# Patient Record
Sex: Female | Born: 1970
Health system: Southern US, Community
[De-identification: ages and names within clinical notes are randomized; demographics above are authoritative.]

## PROBLEM LIST (undated history)

## (undated) DIAGNOSIS — M1990 Unspecified osteoarthritis, unspecified site: Secondary | ICD-10-CM

## (undated) DIAGNOSIS — R519 Headache, unspecified: Secondary | ICD-10-CM

## (undated) DIAGNOSIS — K5792 Diverticulitis of intestine, part unspecified, without perforation or abscess without bleeding: Secondary | ICD-10-CM

## (undated) DIAGNOSIS — N920 Excessive and frequent menstruation with regular cycle: Secondary | ICD-10-CM

## (undated) DIAGNOSIS — D649 Anemia, unspecified: Secondary | ICD-10-CM

## (undated) DIAGNOSIS — M797 Fibromyalgia: Secondary | ICD-10-CM

## (undated) DIAGNOSIS — F17200 Nicotine dependence, unspecified, uncomplicated: Secondary | ICD-10-CM

## (undated) DIAGNOSIS — M940 Chondrocostal junction syndrome [Tietze]: Secondary | ICD-10-CM

## (undated) DIAGNOSIS — I10 Essential (primary) hypertension: Secondary | ICD-10-CM

## (undated) DIAGNOSIS — K219 Gastro-esophageal reflux disease without esophagitis: Secondary | ICD-10-CM

## (undated) DIAGNOSIS — Z8719 Personal history of other diseases of the digestive system: Secondary | ICD-10-CM

## (undated) DIAGNOSIS — L4050 Arthropathic psoriasis, unspecified: Secondary | ICD-10-CM

## (undated) DIAGNOSIS — N946 Dysmenorrhea, unspecified: Secondary | ICD-10-CM

## (undated) DIAGNOSIS — R51 Headache: Secondary | ICD-10-CM

## (undated) HISTORY — DX: Excessive and frequent menstruation with regular cycle: N92.0

## (undated) HISTORY — DX: Diverticulitis of intestine, part unspecified, without perforation or abscess without bleeding: K57.92

## (undated) HISTORY — DX: Unspecified osteoarthritis, unspecified site: M19.90

## (undated) HISTORY — PX: ABDOMINAL HYSTERECTOMY: SHX81

## (undated) HISTORY — DX: Nicotine dependence, unspecified, uncomplicated: F17.200

## (undated) HISTORY — DX: Dysmenorrhea, unspecified: N94.6

## (undated) HISTORY — DX: Chondrocostal junction syndrome (tietze): M94.0

## (undated) HISTORY — DX: Arthropathic psoriasis, unspecified: L40.50

## (undated) HISTORY — DX: Gastro-esophageal reflux disease without esophagitis: K21.9

## (undated) HISTORY — PX: CHOLECYSTECTOMY: SHX55

---

## 1996-05-12 HISTORY — PX: COLONOSCOPY: SHX174

## 1999-09-11 HISTORY — PX: ESOPHAGOGASTRODUODENOSCOPY: SHX1529

## 2001-01-13 ENCOUNTER — Other Ambulatory Visit: Admission: RE | Admit: 2001-01-13 | Discharge: 2001-01-13 | Payer: Self-pay | Admitting: *Deleted

## 2002-05-30 ENCOUNTER — Encounter (INDEPENDENT_AMBULATORY_CARE_PROVIDER_SITE_OTHER): Payer: Self-pay | Admitting: Specialist

## 2002-05-30 ENCOUNTER — Ambulatory Visit (HOSPITAL_BASED_OUTPATIENT_CLINIC_OR_DEPARTMENT_OTHER): Admission: RE | Admit: 2002-05-30 | Discharge: 2002-05-30 | Payer: Self-pay | Admitting: General Surgery

## 2003-04-13 ENCOUNTER — Other Ambulatory Visit: Admission: RE | Admit: 2003-04-13 | Discharge: 2003-04-13 | Payer: Self-pay | Admitting: Obstetrics and Gynecology

## 2003-11-26 ENCOUNTER — Emergency Department (HOSPITAL_COMMUNITY): Admission: EM | Admit: 2003-11-26 | Discharge: 2003-11-26 | Payer: Self-pay | Admitting: Emergency Medicine

## 2004-05-12 HISTORY — PX: CHOLECYSTECTOMY, LAPAROSCOPIC: SHX56

## 2004-08-29 ENCOUNTER — Emergency Department (HOSPITAL_COMMUNITY): Admission: EM | Admit: 2004-08-29 | Discharge: 2004-08-30 | Payer: Self-pay | Admitting: Emergency Medicine

## 2004-09-10 ENCOUNTER — Ambulatory Visit (HOSPITAL_COMMUNITY): Admission: RE | Admit: 2004-09-10 | Discharge: 2004-09-10 | Payer: Self-pay | Admitting: General Surgery

## 2004-09-10 ENCOUNTER — Encounter (INDEPENDENT_AMBULATORY_CARE_PROVIDER_SITE_OTHER): Payer: Self-pay | Admitting: *Deleted

## 2004-11-13 ENCOUNTER — Ambulatory Visit (HOSPITAL_COMMUNITY): Admission: RE | Admit: 2004-11-13 | Discharge: 2004-11-13 | Payer: Self-pay | Admitting: Family Medicine

## 2005-05-13 ENCOUNTER — Ambulatory Visit: Payer: Self-pay | Admitting: Internal Medicine

## 2005-05-20 ENCOUNTER — Ambulatory Visit (HOSPITAL_COMMUNITY): Admission: RE | Admit: 2005-05-20 | Discharge: 2005-05-20 | Payer: Self-pay | Admitting: Internal Medicine

## 2005-10-27 ENCOUNTER — Other Ambulatory Visit: Admission: RE | Admit: 2005-10-27 | Discharge: 2005-10-27 | Payer: Self-pay | Admitting: Obstetrics and Gynecology

## 2007-02-15 ENCOUNTER — Other Ambulatory Visit: Admission: RE | Admit: 2007-02-15 | Discharge: 2007-02-15 | Payer: Self-pay | Admitting: Obstetrics and Gynecology

## 2007-10-26 ENCOUNTER — Ambulatory Visit: Payer: Self-pay | Admitting: Internal Medicine

## 2008-10-03 ENCOUNTER — Encounter: Payer: Self-pay | Admitting: Gastroenterology

## 2009-03-27 LAB — HM PAP SMEAR: HM Pap smear: NEGATIVE

## 2009-08-30 ENCOUNTER — Encounter: Payer: Self-pay | Admitting: Urgent Care

## 2009-12-11 ENCOUNTER — Encounter: Payer: Self-pay | Admitting: Gastroenterology

## 2009-12-11 ENCOUNTER — Ambulatory Visit: Payer: Self-pay | Admitting: Internal Medicine

## 2009-12-11 DIAGNOSIS — K219 Gastro-esophageal reflux disease without esophagitis: Secondary | ICD-10-CM | POA: Insufficient documentation

## 2009-12-11 DIAGNOSIS — R197 Diarrhea, unspecified: Secondary | ICD-10-CM | POA: Insufficient documentation

## 2010-02-10 ENCOUNTER — Emergency Department (HOSPITAL_COMMUNITY): Admission: EM | Admit: 2010-02-10 | Discharge: 2010-02-10 | Payer: Self-pay | Admitting: Emergency Medicine

## 2010-02-12 ENCOUNTER — Ambulatory Visit (HOSPITAL_COMMUNITY): Admission: RE | Admit: 2010-02-12 | Discharge: 2010-02-12 | Payer: Self-pay | Admitting: Orthopaedic Surgery

## 2010-06-11 NOTE — Assessment & Plan Note (Signed)
Summary: fu med refill/jbb   Visit Type:  Follow-up Visit Primary Care Farhad Burleson:   [facility]  Chief Complaint:  Refill on meds.  History of Present Illness: Dawn Hayes is a pleasant 40 y/o WF with h/o chronic GERD who presents for f/u. Last seen in 2009. She is doing well on Nexium. Takes one two times a day most days. If she misses second dose, she has breakthrough symptoms in early-mid am. Denies n/v, abd pain, dysphagia. She c/o intermittent loose stools. Occuring fairly frequently. No constipation. No melena, brbpr. H/O vit D deficiency.     Current Medications (verified): 1)  Nexium 40 Mg Cpdr (Esomeprazole Magnesium) .... One By Mouth 1 To 2 Times Daily (30 Mins Before A Meal) 2)  Clobex 0.05 % Lotn (Clobetasol Propionate) .... As Needed 3)  Ibuprofen 200 Mg Tabs (Ibuprofen) .... As Needed  Allergies: 1)  ! Aspirin  Past History:  Past Medical History: Diverticulitis GERD H/O vit D def EGD 5/01-->mild erosive reflux esophagitis TCS remote Psoriatitic arthritis  Past Surgical History: Cholecystectomy, 06   Family History: Maternal GM, colon cancer, over 19 Father, deceased, brain cancer, prostate ca, lung ca Mother, healthy  Social History: Married. No children. 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 One glass wine or mixed drinks some evenings.  Review of Systems      See HPI  Vital Signs:  Patient profile:   40 year old female Height:      62 inches Weight:      225 pounds BMI:     41.30 Temp:     98.2 degrees F oral Pulse rate:   80 / minute BP sitting:   120 / 88  (left arm) Cuff size:   large  Vitals Entered By: Cloria Spring LPN (December 11, 2009 9:11 AM)  Physical Exam  General:  Well developed, well nourished, no acute distress. Head:  Normocephalic and atraumatic. Eyes:  sclera nonicteric Mouth:  op moist Lungs:  Clear throughout to auscultation. Heart:  Regular rate and rhythm; no murmurs, rubs,  or bruits. Abdomen:  Bowel  sounds normal.  Abdomen is soft, nontender, nondistended.  No rebound or guarding.  No hepatosplenomegaly, masses or hernias.  No abdominal bruits. obese.   Extremities:  No clubbing, cyanosis, edema or deformities noted. Neurologic:  Alert and  oriented x4;  grossly normal neurologically. Skin:  Intact without significant lesions or rashes. Psych:  Alert and cooperative. Normal mood and affect.  Impression & Recommendations:  Problem # 1:  GERD (ICD-530.81) Doing well. Encourage her to try and lose 20 pounds. May be able to get back to once daily dosing if she does. Smoking cessation will help as well. New RX provided. OV in 2 years or sooner if needed. Orders: Est. Patient Level II ([address])  Problem # 2:  DIARRHEA, CHRONIC (ICD-787.91) Likely secondary to IBS vs s/p cholecystectomy. Pt interested in being screened for celiac. Orders: T-igA (60454) T-Tissue Transglutamase Ab IgA (09811-91478) Est. Patient Level II ([address]) Prescriptions: NEXIUM 40 MG CPDR (ESOMEPRAZOLE MAGNESIUM) one by mouth 1 to 2 times daily (30 mins before a meal)  #60 x 11   Entered and Authorized by:   Leanna Battles. Dixon Boos   Signed by:   Leanna Battles Lewis PA-C on 12/11/2009   Method used:   Electronically to        [facility] [address] [phone]* (retail)       [address] [place], [place]  [address]       Ph: [phone]       Fax: [phone]   RxID:   [id number]   Appended Document: fu med refill/jbb REMINDER APPOINTMENT FOR 42YR F/U IN THE COMPUTER

## 2010-06-11 NOTE — Medication Information (Signed)
Summary: PA form nexium  PA form nexium   Imported By: Hendricks Limes LPN 13/12/6576 46:96:29  _____________________________________________________________________  External Attachment:    Type:   Image     Comment:   External Document

## 2010-09-24 NOTE — Assessment & Plan Note (Signed)
NAMEMarland Kitchen  Dawn, Hayes               CHART#:  [id number]   DATE:  10/26/2007                       DOB:  October 16, 1970   CHIEF COMPLAINT:  Followup of acid reflux.   SUBJECTIVE:  The patient is here for followup visit.  She is out of  Nexium and is requesting refill.  She was last seen in January 2007.  Therefore, she was asked to come in for evaluation.  She has a history  of chronic GERD.  She had been doing very well on Nexium 40 mg daily,  except for would have to take an additional dose about 2 times weekly.  Since she has been on her Nexium, she has had a flare-up of her reflux.  She tried Tagamet without result for about 2 weeks.  She has been taking  Pepcid 3 times a day.  She denies any dysphagia, odynophagia, nausea,  vomiting, early satiety, abdominal pain, constipation, diarrhea, melena,  or rectal bleeding.  She denies any caffeine use.  She continues to  smoke half pack of cigarettes daily.  She is exercising, trying to lose  weight, but has not been successful.  She has been working out for about  2 months.  She is trying to follow antireflux measures as discussed with  her today.   The patient inquires about need for colorectal cancer screening.  She  states her maternal grandmother died in her 21s with colon cancer.  She  is not sure if her mother has had any polyps in the past, but has had  colonoscopy.  No first-degree relatives with colonoscopies.  She had no  first-degree relatives who have had colon cancer, however.   CURRENT MEDICATIONS:  1. Nexium 40 mg daily.  2. Clobex lotion daily.  3. Ibuprofen p.r.n., but does not take frequently.   ALLERGIES:  Aspirin.   PHYSICAL EXAMINATION:  VITAL SIGNS:  Weight 217 pounds, height 5 feet 1-  1/2 inches, temp 97.7, blood pressure 104/70, and pulse 76.  GENERAL:  Pleasant, obese Caucasian female in no acute distress.  SKIN:  Warm and dry.  No jaundice.  HEENT:  Sclerae nonicteric.  Oropharyngeal mucosa moist and  pink.  CARDIAC:  Regular rate and rhythm.  ABDOMEN:  Obese, positive bowel sounds, soft, nontender, and  nondistended.  No organomegaly or masses.  No rebound or guarding.   IMPRESSION:  Gastroesophageal reflux disease.  For the most part, well  controlled on Nexium.  She has to take an occasional additional dose  about once to twice a week.  Family history of colorectal cancer in a  second-degree relative.  She is not aware whether or not her mother has  had colon polyps, specifically adenomas.  She will try to get that  information and then we can discuss when she will need to have her  colonoscopy.  She states she has had a prior colonoscopy more than 5  years ago.   PLAN:  1. Nexium 40 mg daily, #30, samples and prescription for 31 with 1      year's refills.  2. The patient requested doing Hemoccult.  3. Stool Hemoccult is provided.  4. She will call when she has more information regarding her family      history of colon polyps or cancer.       Tana Coast, P.A.  Electronically Signed     R. Roetta Sessions, M.D.  Electronically Signed    LL/MEDQ  D:  10/26/2007  T:  10/27/2007  Job:  [id number]

## 2010-09-27 NOTE — Op Note (Signed)
   Dawn Hayes, Dawn Hayes                          ACCOUNT NO.:  [id number]   MEDICAL RECORD NO.:  [id number]                   PATIENT TYPE:  AMB   LOCATION:  DSC                                  FACILITY:  [facility]   PHYSICIAN:  Ollen Gross. Vernell Morgans, M.D.              DATE OF BIRTH:  19-Jul-1970   DATE OF PROCEDURE:  05/30/2002  DATE OF DISCHARGE:                                 OPERATIVE REPORT   PREOPERATIVE DIAGNOSES:  A 1 cm mass on the left lower leg, lateral aspect.   POSTOPERATIVE DIAGNOSES:  A 1 cm mass on the left lower leg, lateral aspect.   OPERATION PERFORMED:  Excision of 1 cm mass from the left leg.   SURGEON:  Ollen Gross. Carolynne Edouard, M.D.   ANESTHESIA:  Local.   DESCRIPTION OF PROCEDURE:  After informed consent was obtained, the patient  was brought to the operating room and placed in supine position on the  operating table.  The area in question was prepped with Betadine and draped  in the usual sterile manner.  The area was then infiltrated with 1%  lidocaine with epinephrine until an adequate field block had been obtained.  A small longitudinal incision was made overlying the mass with a 15 blade  knife.  This incision was carried down into the subcutaneous tissue with  blunt dissection with a hemostat and Metzenbaum scissors until the mass was  identified and completely separated circumferentially from the rest of the  subcutaneous fat.  The mass had an appearance of some fat.  The mass was  removed and sent to pathology for further evaluation.  The area was examined  and found to be hemostatic.  The incision was closed with interrupted 4-0  Monocryl subcuticular stitches.  Benzoin, Steri-Strips and sterile dressings  were applied.  The patient tolerated the procedure well.  At the end of the  case all sponge, needle and instrument counts were correct.  The patient was  awakened and taken to the recovery room in stable condition.             Ollen Gross. Vernell Morgans, M.D.    PST/MEDQ  D:  05/30/2002  T:  05/30/2002  Job:  [id number]

## 2010-09-27 NOTE — Op Note (Signed)
Dawn Hayes              ACCOUNT NO.:  [id number]   MEDICAL RECORD NO.:  [id number]          PATIENT TYPE:  AMB   LOCATION:  DAY                          FACILITY:  [facility]   PHYSICIAN:  Anselm Pancoast. Weatherly, M.D.DATE OF BIRTH:  1971/02/28   DATE OF PROCEDURE:  09/10/2004  DATE OF DISCHARGE:                                 OPERATIVE REPORT   PREOPERATIVE DIAGNOSIS:  Chronic cholecystitis.   POSTOPERATIVE DIAGNOSIS:  Chronic cholecystitis.   OPERATIONS:  Laparoscopic cholecystectomy with cholangiogram.   ANESTHESIA:  General.   SURGEON:  Anselm Pancoast. Zachery Dakins, M.D.   ASSISTANT:  Nurse.   HISTORY:  Dawn Hayes is a 40 year old female who was referred to me  courtesy of Dr. __________ in the emergency room where she presented with  epigastric pain.  She has had a past history of GERD and he obtained an  ultrasound of the gallbladder that did show stones.  She had no previous  evidence of definite acute cholecystitis and on examination her gallbladder  was not edematous.  Her liver tests were slightly elevated, SGOT and SGPT,  but it was thought possibly related to a fatty liver and her common bile  duct measured only about 2.9 mm on exam.  I recommended that we proceed on  with a laparoscopic cholecystectomy and cholangiogram and she was in  agreement.  Her family history is not extremely positive for gallstones.  She did have a grandmother who had gallstones at a late age.   DESCRIPTION OF PROCEDURE:  Preoperatively, the patient was given 3 g of  Unasyn and PAS stockings.  She was positioned on the OR table.  Induction of  general anesthesia with endotracheal tube and oral tube into the stomach.  Then the abdomen was prepped with Betadine solution and draped in a sterile  manner.  A small incision was made below the umbilicus.  The fascia was  identified (she is a little chubby) and picked up between [employer].  A small  opening was carefully made, the underlying  peritoneum identified and went  through carefully with a Tresa Endo.  Then a pursestring suture of 0 Vicryl was  placed.  The Hasson cannula was introduced.  The pursestring sutures were in  good position.  The peritoneal cavity was filled with carbon dioxide.  The  gallbladder was not acutely inflamed, kind of a long, skinny gallbladder,  but a little bit tense.  The upper 10 mm trocar was placed in the subxiphoid  area under direct vision __________ fascia.  Two lateral 5 mm trocars were  placed and then the gallbladder was grasped with a million dollar grasper.  Then the proximal portion of the gallbladder was grasped.  Then the  peritoneum was kind of carefully dissected down off of the gallbladder  proximal portion.  The cystic duct and cystic artery were both identified.  The clip had been placed flush with the gallbladder on the cystic duct and a  small opening was made.  The [facility] catheter was introduced and held in place  with a clip.  The x-ray was obtained, which showed good prompt  filling of  the extrahepatic biliary system with good flow into the duodenum and the  catheter was removed.  The cystic duct proximal was triply clipped and  divided.  The cystic artery was doubly clipped proximally, singly distal and  divided.  Then the gallbladder was freed from its bed with a hook  electrocautery.  There was just a little bit of bile spillage from where we  had grasped the proximal portion of the gallbladder and we placed the  gallbladder in an EndoCatch bag at the completion of surgery.  Irrigating  fluid was used in the gallbladder bed.  Good hemostasis.  Then the camera  was switched to the upper 10 mm port and the bag containing the gallbladder  brought through the umbilical fascia.  I placed an additional figure-of-  eight suture of 0 Vicryl in the fascia of the umbilicus and anesthetized the  fascia in the umbilicus.  The subcutaneous tissue was closed with 4-0  Vicryl.  We had  removed the irrigating fluid and Steri-Strips and benzoin on  the skin.  There was a little redness at the Bovie site when they pulled the  pad off and that will be checked in the recovery room.  The patient was sent  to the recovery room in stable postoperative condition and she may be  released this afternoon if she desires.      WJW/MEDQ  D:  09/10/2004  T:  09/10/2004  Job:  [id number]

## 2010-12-16 ENCOUNTER — Other Ambulatory Visit: Payer: Self-pay | Admitting: Gastroenterology

## 2011-01-08 ENCOUNTER — Other Ambulatory Visit: Payer: Self-pay | Admitting: Obstetrics and Gynecology

## 2011-01-08 DIAGNOSIS — Z1231 Encounter for screening mammogram for malignant neoplasm of breast: Secondary | ICD-10-CM

## 2011-03-19 ENCOUNTER — Ambulatory Visit: Payer: Self-pay

## 2011-04-07 ENCOUNTER — Ambulatory Visit: Payer: Self-pay

## 2011-04-24 ENCOUNTER — Ambulatory Visit
Admission: RE | Admit: 2011-04-24 | Discharge: 2011-04-24 | Disposition: A | Discharge status: Home / Self Care | Payer: 59 | Source: Ambulatory Visit | Attending: Obstetrics and Gynecology | Admitting: Obstetrics and Gynecology

## 2011-04-24 DIAGNOSIS — Z1231 Encounter for screening mammogram for malignant neoplasm of breast: Secondary | ICD-10-CM

## 2011-11-25 ENCOUNTER — Encounter: Payer: Self-pay | Admitting: Gastroenterology

## 2011-12-23 ENCOUNTER — Other Ambulatory Visit: Payer: Self-pay

## 2011-12-24 MED ORDER — ESOMEPRAZOLE MAGNESIUM 40 MG PO CPDR: 40.0000 mg | DELAYED_RELEASE_CAPSULE | Freq: Every day | ORAL | Status: DC

## 2012-01-21 ENCOUNTER — Telehealth: Payer: Self-pay | Admitting: *Deleted

## 2012-01-21 NOTE — Telephone Encounter (Signed)
Dawn Hayes just sent 5 refills on Nexium to [facility] in August. Pt is due for 2 year follow up. Please schedule.

## 2012-01-21 NOTE — Telephone Encounter (Signed)
Ms Dawn Hayes called today. She needs refills on her nexium, however it has been a long time since her last O/V. Please follow up. Thanks.

## 2012-01-21 NOTE — Telephone Encounter (Signed)
appt made for next monday

## 2012-01-23 ENCOUNTER — Encounter: Payer: Self-pay | Admitting: Internal Medicine

## 2012-01-26 ENCOUNTER — Encounter: Payer: Self-pay | Admitting: Urgent Care

## 2012-01-26 ENCOUNTER — Ambulatory Visit (INDEPENDENT_AMBULATORY_CARE_PROVIDER_SITE_OTHER): Payer: 59 | Admitting: Urgent Care

## 2012-01-26 VITALS — BP 117/77 | HR 91 | Temp 97.4°F | Ht 62.0 in | Wt 247.2 lb

## 2012-01-26 DIAGNOSIS — E669 Obesity, unspecified: Secondary | ICD-10-CM

## 2012-01-26 DIAGNOSIS — K219 Gastro-esophageal reflux disease without esophagitis: Secondary | ICD-10-CM

## 2012-01-26 NOTE — Assessment & Plan Note (Addendum)
Well-controlled on nexium 40mg  daily Urged weight loss

## 2012-01-26 NOTE — Patient Instructions (Addendum)
Nexium 40mg  daily for acid reflux 1-800-QUIT-NOW for help quitting smoki Be sure to get routine annual checkups with your doctor Recommend 1-2# weight loss per week until ideal body weight through exercise & diet. Low fat/cholesterol diet. Gradually increase exercise from 15 min daily up to 1 hr per day 5 days/week. Limit alcohol use.

## 2012-01-26 NOTE — Progress Notes (Signed)
Faxed to PCP

## 2012-01-26 NOTE — Assessment & Plan Note (Signed)
1-800-QUIT-NOW for help quitting smoking Be sure to get routine annual checkups with your doctor Recommend 1-2# weight loss per week until ideal body weight through exercise & diet. Low fat/cholesterol diet. Gradually increase exercise from 15 min daily up to 1 hr per day 5 days/week. Limit alcohol use.

## 2012-01-26 NOTE — Progress Notes (Signed)
Primary Care Physician:  Mady Gemma, [place] Primary Gastroenterologist:  Dr. Jena Gauss    Chief Complaint  Patient presents with  . Follow-up  . Medication Refill    HPI:  Dawn Hayes is a 41 y.o. female here for follow up for GERD.  She has not been seen in 2 years.  Doing very well on nexium 40mg  daily.   She has tried to stop nexium, but after 1-2 days she has symptoms even if she only drinks water.  Denies any upper GI symptoms including heartburn, indigestion, nausea, vomiting, dysphagia, odynophagia or anorexia.   Denies any lower GI symptoms including constipation, diarrhea, rectal bleeding, melena or weight loss.  Weight up 30# in past 4 years.  Past Medical History  Diagnosis Date  . Diverticulitis   . GERD (gastroesophageal reflux disease)   . Psoriatic arthritis     Past Surgical History  Procedure Date  . Cholecystectomy 2006  . Esophagogastroduodenoscopy 09/11/99    tiny esophageal erosions with mild erosive reflux/no barrett's/normal stomach  . Colonoscopy 1998    normal, [place]    Current Outpatient Prescriptions  Medication Sig Dispense Refill  . esomeprazole (NEXIUM) 40 MG capsule Take 1 capsule (40 mg total) by mouth daily before breakfast.  30 capsule  5    Allergies as of 01/26/2012 - reviewed 12/11/2009  Allergen Reaction Noted  . Aspirin Nausea Only 12/06/2009    Review of Systems: Gen: Denies any fever, chills, sweats, anorexia, fatigue, weakness, malaise, weight loss, and sleep disorder CV: Denies chest pain, angina, palpitations, syncope, orthopnea, PND, peripheral edema, and claudication. Resp: Denies dyspnea at rest, dyspnea with exercise, cough, sputum, wheezing, coughing up blood, and pleurisy. GI: Denies vomiting blood, jaundice, and fecal incontinence.   Denies dysphagia or odynophagia. Derm: Denies rash, itching, dry skin, hives, moles, warts, or unhealing ulcers.  Psych: Denies depression, anxiety, memory loss, suicidal ideation,  hallucinations, paranoia, and confusion. Heme: Denies bruising, bleeding, and enlarged lymph nodes.  Physical Exam: BP 117/77  Pulse 91  Temp 97.4 F (36.3 C) (Temporal)  Ht 5\' 2"  (1.575 m)  Wt 247 lb 3.2 oz (112.129 kg)  BMI 45.21 kg/m2  LMP 12/31/2011 General:   Alert,  Well-developed, obese, pleasant and cooperative in NAD Eyes:  Sclera clear, no icterus.   Conjunctiva pink. Mouth:  No deformity or lesions, oropharynx pink and moist. Neck:  Supple; no masses or thyromegaly. Heart:  Regular rate and rhythm; no murmurs, clicks, rubs,  or gallops. Abdomen:  Normal bowel sounds.  No bruits.  Soft, non-tender and non-distended without masses, hepatosplenomegaly or hernias noted.  No guarding or rebound tenderness.  Exam limited given body habitus. Rectal:  Deferred.  Msk:  Symmetrical without gross deformities.  Pulses:  Normal pulses noted. Extremities:  No clubbing or edema. Neurologic:  Alert and oriented x4;  grossly normal neurologically. Skin:  Intact without significant lesions or rashes.

## 2012-03-15 ENCOUNTER — Telehealth: Payer: Self-pay | Admitting: *Deleted

## 2012-03-15 MED ORDER — ESOMEPRAZOLE MAGNESIUM 40 MG PO CPDR: 40.0000 mg | DELAYED_RELEASE_CAPSULE | Freq: Two times a day (BID) | ORAL | Status: DC

## 2012-03-15 NOTE — Telephone Encounter (Signed)
Dawn Hayes called today. She believes there was an error on her refill for her nexiums and needs another order sent to her pharmacy. She should have a prescription that reads 40mg  bid, and it currently reads 40 mg qd. Thanks.

## 2012-03-15 NOTE — Telephone Encounter (Signed)
Done

## 2012-03-15 NOTE — Addendum Note (Signed)
Addended by: Nira Retort on: 03/15/2012 03:27 PM   Modules accepted: Orders

## 2012-03-15 NOTE — Telephone Encounter (Signed)
Routed to refill box 

## 2012-07-21 ENCOUNTER — Ambulatory Visit: Payer: 59 | Admitting: Family Medicine

## 2012-11-19 ENCOUNTER — Encounter: Payer: Self-pay | Admitting: *Deleted

## 2012-11-24 ENCOUNTER — Ambulatory Visit: Payer: Self-pay | Admitting: Nurse Practitioner

## 2012-12-13 ENCOUNTER — Other Ambulatory Visit: Payer: Self-pay | Admitting: Gastroenterology

## 2012-12-14 ENCOUNTER — Ambulatory Visit: Payer: Self-pay | Admitting: Nurse Practitioner

## 2012-12-27 ENCOUNTER — Encounter: Payer: Self-pay | Admitting: Nurse Practitioner

## 2012-12-27 ENCOUNTER — Ambulatory Visit (INDEPENDENT_AMBULATORY_CARE_PROVIDER_SITE_OTHER): Payer: 59 | Admitting: Nurse Practitioner

## 2012-12-27 VITALS — BP 128/74 | HR 68 | Resp 14 | Ht 62.5 in | Wt 258.8 lb

## 2012-12-27 DIAGNOSIS — Z0000 Encounter for general adult medical examination without abnormal findings: Secondary | ICD-10-CM

## 2012-12-27 DIAGNOSIS — Z01419 Encounter for gynecological examination (general) (routine) without abnormal findings: Secondary | ICD-10-CM

## 2012-12-27 LAB — POCT URINALYSIS DIPSTICK
Leukocytes, UA: NEGATIVE
Urobilinogen, UA: NEGATIVE
pH, UA: 5.5

## 2012-12-27 NOTE — Patient Instructions (Signed)

## 2012-12-27 NOTE — Progress Notes (Signed)
42 y.o. G0P0 Married Caucasian Fe here for annual exam.  No new health issues Psoriasis flare currently - followed by dermatologist.  Patient's last menstrual period was 12/23/2012.          Sexually active: yes  The current method of family planning is none. Possible infertility, no BC in 12 years.   Exercising: no  The patient does not participate in regular exercise at present. Smoker:  no  Health Maintenance: Pap:  02/24/2009  negative MMG:  04/24/2011 Colonoscopy:  1998 normal BMD; 2008 normal TDaP:  2013  Labs: Hgb- 12.6 PCP is managing labs.    reports that she has quit smoking. Her smoking use included Cigarettes. She has a 10 pack-year smoking history. She has never used smokeless tobacco. She reports that she drinks about 5.0 ounces of alcohol per week. She reports that she does not use illicit drugs.  Past Medical History  Diagnosis Date  . Diverticulitis   . GERD (gastroesophageal reflux disease)   . Psoriatic arthritis   . Dysmenorrhea   . Menorrhagia   . Smoker     Past Surgical History  Procedure Laterality Date  . Cholecystectomy  2006  . Esophagogastroduodenoscopy  09/11/99    tiny esophageal erosions with mild erosive reflux/no barrett's/normal stomach  . Colonoscopy  1998    normal, [place]    Current Outpatient Prescriptions  Medication Sig Dispense Refill  . cholecalciferol (VITAMIN D) 1000 UNITS tablet Take 1,000 Units by mouth 2 (two) times daily.      . hydrochlorothiazide (HYDRODIURIL) 25 MG tablet Take 25 tablets by mouth.      Marland Kitchen lisinopril-hydrochlorothiazide (PRINZIDE,ZESTORETIC) 10-12.5 MG per tablet Take 12.5 tablets by mouth.      Marland Kitchen NEXIUM 40 MG capsule TAKE ONE CAPSULE BY MOUTH TWICE DAILY  60 capsule  3  . thiamine (VITAMIN B-1) 100 MG tablet Take 100 mg by mouth daily.       No current facility-administered medications for this visit.    Family History  Problem Relation Age of Onset  . Colon cancer Maternal Grandmother 51  . Brain  cancer Father   . Prostate cancer Father     ROS:  Pertinent items are noted in HPI.  Otherwise, a comprehensive ROS was negative.  Exam:   BP 128/74  Pulse 68  Resp 14  Ht 5' 2.5" (1.588 m)  Wt 258 lb 12.8 oz (117.391 kg)  BMI 46.55 kg/m2  LMP 12/23/2012 Height: 5' 2.5" (158.8 cm)  Ht Readings from Last 3 Encounters:  12/27/12 5' 2.5" (1.588 m)  01/26/12 5\' 2"  (1.575 m)  12/11/09 5\' 2"  (1.575 m)    General appearance: alert, cooperative and appears stated age Head: Normocephalic, without obvious abnormality, atraumatic Neck: no adenopathy, supple, symmetrical, trachea midline and thyroid normal to inspection and palpation Lungs: clear to auscultation bilaterally Breasts: normal appearance, no masses or tenderness Heart: regular rate and rhythm Abdomen: soft, non-tender; no masses,  no organomegaly Extremities: extremities normal, atraumatic, no cyanosis or edema Skin: Skin color, texture, turgor normal. No rashes or lesions Lymph nodes: Cervical, supraclavicular, and axillary nodes normal. No abnormal inguinal nodes palpated Neurologic: Grossly normal   Pelvic: External genitalia:  no lesions              Urethra:  normal appearing urethra with no masses, tenderness or lesions              Bartholin's and Skene's: normal  Vagina: normal appearing vagina with normal color and discharge, no lesions              Cervix: cervical motion tenderness              Pap taken: yes Bimanual Exam:  Uterus:  normal size, contour, position, consistency, mobility, non-tender              Adnexa: no mass, fullness, tenderness               Rectovaginal: Confirms               Anus:  normal sphincter tone, no lesions  A:  Well Woman with normal exam  P:   Pap smear as per guidelines  Schedule Mammogram   Counseled on breast self exam, adequate intake of calcium and vitamin D, diet and exercise return annually or prn  An After Visit Summary was printed and given to  the patient.

## 2012-12-28 NOTE — Progress Notes (Signed)
Encounter reviewed by Dr. Brook Silva.  

## 2013-03-03 ENCOUNTER — Telehealth: Payer: Self-pay | Admitting: *Deleted

## 2013-03-03 ENCOUNTER — Encounter: Payer: Self-pay | Admitting: *Deleted

## 2013-03-03 MED ORDER — TERBINAFINE HCL 250 MG PO TABS: 250.0000 mg | ORAL_TABLET | Freq: Every day | ORAL | Status: DC

## 2013-03-03 NOTE — Telephone Encounter (Signed)
Pt states lost Lamisil for 3 month worth.   Returned call informed pt I would send rx to [facility] in [place].

## 2013-03-17 ENCOUNTER — Other Ambulatory Visit: Payer: Self-pay

## 2013-05-25 ENCOUNTER — Other Ambulatory Visit: Payer: Self-pay | Admitting: Family Medicine

## 2013-05-25 ENCOUNTER — Ambulatory Visit
Admission: RE | Admit: 2013-05-25 | Discharge: 2013-05-25 | Disposition: A | Discharge status: Home / Self Care | Payer: 59 | Source: Ambulatory Visit | Attending: Family Medicine | Admitting: Family Medicine

## 2013-05-25 DIAGNOSIS — R197 Diarrhea, unspecified: Secondary | ICD-10-CM

## 2013-05-25 DIAGNOSIS — R112 Nausea with vomiting, unspecified: Secondary | ICD-10-CM

## 2013-06-14 ENCOUNTER — Other Ambulatory Visit: Payer: Self-pay | Admitting: Gastroenterology

## 2013-08-19 ENCOUNTER — Ambulatory Visit: Payer: 59 | Admitting: Internal Medicine

## 2013-09-16 ENCOUNTER — Encounter: Payer: Self-pay | Admitting: Internal Medicine

## 2013-09-16 ENCOUNTER — Ambulatory Visit (INDEPENDENT_AMBULATORY_CARE_PROVIDER_SITE_OTHER): Payer: 59 | Admitting: Internal Medicine

## 2013-09-16 VITALS — BP 130/72 | HR 98 | Ht 62.0 in | Wt 256.9 lb

## 2013-09-16 DIAGNOSIS — L4050 Arthropathic psoriasis, unspecified: Secondary | ICD-10-CM | POA: Insufficient documentation

## 2013-09-16 DIAGNOSIS — Z87891 Personal history of nicotine dependence: Secondary | ICD-10-CM

## 2013-09-16 DIAGNOSIS — L409 Psoriasis, unspecified: Secondary | ICD-10-CM

## 2013-09-16 DIAGNOSIS — I10 Essential (primary) hypertension: Secondary | ICD-10-CM

## 2013-09-16 DIAGNOSIS — R9431 Abnormal electrocardiogram [ECG] [EKG]: Secondary | ICD-10-CM

## 2013-09-16 DIAGNOSIS — R079 Chest pain, unspecified: Secondary | ICD-10-CM

## 2013-09-16 DIAGNOSIS — L408 Other psoriasis: Secondary | ICD-10-CM

## 2013-09-16 NOTE — Progress Notes (Signed)
OFFICE NOTE  Chief Complaint:  Chest pain  Primary Care Physician: Tula Nakayama  HPI:  Dawn Hayes is a pleasant 43 year old female who is coming referred to me for evaluation of chest pain. Dawn Hayes described the onset of acute chest pain which awoke her from sleep in March of this year. She reported worsening pain with inspiration as well as lying flat and work hard her to sit up in a chair for 5 days until her symptoms improved. She felt some heaviness in her chest as well as pain between her shoulder blades in the back of her neck. The symptoms were unlike her reflux symptoms. She does have a history of arthritis which is psoriatic as well as recent psoriasis flares. She reports she was previously on methotrexate but no longer takes that. She is thinking about seeing a rheumatologist to get on Enbrel. She does has a history of hypertension as well as she was a former smoker with a 10 pack year smoking history but quit recently. This led to weight gain and some worsening fatigue. There is no significant family history of coronary disease. An EKG performed in the office today shows poor anterior R-wave regression which is concerning for possible remote anterolateral infarct.  PMHx:  Past Medical History  Diagnosis Date  . Diverticulitis   . GERD (gastroesophageal reflux disease)   . Psoriatic arthritis   . Dysmenorrhea   . Menorrhagia   . Smoker     quit 111/2013 after 20 years    Past Surgical History  Procedure Laterality Date  . Esophagogastroduodenoscopy  09/11/99    tiny esophageal erosions with mild erosive reflux/no barrett's/normal stomach  . Colonoscopy  1998    normal, [place]  . Cholecystectomy, laparoscopic  2006    FAMHx:  Family History  Problem Relation Age of Onset  . Colon cancer Maternal Grandmother 51  . Brain cancer Father   . Prostate cancer Father   . Hyperlipidemia Mother   . Stroke Paternal Grandfather     COD    SOCHx:   reports that she quit smoking about 18 months ago. Her smoking use included Cigarettes. She has a 10 pack-year smoking history. She has never used smokeless tobacco. She reports that she drinks about 5 ounces of alcohol per week. She reports that she does not use illicit drugs.  ALLERGIES:  Allergies  Allergen Reactions  . Aspirin Nausea Only    ROS: A comprehensive review of systems was negative except for: Respiratory: positive for dyspnea on exertion Cardiovascular: positive for chest pain Integument/breast: positive for rash Musculoskeletal: positive for arthralgias  HOME MEDS: Current Outpatient Prescriptions  Medication Sig Dispense Refill  . cholecalciferol (VITAMIN D) 1000 UNITS tablet Take 5,000 Units by mouth daily.       Marland Kitchen esomeprazole (NEXIUM) 40 MG capsule TAKE ONE CAPSULE BY MOUTH ONCE DAILY      . hydrochlorothiazide (HYDRODIURIL) 25 MG tablet Take 25 tablets by mouth.      Marland Kitchen lisinopril (PRINIVIL,ZESTRIL) 10 MG tablet Take 10 mg by mouth daily.      Marland Kitchen MAGNESIUM PO Take by mouth. Occasionally      . TACLONEX external suspension as needed.      . terbinafine (LAMISIL) 250 MG tablet Take 1 tablet (250 mg total) by mouth daily.  90 tablet  0   No current facility-administered medications for this visit.    LABS/IMAGING: No results found for this or any previous visit (from the past 48 hour(s)).  No results found.  VITALS: BP 130/72  Pulse 98  Ht 5\' 2"  (1.575 m)  Wt 256 lb 14.4 oz (116.529 kg)  BMI 46.98 kg/m2  EXAM: General appearance: alert and no distress Neck: no carotid bruit and no JVD Lungs: clear to auscultation bilaterally Heart: regular rate and rhythm, S1, S2 normal, no murmur, click, rub or gallop Abdomen: soft, non-tender; bowel sounds normal; no masses,  no organomegaly and obese Extremities: extremities normal, atraumatic, no cyanosis or edema Pulses: 2+ and symmetric Skin: Skin color, texture, turgor normal. No rashes or lesions Neurologic:  Grossly normal Psych: Mood, affect normal  EKG: Normal sinus rhythm at 99, poor R wave progression anteriorly, anterior MI cannot be ruled out  ASSESSMENT: 1. Chest pain 2. Abnormal EKG concerning for possible prior infarct 3. Psoriatic arthritis/psoriasis 4. Hypertension 5. Morbid obesity 6. Former smoker  PLAN: 1.   Dawn Hayes has multiple cardiac risk factors, the biggest of which is psoriasis/psoriatic arthritis, which carries a relative risk of coronary disease that is 7 times the normal population. Her episode of chest pain awoke her at night and cause her discomfort when lying down for several days. I'm concerned this could have represented an MI. I recommended LexiScan nuclear stress test to further evaluate for coronary ischemia given her numerous risk factors and shortness of breath with minimal exercise. Plan to see her back to discuss results in a few weeks. In the meantime if she has further chest pain I instructed her to proceed to emergency room.   Thank you again for the kind referral.  Pixie Casino, MD, [employer] Attending Cardiologist [place] 09/16/2013, 4:15 PM

## 2013-09-16 NOTE — Patient Instructions (Signed)
Your physician has requested that you have a lexiscan myoview. For further information please visit [email or web address]. Please follow instruction sheet, as given.  Your physician recommends that you schedule a follow-up appointment in: 1 month - after your stress test.

## 2013-09-19 ENCOUNTER — Telehealth: Payer: Self-pay | Admitting: Internal Medicine

## 2013-09-19 NOTE — Telephone Encounter (Signed)
Forwarded to Dr. Hilty for review 

## 2013-09-19 NOTE — Telephone Encounter (Signed)
I removed that from her visit diagnoses. It was an error. Please let her know.  Dr. Lemmie Evens

## 2013-09-19 NOTE — Telephone Encounter (Signed)
Patient states that she was looking at her AVS and it states that she has diabetes.   Shesays she does not.  Can we adjust her papwrwork?

## 2013-09-19 NOTE — Telephone Encounter (Signed)
Informed patient per Dr. Debara Pickett the diagnosis was in error and he has already removed it.

## 2013-09-23 ENCOUNTER — Telehealth (HOSPITAL_COMMUNITY): Payer: Self-pay

## 2013-09-26 ENCOUNTER — Emergency Department (HOSPITAL_COMMUNITY): Payer: 59

## 2013-09-26 ENCOUNTER — Telehealth: Payer: Self-pay | Admitting: Internal Medicine

## 2013-09-26 ENCOUNTER — Encounter (HOSPITAL_COMMUNITY): Payer: Self-pay | Admitting: Emergency Medicine

## 2013-09-26 ENCOUNTER — Inpatient Hospital Stay (HOSPITAL_COMMUNITY)
Admission: EM | Admit: 2013-09-26 | Discharge: 2013-09-27 | DRG: 313 | Disposition: A | Discharge status: Home / Self Care | Payer: 59 | Attending: Cardiology | Admitting: Cardiology

## 2013-09-26 DIAGNOSIS — Z808 Family history of malignant neoplasm of other organs or systems: Secondary | ICD-10-CM

## 2013-09-26 DIAGNOSIS — Z823 Family history of stroke: Secondary | ICD-10-CM

## 2013-09-26 DIAGNOSIS — L4050 Arthropathic psoriasis, unspecified: Secondary | ICD-10-CM | POA: Diagnosis present

## 2013-09-26 DIAGNOSIS — R0609 Other forms of dyspnea: Secondary | ICD-10-CM

## 2013-09-26 DIAGNOSIS — Z87891 Personal history of nicotine dependence: Secondary | ICD-10-CM

## 2013-09-26 DIAGNOSIS — Z6841 Body Mass Index (BMI) 40.0 and over, adult: Secondary | ICD-10-CM

## 2013-09-26 DIAGNOSIS — R079 Chest pain, unspecified: Principal | ICD-10-CM | POA: Diagnosis present

## 2013-09-26 DIAGNOSIS — L409 Psoriasis, unspecified: Secondary | ICD-10-CM | POA: Diagnosis present

## 2013-09-26 DIAGNOSIS — I10 Essential (primary) hypertension: Secondary | ICD-10-CM | POA: Diagnosis present

## 2013-09-26 DIAGNOSIS — Z800 Family history of malignant neoplasm of digestive organs: Secondary | ICD-10-CM

## 2013-09-26 DIAGNOSIS — R0989 Other specified symptoms and signs involving the circulatory and respiratory systems: Secondary | ICD-10-CM

## 2013-09-26 DIAGNOSIS — R9431 Abnormal electrocardiogram [ECG] [EKG]: Secondary | ICD-10-CM | POA: Diagnosis present

## 2013-09-26 HISTORY — DX: Essential (primary) hypertension: I10

## 2013-09-26 LAB — COMPREHENSIVE METABOLIC PANEL
ALBUMIN: 3.6 g/dL (ref 3.5–5.2)
ALK PHOS: 101 U/L (ref 39–117)
ALT: 26 U/L (ref 0–35)
AST: 21 U/L (ref 0–37)
BUN: 11 mg/dL (ref 6–23)
CO2: 25 mEq/L (ref 19–32)
Calcium: 9.6 mg/dL (ref 8.4–10.5)
Chloride: 96 mEq/L (ref 96–112)
Creatinine, Ser: 0.59 mg/dL (ref 0.50–1.10)
GFR calc Af Amer: >90 mL/min (ref >90)
GFR calc non Af Amer: >90 mL/min (ref >90)
Glucose, Bld: 99 mg/dL (ref 70–99)
POTASSIUM: 3.6 meq/L — AB (ref 3.7–5.3)
SODIUM: 136 meq/L — AB (ref 137–147)
Total Bilirubin: 0.4 mg/dL (ref 0.3–1.2)
Total Protein: 7.8 g/dL (ref 6.0–8.3)

## 2013-09-26 LAB — URINALYSIS, ROUTINE W REFLEX MICROSCOPIC
Bilirubin Urine: NEGATIVE
Glucose, UA: NEGATIVE mg/dL
HGB URINE DIPSTICK: NEGATIVE
Ketones, ur: NEGATIVE mg/dL
LEUKOCYTES UA: NEGATIVE
Nitrite: NEGATIVE
Protein, ur: NEGATIVE mg/dL
Specific Gravity, Urine: 1.009 (ref 1.005–1.030)
Urobilinogen, UA: 0.2 mg/dL (ref 0.0–1.0)
pH: 6 (ref 5.0–8.0)

## 2013-09-26 LAB — CBC
HCT: 35.1 % — ABNORMAL LOW (ref 36.0–46.0)
Hemoglobin: 11.4 g/dL — ABNORMAL LOW (ref 12.0–15.0)
MCH: 28.9 pg (ref 26.0–34.0)
MCHC: 32.5 g/dL (ref 30.0–36.0)
MCV: 89.1 fL (ref 78.0–100.0)
Platelets: 286 10*3/uL (ref 150–400)
RBC: 3.94 MIL/uL (ref 3.87–5.11)
RDW: 14.8 % (ref 11.5–15.5)
WBC: 9 10*3/uL (ref 4.0–10.5)

## 2013-09-26 LAB — I-STAT TROPONIN, ED
TROPONIN I, POC: 0 ng/mL (ref 0.00–0.08)
TROPONIN I, POC: 0 ng/mL (ref 0.00–0.08)

## 2013-09-26 LAB — MRSA PCR SCREENING: MRSA BY PCR: NEGATIVE

## 2013-09-26 LAB — D-DIMER, QUANTITATIVE: D-Dimer, Quant: 0.62 ug/mL-FEU — ABNORMAL HIGH (ref 0.00–0.48)

## 2013-09-26 MED ORDER — LISINOPRIL 10 MG PO TABS
10.0000 mg | ORAL_TABLET | Freq: Every day | ORAL | Status: DC
  Administered 2013-09-26 – 2013-09-27 (×2): 10 mg via ORAL
  Filled 2013-09-26 (×2): qty 1

## 2013-09-26 MED ORDER — ASPIRIN EC 81 MG PO TBEC
81.0000 mg | DELAYED_RELEASE_TABLET | Freq: Every day | ORAL | Status: DC
Start: 2013-09-27 — End: 2013-09-27
  Administered 2013-09-27: 81 mg via ORAL
  Filled 2013-09-26: qty 1

## 2013-09-26 MED ORDER — HEPARIN BOLUS VIA INFUSION
4000.0000 [IU] | Freq: Once | INTRAVENOUS | Status: AC
  Administered 2013-09-26: 4000 [IU] via INTRAVENOUS
  Filled 2013-09-26: qty 4000

## 2013-09-26 MED ORDER — HEPARIN (PORCINE) IN NACL 100-0.45 UNIT/ML-% IJ SOLN
1500.0000 [IU]/h | INTRAMUSCULAR | Status: DC
  Administered 2013-09-26: 1200 [IU]/h via INTRAVENOUS
  Administered 2013-09-27 (×2): 1500 [IU]/h via INTRAVENOUS
  Filled 2013-09-26 (×3): qty 250

## 2013-09-26 MED ORDER — PANTOPRAZOLE SODIUM 40 MG PO TBEC
40.0000 mg | DELAYED_RELEASE_TABLET | Freq: Every day | ORAL | Status: DC
  Administered 2013-09-26 – 2013-09-27 (×2): 40 mg via ORAL
  Filled 2013-09-26 (×2): qty 1

## 2013-09-26 MED ORDER — SODIUM CHLORIDE 0.9 % IJ SOLN
3.0000 mL | Freq: Two times a day (BID) | INTRAMUSCULAR | Status: DC
  Administered 2013-09-26 – 2013-09-27 (×2): 3 mL via INTRAVENOUS

## 2013-09-26 MED ORDER — NITROGLYCERIN 0.4 MG SL SUBL
0.4000 mg | SUBLINGUAL_TABLET | SUBLINGUAL | Status: DC | PRN
  Administered 2013-09-26 (×2): 0.4 mg via SUBLINGUAL
  Filled 2013-09-26: qty 1

## 2013-09-26 MED ORDER — SODIUM CHLORIDE 0.9 % IV SOLN: 250.0000 mL | INTRAVENOUS | Status: DC | PRN

## 2013-09-26 MED ORDER — IOHEXOL 350 MG/ML SOLN
100.0000 mL | Freq: Once | INTRAVENOUS | Status: AC | PRN
  Administered 2013-09-26: 100 mL via INTRAVENOUS

## 2013-09-26 MED ORDER — CALCIPOTRIENE-BETAMETH DIPROP 0.005-0.064 % EX SUSP: 1.0000 "application " | CUTANEOUS | Status: DC | PRN

## 2013-09-26 MED ORDER — SODIUM CHLORIDE 0.9 % IJ SOLN: 3.0000 mL | INTRAMUSCULAR | Status: DC | PRN

## 2013-09-26 MED ORDER — NITROGLYCERIN IN D5W 200-5 MCG/ML-% IV SOLN
3.0000 ug/min | INTRAVENOUS | Status: DC
  Administered 2013-09-26: 10 ug/min via INTRAVENOUS
  Filled 2013-09-26: qty 250

## 2013-09-26 MED ORDER — ASPIRIN 81 MG PO CHEW: 324.0000 mg | CHEWABLE_TABLET | ORAL | Status: DC

## 2013-09-26 MED ORDER — HYDROCHLOROTHIAZIDE 25 MG PO TABS
25.0000 mg | ORAL_TABLET | Freq: Every day | ORAL | Status: DC
  Administered 2013-09-26 – 2013-09-27 (×2): 25 mg via ORAL
  Filled 2013-09-26 (×2): qty 1

## 2013-09-26 MED ORDER — ASPIRIN 81 MG PO CHEW
324.0000 mg | CHEWABLE_TABLET | Freq: Once | ORAL | Status: AC
  Administered 2013-09-26: 324 mg via ORAL
  Filled 2013-09-26: qty 4

## 2013-09-26 MED ORDER — ASPIRIN 300 MG RE SUPP: 300.0000 mg | RECTAL | Status: DC

## 2013-09-26 NOTE — Telephone Encounter (Signed)
Starting having a on Saturday starting with chest pains and shoulder pain on right side .... and when  Laying down flat cannot breathe.. Please call    Thanks

## 2013-09-26 NOTE — Telephone Encounter (Signed)
Patient went to [facility] ER.

## 2013-09-26 NOTE — ED Provider Notes (Signed)
CSN: [id number]     Arrival date & time 09/26/13  1200 History   First MD Initiated Contact with Patient 09/26/13 1619     Chief Complaint  Patient presents with  . Chest Pain  . Shortness of Breath     (Consider location/radiation/quality/duration/timing/severity/associated sxs/prior Treatment) HPI Comments: Patient with a history of HTN and Psoriatic Arthritis presents with a chief complaint of chest pain and SOB.  She reports that the pain has been a constant substernal pressure over the past 2 days.  Pain gradually worsening.  Pain radiates to her neck and right shoulder.  Pain associated with minimal SOB.  She reports that the pain is worse when she is lying down flat.  Nothing makes the pain better.  She denies nausea, vomiting, numbness, tingling, abdominal pain, or diaphoresis.  Patient was evaluated by Dr. Debara Pickett with Cardiology on 09-16-13.  He scheduled her for a nuclear stress test on 09-29-13.  She denies any known history of CAD.  She is a former smoker, but does not currently smoke.  She denies history of DM or Hyperlipidemia.  She denies history of PE or DVT.  No prolonged travel or surgeries in the past 4 weeks.  No unilateral lower extremity edema.  She is currently not on any estrogen containing medications.  Patient is a 43 y.o. female presenting with chest pain and shortness of breath. The history is provided by the patient.  Chest Pain Associated symptoms: shortness of breath   Shortness of Breath Associated symptoms: chest pain     Past Medical History  Diagnosis Date  . Diverticulitis   . GERD (gastroesophageal reflux disease)   . Psoriatic arthritis   . Dysmenorrhea   . Menorrhagia   . Smoker     quit 111/2013 after 20 years  . Hypertension    Past Surgical History  Procedure Laterality Date  . Esophagogastroduodenoscopy  09/11/99    tiny esophageal erosions with mild erosive reflux/no barrett's/normal stomach  . Colonoscopy  1998    normal, [place]  .  Cholecystectomy, laparoscopic  2006   Family History  Problem Relation Age of Onset  . Colon cancer Maternal Grandmother 22  . Brain cancer Father   . Prostate cancer Father   . Hyperlipidemia Mother   . Stroke Paternal Grandfather     COD   History  Substance Use Topics  . Smoking status: Former Smoker -- 0.50 packs/day for 20 years    Types: Cigarettes    Quit date: 03/12/2012  . Smokeless tobacco: Never Used  . Alcohol Use: 5.0 oz/week    10 drink(s) per week     Comment: socially, weekends 4-5   OB History   Grav Para Term Preterm Abortions TAB SAB Ect Mult Living   0              Review of Systems  Respiratory: Positive for shortness of breath.   Cardiovascular: Positive for chest pain.  All other systems reviewed and are negative.     Allergies  Aspirin  Home Medications   Prior to Admission medications   Medication Sig Start Date End Date Taking? Authorizing Provider  cholecalciferol (VITAMIN D) 1000 UNITS tablet Take 5,000 Units by mouth daily.     Historical Provider, MD  esomeprazole (NEXIUM) 40 MG capsule TAKE ONE CAPSULE BY MOUTH ONCE DAILY 06/14/13   Orvil Feil, NP  hydrochlorothiazide (HYDRODIURIL) 25 MG tablet Take 25 tablets by mouth. 12/14/12   Historical Provider, MD  lisinopril (PRINIVIL,ZESTRIL)  10 MG tablet Take 10 mg by mouth daily.    Historical Provider, MD  MAGNESIUM PO Take by mouth. Occasionally    Historical Provider, MD  TACLONEX external suspension as needed. 09/07/13   Historical Provider, MD  terbinafine (LAMISIL) 250 MG tablet Take 1 tablet (250 mg total) by mouth daily. 03/03/13   Max T Hyatt, DPM   BP 131/79  Pulse 101  Temp(Src) 97.7 F (36.5 C) (Oral)  Resp 18  SpO2 98% Physical Exam  Nursing note and vitals reviewed. Constitutional: She appears well-developed and well-nourished.  HENT:  Head: Normocephalic and atraumatic.  Mouth/Throat: Oropharynx is clear and moist.  Neck: Normal range of motion. Neck supple.   Cardiovascular: Normal rate, regular rhythm and normal heart sounds.   Pulses:      Dorsalis pedis pulses are 2+ on the right side, and 2+ on the left side.  Pulmonary/Chest: Effort normal and breath sounds normal. No respiratory distress. She has no wheezes. She has no rales. She exhibits no tenderness.  Abdominal: Soft. Bowel sounds are normal. There is no tenderness.  Musculoskeletal: Normal range of motion.  No LE edema bilaterally  Neurological: She is alert.  Skin: Skin is warm and dry.  Psychiatric: She has a normal mood and affect.    ED Course  Procedures (including critical care time) Labs Review Labs Reviewed  CBC - Abnormal; Notable for the following:    Hemoglobin 11.4 (*)    HCT 35.1 (*)    All other components within normal limits  COMPREHENSIVE METABOLIC PANEL - Abnormal; Notable for the following:    Sodium 136 (*)    Potassium 3.6 (*)    All other components within normal limits  URINALYSIS, ROUTINE W REFLEX MICROSCOPIC  I-STAT TROPOININ, ED  Randolm Idol, ED    Imaging Review Dg Chest 2 View  09/26/2013   CLINICAL DATA:  Chest pain with shortness breath.  EXAM: CHEST  2 VIEW  COMPARISON:  08/09/2013.  FINDINGS: Suboptimal inspiration is again noted. The heart size and mediastinal contours are normal. The lungs are clear. There is no pleural effusion or pneumothorax. No acute osseous findings are identified. There are stable degenerative changes in the spine.  IMPRESSION: Stable chest.  No acute cardiopulmonary process.   Electronically Signed   By: Camie Patience M.D.   On: 09/26/2013 12:58     EKG Interpretation None      4:57 PM Discussed with Trish with Cardiology.  She reports that Cardiology will come evaluate the patient in the ED. MDM   Final diagnoses:  None  Patient with a history of HTN and Psoriatic Arthritis presenting with chest pain and SOB.  She has recently been evaluated for CP by Dr. Debara Pickett with Cardiology in the office.  No  ischemic changes on EKG.  Initial troponin negative.  CXR negative.  Cardiology consulted and agreed to admit the patient.    Hyman Bible, PA-C 09/26/13 2158

## 2013-09-26 NOTE — H&P (Signed)
ADMISSION HISTORY & PHYSICAL   Chief Complaint:  Chest pain  Cardiologist: Jayvian Escoe  Primary Care Physician: Tula Nakayama  HPI:  Dawn Hayes is a pleasant 43 year old female who I recently saw in the office as a new patient for chest pain. Dawn Hayes described the onset of acute chest pain which awoke her from sleep in March of this year. She reported worsening pain with inspiration as well as lying flat and work hard her to sit up in a chair for 5 days until her symptoms improved. She felt some heaviness in her chest as well as pain between her shoulder blades in the back of her neck. The symptoms were unlike her reflux symptoms. She does have a history of arthritis which is psoriatic as well as recent psoriasis flares. She reports she was previously on methotrexate but no longer takes that. She is thinking about seeing a rheumatologist to get on Enbrel. She does has a history of hypertension as well as she was a former smoker with a 10 pack year smoking history but quit recently. This led to weight gain and some worsening fatigue. There is no significant family history of coronary disease. An EKG performed in the office today shows poor anterior R-wave regression which is concerning for possible remote anterolateral infarct. Based on her EKG abnormalities and chest pain as well as increased risk for coronary disease, I recommended a lexiscan nuclear stress test.   She has not had that study, but presents today to the ER with chest pain that has been gradually worsening over the past 2 days. This is also associated with dyspnea. Her symptoms are worse lying down and better sitting up. A d-dimer was ordered in the ER which was positive at 0.62.  2 troponins were negative.  PMHx:  Past Medical History  Diagnosis Date  . Diverticulitis   . GERD (gastroesophageal reflux disease)   . Psoriatic arthritis   . Dysmenorrhea   . Menorrhagia   . Smoker     quit 111/2013 after 20 years    . Hypertension     Past Surgical History  Procedure Laterality Date  . Esophagogastroduodenoscopy  09/11/99    tiny esophageal erosions with mild erosive reflux/no barrett's/normal stomach  . Colonoscopy  1998    normal, [place]  . Cholecystectomy, laparoscopic  2006    FAMHx:  Family History  Problem Relation Age of Onset  . Colon cancer Maternal Grandmother 72  . Brain cancer Father   . Prostate cancer Father   . Hyperlipidemia Mother   . Stroke Paternal Grandfather     COD    SOCHx:   reports that she quit smoking about 18 months ago. Her smoking use included Cigarettes. She has a 10 pack-year smoking history. She has never used smokeless tobacco. She reports that she drinks about 5 ounces of alcohol per week. She reports that she does not use illicit drugs.  ALLERGIES:  Allergies  Allergen Reactions  . Adhesive [Tape] Other (See Comments)    Hard to remove, due to psoriatic arthritis  . Aspirin Nausea Only    ROS: A comprehensive review of systems was negative except for: Respiratory: positive for dyspnea on exertion Cardiovascular: positive for chest pain  HOME MEDS:  (Not in a hospital admission)  LABS/IMAGING: Results for orders placed during the hospital encounter of 09/26/13 (from the past 48 hour(s))  CBC     Status: Abnormal   Collection Time    09/26/13 12:10 PM  Result Value Ref Range   WBC 9.0  4.0 - 10.5 K/uL   RBC 3.94  3.87 - 5.11 MIL/uL   Hemoglobin 11.4 (*) 12.0 - 15.0 g/dL   HCT 35.1 (*) 36.0 - 46.0 %   MCV 89.1  78.0 - 100.0 fL   MCH 28.9  26.0 - 34.0 pg   MCHC 32.5  30.0 - 36.0 g/dL   RDW 14.8  11.5 - 15.5 %   Platelets 286  150 - 400 K/uL  COMPREHENSIVE METABOLIC PANEL     Status: Abnormal   Collection Time    09/26/13 12:10 PM      Result Value Ref Range   Sodium 136 (*) 137 - 147 mEq/L   Potassium 3.6 (*) 3.7 - 5.3 mEq/L   Chloride 96  96 - 112 mEq/L   CO2 25  19 - 32 mEq/L   Glucose, Bld 99  70 - 99 mg/dL   BUN 11  6 -  23 mg/dL   Creatinine, Ser 0.59  0.50 - 1.10 mg/dL   Calcium 9.6  8.4 - 10.5 mg/dL   Total Protein 7.8  6.0 - 8.3 g/dL   Albumin 3.6  3.5 - 5.2 g/dL   AST 21  0 - 37 U/L   ALT 26  0 - 35 U/L   Alkaline Phosphatase 101  39 - 117 U/L   Total Bilirubin 0.4  0.3 - 1.2 mg/dL   GFR calc non Af Amer >90  >90 mL/min   GFR calc Af Amer >90  >90 mL/min   Comment: (NOTE)     The eGFR has been calculated using the CKD EPI equation.     This calculation has not been validated in all clinical situations.     eGFR's persistently <90 mL/min signify possible Chronic Kidney     Disease.  URINALYSIS, ROUTINE W REFLEX MICROSCOPIC     Status: None   Collection Time    09/26/13 12:13 PM      Result Value Ref Range   Color, Urine YELLOW  YELLOW   APPearance CLEAR  CLEAR   Specific Gravity, Urine 1.009  1.005 - 1.030   pH 6.0  5.0 - 8.0   Glucose, UA NEGATIVE  NEGATIVE mg/dL   Hgb urine dipstick NEGATIVE  NEGATIVE   Bilirubin Urine NEGATIVE  NEGATIVE   Ketones, ur NEGATIVE  NEGATIVE mg/dL   Protein, ur NEGATIVE  NEGATIVE mg/dL   Urobilinogen, UA 0.2  0.0 - 1.0 mg/dL   Nitrite NEGATIVE  NEGATIVE   Leukocytes, UA NEGATIVE  NEGATIVE   Comment: MICROSCOPIC NOT DONE ON URINES WITH NEGATIVE PROTEIN, BLOOD, LEUKOCYTES, NITRITE, OR GLUCOSE <1000 mg/dL.  Randolm Idol, ED     Status: None   Collection Time    09/26/13 12:26 PM      Result Value Ref Range   Troponin i, poc 0.00  0.00 - 0.08 ng/mL   Comment 3            Comment: Due to the release kinetics of cTnI,     a negative result within the first hours     of the onset of symptoms does not rule out     myocardial infarction with certainty.     If myocardial infarction is still suspected,     repeat the test at appropriate intervals.  Randolm Idol, ED     Status: None   Collection Time    09/26/13  5:20 PM      Result  Value Ref Range   Troponin i, poc 0.00  0.00 - 0.08 ng/mL   Comment 3            Comment: Due to the release kinetics of  cTnI,     a negative result within the first hours     of the onset of symptoms does not rule out     myocardial infarction with certainty.     If myocardial infarction is still suspected,     repeat the test at appropriate intervals.  D-DIMER, QUANTITATIVE     Status: Abnormal   Collection Time    09/26/13  6:17 PM      Result Value Ref Range   D-Dimer, Quant 0.62 (*) 0.00 - 0.48 ug/mL-FEU   Comment:            AT THE INHOUSE ESTABLISHED CUTOFF     VALUE OF 0.48 ug/mL FEU,     THIS ASSAY HAS BEEN DOCUMENTED     IN THE LITERATURE TO HAVE     A SENSITIVITY AND NEGATIVE     PREDICTIVE VALUE OF AT LEAST     98 TO 99%.  THE TEST RESULT     SHOULD BE CORRELATED WITH     AN ASSESSMENT OF THE CLINICAL     PROBABILITY OF DVT / VTE.   Dg Chest 2 View  09/26/2013   CLINICAL DATA:  Chest pain with shortness breath.  EXAM: CHEST  2 VIEW  COMPARISON:  08/09/2013.  FINDINGS: Suboptimal inspiration is again noted. The heart size and mediastinal contours are normal. The lungs are clear. There is no pleural effusion or pneumothorax. No acute osseous findings are identified. There are stable degenerative changes in the spine.  IMPRESSION: Stable chest.  No acute cardiopulmonary process.   Electronically Signed   By: Camie Patience M.D.   On: 09/26/2013 12:58    VITALS: Filed Vitals:   09/26/13 1853  BP: 112/75  Pulse: 88  Temp:   Resp: 16    EXAM: General appearance: alert and no distress Neck: no carotid bruit and no JVD Lungs: clear to auscultation bilaterally Heart: regular rate and rhythm, S1, S2 normal, no murmur, click, rub or gallop and heart sounds are muffled Abdomen: soft, non-tender; bowel sounds normal; no masses,  no organomegaly Extremities: extremities normal, atraumatic, no cyanosis or edema Pulses: 2+ and symmetric Skin: Skin color, texture, turgor normal. No rashes or lesions Neurologic: Grossly normal Psych: Mildly anxious  IMPRESSION: Principal Problem:   Chest  pain Active Problems:   Abnormal finding on EKG   Psoriasis   Psoriatic arthritis   HTN (hypertension)   Morbid obesity   PLAN: 1. She has ruled-out for ACS, however, obstructive CAD cannot be excluded. The positional nature of her symptoms are concerning for pericarditis/pericardial effusion - her heart size is normal on CXR, but this does not exclude it. In addition, d-dimer is positive, however, this could be expected in psoriatic arthritis. I would recommend a CT pulmonary angiogram to rule-out PE and evaluate for pericarditis/pericardial effusion as well. Would heparinize and start IV nitroglycerin. If cardiac enzymes are negative, will plan nuclear stress test in the am tomorrow.  She is agreeable to this plan.  Pixie Casino, MD, [facility] Attending Cardiologist [place] 09/26/2013, 7:24 PM

## 2013-09-26 NOTE — Consult Note (Signed)
  ADMISSION HISTORY & PHYSICAL   Chief Complaint:  Chest pain  Cardiologist: Savier Trickett  Primary Care Physician: KAPLAN,KRISTEN, PA-C  HPI:  Dawn Hayes is a pleasant 43-year-old female who I recently saw in the office as a new patient for chest pain. Dawn Hayes described the onset of acute chest pain which awoke her from sleep in March of this year. She reported worsening pain with inspiration as well as lying flat and work hard her to sit up in a chair for 5 days until her symptoms improved. She felt some heaviness in her chest as well as pain between her shoulder blades in the back of her neck. The symptoms were unlike her reflux symptoms. She does have a history of arthritis which is psoriatic as well as recent psoriasis flares. She reports she was previously on methotrexate but no longer takes that. She is thinking about seeing a rheumatologist to get on Enbrel. She does has a history of hypertension as well as she was a former smoker with a 10 pack year smoking history but quit recently. This led to weight gain and some worsening fatigue. There is no significant family history of coronary disease. An EKG performed in the office today shows poor anterior R-wave regression which is concerning for possible remote anterolateral infarct. Based on her EKG abnormalities and chest pain as well as increased risk for coronary disease, I recommended a lexiscan nuclear stress test.   She has not had that study, but presents today to the ER with chest pain that has been gradually worsening over the past 2 days. This is also associated with dyspnea. Her symptoms are worse lying down and better sitting up. A d-dimer was ordered in the ER which was positive at 0.62.  2 troponins were negative.  PMHx:  Past Medical History  Diagnosis Date  . Diverticulitis   . GERD (gastroesophageal reflux disease)   . Psoriatic arthritis   . Dysmenorrhea   . Menorrhagia   . Smoker     quit 111/2013 after 20 years    . Hypertension     Past Surgical History  Procedure Laterality Date  . Esophagogastroduodenoscopy  09/11/99    tiny esophageal erosions with mild erosive reflux/no barrett's/normal stomach  . Colonoscopy  1998    normal, [place]  . Cholecystectomy, laparoscopic  2006    FAMHx:  Family History  Problem Relation Age of Onset  . Colon cancer Maternal Grandmother 71  . Brain cancer Father   . Prostate cancer Father   . Hyperlipidemia Mother   . Stroke Paternal Grandfather     COD    SOCHx:   reports that she quit smoking about 18 months ago. Her smoking use included Cigarettes. She has a 10 pack-year smoking history. She has never used smokeless tobacco. She reports that she drinks about 5 ounces of alcohol per week. She reports that she does not use illicit drugs.  ALLERGIES:  Allergies  Allergen Reactions  . Adhesive [Tape] Other (See Comments)    Hard to remove, due to psoriatic arthritis  . Aspirin Nausea Only    ROS: A comprehensive review of systems was negative except for: Respiratory: positive for dyspnea on exertion Cardiovascular: positive for chest pain  HOME MEDS:  (Not in a hospital admission)  LABS/IMAGING: Results for orders placed during the hospital encounter of 09/26/13 (from the past 48 hour(s))  CBC     Status: Abnormal   Collection Time    09/26/13 12:10 PM        Result Value Ref Range   WBC 9.0  4.0 - 10.5 K/uL   RBC 3.94  3.87 - 5.11 MIL/uL   Hemoglobin 11.4 (*) 12.0 - 15.0 g/dL   HCT 35.1 (*) 36.0 - 46.0 %   MCV 89.1  78.0 - 100.0 fL   MCH 28.9  26.0 - 34.0 pg   MCHC 32.5  30.0 - 36.0 g/dL   RDW 14.8  11.5 - 15.5 %   Platelets 286  150 - 400 K/uL  COMPREHENSIVE METABOLIC PANEL     Status: Abnormal   Collection Time    09/26/13 12:10 PM      Result Value Ref Range   Sodium 136 (*) 137 - 147 mEq/L   Potassium 3.6 (*) 3.7 - 5.3 mEq/L   Chloride 96  96 - 112 mEq/L   CO2 25  19 - 32 mEq/L   Glucose, Bld 99  70 - 99 mg/dL   BUN 11  6 -  23 mg/dL   Creatinine, Ser 0.59  0.50 - 1.10 mg/dL   Calcium 9.6  8.4 - 10.5 mg/dL   Total Protein 7.8  6.0 - 8.3 g/dL   Albumin 3.6  3.5 - 5.2 g/dL   AST 21  0 - 37 U/L   ALT 26  0 - 35 U/L   Alkaline Phosphatase 101  39 - 117 U/L   Total Bilirubin 0.4  0.3 - 1.2 mg/dL   GFR calc non Af Amer >90  >90 mL/min   GFR calc Af Amer >90  >90 mL/min   Comment: (NOTE)     The eGFR has been calculated using the CKD EPI equation.     This calculation has not been validated in all clinical situations.     eGFR's persistently <90 mL/min signify possible Chronic Kidney     Disease.  URINALYSIS, ROUTINE W REFLEX MICROSCOPIC     Status: None   Collection Time    09/26/13 12:13 PM      Result Value Ref Range   Color, Urine YELLOW  YELLOW   APPearance CLEAR  CLEAR   Specific Gravity, Urine 1.009  1.005 - 1.030   pH 6.0  5.0 - 8.0   Glucose, UA NEGATIVE  NEGATIVE mg/dL   Hgb urine dipstick NEGATIVE  NEGATIVE   Bilirubin Urine NEGATIVE  NEGATIVE   Ketones, ur NEGATIVE  NEGATIVE mg/dL   Protein, ur NEGATIVE  NEGATIVE mg/dL   Urobilinogen, UA 0.2  0.0 - 1.0 mg/dL   Nitrite NEGATIVE  NEGATIVE   Leukocytes, UA NEGATIVE  NEGATIVE   Comment: MICROSCOPIC NOT DONE ON URINES WITH NEGATIVE PROTEIN, BLOOD, LEUKOCYTES, NITRITE, OR GLUCOSE <1000 mg/dL.  I-STAT TROPOININ, ED     Status: None   Collection Time    09/26/13 12:26 PM      Result Value Ref Range   Troponin i, poc 0.00  0.00 - 0.08 ng/mL   Comment 3            Comment: Due to the release kinetics of cTnI,     a negative result within the first hours     of the onset of symptoms does not rule out     myocardial infarction with certainty.     If myocardial infarction is still suspected,     repeat the test at appropriate intervals.  I-STAT TROPOININ, ED     Status: None   Collection Time    09/26/13  5:20 PM      Result   Value Ref Range   Troponin i, poc 0.00  0.00 - 0.08 ng/mL   Comment 3            Comment: Due to the release kinetics of  cTnI,     a negative result within the first hours     of the onset of symptoms does not rule out     myocardial infarction with certainty.     If myocardial infarction is still suspected,     repeat the test at appropriate intervals.  D-DIMER, QUANTITATIVE     Status: Abnormal   Collection Time    09/26/13  6:17 PM      Result Value Ref Range   D-Dimer, Quant 0.62 (*) 0.00 - 0.48 ug/mL-FEU   Comment:            AT THE INHOUSE ESTABLISHED CUTOFF     VALUE OF 0.48 ug/mL FEU,     THIS ASSAY HAS BEEN DOCUMENTED     IN THE LITERATURE TO HAVE     A SENSITIVITY AND NEGATIVE     PREDICTIVE VALUE OF AT LEAST     98 TO 99%.  THE TEST RESULT     SHOULD BE CORRELATED WITH     AN ASSESSMENT OF THE CLINICAL     PROBABILITY OF DVT / VTE.   Dg Chest 2 View  09/26/2013   CLINICAL DATA:  Chest pain with shortness breath.  EXAM: CHEST  2 VIEW  COMPARISON:  08/09/2013.  FINDINGS: Suboptimal inspiration is again noted. The heart size and mediastinal contours are normal. The lungs are clear. There is no pleural effusion or pneumothorax. No acute osseous findings are identified. There are stable degenerative changes in the spine.  IMPRESSION: Stable chest.  No acute cardiopulmonary process.   Electronically Signed   By: Bill  Veazey M.D.   On: 09/26/2013 12:58    VITALS: Filed Vitals:   09/26/13 1853  BP: 112/75  Pulse: 88  Temp:   Resp: 16    EXAM: General appearance: alert and no distress Neck: no carotid bruit and no JVD Lungs: clear to auscultation bilaterally Heart: regular rate and rhythm, S1, S2 normal, no murmur, click, rub or gallop and heart sounds are muffled Abdomen: soft, non-tender; bowel sounds normal; no masses,  no organomegaly Extremities: extremities normal, atraumatic, no cyanosis or edema Pulses: 2+ and symmetric Skin: Skin color, texture, turgor normal. No rashes or lesions Neurologic: Grossly normal Psych: Mildly anxious  IMPRESSION: Principal Problem:   Chest  pain Active Problems:   Abnormal finding on EKG   Psoriasis   Psoriatic arthritis   HTN (hypertension)   Morbid obesity   PLAN: 1. She has ruled-out for ACS, however, obstructive CAD cannot be excluded. The positional nature of her symptoms are concerning for pericarditis/pericardial effusion - her heart size is normal on CXR, but this does not exclude it. In addition, d-dimer is positive, however, this could be expected in psoriatic arthritis. I would recommend a CT pulmonary angiogram to rule-out PE and evaluate for pericarditis/pericardial effusion as well. Would heparinize and start IV nitroglycerin. If cardiac enzymes are negative, will plan nuclear stress test in the am tomorrow.  She is agreeable to this plan.  Tanis Hensarling C. Jenah Vanasten, MD, FACC Attending Cardiologist [facility]  Keyon Winnick C. Iyana Topor 09/26/2013, 7:24 PM  

## 2013-09-26 NOTE — ED Notes (Addendum)
Cp and sob since sat is to have stress test t Thursday worse when she lays down states had to sleep sitting up in chair no n/vd center of chest pain sore to touch had this ame pain 2 months ago which is why she was referred to cards dr

## 2013-09-26 NOTE — ED Provider Notes (Signed)
Medical screening examination/treatment/procedure(s) were conducted as a shared visit with non-physician practitioner(s) and myself.  I personally evaluated the patient during the encounter.   EKG Interpretation None      Patient here with CP. Hx of psoriatic arthritis, which is a risk factor for CAD. Patient admitted to [facility].  Osvaldo Shipper, MD 09/26/13 [phone]

## 2013-09-26 NOTE — Progress Notes (Signed)
ANTICOAGULATION CONSULT NOTE - Initial Consult  Pharmacy Consult for heparin Indication: ACS/STEMI  Allergies  Allergen Reactions  . Adhesive [Tape] Other (See Comments)    Hard to remove, due to psoriatic arthritis  . Aspirin Nausea Only    Patient Measurements: Height: 5\' 1"  (154.9 cm) Weight: 248 lb 14.4 oz (112.9 kg) IBW/kg (Calculated) : 47.8 Heparin Dosing Weight: 74 kg  Vital Signs: Temp: 99.1 F (37.3 C) (05/18 2112) Temp src: Oral (05/18 2112) BP: 140/71 mmHg (05/18 2112) Pulse Rate: 89 (05/18 2112)  Labs:  Recent Labs  09/26/13 1210  HGB 11.4*  HCT 35.1*  PLT 286  CREATININE 0.59    Estimated Creatinine Clearance: 106.7 ml/min (by C-G formula based on Cr of 0.59).   Medical History: Past Medical History  Diagnosis Date  . Diverticulitis   . GERD (gastroesophageal reflux disease)   . Psoriatic arthritis   . Dysmenorrhea   . Menorrhagia   . Smoker     quit 111/2013 after 20 years  . Hypertension     Medications:  Prescriptions prior to admission  Medication Sig Dispense Refill  . Cholecalciferol (VITAMIN D3) 5000 UNITS TABS Take 5,000 Units by mouth daily.      Marland Kitchen esomeprazole (NEXIUM) 40 MG capsule Take 40 mg by mouth daily at 12 noon.      . hydrochlorothiazide (HYDRODIURIL) 25 MG tablet Take 25 tablets by mouth daily.       Marland Kitchen ibuprofen (ADVIL,MOTRIN) 200 MG tablet Take 600 mg by mouth every 6 (six) hours as needed for moderate pain.      Marland Kitchen lisinopril (PRINIVIL,ZESTRIL) 10 MG tablet Take 10 mg by mouth daily.      Marland Kitchen MAGNESIUM PO Take 1 tablet by mouth daily as needed (for cramping). Occasionally      . TACLONEX external suspension Apply 1 application topically as needed (for psoriasis).         Assessment: 43 yo F admitted with chest pain. Pharmacy consulted to dose heparin for ACS/STEMI.   She has ruled out for ACS, but obstructive CAD cannot be excluded.  For nuclear stress test in am. Wt 113 kg. H/H 11.4/35.1 pltc 285, creat 0.59. D dimer  0.62 5/18 CT angio negative for PE; troponin neg x 2.   Goal of Therapy:  Heparin level 0.3-0.7 units/ml Monitor platelets by anticoagulation protocol: Yes   Plan:  Give 4000 units bolus x 1 Start heparin infusion at 1200 units/hr Check anti-Xa level in 6 hours and daily while on heparin Continue to monitor H&H and platelets Eudelia Bunch, Pharm.D. [phone] 09/26/2013 10:03 PM

## 2013-09-27 ENCOUNTER — Encounter (HOSPITAL_COMMUNITY): Payer: 59

## 2013-09-27 ENCOUNTER — Inpatient Hospital Stay (HOSPITAL_COMMUNITY): Payer: 59

## 2013-09-27 DIAGNOSIS — R079 Chest pain, unspecified: Secondary | ICD-10-CM | POA: Diagnosis present

## 2013-09-27 LAB — BASIC METABOLIC PANEL
BUN: 11 mg/dL (ref 6–23)
CALCIUM: 9.3 mg/dL (ref 8.4–10.5)
CO2: 24 mEq/L (ref 19–32)
Chloride: 98 mEq/L (ref 96–112)
Creatinine, Ser: 0.64 mg/dL (ref 0.50–1.10)
GFR calc Af Amer: >90 mL/min (ref >90)
Glucose, Bld: 99 mg/dL (ref 70–99)
Potassium: 3.8 mEq/L (ref 3.7–5.3)
SODIUM: 139 meq/L (ref 137–147)

## 2013-09-27 LAB — LIPID PANEL
Cholesterol: 224 mg/dL — ABNORMAL HIGH (ref 0–200)
HDL: 58 mg/dL (ref >39)
LDL CALC: 109 mg/dL — AB (ref 0–99)
Total CHOL/HDL Ratio: 3.9 RATIO
Triglycerides: 287 mg/dL — ABNORMAL HIGH (ref <150)
VLDL: 57 mg/dL — ABNORMAL HIGH (ref 0–40)

## 2013-09-27 LAB — CBC
HCT: 32.6 % — ABNORMAL LOW (ref 36.0–46.0)
Hemoglobin: 10.4 g/dL — ABNORMAL LOW (ref 12.0–15.0)
MCH: 28.6 pg (ref 26.0–34.0)
MCHC: 31.9 g/dL (ref 30.0–36.0)
MCV: 89.6 fL (ref 78.0–100.0)
PLATELETS: 288 10*3/uL (ref 150–400)
RBC: 3.64 MIL/uL — AB (ref 3.87–5.11)
RDW: 14.9 % (ref 11.5–15.5)
WBC: 6.7 10*3/uL (ref 4.0–10.5)

## 2013-09-27 LAB — TSH: TSH: 3.1 u[IU]/mL (ref 0.350–4.500)

## 2013-09-27 LAB — TROPONIN I: Troponin I: <0.3 ng/mL (ref <0.30)

## 2013-09-27 LAB — PRO B NATRIURETIC PEPTIDE: PRO B NATRI PEPTIDE: 41.5 pg/mL (ref 0–125)

## 2013-09-27 LAB — HEPARIN LEVEL (UNFRACTIONATED): Heparin Unfractionated: <0.1 IU/mL — ABNORMAL LOW (ref 0.30–0.70)

## 2013-09-27 MED ORDER — DOCUSATE SODIUM 100 MG PO CAPS
100.0000 mg | ORAL_CAPSULE | Freq: Two times a day (BID) | ORAL | Status: DC
  Filled 2013-09-27 (×2): qty 1

## 2013-09-27 MED ORDER — REGADENOSON 0.4 MG/5ML IV SOLN
INTRAVENOUS | Status: AC
  Administered 2013-09-27: 0.4 mg via INTRAVENOUS
  Filled 2013-09-27: qty 5

## 2013-09-27 MED ORDER — ACETAMINOPHEN 325 MG PO TABS
650.0000 mg | ORAL_TABLET | Freq: Four times a day (QID) | ORAL | Status: DC | PRN
  Administered 2013-09-27: 650 mg via ORAL
  Filled 2013-09-27: qty 2

## 2013-09-27 MED ORDER — REGADENOSON 0.4 MG/5ML IV SOLN
0.4000 mg | Freq: Once | INTRAVENOUS | Status: AC
  Administered 2013-09-27: 0.4 mg via INTRAVENOUS

## 2013-09-27 MED ORDER — TECHNETIUM TC 99M SESTAMIBI GENERIC - CARDIOLITE
30.0000 | Freq: Once | INTRAVENOUS | Status: AC | PRN
  Administered 2013-09-27: 30 via INTRAVENOUS

## 2013-09-27 MED ORDER — ATORVASTATIN CALCIUM 20 MG PO TABS
20.0000 mg | ORAL_TABLET | Freq: Every day | ORAL | Status: DC
  Filled 2013-09-27: qty 1

## 2013-09-27 MED ORDER — HEPARIN BOLUS VIA INFUSION
2000.0000 [IU] | Freq: Once | INTRAVENOUS | Status: AC
  Administered 2013-09-27: 2000 [IU] via INTRAVENOUS
  Filled 2013-09-27: qty 2000

## 2013-09-27 MED ORDER — ACETAMINOPHEN 325 MG PO TABS: 650.0000 mg | ORAL_TABLET | Freq: Four times a day (QID) | ORAL | Status: DC | PRN

## 2013-09-27 MED ORDER — TECHNETIUM TC 99M SESTAMIBI GENERIC - CARDIOLITE
10.0000 | Freq: Once | INTRAVENOUS | Status: AC | PRN
  Administered 2013-09-27: 10 via INTRAVENOUS

## 2013-09-27 NOTE — Discharge Instructions (Signed)
Chest Pain (Nonspecific) °It is often hard to give a specific diagnosis for the cause of chest pain. There is always a chance that your pain could be related to something serious, such as a heart attack or a blood clot in the lungs. You need to follow up with your caregiver for further evaluation. °CAUSES  °· Heartburn. °· Pneumonia or bronchitis. °· Anxiety or stress. °· Inflammation around your heart (pericarditis) or lung (pleuritis or pleurisy). °· A blood clot in the lung. °· A collapsed lung (pneumothorax). It can develop suddenly on its own (spontaneous pneumothorax) or from injury (trauma) to the chest. °· Shingles infection (herpes zoster virus). °The chest wall is composed of bones, muscles, and cartilage. Any of these can be the source of the pain. °· The bones can be bruised by injury. °· The muscles or cartilage can be strained by coughing or overwork. °· The cartilage can be affected by inflammation and become sore (costochondritis). °DIAGNOSIS  °Lab tests or other studies, such as X-rays, electrocardiography, stress testing, or cardiac imaging, may be needed to find the cause of your pain.  °TREATMENT  °· Treatment depends on what may be causing your chest pain. Treatment may include: °· Acid blockers for heartburn. °· Anti-inflammatory medicine. °· Pain medicine for inflammatory conditions. °· Antibiotics if an infection is present. °· You may be advised to change lifestyle habits. This includes stopping smoking and avoiding alcohol, caffeine, and chocolate. °· You may be advised to keep your head raised (elevated) when sleeping. This reduces the chance of acid going backward from your stomach into your esophagus. °· Most of the time, nonspecific chest pain will improve within 2 to 3 days with rest and mild pain medicine. °HOME CARE INSTRUCTIONS  °· If antibiotics were prescribed, take your antibiotics as directed. Finish them even if you start to feel better. °· For the next few days, avoid physical  activities that bring on chest pain. Continue physical activities as directed. °· Do not smoke. °· Avoid drinking alcohol. °· Only take over-the-counter or prescription medicine for pain, discomfort, or fever as directed by your caregiver. °· Follow your caregiver's suggestions for further testing if your chest pain does not go away. °· Keep any follow-up appointments you made. If you do not go to an appointment, you could develop lasting (chronic) problems with pain. If there is any problem keeping an appointment, you must call to reschedule. °SEEK MEDICAL CARE IF:  °· You think you are having problems from the medicine you are taking. Read your medicine instructions carefully. °· Your chest pain does not go away, even after treatment. °· You develop a rash with blisters on your chest. °SEEK IMMEDIATE MEDICAL CARE IF:  °· You have increased chest pain or pain that spreads to your arm, neck, jaw, back, or abdomen. °· You develop shortness of breath, an increasing cough, or you are coughing up blood. °· You have severe back or abdominal pain, feel nauseous, or vomit. °· You develop severe weakness, fainting, or chills. °· You have a fever. °THIS IS AN EMERGENCY. Do not wait to see if the pain will go away. Get medical help at once. Call your local emergency services (911 in [place] Do not drive yourself to the hospital. °MAKE SURE YOU:  °· Understand these instructions. °· Will watch your condition. °· Will get help right away if you are not doing well or get worse. °Document Released: 02/05/2005 Document Revised: 07/21/2011 Document Reviewed: 12/02/2007 °[employer]® Patient Information ©2014 [employer]   [employer]. ° °

## 2013-09-27 NOTE — Discharge Summary (Signed)
Dawn Hayes 09/27/2013

## 2013-09-27 NOTE — Progress Notes (Signed)
UR completed 

## 2013-09-27 NOTE — Progress Notes (Signed)
Pt updated that she will be transported via wheelchair to [facility]uc [facility] for stress test - pt verbalizes understanding and has no questions or concerns at this time.  Confirmed with charge RN that pt does not require RN transport to [facility] for ordered stress test - ticket to ride printed and hall pass completed.  No acute issues prior to transport.

## 2013-09-27 NOTE — Progress Notes (Signed)
Patient Name: Dawn Hayes Date of Encounter: 09/27/2013  Principal Problem:   Chest pain Active Problems:   Abnormal finding on EKG   Psoriasis   Psoriatic arthritis   HTN (hypertension)   Morbid obesity   Length of Stay: 1  SUBJECTIVE  Chest pain free this am.  CURRENT MEDS . aspirin  324 mg Oral NOW   Or  . aspirin  300 mg Rectal NOW  . aspirin EC  81 mg Oral Daily  . hydrochlorothiazide  25 mg Oral Daily  . lisinopril  10 mg Oral Daily  . pantoprazole  40 mg Oral Daily  . sodium chloride  3 mL Intravenous Q12H   . heparin 1,500 Units/hr (09/27/13 5366)  . nitroGLYCERIN 10 mcg/min (09/27/13 0900)    OBJECTIVE  Filed Vitals:   09/27/13 0400 09/27/13 0721 09/27/13 0827 09/27/13 0951  BP: 120/56 116/74 119/70 122/78  Pulse:  83    Temp:  98 F (36.7 C)    TempSrc:  Oral    Resp: 18     Height:      Weight:      SpO2: 98% 95%  96%    Intake/Output Summary (Last 24 hours) at 09/27/13 1059 Last data filed at 09/27/13 0900  Gross per 24 hour  Intake  167.6 ml  Output      0 ml  Net  167.6 ml   Filed Weights   09/26/13 2112 09/27/13 0352  Weight: 248 lb 14.4 oz (112.9 kg) 248 lb 14.4 oz (112.9 kg)    PHYSICAL EXAM  General: Pleasant, NAD. Neuro: Alert and oriented X 3. Moves all extremities spontaneously. Psych: Normal affect. HEENT:  Normal  Neck: Supple without bruits or JVD. Lungs:  Resp regular and unlabored, CTA. Heart: RRR no s3, s4, or murmurs. Abdomen: Soft, non-tender, non-distended, BS + x 4.  Extremities: No clubbing, cyanosis or edema. DP/PT/Radials 2+ and equal bilaterally.  Accessory Clinical Findings  CBC  Recent Labs  09/26/13 1210 09/27/13 0400  WBC 9.0 6.7  HGB 11.4* 10.4*  HCT 35.1* 32.6*  MCV 89.1 89.6  PLT 286 440   Basic Metabolic Panel  Recent Labs  09/26/13 1210 09/27/13 0400  NA 136* 139  K 3.6* 3.8  CL 96 98  CO2 25 24  GLUCOSE 99 99  BUN 11 11  CREATININE 0.59 0.64  CALCIUM 9.6 9.3    Liver Function Tests  Recent Labs  09/26/13 1210  AST 21  ALT 26  ALKPHOS 101  BILITOT 0.4  PROT 7.8  ALBUMIN 3.6   Cardiac Enzymes  Recent Labs  09/26/13 2320 09/27/13 0400  TROPONINI <0.30 <0.30    Recent Labs  09/26/13 1817  DDIMER 0.62*   Recent Labs  09/27/13 0400  CHOL 224*  HDL 58  LDLCALC 109*  TRIG 287*  CHOLHDL 3.9   Thyroid Function Tests  Recent Labs  09/27/13 0400  TSH 3.100   Radiology/Studies  Dg Chest 2 View  09/26/2013   CLINICAL DATA:  Chest pain with shortness breath.   IMPRESSION: Stable chest.  No acute cardiopulmonary process.      Ct Angio Chest Pe W/cm &/or Wo Cm  09/26/2013   CLINICAL DATA:  Pericardial effusion. Evaluate for pulmonary embolism.    IMPRESSION: 1. Despite the mild limitations of today's examination there is no evidence to suggest clinically relevant central, lobar or segmental sized pulmonary embolism. 2. No pericardial effusion. 3. No acute findings.     TELE:  SR    ASSESSMENT AND PLAN  Principal Problem:  Chest pain  Active Problems:  Abnormal finding on EKG  Psoriasis  Psoriatic arthritis  HTN (hypertension)  Morbid obesity  43 year old female with h/o HTN, former smoker, psoriatic arthritis, recently seen in the office for episodes of acute chest pain worsening with inspiration as well as lying flat, as well as pain between her shoulder blades in the back of her neck. The symptoms were unlike her reflux symptoms. There is no significant family history of coronary disease. An EKG performed in the office today shows poor anterior R-wave regression which is concerning for possible remote anterolateral infarct. She was scheduled for a lexiscan nuclear stress test.  She has not had that study, but presents today to the ER with chest pain that has been gradually worsening over the past 2 days. This is also associated with dyspnea. Her symptoms are worse lying down and better sitting up. A d-dimer was ordered  in the ER which was positive at 0.62. 2 troponins were negative. Negative chest CT for pulmonary embolism.  She has ruled-out for ACS, chest CT showed no pulmonary embolism and no pericardial effusion. Nuclear stress test is pending. I would stop heparin.  If stress test negative, we will discharge home with an outpatient follow up. I would continue aspirin 81 mg po daily and start atorvastatin 20 mg po daily.  Signed, Dorothy Spark MD, [facility] 09/27/2013

## 2013-09-27 NOTE — Progress Notes (Signed)
[facility] for heparin Indication: ACS/STEMI  Allergies  Allergen Reactions  . Adhesive [Tape] Other (See Comments)    Hard to remove, due to psoriatic arthritis  . Aspirin Nausea Only    Patient Measurements: Height: 5\' 1"  (154.9 cm) Weight: 248 lb 14.4 oz (112.9 kg) IBW/kg (Calculated) : 47.8 Heparin Dosing Weight: 74 kg  Vital Signs: Temp: 99.1 F (37.3 C) (05/19 0352) Temp src: Oral (05/19 0352) BP: 120/56 mmHg (05/19 0400) Pulse Rate: 89 (05/18 2112)  Labs:  Recent Labs  09/26/13 1210 09/26/13 2320 09/27/13 0400  HGB 11.4*  --  10.4*  HCT 35.1*  --  32.6*  PLT 286  --  288  HEPARINUNFRC  --   --  <0.10*  CREATININE 0.59  --   --   TROPONINI  --  <0.30  --     Estimated Creatinine Clearance: 106.7 ml/min (by C-G formula based on Cr of 0.59).  Assessment: 43 y.o. female with chest pain for heparin  Goal of Therapy:  Heparin level 0.3-0.7 units/ml Monitor platelets by anticoagulation protocol: Yes   Plan:  Heparin 2000 units IV bolus, then increase heparin 1500 units/hr Check heparin level in 6 hours.  Phillis Knack, PharmD, BCPS  09/27/2013 5:14 AM

## 2013-09-27 NOTE — Progress Notes (Signed)
Reviewed pt's discharge information with pt - provided education on chest pain and the importance of attending follow up appointments - pt verbalizes understanding and has no questions or concerns at this time.  No c/o pain or discomfort.  Pt escorted via wheelchair to the hospital entrance - no acute issues upon discharge.

## 2013-09-27 NOTE — Discharge Summary (Signed)
Patient ID: Dawn Hayes,  MRN: [id number], DOB/AGE: November 05, 1970 43 y.o.  Admit date: 09/26/2013 Discharge date: 09/27/2013  Primary Care Provider: Tula Nakayama Primary Cardiologist: Dr Debara Pickett  Discharge Diagnoses Principal Problem:   Chest pain, rule out acute myocardial infarction Active Problems:   Abnormal finding on EKG   HTN (hypertension)   Psoriasis   Psoriatic arthritis   Morbid obesity- BMI 47    Procedures: Lexiscan Myoview 09/27/13   Hospital Course: 43 y/o morbidly obese female with a history of HTN and psoriatic arthritis and psoriasis who saw Dr Debara Pickett in the office 09/16/13 for chest pain. She had an abnormal EKG with poor anterior RW progression concerning for possible remote AL MI. He had planned an OP Lexiscan but the pt came to the ER with chest pain 09/26/13 and was admitted. She had an elevated D-dimer- CTA was negative for pulmonary embolism or pericardial effusion. She ruled out for an MI by Troponin. A Lexiscan Myoview was done 09/27/13 and was negative for ischemia. Her EF was 74%. It was felt she could be discharge later on the 19th.   Discharge Vitals:  Blood pressure 89/71, pulse 75, temperature 97 F (36.1 C), temperature source Oral, resp. rate 75, height _0  (1.549 m), weight 248 lb 14.4 oz (112.9 kg), last menstrual period 09/03/2013, SpO2 98.00%.    Labs: Results for orders placed during the hospital encounter of 09/26/13 (from the past 48 hour(s))  CBC     Status: Abnormal   Collection Time    09/26/13 12:10 PM      Result Value Ref Range   WBC 9.0  4.0 - 10.5 K/uL   RBC 3.94  3.87 - 5.11 MIL/uL   Hemoglobin 11.4 (*) 12.0 - 15.0 g/dL   HCT 35.1 (*) 36.0 - 46.0 %   MCV 89.1  78.0 - 100.0 fL   MCH 28.9  26.0 - 34.0 pg   MCHC 32.5  30.0 - 36.0 g/dL   RDW 14.8  11.5 - 15.5 %   Platelets 286  150 - 400 K/uL  COMPREHENSIVE METABOLIC PANEL     Status: Abnormal   Collection Time    09/26/13 12:10 PM      Result Value Ref Range   Sodium 136 (*) 137 - 147 mEq/L   Potassium 3.6 (*) 3.7 - 5.3 mEq/L   Chloride 96  96 - 112 mEq/L   CO2 25  19 - 32 mEq/L   Glucose, Bld 99  70 - 99 mg/dL   BUN 11  6 - 23 mg/dL   Creatinine, Ser 0.59  0.50 - 1.10 mg/dL   Calcium 9.6  8.4 - 10.5 mg/dL   Total Protein 7.8  6.0 - 8.3 g/dL   Albumin 3.6  3.5 - 5.2 g/dL   AST 21  0 - 37 U/L   ALT 26  0 - 35 U/L   Alkaline Phosphatase 101  39 - 117 U/L   Total Bilirubin 0.4  0.3 - 1.2 mg/dL   GFR calc non Af Amer >90  >90 mL/min   GFR calc Af Amer >90  >90 mL/min   Comment: (NOTE)     The eGFR has been calculated using the CKD EPI equation.     This calculation has not been validated in all clinical situations.     eGFR's persistently <90 mL/min signify possible Chronic Kidney     Disease.  URINALYSIS, ROUTINE W REFLEX MICROSCOPIC     Status: None  Collection Time    09/26/13 12:13 PM      Result Value Ref Range   Color, Urine YELLOW  YELLOW   APPearance CLEAR  CLEAR   Specific Gravity, Urine 1.009  1.005 - 1.030   pH 6.0  5.0 - 8.0   Glucose, UA NEGATIVE  NEGATIVE mg/dL   Hgb urine dipstick NEGATIVE  NEGATIVE   Bilirubin Urine NEGATIVE  NEGATIVE   Ketones, ur NEGATIVE  NEGATIVE mg/dL   Protein, ur NEGATIVE  NEGATIVE mg/dL   Urobilinogen, UA 0.2  0.0 - 1.0 mg/dL   Nitrite NEGATIVE  NEGATIVE   Leukocytes, UA NEGATIVE  NEGATIVE   Comment: MICROSCOPIC NOT DONE ON URINES WITH NEGATIVE PROTEIN, BLOOD, LEUKOCYTES, NITRITE, OR GLUCOSE <1000 mg/dL.  Randolm Idol, ED     Status: None   Collection Time    09/26/13 12:26 PM      Result Value Ref Range   Troponin i, poc 0.00  0.00 - 0.08 ng/mL   Comment 3            Comment: Due to the release kinetics of cTnI,     a negative result within the first hours     of the onset of symptoms does not rule out     myocardial infarction with certainty.     If myocardial infarction is still suspected,     repeat the test at appropriate intervals.  Randolm Idol, ED     Status: None    Collection Time    09/26/13  5:20 PM      Result Value Ref Range   Troponin i, poc 0.00  0.00 - 0.08 ng/mL   Comment 3            Comment: Due to the release kinetics of cTnI,     a negative result within the first hours     of the onset of symptoms does not rule out     myocardial infarction with certainty.     If myocardial infarction is still suspected,     repeat the test at appropriate intervals.  D-DIMER, QUANTITATIVE     Status: Abnormal   Collection Time    09/26/13  6:17 PM      Result Value Ref Range   D-Dimer, Quant 0.62 (*) 0.00 - 0.48 ug/mL-FEU   Comment:            AT THE INHOUSE ESTABLISHED CUTOFF     VALUE OF 0.48 ug/mL FEU,     THIS ASSAY HAS BEEN DOCUMENTED     IN THE LITERATURE TO HAVE     A SENSITIVITY AND NEGATIVE     PREDICTIVE VALUE OF AT LEAST     98 TO 99%.  THE TEST RESULT     SHOULD BE CORRELATED WITH     AN ASSESSMENT OF THE CLINICAL     PROBABILITY OF DVT / VTE.  MRSA PCR SCREENING     Status: None   Collection Time    09/26/13  9:16 PM      Result Value Ref Range   MRSA by PCR NEGATIVE  NEGATIVE   Comment:            The GeneXpert MRSA Assay (FDA     approved for NASAL specimens     only), is one component of a     comprehensive MRSA colonization     surveillance program. It is not     intended to diagnose MRSA  infection nor to guide or     monitor treatment for     MRSA infections.  TROPONIN I     Status: None   Collection Time    09/26/13 11:20 PM      Result Value Ref Range   Troponin I <0.30  <0.30 ng/mL   Comment:            Due to the release kinetics of cTnI,     a negative result within the first hours     of the onset of symptoms does not rule out     myocardial infarction with certainty.     If myocardial infarction is still suspected,     repeat the test at appropriate intervals.  TROPONIN I     Status: None   Collection Time    09/27/13  4:00 AM      Result Value Ref Range   Troponin I <0.30  <0.30 ng/mL    Comment:            Due to the release kinetics of cTnI,     a negative result within the first hours     of the onset of symptoms does not rule out     myocardial infarction with certainty.     If myocardial infarction is still suspected,     repeat the test at appropriate intervals.  CBC     Status: Abnormal   Collection Time    09/27/13  4:00 AM      Result Value Ref Range   WBC 6.7  4.0 - 10.5 K/uL   RBC 3.64 (*) 3.87 - 5.11 MIL/uL   Hemoglobin 10.4 (*) 12.0 - 15.0 g/dL   HCT 32.6 (*) 36.0 - 46.0 %   MCV 89.6  78.0 - 100.0 fL   MCH 28.6  26.0 - 34.0 pg   MCHC 31.9  30.0 - 36.0 g/dL   RDW 14.9  11.5 - 15.5 %   Platelets 288  150 - 400 K/uL  BASIC METABOLIC PANEL     Status: None   Collection Time    09/27/13  4:00 AM      Result Value Ref Range   Sodium 139  137 - 147 mEq/L   Potassium 3.8  3.7 - 5.3 mEq/L   Comment: HEMOLYSIS AT THIS LEVEL MAY AFFECT RESULT   Chloride 98  96 - 112 mEq/L   CO2 24  19 - 32 mEq/L   Glucose, Bld 99  70 - 99 mg/dL   BUN 11  6 - 23 mg/dL   Creatinine, Ser 0.64  0.50 - 1.10 mg/dL   Calcium 9.3  8.4 - 10.5 mg/dL   GFR calc non Af Amer >90  >90 mL/min   GFR calc Af Amer >90  >90 mL/min   Comment: (NOTE)     The eGFR has been calculated using the CKD EPI equation.     This calculation has not been validated in all clinical situations.     eGFR's persistently <90 mL/min signify possible Chronic Kidney     Disease.  TSH     Status: None   Collection Time    09/27/13  4:00 AM      Result Value Ref Range   TSH 3.100  0.350 - 4.500 uIU/mL   Comment: Please note change in reference range.  PRO B NATRIURETIC PEPTIDE     Status: None   Collection Time    09/27/13  4:00 AM  Result Value Ref Range   Pro B Natriuretic peptide (BNP) 41.5  0 - 125 pg/mL  HEPARIN LEVEL (UNFRACTIONATED)     Status: Abnormal   Collection Time    09/27/13  4:00 AM      Result Value Ref Range   Heparin Unfractionated <0.10 (*) 0.30 - 0.70 IU/mL   Comment:              IF HEPARIN RESULTS ARE BELOW     EXPECTED VALUES, AND PATIENT     DOSAGE HAS BEEN CONFIRMED,     SUGGEST FOLLOW UP TESTING     OF ANTITHROMBIN III LEVELS.  LIPID PANEL     Status: Abnormal   Collection Time    09/27/13  4:00 AM      Result Value Ref Range   Cholesterol 224 (*) 0 - 200 mg/dL   Triglycerides 287 (*) <150 mg/dL   HDL 58  >39 mg/dL   Total CHOL/HDL Ratio 3.9     VLDL 57 (*) 0 - 40 mg/dL   LDL Cholesterol 109 (*) 0 - 99 mg/dL   Comment:            Total Cholesterol/HDL:CHD Risk     Coronary Heart Disease Risk Table                         Men   Women      1/2 Average Risk   3.4   3.3      Average Risk       5.0   4.4      2 X Average Risk   9.6   7.1      3 X Average Risk  23.4   11.0                Use the calculated Patient Ratio     above and the CHD Risk Table     to determine the patient's CHD Risk.                ATP III CLASSIFICATION (LDL):      <100     mg/dL   Optimal      100-129  mg/dL   Near or Above                        Optimal      130-159  mg/dL   Borderline      160-189  mg/dL   High      >190     mg/dL   Very High    Disposition:      Follow-up Information   Follow up with Pixie Casino, MD. (As needed)    Specialty:  Cardiology   Contact information:   [place] [address] [phone]       Discharge Medications:    Medication List         acetaminophen 325 MG tablet  Commonly known as:  TYLENOL  Take 2 tablets (650 mg total) by mouth every 6 (six) hours as needed for mild pain or headache.     esomeprazole 40 MG capsule  Commonly known as:  NEXIUM  Take 40 mg by mouth daily at 12 noon.     hydrochlorothiazide 25 MG tablet  Commonly known as:  HYDRODIURIL  Take 25 tablets by mouth daily.     ibuprofen 200 MG tablet  Commonly known as:  ADVIL,MOTRIN  Take 600 mg by mouth every 6 (six) hours as needed for moderate pain.     lisinopril 10 MG tablet  Commonly known as:   PRINIVIL,ZESTRIL  Take 10 mg by mouth daily.     MAGNESIUM PO  Take 1 tablet by mouth daily as needed (for cramping). Occasionally     TACLONEX external suspension  Generic drug:  calcipotriene-betamethasone  Apply 1 application topically as needed (for psoriasis).     Vitamin D3 5000 UNITS Tabs  Take 5,000 Units by mouth daily.         Duration of Discharge Encounter: Greater than 30 minutes including physician time.  Angelena Form PA-C 09/27/2013 3:17 PM

## 2013-09-28 ENCOUNTER — Telehealth: Payer: Self-pay | Admitting: Internal Medicine

## 2013-09-28 NOTE — Telephone Encounter (Signed)
Left message for patient. Informed that RN did not see where a f/up time frame was mentioned. Will have schedulers contact patient to set up post-hospital visit if patient sees necessary. (discharge summary stated as needed.)

## 2013-09-28 NOTE — Telephone Encounter (Signed)
Pt out of hospital - when does she need to come back in?

## 2013-09-29 ENCOUNTER — Encounter (HOSPITAL_COMMUNITY): Payer: 59

## 2013-10-13 ENCOUNTER — Telehealth: Payer: Self-pay | Admitting: Internal Medicine

## 2013-10-13 NOTE — Telephone Encounter (Signed)
Closed encounter °

## 2013-11-10 NOTE — Telephone Encounter (Signed)
Encounter complete. 

## 2014-01-02 ENCOUNTER — Other Ambulatory Visit: Payer: Self-pay | Admitting: Gastroenterology

## 2014-01-05 ENCOUNTER — Encounter: Payer: Self-pay | Admitting: Internal Medicine

## 2014-02-07 ENCOUNTER — Ambulatory Visit: Payer: 59 | Admitting: Internal Medicine

## 2014-02-07 ENCOUNTER — Encounter: Payer: Self-pay | Admitting: Internal Medicine

## 2014-03-07 ENCOUNTER — Ambulatory Visit: Payer: 59 | Admitting: Nurse Practitioner

## 2014-03-07 ENCOUNTER — Telehealth: Payer: Self-pay | Admitting: Nurse Practitioner

## 2014-03-07 NOTE — Telephone Encounter (Signed)
Pt canceled appointment for today she is sick and has a very bad cold.  Ok to waive [facility] fee ?

## 2014-03-07 NOTE — Telephone Encounter (Signed)
Ok to wave the fee - we surly don't want her here if sick

## 2014-03-14 ENCOUNTER — Encounter: Payer: Self-pay | Admitting: Internal Medicine

## 2014-03-14 ENCOUNTER — Ambulatory Visit (INDEPENDENT_AMBULATORY_CARE_PROVIDER_SITE_OTHER): Payer: 59 | Admitting: Internal Medicine

## 2014-03-14 VITALS — BP 123/86 | HR 77 | Temp 97.5°F | Ht 61.5 in | Wt 259.0 lb

## 2014-03-14 DIAGNOSIS — K219 Gastro-esophageal reflux disease without esophagitis: Secondary | ICD-10-CM

## 2014-03-14 MED ORDER — ESOMEPRAZOLE MAGNESIUM 40 MG PO CPDR: 40.0000 mg | DELAYED_RELEASE_CAPSULE | Freq: Every day | ORAL | Status: DC

## 2014-03-14 NOTE — Patient Instructions (Signed)
Continue Nexium 40 mg daily  GERD information provided  Office in 1 year

## 2014-03-14 NOTE — Progress Notes (Signed)
Primary Care Physician:  Tula Nakayama Primary Gastroenterologist:  Dr. Gala Romney  Pre-Procedure History & Physical: HPI:  Dawn Hayes is a 43 y.o. female here for followup of GERD. Last seen here about 2 years ago; has been doing very well taking Nexium 40 mg once daily although it was prescribed twice daily. She remains significantly obese and in fact has gained about 12 pounds since her last visit. No dysphagia.  Hasn't had any abdominal pain or change in bowel habits. No blood in the stool. Underwent colonoscopy in 1998 in [place] for reasons which she cannot recall. No family history of colon cancer in any  first relatives.  Past Medical History  Diagnosis Date  . Diverticulitis   . GERD (gastroesophageal reflux disease)   . Psoriatic arthritis   . Dysmenorrhea   . Menorrhagia   . Smoker     quit 111/2013 after 20 years  . Hypertension     Past Surgical History  Procedure Laterality Date  . Esophagogastroduodenoscopy  09/11/99    tiny esophageal erosions with mild erosive reflux/no barrett's/normal stomach  . Colonoscopy  1998    normal, [place]  . Cholecystectomy, laparoscopic  2006    Prior to Admission medications   Medication Sig Start Date End Date Taking? Authorizing Provider  acetaminophen (TYLENOL) 325 MG tablet Take 2 tablets (650 mg total) by mouth every 6 (six) hours as needed for mild pain or headache. 09/27/13  Yes Erlene Quan, PA-C  Cholecalciferol (VITAMIN D3) 5000 UNITS TABS Take 5,000 Units by mouth daily.   Yes Historical Provider, MD  esomeprazole (NEXIUM) 40 MG capsule Take 40 mg by mouth daily at 12 noon.   Yes Historical Provider, MD  hydrochlorothiazide (HYDRODIURIL) 25 MG tablet Take 25 tablets by mouth daily.  12/14/12  Yes Historical Provider, MD  ibuprofen (ADVIL,MOTRIN) 200 MG tablet Take 600 mg by mouth every 6 (six) hours as needed for moderate pain.   Yes Historical Provider, MD  lisinopril (PRINIVIL,ZESTRIL) 10 MG tablet Take 10  mg by mouth daily.   Yes Historical Provider, MD  TACLONEX external suspension Apply 1 application topically as needed (for psoriasis).  09/07/13  Yes Historical Provider, MD  MAGNESIUM PO Take 1 tablet by mouth daily as needed (for cramping). Occasionally    Historical Provider, MD  NEXIUM 40 MG capsule TAKE ONE CAPSULE BY MOUTH TWICE DAILY 01/02/14   Orvil Feil, NP    Allergies as of 03/14/2014 - Review Complete 03/14/2014  Allergen Reaction Noted  . Adhesive [tape] Other (See Comments) 09/26/2013  . Aspirin Nausea Only 12/06/2009    Family History  Problem Relation Age of Onset  . Colon cancer Maternal Grandmother 73  . Brain cancer Father   . Prostate cancer Father   . Hyperlipidemia Mother   . Stroke Paternal Grandfather     COD    History   Social History  . Marital Status: Married    Spouse Name: N/A    Number of Children: 0  . Years of Education: N/A   Occupational History  . [employer]    Social History Main Topics  . Smoking status: Former Smoker -- 0.50 packs/day for 20 years    Types: Cigarettes    Quit date: 03/12/2012  . Smokeless tobacco: Never Used  . Alcohol Use: 5.0 oz/week    10 drink(s) per week     Comment: socially, weekends 4-5  . Drug Use: No  . Sexual Activity:  Partners: Male    Museum/gallery curator: None   Other Topics Concern  . Not on file   Social History Narrative    Review of Systems: See HPI, otherwise negative ROS  Physical Exam: BP 123/86 mmHg  Pulse 77  Temp(Src) 97.5 F (36.4 C) (Oral)  Ht 5' 1.5" (1.562 m)  Wt 259 lb (117.482 kg)  BMI 48.15 kg/m2 General:   Alert,  Well-developed, well-nourished, pleasant and cooperative in NAD Skin:  Intact without significant lesions or rashes. Eyes:  Sclera clear, no icterus.   Conjunctiva pink. Ears:  Normal auditory acuity. Nose:  No deformity, discharge,  or lesions. Mouth:  No deformity or lesions. Neck:  Supple; no masses or thyromegaly. No  significant cervical adenopathy. Lungs:  Clear throughout to auscultation.   No wheezes, crackles, or rhonchi. No acute distress. Heart:  Regular rate and rhythm; no murmurs, clicks, rubs,  or gallops. Abdomen: obese. normal bowel sounds.  Soft and nontender without appreciable mass or hepatosplenomegaly.  Pulses:  Normal pulses noted. Extremities:  Without clubbing or edema.  Impression:  43 year old obese lady with GERD symptoms well controlled on Nexium 40 mg daily. Discussed the multi-pronged approach to reflux including weight loss.  Recommendations:  Continue Nexium 40 mg daily  GERD information provided  Office in 1 year         Notice: This dictation was prepared with Dragon dictation along with smaller phrase technology. Any transcriptional errors that result from this process are unintentional and may not be corrected upon review.

## 2014-04-18 ENCOUNTER — Ambulatory Visit (INDEPENDENT_AMBULATORY_CARE_PROVIDER_SITE_OTHER): Payer: 59 | Admitting: Nurse Practitioner

## 2014-04-18 ENCOUNTER — Encounter: Payer: Self-pay | Admitting: Nurse Practitioner

## 2014-04-18 VITALS — BP 132/88 | HR 92 | Resp 16 | Ht 62.0 in | Wt 258.0 lb

## 2014-04-18 DIAGNOSIS — Z0000 Encounter for general adult medical examination without abnormal findings: Secondary | ICD-10-CM

## 2014-04-18 DIAGNOSIS — Z01419 Encounter for gynecological examination (general) (routine) without abnormal findings: Secondary | ICD-10-CM

## 2014-04-18 LAB — POCT URINALYSIS DIPSTICK
Bilirubin, UA: NEGATIVE
Blood, UA: NEGATIVE
GLUCOSE UA: NEGATIVE
Ketones, UA: NEGATIVE
Nitrite, UA: NEGATIVE
PH UA: 7
Protein, UA: NEGATIVE
UROBILINOGEN UA: NEGATIVE

## 2014-04-18 NOTE — Patient Instructions (Signed)

## 2014-04-18 NOTE — Progress Notes (Signed)
43 y.o. G0P0 Married Caucasian Fe here for annual exam.  Menses is reguar and last 3-4 days.  1 day heavier changing super tampon every 1-2 hours.  Some cramps at every other month.   admission recently for chest pain and pressure that was found to be related to costalchodriasis. No method of birth control secondary to possible infertility issues  Patient's last menstrual period was 04/03/2014 (approximate).          Sexually active: Yes.    The current method of family planning is none.    Exercising: Yes.    Walking and water aerobics 3 x weekly Smoker:  no  Health Maintenance: Pap:  12/27/12 normal with negative HR HPV - no history of abnormal pap MMG: 04/2011 BIRADS1:Neg Colonoscopy:  2002 Normal BMD:   2000 Diverticulitis and GERD TDaP:  2013 Labs: 09/27/13 @Hospital    reports that she quit smoking about 2 years ago. Her smoking use included Cigarettes. She has a 10 pack-year smoking history. She has never used smokeless tobacco. She reports that she drinks about 9.6 oz of alcohol per week. She reports that she does not use illicit drugs.  Past Medical History  Diagnosis Date  . Diverticulitis   . GERD (gastroesophageal reflux disease)   . Psoriatic arthritis   . Dysmenorrhea   . Menorrhagia   . Smoker     quit 111/2013 after 20 years  . Hypertension     Past Surgical History  Procedure Laterality Date  . Esophagogastroduodenoscopy  09/11/99    tiny esophageal erosions with mild erosive reflux/no barrett's/normal stomach  . Colonoscopy  1998    normal, [place]  . Cholecystectomy, laparoscopic  2006    Current Outpatient Prescriptions  Medication Sig Dispense Refill  . acetaminophen (TYLENOL) 325 MG tablet Take 2 tablets (650 mg total) by mouth every 6 (six) hours as needed for mild pain or headache.    . Cholecalciferol (VITAMIN D3) 5000 UNITS TABS Take 5,000 Units by mouth daily.    . hydrochlorothiazide (HYDRODIURIL) 25 MG tablet Take 25 tablets by mouth  daily.     Marland Kitchen ibuprofen (ADVIL,MOTRIN) 200 MG tablet Take 600 mg by mouth every 6 (six) hours as needed for moderate pain.    Marland Kitchen lisinopril (PRINIVIL,ZESTRIL) 10 MG tablet Take 10 mg by mouth daily.    Marland Kitchen NEXIUM 40 MG capsule TAKE ONE CAPSULE BY MOUTH TWICE DAILY 60 capsule 3  . TACLONEX external suspension Apply 1 application topically as needed (for psoriasis).      No current facility-administered medications for this visit.    Family History  Problem Relation Age of Onset  . Colon cancer Maternal Grandmother 65  . Brain cancer Father   . Prostate cancer Father   . Hyperlipidemia Mother   . Stroke Paternal Grandfather     COD    ROS:  Pertinent items are noted in HPI.  Otherwise, a comprehensive ROS was negative.  Exam:   BP 132/88 mmHg  Pulse 92  Resp 16  Ht 5\' 2"  (1.575 m)  Wt 258 lb (117.028 kg)  BMI 47.18 kg/m2  LMP 04/03/2014 (Approximate) Height: 5\' 2"  (157.5 cm)  Ht Readings from Last 3 Encounters:  04/18/14 5\' 2"  (1.575 m)  03/14/14 5' 1.5" (1.562 m)  09/26/13 5\' 1"  (1.549 m)    General appearance: alert, cooperative and appears stated age Head: Normocephalic, without obvious abnormality, atraumatic Neck: no adenopathy, supple, symmetrical, trachea midline and thyroid normal to inspection and palpation Lungs: clear to  auscultation bilaterally Breasts: normal appearance, no masses or tenderness Heart: regular rate and rhythm Abdomen: soft, non-tender; no masses,  no organomegaly Extremities: extremities normal, atraumatic, no cyanosis or edema Skin: Skin color, texture, turgor normal. No rashes or lesions Lymph nodes: Cervical, supraclavicular, and axillary nodes normal. No abnormal inguinal nodes palpated Neurologic: Grossly normal   Pelvic: External genitalia:  no lesions              Urethra:  normal appearing urethra with no masses, tenderness or lesions              Bartholin's and Skene's: normal                 Vagina: normal appearing vagina with  normal color and discharge, no lesions              Cervix: anteverted              Pap taken: No. Bimanual Exam:  Uterus:  normal size, contour, position, consistency, mobility, non-tender              Adnexa: no mass, fullness, tenderness               Rectovaginal: Confirms               Anus:  normal sphincter tone, no lesions  A:  Well Woman with normal exam  Possible history of infertility - no method of birth control for years  Psoriasis  HTN    P:   Reviewed health and wellness pertinent to exam  Pap smear not taken today  Mammogram is due and will schedule  Counseled on breast self exam, mammography screening, adequate intake of calcium and vitamin D, diet and exercise, Kegel's exercises return annually or prn  An After Visit Summary was printed and given to the patient.

## 2014-04-23 NOTE — Progress Notes (Signed)
Encounter reviewed by Dr. Brook Silva.  

## 2014-06-12 ENCOUNTER — Other Ambulatory Visit: Payer: Self-pay

## 2014-06-12 DIAGNOSIS — Z1231 Encounter for screening mammogram for malignant neoplasm of breast: Secondary | ICD-10-CM

## 2014-06-13 LAB — BASIC METABOLIC PANEL
BUN: 16 mg/dL (ref 4–21)
CREATININE: 0.6 mg/dL (ref <=1.1)
GLUCOSE: 90 mg/dL
Potassium: 4.4 mmol/L (ref 3.4–5.3)
Sodium: 137 mmol/L (ref 137–147)

## 2014-06-13 LAB — CBC AND DIFFERENTIAL
HCT: 35 % — AB (ref 36–46)
Hemoglobin: 11.1 g/dL — AB (ref 12.0–16.0)
PLATELETS: 373 10*3/uL (ref 150–399)
WBC: 8.9 10^3/mL

## 2014-06-13 LAB — HEPATIC FUNCTION PANEL
ALK PHOS: 84 U/L (ref 25–125)
ALT: 42 U/L — AB (ref 7–35)
AST: 33 U/L (ref 13–35)
Bilirubin, Total: 0.2 mg/dL

## 2014-06-13 LAB — TSH: TSH: 1.73 u[IU]/mL (ref <=5.90)

## 2014-06-19 ENCOUNTER — Ambulatory Visit
Admission: RE | Admit: 2014-06-19 | Discharge: 2014-06-19 | Disposition: A | Discharge status: Home / Self Care | Payer: 59 | Source: Ambulatory Visit

## 2014-06-19 DIAGNOSIS — Z1231 Encounter for screening mammogram for malignant neoplasm of breast: Secondary | ICD-10-CM

## 2014-08-27 ENCOUNTER — Encounter (HOSPITAL_COMMUNITY): Payer: Self-pay | Admitting: Emergency Medicine

## 2014-08-27 ENCOUNTER — Emergency Department (HOSPITAL_COMMUNITY): Payer: 59

## 2014-08-27 ENCOUNTER — Emergency Department (HOSPITAL_COMMUNITY)
Admission: EM | Admit: 2014-08-27 | Discharge: 2014-08-27 | Disposition: A | Discharge status: Home / Self Care | Payer: 59 | Attending: Emergency Medicine | Admitting: Emergency Medicine

## 2014-08-27 DIAGNOSIS — Z79899 Other long term (current) drug therapy: Secondary | ICD-10-CM | POA: Insufficient documentation

## 2014-08-27 DIAGNOSIS — R0602 Shortness of breath: Secondary | ICD-10-CM

## 2014-08-27 DIAGNOSIS — R079 Chest pain, unspecified: Secondary | ICD-10-CM | POA: Diagnosis present

## 2014-08-27 DIAGNOSIS — I10 Essential (primary) hypertension: Secondary | ICD-10-CM | POA: Insufficient documentation

## 2014-08-27 DIAGNOSIS — Z8742 Personal history of other diseases of the female genital tract: Secondary | ICD-10-CM | POA: Diagnosis not present

## 2014-08-27 DIAGNOSIS — M1990 Unspecified osteoarthritis, unspecified site: Secondary | ICD-10-CM | POA: Diagnosis not present

## 2014-08-27 DIAGNOSIS — K219 Gastro-esophageal reflux disease without esophagitis: Secondary | ICD-10-CM | POA: Diagnosis not present

## 2014-08-27 DIAGNOSIS — Z720 Tobacco use: Secondary | ICD-10-CM | POA: Insufficient documentation

## 2014-08-27 LAB — BASIC METABOLIC PANEL
Anion gap: 14 (ref 5–15)
BUN: 13 mg/dL (ref 6–23)
CALCIUM: 9.1 mg/dL (ref 8.4–10.5)
CO2: 23 mmol/L (ref 19–32)
CREATININE: 0.71 mg/dL (ref 0.50–1.10)
Chloride: 99 mmol/L (ref 96–112)
GFR calc non Af Amer: >90 mL/min (ref >90)
Glucose, Bld: 116 mg/dL — ABNORMAL HIGH (ref 70–99)
Potassium: 4 mmol/L (ref 3.5–5.1)
Sodium: 136 mmol/L (ref 135–145)

## 2014-08-27 LAB — I-STAT TROPONIN, ED: Troponin i, poc: 0 ng/mL (ref 0.00–0.08)

## 2014-08-27 LAB — CBC
HEMATOCRIT: 33.4 % — AB (ref 36.0–46.0)
Hemoglobin: 10.6 g/dL — ABNORMAL LOW (ref 12.0–15.0)
MCH: 26.7 pg (ref 26.0–34.0)
MCHC: 31.7 g/dL (ref 30.0–36.0)
MCV: 84.1 fL (ref 78.0–100.0)
PLATELETS: 304 10*3/uL (ref 150–400)
RBC: 3.97 MIL/uL (ref 3.87–5.11)
RDW: 15.9 % — AB (ref 11.5–15.5)
WBC: 13.7 10*3/uL — AB (ref 4.0–10.5)

## 2014-08-27 LAB — BRAIN NATRIURETIC PEPTIDE: B Natriuretic Peptide: 55.6 pg/mL (ref 0.0–100.0)

## 2014-08-27 MED ORDER — OXYCODONE-ACETAMINOPHEN 5-325 MG PO TABS: 1.0000 | ORAL_TABLET | ORAL | Status: DC | PRN

## 2014-08-27 MED ORDER — MORPHINE SULFATE 4 MG/ML IJ SOLN
4.0000 mg | Freq: Once | INTRAMUSCULAR | Status: AC
Start: 2014-08-27 — End: 2014-08-27
  Administered 2014-08-27: 4 mg via INTRAVENOUS
  Filled 2014-08-27: qty 1

## 2014-08-27 MED ORDER — ONDANSETRON HCL 4 MG/2ML IJ SOLN
4.0000 mg | Freq: Once | INTRAMUSCULAR | Status: AC
  Administered 2014-08-27: 4 mg via INTRAVENOUS
  Filled 2014-08-27: qty 2

## 2014-08-27 MED ORDER — HYDROMORPHONE HCL 1 MG/ML IJ SOLN
1.0000 mg | Freq: Once | INTRAMUSCULAR | Status: AC
  Administered 2014-08-27: 1 mg via INTRAVENOUS
  Filled 2014-08-27: qty 1

## 2014-08-27 MED ORDER — NAPROXEN 500 MG PO TABS: 500.0000 mg | ORAL_TABLET | Freq: Two times a day (BID) | ORAL | Status: DC

## 2014-08-27 MED ORDER — METHYLPREDNISOLONE SODIUM SUCC 125 MG IJ SOLR
125.0000 mg | Freq: Once | INTRAMUSCULAR | Status: AC
  Administered 2014-08-27: 125 mg via INTRAVENOUS
  Filled 2014-08-27: qty 2

## 2014-08-27 MED ORDER — GI COCKTAIL ~~LOC~~
30.0000 mL | Freq: Once | ORAL | Status: AC
  Administered 2014-08-27: 30 mL via ORAL
  Filled 2014-08-27: qty 30

## 2014-08-27 MED ORDER — KETOROLAC TROMETHAMINE 30 MG/ML IJ SOLN
30.0000 mg | Freq: Once | INTRAMUSCULAR | Status: AC
  Administered 2014-08-27: 30 mg via INTRAVENOUS
  Filled 2014-08-27: qty 1

## 2014-08-27 NOTE — ED Notes (Signed)
Pt c/o chest pain with shortness of breath onset Thursday. Pt reports unable to lay down. Symptoms became worse last night. Pt seen here in past with same symptoms and was told she had costochondritis.

## 2014-08-27 NOTE — ED Provider Notes (Signed)
CSN: [id number]     Arrival date & time 08/27/14  0827 History   First MD Initiated Contact with Patient 08/27/14 [id number]     Chief Complaint  Patient presents with  . Shortness of Breath  . Chest Pain     (Consider location/radiation/quality/duration/timing/severity/associated sxs/prior Treatment) The history is provided by the patient and medical records.    This is a 44 year old female with past medical history significant for GERD, hypertension, dysmenorrhea, presenting to the ED for chest pain and shortness of breath that began 4 days ago.  Patient states pain is described as constant and sharp, localized to her mid-sternal region. Patient states she is having a hard time getting comfortable due to pain.  Pain also makes her feel SOB.  Pain worse when taking a deep breath, laying flat, or when pressure applied to chest wall. Patient has no known cardiac history. She was admitted in May 2015 for similar complaints. She was ruled out for PE and pericardial effusion, had a negative echo and nuclear stress test and was cleared from cardiology service with FU as needed.  States she was told she likely had costochondritis.  States her symptoms now feel similar.  Patient is a daily smoker.  No recent cough, congestion, fever, chills, sweats.  No recent travel, LE edema, calf pain, or exogenous estrogens.  No hx of DVT or PE.    Past Medical History  Diagnosis Date  . Diverticulitis   . GERD (gastroesophageal reflux disease)   . Psoriatic arthritis   . Dysmenorrhea   . Menorrhagia   . Smoker     quit 111/2013 after 20 years  . Hypertension    Past Surgical History  Procedure Laterality Date  . Esophagogastroduodenoscopy  09/11/99    tiny esophageal erosions with mild erosive reflux/no barrett's/normal stomach  . Colonoscopy  1998    normal, [place]  . Cholecystectomy, laparoscopic  2006   Family History  Problem Relation Age of Onset  . Colon cancer Maternal Grandmother 41  . Brain  cancer Father   . Prostate cancer Father   . Hyperlipidemia Mother   . Stroke Paternal Grandfather     COD   History  Substance Use Topics  . Smoking status: Current Every Day Smoker -- 0.50 packs/day for 20 years    Types: Cigarettes    Last Attempt to Quit: 03/12/2012  . Smokeless tobacco: Never Used  . Alcohol Use: 9.6 oz/week    6 Glasses of wine, 10 Standard drinks or equivalent per week     Comment: socially, weekends 4-5   OB History    Gravida Para Term Preterm AB TAB SAB Ectopic Multiple Living   0              Review of Systems  Respiratory: Positive for shortness of breath.   Cardiovascular: Positive for chest pain.  All other systems reviewed and are negative.     Allergies  Adhesive and Aspirin  Home Medications   Prior to Admission medications   Medication Sig Start Date End Date Taking? Authorizing Provider  acetaminophen (TYLENOL) 325 MG tablet Take 2 tablets (650 mg total) by mouth every 6 (six) hours as needed for mild pain or headache. 09/27/13   Erlene Quan, PA-C  Cholecalciferol (VITAMIN D3) 5000 UNITS TABS Take 5,000 Units by mouth daily.    Historical Provider, MD  hydrochlorothiazide (HYDRODIURIL) 25 MG tablet Take 25 tablets by mouth daily.  12/14/12   Historical Provider, MD  ibuprofen (ADVIL,MOTRIN)  200 MG tablet Take 600 mg by mouth every 6 (six) hours as needed for moderate pain.    Historical Provider, MD  lisinopril (PRINIVIL,ZESTRIL) 10 MG tablet Take 10 mg by mouth daily.    Historical Provider, MD  NEXIUM 40 MG capsule TAKE ONE CAPSULE BY MOUTH TWICE DAILY 01/02/14   Orvil Feil, NP  [facility] external suspension Apply 1 application topically as needed (for psoriasis).  09/07/13   Historical Provider, MD   BP 108/83 mmHg  Pulse 102  Temp(Src) 97.9 F (36.6 C) (Oral)  Resp 24  Ht 5' 1.5" (1.562 m)  SpO2 98%  LMP 08/24/2014 (Approximate)   Physical Exam  Constitutional: She is oriented to person, place, and time. She appears  well-developed and well-nourished. No distress.  HENT:  Head: Normocephalic and atraumatic.  Mouth/Throat: Oropharynx is clear and moist.  Eyes: Conjunctivae and EOM are normal. Pupils are equal, round, and reactive to light.  Neck: Normal range of motion. Neck supple.  Cardiovascular: Normal rate, regular rhythm and normal heart sounds.   Pulmonary/Chest: Effort normal and breath sounds normal. No accessory muscle usage. No tachypnea. No respiratory distress. She has no wheezes. She has no rhonchi. She has no rales.  Shallow breathing without distress; no audible wheezes, rhonchi, or rales; O2 sats 97% on RA  Abdominal: Soft. Bowel sounds are normal. There is no tenderness. There is no guarding.  Musculoskeletal: Normal range of motion. She exhibits no edema.  No calf asymmetry, tenderness, or palpable cords; no overlying skin changes or warmth to touch  Neurological: She is alert and oriented to person, place, and time.  Skin: Skin is warm and dry. She is not diaphoretic.  Psychiatric: She has a normal mood and affect.  Nursing note and vitals reviewed.   ED Course  Procedures (including critical care time) Labs Review Labs Reviewed  CBC - Abnormal; Notable for the following:    WBC 13.7 (*)    Hemoglobin 10.6 (*)    HCT 33.4 (*)    RDW 15.9 (*)    All other components within normal limits  BASIC METABOLIC PANEL - Abnormal; Notable for the following:    Glucose, Bld 116 (*)    All other components within normal limits  BRAIN NATRIURETIC PEPTIDE  I-STAT TROPOININ, ED    Imaging Review Dg Chest 2 View  08/27/2014   CLINICAL DATA:  44 year old female with a history of chest pain and shortness of breath.  EXAM: CHEST - 2 VIEW  COMPARISON:  Chest x-ray 09/26/2013, CT 09/26/2013  FINDINGS: Cardiomediastinal silhouette projects within normal limits in size and contour. No confluent airspace disease, pneumothorax, or pleural effusion.  No displaced fracture.  Unremarkable appearance of  the upper abdomen.  IMPRESSION: No radiographic evidence of acute cardiopulmonary disease.  Signed,  Dulcy Fanny. Earleen Newport, DO  [facility]  [facility]   Electronically Signed   By: Corrie Mckusick D.O.   On: 08/27/2014 09:20     EKG Interpretation   Date/Time:  Sunday August 27 2014 08:32:27 EDT Ventricular Rate:  104 PR Interval:  140 QRS Duration: 82 QT Interval:  358 QTC Calculation: 470 R Axis:   2 Text Interpretation:  Sinus tachycardia Low voltage QRS Abnormal ECG SINCE  LAST TRACING HEART RATE HAS INCREASED Confirmed by RAY MD, Andee Poles  ([id number]) on 08/27/2014 11:55:15 AM      MDM   Final diagnoses:  Chest pain  Shortness of breath   44 y.o. F with 4  days of midsternal chest pain and shortness of breath. Symptoms worsened with deep breathing, lying flat, and when pressure applied to sternum.  Similar symptoms last May when she was told she likely had costochondritis. She was admitted to the hospital at that time, ruled out for PE and pericardial effusion.  She had a negative echo and nuclear stress test. She was cleared from cardiology standpoint. On exam, patient breathing shallow but she is not tachypneic or hypoxic.  She has reproducible pain of her sternum without bony deformity.  EKG with sinus tachycardia, no acute ischemic changes. Lab work is reassuring, troponin negative. Chest x-ray is clear.  Patient was given multiple rounds of pain medication with significant improvement of her symptoms. She states she is feeling better and is ready to go home. Low-grade tachycardia has resolved, heart rate now well within normal range. Patient has no noted risk factors or hx of PE, low probability prior Wells criteria.  Given reproducible nature of patient's pain and negative work-up here, lower suspicion for ACS, PE, dissection, or other acute cardiac event. Patient will be discharged home with anti-inflammatories and pain medication. She is  instructed to follow-up with her cardiologist and/or PCP.  Discussed plan with patient, he/she acknowledged understanding and agreed with plan of care.  Return precautions given for new or worsening symptoms.  Larene Pickett, PA-C 08/27/14 1425  Pattricia Boss, MD 08/27/14 [phone]

## 2014-08-27 NOTE — ED Notes (Signed)
Pt addressed, in gown, on monitor, continuous pulse oximetry and blood pressure cuff; visitor at bedside

## 2014-08-27 NOTE — ED Notes (Signed)
Pt on cell phone texting, visitor at bedside; pt stated she is ready to go home; will let [place], [place] aware

## 2014-08-27 NOTE — Discharge Instructions (Signed)
Take the prescribed medication as directed. Follow-up with Dr. Debara Pickett-- call to schedule appt. Return to the ED for new or worsening symptoms.

## 2014-08-27 NOTE — ED Notes (Signed)
Pt has returned from being out of the department; IV team at bedside

## 2014-12-04 LAB — HM PAP SMEAR

## 2014-12-29 ENCOUNTER — Other Ambulatory Visit: Payer: Self-pay | Admitting: Orthopedic Surgery

## 2015-01-10 ENCOUNTER — Encounter (HOSPITAL_BASED_OUTPATIENT_CLINIC_OR_DEPARTMENT_OTHER): Payer: Self-pay | Admitting: *Deleted

## 2015-01-12 ENCOUNTER — Encounter (HOSPITAL_BASED_OUTPATIENT_CLINIC_OR_DEPARTMENT_OTHER)
Admission: RE | Admit: 2015-01-12 | Discharge: 2015-01-12 | Disposition: A | Discharge status: Home / Self Care | Payer: 59 | Source: Ambulatory Visit | Attending: Orthopedic Surgery | Admitting: Orthopedic Surgery

## 2015-01-12 DIAGNOSIS — F17210 Nicotine dependence, cigarettes, uncomplicated: Secondary | ICD-10-CM | POA: Diagnosis not present

## 2015-01-12 DIAGNOSIS — I10 Essential (primary) hypertension: Secondary | ICD-10-CM | POA: Diagnosis not present

## 2015-01-12 DIAGNOSIS — G5602 Carpal tunnel syndrome, left upper limb: Secondary | ICD-10-CM | POA: Diagnosis not present

## 2015-01-12 LAB — BASIC METABOLIC PANEL
ANION GAP: 9 (ref 5–15)
BUN: 11 mg/dL (ref 6–20)
CO2: 26 mmol/L (ref 22–32)
Calcium: 9.4 mg/dL (ref 8.9–10.3)
Chloride: 100 mmol/L — ABNORMAL LOW (ref 101–111)
Creatinine, Ser: 0.66 mg/dL (ref 0.44–1.00)
Glucose, Bld: 95 mg/dL (ref 65–99)
Potassium: 4.2 mmol/L (ref 3.5–5.1)
SODIUM: 135 mmol/L (ref 135–145)

## 2015-01-12 NOTE — Progress Notes (Signed)
Anesthesia consult by Rodman Comp, ok for surgery at surgery center

## 2015-01-19 ENCOUNTER — Ambulatory Visit (HOSPITAL_BASED_OUTPATIENT_CLINIC_OR_DEPARTMENT_OTHER): Payer: 59 | Admitting: Anesthesiology

## 2015-01-19 ENCOUNTER — Ambulatory Visit (HOSPITAL_BASED_OUTPATIENT_CLINIC_OR_DEPARTMENT_OTHER)
Admission: RE | Admit: 2015-01-19 | Discharge: 2015-01-19 | Disposition: A | Discharge status: Home / Self Care | Payer: 59 | Source: Ambulatory Visit | Attending: Orthopedic Surgery | Admitting: Orthopedic Surgery

## 2015-01-19 ENCOUNTER — Encounter (HOSPITAL_BASED_OUTPATIENT_CLINIC_OR_DEPARTMENT_OTHER): Payer: Self-pay | Admitting: *Deleted

## 2015-01-19 ENCOUNTER — Encounter (HOSPITAL_BASED_OUTPATIENT_CLINIC_OR_DEPARTMENT_OTHER)
Admission: RE | Disposition: A | Discharge status: Home / Self Care | Payer: Self-pay | Source: Ambulatory Visit | Attending: Orthopedic Surgery

## 2015-01-19 DIAGNOSIS — F17210 Nicotine dependence, cigarettes, uncomplicated: Secondary | ICD-10-CM | POA: Insufficient documentation

## 2015-01-19 DIAGNOSIS — I10 Essential (primary) hypertension: Secondary | ICD-10-CM | POA: Insufficient documentation

## 2015-01-19 DIAGNOSIS — G5602 Carpal tunnel syndrome, left upper limb: Secondary | ICD-10-CM | POA: Diagnosis not present

## 2015-01-19 HISTORY — PX: CARPAL TUNNEL RELEASE: SHX101

## 2015-01-19 SURGERY — CARPAL TUNNEL RELEASE
Anesthesia: Monitor Anesthesia Care | Site: Hand | Laterality: Left

## 2015-01-19 MED ORDER — MIDAZOLAM HCL 5 MG/5ML IJ SOLN
INTRAMUSCULAR | Status: DC | PRN
  Administered 2015-01-19: 1 mg via INTRAVENOUS

## 2015-01-19 MED ORDER — MIDAZOLAM HCL 2 MG/2ML IJ SOLN
INTRAMUSCULAR | Status: AC
  Filled 2015-01-19: qty 4

## 2015-01-19 MED ORDER — LACTATED RINGERS IV SOLN
INTRAVENOUS | Status: DC
  Administered 2015-01-19: 07:00:00 via INTRAVENOUS

## 2015-01-19 MED ORDER — SCOPOLAMINE 1 MG/3DAYS TD PT72: 1.0000 | MEDICATED_PATCH | Freq: Once | TRANSDERMAL | Status: DC | PRN

## 2015-01-19 MED ORDER — FENTANYL CITRATE (PF) 100 MCG/2ML IJ SOLN
INTRAMUSCULAR | Status: DC | PRN
  Administered 2015-01-19: 50 ug via INTRAVENOUS

## 2015-01-19 MED ORDER — SODIUM CHLORIDE 0.45 % IV SOLN: INTRAVENOUS | Status: DC

## 2015-01-19 MED ORDER — CEFAZOLIN SODIUM-DEXTROSE 2-3 GM-% IV SOLR
INTRAVENOUS | Status: AC
  Filled 2015-01-19: qty 50

## 2015-01-19 MED ORDER — PROMETHAZINE HCL 25 MG/ML IJ SOLN: 6.2500 mg | INTRAMUSCULAR | Status: DC | PRN

## 2015-01-19 MED ORDER — SODIUM BICARBONATE 4 % IV SOLN
INTRAVENOUS | Status: DC | PRN
  Administered 2015-01-19: 22 mL via INTRAMUSCULAR

## 2015-01-19 MED ORDER — GLYCOPYRROLATE 0.2 MG/ML IJ SOLN: 0.2000 mg | Freq: Once | INTRAMUSCULAR | Status: DC | PRN

## 2015-01-19 MED ORDER — BUPIVACAINE HCL (PF) 0.25 % IJ SOLN
INTRAMUSCULAR | Status: AC
  Filled 2015-01-19: qty 150

## 2015-01-19 MED ORDER — ONDANSETRON HCL 4 MG/2ML IJ SOLN
INTRAMUSCULAR | Status: DC | PRN
  Administered 2015-01-19: 4 mg via INTRAVENOUS

## 2015-01-19 MED ORDER — SODIUM BICARBONATE 4 % IV SOLN
INTRAVENOUS | Status: AC
  Filled 2015-01-19: qty 25

## 2015-01-19 MED ORDER — FENTANYL CITRATE (PF) 100 MCG/2ML IJ SOLN: 25.0000 ug | INTRAMUSCULAR | Status: DC | PRN

## 2015-01-19 MED ORDER — CHLORHEXIDINE GLUCONATE 4 % EX LIQD: 60.0000 mL | Freq: Once | CUTANEOUS | Status: DC

## 2015-01-19 MED ORDER — LIDOCAINE HCL (PF) 1 % IJ SOLN
INTRAMUSCULAR | Status: AC
  Filled 2015-01-19: qty 10

## 2015-01-19 MED ORDER — HYDROCODONE-ACETAMINOPHEN 5-325 MG PO TABS: 2.0000 | ORAL_TABLET | ORAL | Status: DC | PRN

## 2015-01-19 MED ORDER — CEFAZOLIN SODIUM-DEXTROSE 2-3 GM-% IV SOLR
2.0000 g | INTRAVENOUS | Status: AC
  Administered 2015-01-19: 2 g via INTRAVENOUS

## 2015-01-19 MED ORDER — FENTANYL CITRATE (PF) 100 MCG/2ML IJ SOLN
INTRAMUSCULAR | Status: AC
  Filled 2015-01-19: qty 4

## 2015-01-19 MED ORDER — LIDOCAINE HCL (PF) 1 % IJ SOLN
INTRAMUSCULAR | Status: AC
  Filled 2015-01-19: qty 25

## 2015-01-19 SURGICAL SUPPLY — 44 items
BANDAGE ELASTIC 3 VELCRO ST LF (GAUZE/BANDAGES/DRESSINGS) ×3 IMPLANT
BLADE CARPAL TUNNEL SNGL USE (BLADE) ×3 IMPLANT
BLADE SURG 15 STRL LF DISP TIS (BLADE) ×2 IMPLANT
BLADE SURG 15 STRL SS (BLADE) ×6
BNDG CONFORM 3 STRL LF (GAUZE/BANDAGES/DRESSINGS) ×3 IMPLANT
BRUSH SCRUB EZ PLAIN DRY (MISCELLANEOUS) ×3 IMPLANT
CORDS BIPOLAR (ELECTRODE) ×3 IMPLANT
COVER BACK TABLE 60X90IN (DRAPES) ×3 IMPLANT
CUFF TOURNIQUET SINGLE 18IN (TOURNIQUET CUFF) IMPLANT
DRAIN PENROSE 1/4X12 LTX STRL (WOUND CARE) IMPLANT
DRAPE EXTREMITY T 121X128X90 (DRAPE) ×3 IMPLANT
DRAPE SURG 17X23 STRL (DRAPES) ×3 IMPLANT
DRSG EMULSION OIL 3X3 NADH (GAUZE/BANDAGES/DRESSINGS) ×3 IMPLANT
GAUZE SPONGE 4X4 12PLY STRL (GAUZE/BANDAGES/DRESSINGS) IMPLANT
GAUZE SPONGE 4X4 16PLY XRAY LF (GAUZE/BANDAGES/DRESSINGS) IMPLANT
GAUZE XEROFORM 1X8 LF (GAUZE/BANDAGES/DRESSINGS) ×3 IMPLANT
GLOVE BIOGEL M STRL SZ7.5 (GLOVE) ×3 IMPLANT
GLOVE SS BIOGEL STRL SZ 8 (GLOVE) ×1 IMPLANT
GLOVE SUPERSENSE BIOGEL SZ 8 (GLOVE) ×2
GOWN STRL REUS W/ TWL LRG LVL3 (GOWN DISPOSABLE) ×1 IMPLANT
GOWN STRL REUS W/ TWL XL LVL3 (GOWN DISPOSABLE) ×1 IMPLANT
GOWN STRL REUS W/TWL LRG LVL3 (GOWN DISPOSABLE) ×2
GOWN STRL REUS W/TWL XL LVL3 (GOWN DISPOSABLE) ×2
LOOP VESSEL MAXI BLUE (MISCELLANEOUS) IMPLANT
NDL SAFETY ECLIPSE 18X1.5 (NEEDLE) ×2 IMPLANT
NEEDLE HYPO 18GX1.5 SHARP (NEEDLE) ×6
NEEDLE HYPO 22GX1.5 SAFETY (NEEDLE) IMPLANT
NEEDLE HYPO 25X1 1.5 SAFETY (NEEDLE) ×6 IMPLANT
NS IRRIG 1000ML POUR BTL (IV SOLUTION) ×3 IMPLANT
PACK BASIN DAY SURGERY FS (CUSTOM PROCEDURE TRAY) ×3 IMPLANT
PAD ALCOHOL SWAB (MISCELLANEOUS) ×24 IMPLANT
PAD CAST 3X4 CTTN HI CHSV (CAST SUPPLIES) ×2 IMPLANT
PADDING CAST ABS 4INX4YD NS (CAST SUPPLIES) ×2
PADDING CAST ABS COTTON 4X4 ST (CAST SUPPLIES) ×1 IMPLANT
PADDING CAST COTTON 3X4 STRL (CAST SUPPLIES) ×4
SHEET MEDIUM DRAPE 40X70 STRL (DRAPES) ×3 IMPLANT
STOCKINETTE 4X48 STRL (DRAPES) ×3 IMPLANT
SUT PROLENE 4 0 PS 2 18 (SUTURE) ×3 IMPLANT
SYR BULB 3OZ (MISCELLANEOUS) ×3 IMPLANT
SYR CONTROL 10ML LL (SYRINGE) ×6 IMPLANT
TOWEL OR 17X24 6PK STRL BLUE (TOWEL DISPOSABLE) ×3 IMPLANT
TOWEL OR NON WOVEN STRL DISP B (DISPOSABLE) ×3 IMPLANT
TRAY DSU PREP LF (CUSTOM PROCEDURE TRAY) ×3 IMPLANT
UNDERPAD 30X30 (UNDERPADS AND DIAPERS) ×3 IMPLANT

## 2015-01-19 NOTE — Op Note (Signed)
See dictation [phone]  Patient underwent left carpal tunnel release  Nicoletta Hush M.D.

## 2015-01-19 NOTE — Discharge Instructions (Signed)
We encourage you to take vitamin B 6 200 mg a day or nerve healing purposes. We encourage you to take vitamin C 1000mg  a day for wound healing purposes  Keep bandage clean and dry.  Call for any problems.  No smoking.  Criteria for driving a car: you should be off your pain medicine for 7-8 hours, able to drive one handed(confident), thinking clearly and feeling able in your judgement to drive. Continue elevation as it will decrease swelling.  If instructed by MD move your fingers within the confines of the bandage/splint.  Use ice if instructed by your MD. Call immediately for any sudden loss of feeling in your hand/arm or change in functional abilities of the extremity.We recommend that you to take vitamin C 1000 mg a day to promote healing. We also recommend that if you require  pain medicine that you take a stool softener to prevent constipation as most pain medicines will have constipation side effects. We recommend either Peri-Colace or Senokot and recommend that you also consider adding MiraLAX to prevent the constipation affects from pain medicine if you are required to use them. These medicines are over the counter and maybe purchased at a local pharmacy. A cup of yogurt and a probiotic can also be helpful during the recovery process as the medicines can disrupt your intestinal environment.  Call your surgeon if you experience:   1.  Fever over 101.0. 2.  Inability to urinate. 3.  Nausea and/or vomiting. 4.  Extreme swelling or bruising at the surgical site. 5.  Continued bleeding from the incision. 6.  Increased pain, redness or drainage from the incision. 7.  Problems related to your pain medication. 8. Any change in color, movement and/or sensation 9. Any problems and/or concerns  Post Anesthesia Home Care Instructions  Activity: Get plenty of rest for the remainder of the day. A responsible adult should stay with you for 24 hours following the procedure.  For the next 24 hours, DO  NOT: -Drive a car -Paediatric nurse -Drink alcoholic beverages -Take any medication unless instructed by your physician -Make any legal decisions or sign important papers.  Meals: Start with liquid foods such as gelatin or soup. Progress to regular foods as tolerated. Avoid greasy, spicy, heavy foods. If nausea and/or vomiting occur, drink only clear liquids until the nausea and/or vomiting subsides. Call your physician if vomiting continues.  Special Instructions/Symptoms: Your throat may feel dry or sore from the anesthesia or the breathing tube placed in your throat during surgery. If this causes discomfort, gargle with warm salt water. The discomfort should disappear within 24 hours.   If you had a scopolamine patch placed behind your ear for the management of post- operative nausea and/or vomiting:  1. The medication in the patch is effective for 72 hours, after which it should be removed.  Wrap patch in a tissue and discard in the trash. Wash hands thoroughly with soap and water. 2. You may remove the patch earlier than 72 hours if you experience unpleasant side effects which may include dry mouth, dizziness or visual disturbances. 3. Avoid touching the patch. Wash your hands with soap and water after contact with the patch.

## 2015-01-19 NOTE — Anesthesia Postprocedure Evaluation (Signed)
  Anesthesia Post-op Note  Patient: Dawn Hayes  Procedure(s) Performed: Procedure(s) (LRB): LEFT CARPAL TUNNEL RELEASE (Left)  Patient Location: PACU  Anesthesia Type: MAC  Level of Consciousness: awake and alert   Airway and Oxygen Therapy: Patient Spontanous Breathing  Post-op Pain: mild  Post-op Assessment: Post-op Vital signs reviewed, Patient's Cardiovascular Status Stable, Respiratory Function Stable, Patent Airway and No signs of Nausea or vomiting  Last Vitals:  Filed Vitals:   01/19/15 0908  BP: 116/68  Pulse: 80  Temp: 36.6 C  Resp: 18    Post-op Vital Signs: stable   Complications: No apparent anesthesia complications

## 2015-01-19 NOTE — Anesthesia Procedure Notes (Signed)
Procedure Name: MAC Date/Time: 01/19/2015 7:35 AM Performed by: Munira Polson D Pre-anesthesia Checklist: Patient identified, Emergency Drugs available, Suction available, Patient being monitored and Timeout performed Patient Re-evaluated:Patient Re-evaluated prior to inductionOxygen Delivery Method: Simple face mask

## 2015-01-19 NOTE — Transfer of Care (Signed)
Immediate Anesthesia Transfer of Care Note  Patient: Dawn Hayes  Procedure(s) Performed: Procedure(s): LEFT CARPAL TUNNEL RELEASE (Left)  Patient Location: PACU  Anesthesia Type:MAC  Level of Consciousness: awake, alert , oriented and patient cooperative  Airway & Oxygen Therapy: Patient Spontanous Breathing and Patient connected to face mask oxygen  Post-op Assessment: Report given to RN and Post -op Vital signs reviewed and stable  Post vital signs: Reviewed and stable  Last Vitals:  Filed Vitals:   01/19/15 0640  BP: 123/79  Pulse: 95  Temp: 36.6 C  Resp: 20    Complications: No apparent anesthesia complications

## 2015-01-19 NOTE — Anesthesia Preprocedure Evaluation (Addendum)
Anesthesia Evaluation  Patient identified by MRN, date of birth, ID band Patient awake    Reviewed: Allergy & Precautions, NPO status , Patient's Chart, lab work & pertinent test results  History of Anesthesia Complications Negative for: history of anesthetic complications  Airway Mallampati: III  TM Distance: >3 FB Neck ROM: Full    Dental  (+) Teeth Intact, Dental Advisory Given   Pulmonary Current Smoker,    Pulmonary exam normal breath sounds clear to auscultation       Cardiovascular hypertension, Pt. on medications (-) angina(-) Past MI Normal cardiovascular exam Rhythm:Regular Rate:Normal     Neuro/Psych negative neurological ROS     GI/Hepatic Neg liver ROS, GERD  Medicated,  Endo/Other  Morbid obesity  Renal/GU negative Renal ROS     Musculoskeletal Carpal tunnel syndrome bilateral   Abdominal   Peds  Hematology negative hematology ROS (+) Blood dyscrasia, anemia ,   Anesthesia Other Findings Day of surgery medications reviewed with the patient.  Reproductive/Obstetrics                           Anesthesia Physical Anesthesia Plan  ASA: III  Anesthesia Plan: MAC   Post-op Pain Management:    Induction: Intravenous  Airway Management Planned: Nasal Cannula  Additional Equipment:   Intra-op Plan:   Post-operative Plan:   Informed Consent: I have reviewed the patients History and Physical, chart, labs and discussed the procedure including the risks, benefits and alternatives for the proposed anesthesia with the patient or authorized representative who has indicated his/her understanding and acceptance.   Dental advisory given  Plan Discussed with: CRNA and Anesthesiologist  Anesthesia Plan Comments: (Discussed risks/benefits/alternatives to MAC sedation including need for ventilatory support, hypotension, need for conversion to general anesthesia.  All patient  questions answered.  Patient wished to proceed.)        Anesthesia Quick Evaluation

## 2015-01-19 NOTE — H&P (Signed)
Dawn Hayes is an 44 y.o. female.   Chief Complaint: bilateral CTS HPI: Patient presents for evaluation and treatment of the of their upper extremity predicament. The patient denies neck, back, chest or  abdominal pain. The patient notes that they have no lower extremity problems. The patients primary complaint is noted. We are planning surgical care pathway for the upper extremity.  Plan L CTR  Past Medical History  Diagnosis Date  . Diverticulitis   . GERD (gastroesophageal reflux disease)   . Psoriatic arthritis   . Dysmenorrhea   . Menorrhagia   . Smoker     quit 111/2013 after 20 years  . Hypertension     Past Surgical History  Procedure Laterality Date  . Esophagogastroduodenoscopy  09/11/99    tiny esophageal erosions with mild erosive reflux/no barrett's/normal stomach  . Colonoscopy  1998    normal, [place]  . Cholecystectomy, laparoscopic  2006    Family History  Problem Relation Age of Onset  . Colon cancer Maternal Grandmother 39  . Brain cancer Father   . Prostate cancer Father   . Hyperlipidemia Mother   . Stroke Paternal Grandfather     COD   Social History:  reports that she has been smoking Cigarettes.  She has a 10 pack-year smoking history. She has never used smokeless tobacco. She reports that she drinks about 9.6 oz of alcohol per week. She reports that she does not use illicit drugs.  Allergies:  Allergies  Allergen Reactions  . Adhesive [Tape] Other (See Comments)    Hard to remove, due to psoriatic arthritis  . Aspirin Nausea Only    Medications Prior to Admission  Medication Sig Dispense Refill  . acetaminophen (TYLENOL) 325 MG tablet Take 2 tablets (650 mg total) by mouth every 6 (six) hours as needed for mild pain or headache.    . hydrochlorothiazide (HYDRODIURIL) 25 MG tablet Take 25 tablets by mouth daily.     Marland Kitchen ibuprofen (ADVIL,MOTRIN) 200 MG tablet Take 600 mg by mouth every 6 (six) hours as needed for moderate pain.    Marland Kitchen  lisinopril (PRINIVIL,ZESTRIL) 10 MG tablet Take 10 mg by mouth daily.    . naproxen (NAPROSYN) 500 MG tablet Take 1 tablet (500 mg total) by mouth 2 (two) times daily with a meal. 30 tablet 0  . NEXIUM 40 MG capsule TAKE ONE CAPSULE BY MOUTH TWICE DAILY 60 capsule 3  . oxyCODONE-acetaminophen (PERCOCET/ROXICET) 5-325 MG per tablet Take 1 tablet by mouth every 4 (four) hours as needed. 15 tablet 0    No results found for this or any previous visit (from the past 48 hour(s)). No results found.  Review of Systems  Eyes: Negative.   Respiratory: Negative.   Gastrointestinal: Negative.   Genitourinary: Negative.   Neurological: Negative.   Endo/Heme/Allergies: Negative.     Blood pressure 123/79, pulse 95, temperature 97.9 F (36.6 C), temperature source Oral, resp. rate 20, height 5' 1.5" (1.562 m), weight 118.842 kg (262 lb), last menstrual period 01/03/2015, SpO2 97 %. Physical Exam  Bilateral CTS positive tinels and phalens test BUE about the median nerve The patient is alert and oriented in no acute distress. The patient complains of pain in the affected upper extremity.  The patient is noted to have a normal HEENT exam. Lung fields show equal chest expansion and no shortness of breath. Abdomen exam is nontender without distention. Lower extremity examination does not show any fracture dislocation or blood clot symptoms. Pelvis is stable  and the neck and back are stable and nontender.  Assessment/Plan  plan L CTR We are planning surgery for your upper extremity. The risk and benefits of surgery to include risk of bleeding, infection, anesthesia,  damage to normal structures and failure of the surgery to accomplish its intended goals of relieving symptoms and restoring function have been discussed in detail. With this in mind we plan to proceed. I have specifically discussed with the patient the pre-and postoperative regime and the dos and don'ts and risk and benefits in great detail.  Risk and benefits of surgery also include risk of dystrophy(CRPS), chronic nerve pain, failure of the healing process to go onto completion and other inherent risks of surgery The relavent the pathophysiology of the disease/injury process, as well as the alternatives for treatment and postoperative course of action has been discussed in great detail with the patient who desires to proceed.  We will do everything in our power to help you (the patient) restore function to the upper extremity. It is a pleasure to see this patient today.   Paulene Floor 01/19/2015, 7:32 AM

## 2015-01-22 ENCOUNTER — Encounter (HOSPITAL_BASED_OUTPATIENT_CLINIC_OR_DEPARTMENT_OTHER): Payer: Self-pay | Admitting: Orthopedic Surgery

## 2015-01-22 NOTE — Op Note (Signed)
NAMECATALEA, LABRECQUE NO.:  [id number]  MEDICAL RECORD NO.:  [id number]  LOCATION:                               FACILITY:  [facility]  PHYSICIAN:  Satira Anis. Shiara Mcgough, M.D.DATE OF BIRTH:  07/17/1970  DATE OF PROCEDURE:  01/19/2015 DATE OF DISCHARGE:  01/19/2015                              OPERATIVE REPORT   PREOPERATIVE DIAGNOSIS:  Left carpal tunnel syndrome.  POSTOPERATIVE DIAGNOSIS:  Left carpal tunnel syndrome.  PROCEDURES: 1. Left median nerve/peripheral nerve block wrist from level for     anesthetic purposes for carpal tunnel release. 2. Left limited open carpal tunnel release.  SURGEON:  Satira Anis. Amedeo Plenty, M.D.  ASSISTANT:  None.  COMPLICATIONS:  None.  ANESTHESIA:  Peripheral nerve block with IV sedation keeping the patient awake, alert and oriented the entire case.  TOURNIQUET TIME:  Less than 10 minutes.  INDICATIONS:  Pleasant female, presents to the above-mentioned diagnosis.  I have counseled her in regard to risks and benefits of surgery including risk of infection, bleeding, anesthesia, damage to normal structures and failure of surgery to accomplish its intended goals of relieving symptoms and restoring function.  With this in mind, she desires to proceed.  All questions have been encouraged and answered preoperatively.  OPERATIVE PROCEDURE:  The patient was seen by myself and Anesthesia, underwent smooth induction of peripheral nerve/median nerve block, was prepped and draped in usual sterile fashion.  Then, underwent a very careful and cautious approach to the extremity with final time-out being called and the operation then commenced with tourniquet insufflation. Following this, the left upper extremity underwent a small 1 cm incision, dissection was carried down.  Palmar fascia released.  Distal edge of the transverse carpal ligament was identified and released under 4.0 loupe magnification.  The patient tolerated this well.  Once  the distal release was confirmed, we then dissected in a distal-to-proximal dissection fashion until adequate room was available for canal, prepared toward device 1, 2, and 3, which were placed just under the proximal leading leaflet of transverse carpal ligament.  The patient tolerated this well.  There were no complicating features.  Following this, the security clip was placed.  Obturator disengaged and security knife was placed and security clip effectively releasing the proximal leaflet of transverse carpal ligament.  I inspected the canal.  She was nicely released, looked excellent.  There were no complicating features.  She was awake, alert and oriented during the entire procedure and did beautifully.  Following irrigation with secured hemostasis, closed the wound with Prolene and placed a sterile bandage.  She will be discharged home on Norco p.r.n. pain, elevate, move, massage fingers.  Notify me should problems occur.  I am going to look forward to see her back in the office in 7 days for wound check.     Satira Anis. Amedeo Plenty, M.D.     [facility]  D:  01/19/2015  T:  01/20/2015  Job:  [id number]

## 2015-02-22 ENCOUNTER — Encounter: Payer: Self-pay | Admitting: Internal Medicine

## 2015-02-26 ENCOUNTER — Other Ambulatory Visit: Payer: Self-pay | Admitting: Internal Medicine

## 2015-04-03 ENCOUNTER — Ambulatory Visit: Payer: 59 | Admitting: Gastroenterology

## 2015-04-18 ENCOUNTER — Ambulatory Visit: Payer: 59 | Admitting: Gastroenterology

## 2015-04-18 ENCOUNTER — Encounter: Payer: Self-pay | Admitting: Gastroenterology

## 2015-04-18 ENCOUNTER — Telehealth: Payer: Self-pay | Admitting: Gastroenterology

## 2015-04-18 NOTE — Telephone Encounter (Signed)
PATIENT WAS A NO SHOW AND LETTER SENT  °

## 2015-05-08 ENCOUNTER — Ambulatory Visit (INDEPENDENT_AMBULATORY_CARE_PROVIDER_SITE_OTHER): Payer: Commercial Managed Care - HMO | Admitting: Gastroenterology

## 2015-05-08 ENCOUNTER — Encounter: Payer: Self-pay | Admitting: Gastroenterology

## 2015-05-08 ENCOUNTER — Other Ambulatory Visit: Payer: Self-pay

## 2015-05-08 VITALS — BP 127/75 | HR 99 | Temp 98.3°F | Ht 62.0 in | Wt 265.8 lb

## 2015-05-08 DIAGNOSIS — D649 Anemia, unspecified: Secondary | ICD-10-CM | POA: Insufficient documentation

## 2015-05-08 DIAGNOSIS — K625 Hemorrhage of anus and rectum: Secondary | ICD-10-CM

## 2015-05-08 MED ORDER — PEG-KCL-NACL-NASULF-NA ASC-C 100 G PO SOLR: 1.0000 | Freq: Once | ORAL | Status: AC

## 2015-05-08 MED ORDER — DICYCLOMINE HCL 10 MG PO CAPS: 10.0000 mg | ORAL_CAPSULE | Freq: Three times a day (TID) | ORAL | Status: DC

## 2015-05-08 NOTE — Patient Instructions (Signed)
Please have blood work done today. Complete the stool studies when you are able. You can start now taking Bentyl 1 capsule before meals and at bedtime for loose stool and cramping. Monitor for dry mouth, dizziness.   We have scheduled you for a colonoscopy. An endoscopy will be done IF there is evidence of iron deficiency on your labs.   Further recommendations to follow!  Happy New Year!

## 2015-05-08 NOTE — Progress Notes (Signed)
Referring Provider: Aletha Halim., PA-C Primary Care Physician:  Tula Nakayama  Primary GI: Dr. Gala Romney   Chief Complaint  Patient presents with  . Follow-up    HPI:   Dawn Hayes is a 44 y.o. female presenting today with a history of GERD. Here for 1 year follow-up. Has had some intermittent abdominal discomfort, gas, diarrhea. Denies any constipation. Has started Humira for psoriatic arthritis. Having a BM a few times a day. Baseline used to be once a day, not even every day. Sometimes loose, sometimes soft. Has had a large amount of rectal bleeding and blood clots 2 weeks ago. Saw PCP and told it was hemorrhoids. No rectal discomfort/itching. Pain not improved after bowel movement. Lower abdominal discomfort. Has heavy menses the first 2 days. Takes a probiotic daily. High fat food goes right through her. Change in bowel habits about a month ago. No recent antibiotics.   Past Medical History  Diagnosis Date  . Diverticulitis   . GERD (gastroesophageal reflux disease)   . Psoriatic arthritis ([place])   . Dysmenorrhea   . Menorrhagia   . Smoker   . Hypertension   . Costochondritis     Past Surgical History  Procedure Laterality Date  . Esophagogastroduodenoscopy  09/11/99    tiny esophageal erosions with mild erosive reflux/no barrett's/normal stomach  . Colonoscopy  1998    normal, [place]  . Cholecystectomy, laparoscopic  2006  . Carpal tunnel release Left 01/19/2015    Procedure: LEFT CARPAL TUNNEL RELEASE;  Surgeon: Roseanne Kaufman, MD;  Location: [facility];  Service: Orthopedics;  Laterality: Left;    Current Outpatient Prescriptions  Medication Sig Dispense Refill  . acetaminophen (TYLENOL) 325 MG tablet Take 2 tablets (650 mg total) by mouth every 6 (six) hours as needed for mild pain or headache.    . celecoxib (CELEBREX) 50 MG capsule Take 50 mg by mouth daily.    Marland Kitchen esomeprazole (NEXIUM) 40 MG capsule Take 1 capsule (40 mg total) by  mouth daily before breakfast. 30 capsule 11  . HUMIRA PEN 40 MG/0.8ML PNKT     . hydrochlorothiazide (HYDRODIURIL) 25 MG tablet Take 25 tablets by mouth daily.     Marland Kitchen ibuprofen (ADVIL,MOTRIN) 200 MG tablet Take 600 mg by mouth every 6 (six) hours as needed for moderate pain.    Marland Kitchen lisinopril (PRINIVIL,ZESTRIL) 10 MG tablet Take 10 mg by mouth daily.    . naproxen (NAPROSYN) 500 MG tablet Take 1 tablet (500 mg total) by mouth 2 (two) times daily with a meal. 30 tablet 0   No current facility-administered medications for this visit.    Allergies as of 05/08/2015 - Review Complete 05/08/2015  Allergen Reaction Noted  . Adhesive [tape] Other (See Comments) 09/26/2013  . Aspirin Nausea Only 12/06/2009    Family History  Problem Relation Age of Onset  . Colon cancer Maternal Grandmother 64  . Brain cancer Father   . Prostate cancer Father   . Hyperlipidemia Mother   . Stroke Paternal Grandfather     COD    Social History   Social History  . Marital Status: Married    Spouse Name: N/A  . Number of Children: 0  . Years of Education: N/A   Occupational History  . [employer]    Social History Main Topics  . Smoking status: Current Every Day Smoker -- 0.50 packs/day for 20 years    Types: Cigarettes  . Smokeless tobacco: Never Used  Comment: smoking 3 cigarettes a day, hasn't quit completely   . Alcohol Use: 9.6 oz/week    6 Glasses of wine, 10 Standard drinks or equivalent per week     Comment: socially, weekends 4-5  . Drug Use: No  . Sexual Activity:    Partners: Male    Birth Control/ Protection: None   Other Topics Concern  . None   Social History Narrative    Review of Systems: Gen: feels fatigued   CV: occasional palpitations, feels like heart is beating out of chest occasionally  Resp: +DOE  GI: see HPI  Derm: Denies rash, itching, dry skin Psych: Denies depression, anxiety, memory loss, confusion. No homicidal or suicidal ideation.  Heme:  see HPI.  Physical Exam: BP 127/75 mmHg  Pulse 99  Temp(Src) 98.3 F (36.8 C) (Oral)  Ht 5\' 2"  (1.575 m)  Wt 265 lb 12.8 oz (120.566 kg)  BMI 48.60 kg/m2 General:   Alert and oriented. No distress noted. Pleasant and cooperative.  Head:  Normocephalic and atraumatic. Eyes:  Conjuctiva clear without scleral icterus. Mouth:  Oral mucosa pink and moist. Good dentition. No lesions. Heart:  S1, S2 present without murmurs, rubs, or gallops. Regular rate and rhythm. Abdomen:  +BS, soft, non-tender and non-distended. No rebound or guarding. No HSM or masses noted. Msk:  Symmetrical without gross deformities. Normal posture. Extremities:  Without edema. Neurologic:  Alert and  oriented x4;  grossly normal neurologically. Skin:  Intact without significant lesions or rashes. Psych:  Alert and cooperative. Normal mood and affect.  Lab Results  Component Value Date   WBC 13.7* 08/27/2014   HGB 10.6* 08/27/2014   HCT 33.4* 08/27/2014   MCV 84.1 08/27/2014   PLT 304 08/27/2014

## 2015-05-09 LAB — FERRITIN: Ferritin: 7 ng/mL — ABNORMAL LOW (ref 10–291)

## 2015-05-09 LAB — CBC
HEMATOCRIT: 30.8 % — AB (ref 36.0–46.0)
Hemoglobin: 9.6 g/dL — ABNORMAL LOW (ref 12.0–15.0)
MCH: 23.8 pg — ABNORMAL LOW (ref 26.0–34.0)
MCHC: 31.2 g/dL (ref 30.0–36.0)
MCV: 76.4 fL — AB (ref 78.0–100.0)
MPV: 9.1 fL (ref 8.6–12.4)
Platelets: 384 10*3/uL (ref 150–400)
RBC: 4.03 MIL/uL (ref 3.87–5.11)
RDW: 18 % — AB (ref 11.5–15.5)
WBC: 7.7 10*3/uL (ref 4.0–10.5)

## 2015-05-09 LAB — IRON AND TIBC
%SAT: 4 % — ABNORMAL LOW (ref 11–50)
IRON: 20 ug/dL — AB (ref 40–190)
TIBC: 454 ug/dL — ABNORMAL HIGH (ref 250–450)
UIBC: 434 ug/dL — ABNORMAL HIGH (ref 125–400)

## 2015-05-11 ENCOUNTER — Telehealth: Payer: Self-pay

## 2015-05-11 NOTE — Telephone Encounter (Signed)
Pt is calling for blood work results

## 2015-05-11 NOTE — Assessment & Plan Note (Addendum)
Incidence of large volume hematochezia recently, with intermittent chronic loose stools. More diarrhea than normal noted. No recent antibiotics. WIll order stool studies and also arrange for colonoscopy due to rectal bleeding. Start Bentyl 10 mg before meals and at bedtime. Could  potentially consider trial of Viberzi. However, she does drink alcohol several times during the week, and more than 3 alcoholic drinks/day is not recommended with Viberzi due to the risk of pancreatitis. Therefore, I would rather avoid this agent unless she only sparingly had alcohol.

## 2015-05-11 NOTE — Telephone Encounter (Signed)
She has iron deficiency. Needs to start taking ferous sulfate 325 mg oral BID. May cause constipation, dark stools. She will definitely need the EGD at time of colonoscopy, which is already tentatively scheduled. Hold iron for 7 days prior to procedure.

## 2015-05-11 NOTE — Assessment & Plan Note (Addendum)
44 year old female with evidence of anemia, incidentally noted as I reviewed historical records from outside our practice. Likely a multifactorial etiology in setting of chronic disease and possible IDA component with menstrual cycle. However, recent onset of rectal bleeding warrants colonoscopy with possible EGD if evidence of IDA. Discussed at length with patient. Will order CBC, iron, ferritin, TIBC now. As of note, last colonoscopy in 1998 normal.   Proceed with TCS +/- EGD with Dr. Gala Romney in near future: the risks, benefits, and alternatives have been discussed with the patient in detail. The patient states understanding and desires to proceed. Phenergan 25 mg IV on call due to moderate alcohol use

## 2015-05-11 NOTE — Telephone Encounter (Signed)
Pt is aware of results. 

## 2015-05-12 NOTE — Progress Notes (Signed)
Evidence of IDA on labs. Patient to start iron BID. Hold 7 days prior to procedures. Colonoscopy as planned with EGD.

## 2015-05-15 ENCOUNTER — Telehealth: Payer: Self-pay | Admitting: Internal Medicine

## 2015-05-15 NOTE — Telephone Encounter (Signed)
[phone] PATIENT CALLED INQUIRING ABOUT IRON PILLS.  WAS TOLD LAST TIME SHE WAS HERE TO TAKE THEM AND SHE DID NOT KNOW IF IT WAS PRESCRIBED OR OTC. ALSO NEEDS TO KNOW HOW [facility]

## 2015-05-15 NOTE — Telephone Encounter (Signed)
Spoke with the pt, went over 05/11/15 phone note with her. Advised her to get otc and take 325mg  bid and to hold before procedure.

## 2015-05-16 NOTE — Progress Notes (Signed)
CC'ED TO PCP 

## 2015-05-30 ENCOUNTER — Encounter (HOSPITAL_COMMUNITY): Payer: Self-pay | Admitting: *Deleted

## 2015-05-30 ENCOUNTER — Encounter (HOSPITAL_COMMUNITY)
Admission: RE | Disposition: A | Discharge status: Home Health | Payer: Self-pay | Source: Ambulatory Visit | Attending: Internal Medicine

## 2015-05-30 ENCOUNTER — Ambulatory Visit (HOSPITAL_COMMUNITY)
Admission: RE | Admit: 2015-05-30 | Discharge: 2015-05-30 | Disposition: A | Discharge status: Home Health | Payer: Commercial Managed Care - HMO | Source: Ambulatory Visit | Attending: Internal Medicine | Admitting: Internal Medicine

## 2015-05-30 DIAGNOSIS — K649 Unspecified hemorrhoids: Secondary | ICD-10-CM | POA: Diagnosis not present

## 2015-05-30 DIAGNOSIS — K921 Melena: Secondary | ICD-10-CM | POA: Insufficient documentation

## 2015-05-30 DIAGNOSIS — K449 Diaphragmatic hernia without obstruction or gangrene: Secondary | ICD-10-CM | POA: Diagnosis not present

## 2015-05-30 DIAGNOSIS — I10 Essential (primary) hypertension: Secondary | ICD-10-CM | POA: Diagnosis not present

## 2015-05-30 DIAGNOSIS — K5730 Diverticulosis of large intestine without perforation or abscess without bleeding: Secondary | ICD-10-CM | POA: Diagnosis not present

## 2015-05-30 DIAGNOSIS — Z9049 Acquired absence of other specified parts of digestive tract: Secondary | ICD-10-CM | POA: Insufficient documentation

## 2015-05-30 DIAGNOSIS — Z886 Allergy status to analgesic agent status: Secondary | ICD-10-CM | POA: Diagnosis not present

## 2015-05-30 DIAGNOSIS — K219 Gastro-esophageal reflux disease without esophagitis: Secondary | ICD-10-CM | POA: Diagnosis not present

## 2015-05-30 DIAGNOSIS — F17210 Nicotine dependence, cigarettes, uncomplicated: Secondary | ICD-10-CM | POA: Insufficient documentation

## 2015-05-30 DIAGNOSIS — Z79899 Other long term (current) drug therapy: Secondary | ICD-10-CM | POA: Insufficient documentation

## 2015-05-30 DIAGNOSIS — K625 Hemorrhage of anus and rectum: Secondary | ICD-10-CM

## 2015-05-30 DIAGNOSIS — D509 Iron deficiency anemia, unspecified: Secondary | ICD-10-CM | POA: Insufficient documentation

## 2015-05-30 DIAGNOSIS — Z91048 Other nonmedicinal substance allergy status: Secondary | ICD-10-CM | POA: Insufficient documentation

## 2015-05-30 HISTORY — PX: COLONOSCOPY: SHX5424

## 2015-05-30 HISTORY — PX: ESOPHAGOGASTRODUODENOSCOPY: SHX5428

## 2015-05-30 SURGERY — COLONOSCOPY
Anesthesia: Moderate Sedation

## 2015-05-30 MED ORDER — PROMETHAZINE HCL 25 MG/ML IJ SOLN
INTRAMUSCULAR | Status: AC
  Filled 2015-05-30: qty 1

## 2015-05-30 MED ORDER — PROMETHAZINE HCL 25 MG/ML IJ SOLN
25.0000 mg | Freq: Once | INTRAMUSCULAR | Status: AC
  Administered 2015-05-30: 25 mg via INTRAVENOUS

## 2015-05-30 MED ORDER — MIDAZOLAM HCL 5 MG/5ML IJ SOLN
INTRAMUSCULAR | Status: DC | PRN
  Administered 2015-05-30: 2 mg via INTRAVENOUS
  Administered 2015-05-30: 1 mg via INTRAVENOUS
  Administered 2015-05-30: 2 mg via INTRAVENOUS
  Administered 2015-05-30: 1 mg via INTRAVENOUS

## 2015-05-30 MED ORDER — MIDAZOLAM HCL 5 MG/5ML IJ SOLN
INTRAMUSCULAR | Status: AC
  Filled 2015-05-30: qty 10

## 2015-05-30 MED ORDER — SODIUM CHLORIDE 0.9 % IJ SOLN
INTRAMUSCULAR | Status: AC
  Filled 2015-05-30: qty 3

## 2015-05-30 MED ORDER — MEPERIDINE HCL 100 MG/ML IJ SOLN
INTRAMUSCULAR | Status: DC | PRN
  Administered 2015-05-30: 25 mg via INTRAVENOUS
  Administered 2015-05-30: 50 mg via INTRAVENOUS
  Administered 2015-05-30: 25 mg via INTRAVENOUS

## 2015-05-30 MED ORDER — STERILE WATER FOR IRRIGATION IR SOLN
Status: DC | PRN
  Administered 2015-05-30: 08:00:00

## 2015-05-30 MED ORDER — LIDOCAINE VISCOUS 2 % MT SOLN
OROMUCOSAL | Status: DC | PRN
  Administered 2015-05-30: 1 via OROMUCOSAL

## 2015-05-30 MED ORDER — SODIUM CHLORIDE 0.9 % IV SOLN
INTRAVENOUS | Status: DC
  Administered 2015-05-30: 1000 mL via INTRAVENOUS

## 2015-05-30 MED ORDER — MEPERIDINE HCL 100 MG/ML IJ SOLN
INTRAMUSCULAR | Status: AC
  Filled 2015-05-30: qty 2

## 2015-05-30 MED ORDER — LIDOCAINE VISCOUS 2 % MT SOLN
OROMUCOSAL | Status: AC
  Filled 2015-05-30: qty 15

## 2015-05-30 MED ORDER — ONDANSETRON HCL 4 MG/2ML IJ SOLN
INTRAMUSCULAR | Status: AC
  Filled 2015-05-30: qty 2

## 2015-05-30 MED ORDER — ONDANSETRON HCL 4 MG/2ML IJ SOLN
INTRAMUSCULAR | Status: DC | PRN
  Administered 2015-05-30: 4 mg via INTRAVENOUS

## 2015-05-30 NOTE — H&P (View-Only) (Signed)
Referring Provider: Aletha Halim., PA-C Primary Care Physician:  Tula Nakayama  Primary GI: Dr. Gala Romney   Chief Complaint  Patient presents with  . Follow-up    HPI:   Dawn Hayes is a 45 y.o. female presenting today with a history of GERD. Here for 1 year follow-up. Has had some intermittent abdominal discomfort, gas, diarrhea. Denies any constipation. Has started Humira for psoriatic arthritis. Having a BM a few times a day. Baseline used to be once a day, not even every day. Sometimes loose, sometimes soft. Has had a large amount of rectal bleeding and blood clots 2 weeks ago. Saw PCP and told it was hemorrhoids. No rectal discomfort/itching. Pain not improved after bowel movement. Lower abdominal discomfort. Has heavy menses the first 2 days. Takes a probiotic daily. High fat food goes right through her. Change in bowel habits about a month ago. No recent antibiotics.   Past Medical History  Diagnosis Date  . Diverticulitis   . GERD (gastroesophageal reflux disease)   . Psoriatic arthritis ([place])   . Dysmenorrhea   . Menorrhagia   . Smoker   . Hypertension   . Costochondritis     Past Surgical History  Procedure Laterality Date  . Esophagogastroduodenoscopy  09/11/99    tiny esophageal erosions with mild erosive reflux/no barrett's/normal stomach  . Colonoscopy  1998    normal, [place]  . Cholecystectomy, laparoscopic  2006  . Carpal tunnel release Left 01/19/2015    Procedure: LEFT CARPAL TUNNEL RELEASE;  Surgeon: Roseanne Kaufman, MD;  Location: [facility];  Service: Orthopedics;  Laterality: Left;    Current Outpatient Prescriptions  Medication Sig Dispense Refill  . acetaminophen (TYLENOL) 325 MG tablet Take 2 tablets (650 mg total) by mouth every 6 (six) hours as needed for mild pain or headache.    . celecoxib (CELEBREX) 50 MG capsule Take 50 mg by mouth daily.    Marland Kitchen esomeprazole (NEXIUM) 40 MG capsule Take 1 capsule (40 mg total) by  mouth daily before breakfast. 30 capsule 11  . HUMIRA PEN 40 MG/0.8ML PNKT     . hydrochlorothiazide (HYDRODIURIL) 25 MG tablet Take 25 tablets by mouth daily.     Marland Kitchen ibuprofen (ADVIL,MOTRIN) 200 MG tablet Take 600 mg by mouth every 6 (six) hours as needed for moderate pain.    Marland Kitchen lisinopril (PRINIVIL,ZESTRIL) 10 MG tablet Take 10 mg by mouth daily.    . naproxen (NAPROSYN) 500 MG tablet Take 1 tablet (500 mg total) by mouth 2 (two) times daily with a meal. 30 tablet 0   No current facility-administered medications for this visit.    Allergies as of 05/08/2015 - Review Complete 05/08/2015  Allergen Reaction Noted  . Adhesive [tape] Other (See Comments) 09/26/2013  . Aspirin Nausea Only 12/06/2009    Family History  Problem Relation Age of Onset  . Colon cancer Maternal Grandmother 53  . Brain cancer Father   . Prostate cancer Father   . Hyperlipidemia Mother   . Stroke Paternal Grandfather     COD    Social History   Social History  . Marital Status: Married    Spouse Name: N/A  . Number of Children: 0  . Years of Education: N/A   Occupational History  . [employer]    Social History Main Topics  . Smoking status: Current Every Day Smoker -- 0.50 packs/day for 20 years    Types: Cigarettes  . Smokeless tobacco: Never Used  Comment: smoking 3 cigarettes a day, hasn't quit completely   . Alcohol Use: 9.6 oz/week    6 Glasses of wine, 10 Standard drinks or equivalent per week     Comment: socially, weekends 4-5  . Drug Use: No  . Sexual Activity:    Partners: Male    Birth Control/ Protection: None   Other Topics Concern  . None   Social History Narrative    Review of Systems: Gen: feels fatigued   CV: occasional palpitations, feels like heart is beating out of chest occasionally  Resp: +DOE  GI: see HPI  Derm: Denies rash, itching, dry skin Psych: Denies depression, anxiety, memory loss, confusion. No homicidal or suicidal ideation.  Heme:  see HPI.  Physical Exam: BP 127/75 mmHg  Pulse 99  Temp(Src) 98.3 F (36.8 C) (Oral)  Ht 5\' 2"  (1.575 m)  Wt 265 lb 12.8 oz (120.566 kg)  BMI 48.60 kg/m2 General:   Alert and oriented. No distress noted. Pleasant and cooperative.  Head:  Normocephalic and atraumatic. Eyes:  Conjuctiva clear without scleral icterus. Mouth:  Oral mucosa pink and moist. Good dentition. No lesions. Heart:  S1, S2 present without murmurs, rubs, or gallops. Regular rate and rhythm. Abdomen:  +BS, soft, non-tender and non-distended. No rebound or guarding. No HSM or masses noted. Msk:  Symmetrical without gross deformities. Normal posture. Extremities:  Without edema. Neurologic:  Alert and  oriented x4;  grossly normal neurologically. Skin:  Intact without significant lesions or rashes. Psych:  Alert and cooperative. Normal mood and affect.  Lab Results  Component Value Date   WBC 13.7* 08/27/2014   HGB 10.6* 08/27/2014   HCT 33.4* 08/27/2014   MCV 84.1 08/27/2014   PLT 304 08/27/2014

## 2015-05-30 NOTE — Op Note (Signed)
[facility] [address] [place], [address]   ENDOSCOPY PROCEDURE REPORT  PATIENT: Dawn, Hayes  MR#: [id number] BIRTHDATE: 03-10-1971 , 44  yrs. old GENDER: female ENDOSCOPIST: R.  Garfield Cornea, MD FACP [facility] REFERRED BY:  Emmie Niemann, PA PROCEDURE DATE:  06/21/15 PROCEDURE:  EGD, diagnostic INDICATIONS:  GERD; iron deficiency anemia; negative colonoscopy. MEDICATIONS: Versed 6 mg IV and Demerol 100 mg IV in divided doses. Zofran 4 mg IV.  Xylocaine gel orally. ASA CLASS:      Class II  CONSENT: The risks, benefits, limitations, alternatives and imponderables have been discussed.  The potential for biopsy, esophogeal dilation, etc. have also been reviewed.  Questions have been answered.  All parties agreeable.  Please see the history and physical in the medical record for more information.  DESCRIPTION OF PROCEDURE: After the risks benefits and alternatives of the procedure were thoroughly explained, informed consent was obtained.  The [id number] endoscope was introduced through the mouth and advanced to the second portion of the duodenum , limited by Without limitations. The instrument was slowly withdrawn as the mucosa was fully examined. Estimated blood loss is zero unless otherwise noted in this procedure report.    Normal-appearing tubular esophagus.  Stomach empty.  2 cm hiatal hernia.  Normal appearing gastric mucosa.  Patent pylorus. Normal-appearing first, second and third portion of the duodenum. Retroflexed views revealed as previously described.     The scope was then withdrawn from the patient and the procedure completed.  COMPLICATIONS: There were no immediate complications.  ENDOSCOPIC IMPRESSION: Hiatal hernia otherwise negative EGD  RECOMMENDATIONS: Continue omeprazole daily. See colonoscopy report. Would consider other non-GI causes of  iron deficiency anemia.  Office visit with [place] in 6 weeks  REPEAT  EXAM:  eSigned:  R. Garfield Cornea, MD Rosalita Chessman [facility] Jun 21, 2015 8:50 AM    CC:  CPT CODES: ICD CODES:  The ICD and CPT codes recommended by this software are interpretations from the data that the clinical staff has captured with the software.  The verification of the translation of this report to the ICD and CPT codes and modifiers is the sole responsibility of the health care institution and practicing physician where this report was generated.  [place]. will not be held responsible for the validity of the ICD and CPT codes included on this report.  AMA assumes no liability for data contained or not contained herein. CPT is a Designer, television/film set of the [employer].  PATIENT NAME:  Dawn, Hayes MR#: [id number]

## 2015-05-30 NOTE — Interval H&P Note (Signed)
History and Physical Interval Note:  05/30/2015 8:05 AM  Dawn Hayes  has presented today for surgery, with the diagnosis of rectal bleeding  The various methods of treatment have been discussed with the patient and family. After consideration of risks, benefits and other options for treatment, the patient has consented to  Procedure(s) with comments: COLONOSCOPY (N/A) - 0815 ESOPHAGOGASTRODUODENOSCOPY (EGD) (N/A) as a surgical intervention .  The patient's history has been reviewed, patient examined, no change in status, stable for surgery.  I have reviewed the patient's chart and labs.  Questions were answered to the patient's satisfaction.     Jaime Dome  No change. Colonoscopy and EGD per plan.  The risks, benefits, limitations, imponderables and alternatives regarding both EGD and colonoscopy have been reviewed with the patient. Questions have been answered. All parties agreeable.

## 2015-05-30 NOTE — Discharge Instructions (Addendum)
EGD Discharge instructions Please read the instructions outlined below and refer to this sheet in the next few weeks. These discharge instructions provide you with general information on caring for yourself after you leave the hospital. Your doctor may also give you specific instructions. While your treatment has been planned according to the most current medical practices available, unavoidable complications occasionally occur. If you have any problems or questions after discharge, please call your doctor. ACTIVITY  You may resume your regular activity but move at a slower pace for the next 24 hours.   Take frequent rest periods for the next 24 hours.   Walking will help expel (get rid of) the air and reduce the bloated feeling in your abdomen.   No driving for 24 hours (because of the anesthesia (medicine) used during the test).   You may shower.   Do not sign any important legal documents or operate any machinery for 24 hours (because of the anesthesia used during the test).  NUTRITION  Drink plenty of fluids.   You may resume your normal diet.   Begin with a light meal and progress to your normal diet.   Avoid alcoholic beverages for 24 hours or as instructed by your caregiver.  MEDICATIONS  You may resume your normal medications unless your caregiver tells you otherwise.  WHAT YOU CAN EXPECT TODAY  You may experience abdominal discomfort such as a feeling of fullness or gas pains.  FOLLOW-UP  Your doctor will discuss the results of your test with you.  SEEK IMMEDIATE MEDICAL ATTENTION IF ANY OF THE FOLLOWING OCCUR:  Excessive nausea (feeling sick to your stomach) and/or vomiting.   Severe abdominal pain and distention (swelling).   Trouble swallowing.   Temperature over 101 F (37.8 C).   Rectal bleeding or vomiting of blood.     Colonoscopy Discharge Instructions  Read the instructions outlined below and refer to this sheet in the next few weeks. These  discharge instructions provide you with general information on caring for yourself after you leave the hospital. Your doctor may also give you specific instructions. While your treatment has been planned according to the most current medical practices available, unavoidable complications occasionally occur. If you have any problems or questions after discharge, call Dr. Gala Romney at [phone]. ACTIVITY  You may resume your regular activity, but move at a slower pace for the next 24 hours.   Take frequent rest periods for the next 24 hours.   Walking will help get rid of the air and reduce the bloated feeling in your belly (abdomen).   No driving for 24 hours (because of the medicine (anesthesia) used during the test).    Do not sign any important legal documents or operate any machinery for 24 hours (because of the anesthesia used during the test).  NUTRITION  Drink plenty of fluids.   You may resume your normal diet as instructed by your doctor.   Begin with a light meal and progress to your normal diet. Heavy or fried foods are harder to digest and may make you feel sick to your stomach (nauseated).   Avoid alcoholic beverages for 24 hours or as instructed.  MEDICATIONS  You may resume your normal medications unless your doctor tells you otherwise.  WHAT YOU CAN EXPECT TODAY  Some feelings of bloating in the abdomen.   Passage of more gas than usual.   Spotting of blood in your stool or on the toilet paper.  IF YOU HAD POLYPS REMOVED DURING  THE COLONOSCOPY:  No aspirin products for 7 days or as instructed.   No alcohol for 7 days or as instructed.   Eat a soft diet for the next 24 hours.  FINDING OUT THE RESULTS OF YOUR TEST Not all test results are available during your visit. If your test results are not back during the visit, make an appointment with your caregiver to find out the results. Do not assume everything is normal if you have not heard from your caregiver or the  medical facility. It is important for you to follow up on all of your test results.  SEEK IMMEDIATE MEDICAL ATTENTION IF:  You have more than a spotting of blood in your stool.   Your belly is swollen (abdominal distention).   You are nauseated or vomiting.   You have a temperature over 101.   You have abdominal pain or discomfort that is severe or gets worse throughout the day.    GERD and hiatal hernia information provided  Hemorrhoid and diverticulosis information provided  Continue Nexium 40 mg daily  Begin Benefiber 1 tablespoon twice daily  Office visit with [place] in 6 weeks  Wednesday, March 1 @1 ;30pm  Gastroesophageal Reflux Disease, Adult Normally, food travels down the esophagus and stays in the stomach to be digested. However, when a person has gastroesophageal reflux disease (GERD), food and stomach acid move back up into the esophagus. When this happens, the esophagus becomes sore and inflamed. Over time, GERD can create small holes (ulcers) in the lining of the esophagus.  CAUSES This condition is caused by a problem with the muscle between the esophagus and the stomach (lower esophageal sphincter, or LES). Normally, the LES muscle closes after food passes through the esophagus to the stomach. When the LES is weakened or abnormal, it does not close properly, and that allows food and stomach acid to go back up into the esophagus. The LES can be weakened by certain dietary substances, medicines, and medical conditions, including:  Tobacco use.  Pregnancy.  Having a hiatal hernia.  Heavy alcohol use.  Certain foods and beverages, such as coffee, chocolate, onions, and peppermint. RISK FACTORS This condition is more likely to develop in:  People who have an increased body weight.  People who have connective tissue disorders.  People who use NSAID medicines. SYMPTOMS Symptoms of this condition include:  Heartburn.  Difficult or painful swallowing.  The  feeling of having a lump in the throat.  Abitter taste in the mouth.  Bad breath.  Having a large amount of saliva.  Having an upset or bloated stomach.  Belching.  Chest pain.  Shortness of breath or wheezing.  Ongoing (chronic) cough or a night-time cough.  Wearing away of tooth enamel.  Weight loss. Different conditions can cause chest pain. Make sure to see your health care provider if you experience chest pain. DIAGNOSIS Your health care provider will take a medical history and perform a physical exam. To determine if you have mild or severe GERD, your health care provider may also monitor how you respond to treatment. You may also have other tests, including:  An endoscopy toexamine your stomach and esophagus with a small camera.  A test thatmeasures the acidity level in your esophagus.  A test thatmeasures how much pressure is on your esophagus.  A barium swallow or modified barium swallow to show the shape, size, and functioning of your esophagus. TREATMENT The goal of treatment is to help relieve your symptoms and to prevent  complications. Treatment for this condition may vary depending on how severe your symptoms are. Your health care provider may recommend:  Changes to your diet.  Medicine.  Surgery. HOME CARE INSTRUCTIONS Diet  Follow a diet as recommended by your health care provider. This may involve avoiding foods and drinks such as:  Coffee and tea (with or without caffeine).  Drinks that containalcohol.  Energy drinks and sports drinks.  Carbonated drinks or sodas.  Chocolate and cocoa.  Peppermint and mint flavorings.  Garlic and onions.  Horseradish.  Spicy and acidic foods, including peppers, chili powder, curry powder, vinegar, hot sauces, and barbecue sauce.  Citrus fruit juices and citrus fruits, such as oranges, lemons, and limes.  Tomato-based foods, such as red sauce, chili, salsa, and pizza with red sauce.  Fried and  fatty foods, such as donuts, french fries, potato chips, and high-fat dressings.  High-fat meats, such as hot dogs and fatty cuts of red and white meats, such as rib eye steak, sausage, ham, and bacon.  High-fat dairy items, such as whole milk, butter, and cream cheese.  Eat small, frequent meals instead of large meals.  Avoid drinking large amounts of liquid with your meals.  Avoid eating meals during the 2-3 hours before bedtime.  Avoid lying down right after you eat.  Do not exercise right after you eat. General Instructions  Pay attention to any changes in your symptoms.  Take over-the-counter and prescription medicines only as told by your health care provider. Do not take aspirin, ibuprofen, or other NSAIDs unless your health care provider told you to do so.  Do not use any tobacco products, including cigarettes, chewing tobacco, and e-cigarettes. If you need help quitting, ask your health care provider.  Wear loose-fitting clothing. Do not wear anything tight around your waist that causes pressure on your abdomen.  Raise (elevate) the head of your bed 6 inches (15cm).  Try to reduce your stress, such as with yoga or meditation. If you need help reducing stress, ask your health care provider.  If you are overweight, reduce your weight to an amount that is healthy for you. Ask your health care provider for guidance about a safe weight loss goal.  Keep all follow-up visits as told by your health care provider. This is important. SEEK MEDICAL CARE IF:  You have new symptoms.  You have unexplained weight loss.  You have difficulty swallowing, or it hurts to swallow.  You have wheezing or a persistent cough.  Your symptoms do not improve with treatment.  You have a hoarse voice. SEEK IMMEDIATE MEDICAL CARE IF:  You have pain in your arms, neck, jaw, teeth, or back.  You feel sweaty, dizzy, or light-headed.  You have chest pain or shortness of breath.  You vomit  and your vomit looks like blood or coffee grounds.  You faint.  Your stool is bloody or black.  You cannot swallow, drink, or eat.   This information is not intended to replace advice given to you by your health care provider. Make sure you discuss any questions you have with your health care provider.   Hiatal Hernia A hiatal hernia occurs when part of your stomach slides above the muscle that separates your abdomen from your chest (diaphragm). You can be born with a hiatal hernia (congenital), or it may develop over time. In almost all cases of hiatal hernia, only the top part of the stomach pushes through.  Many people have a hiatal hernia with no  symptoms. The larger the hernia, the more likely that you will have symptoms. In some cases, a hiatal hernia allows stomach acid to flow back into the tube that carries food from your mouth to your stomach (esophagus). This may cause heartburn symptoms. Severe heartburn symptoms may mean you have developed a condition called gastroesophageal reflux disease (GERD).  CAUSES  Hiatal hernias are caused by a weakness in the opening (hiatus) where your esophagus passes through your diaphragm to attach to the upper part of your stomach. You may be born with a weakness in your hiatus, or a weakness can develop. RISK FACTORS Older age is a major risk factor for a hiatal hernia. Anything that increases pressure on your diaphragm can also increase your risk of a hiatal hernia. This includes:  Pregnancy.  Excess weight.  Frequent constipation. SIGNS AND SYMPTOMS  People with a hiatal hernia often have no symptoms. If symptoms develop, they are almost always caused by GERD. They may include:  Heartburn.  Belching.  Indigestion.  Trouble swallowing.  Coughing or wheezing.  Sore throat.  Hoarseness.  Chest pain. DIAGNOSIS  A hiatal hernia is sometimes found during an exam for another problem. Your health care provider may suspect a hiatal  hernia if you have symptoms of GERD. Tests may be done to diagnose GERD. These may include:  X-rays of your stomach or chest.  An upper gastrointestinal (GI) series. This is an X-ray exam of your GI tract involving the use of a chalky liquid that you swallow. The liquid shows up clearly on the X-ray.  Endoscopy. This is a procedure to look into your stomach using a thin, flexible tube that has a tiny camera and light on the end of it. TREATMENT  If you have no symptoms, you may not need treatment. If you have symptoms, treatment may include:  Dietary and lifestyle changes to help reduce GERD symptoms.  Medicines. These may include:  Over-the-counter antacids.  Medicines that make your stomach empty more quickly.  Medicines that block the production of stomach acid (H2 blockers).  Stronger medicines to reduce stomach acid (proton pump inhibitors).  You may need surgery to repair the hernia if other treatments are not helping. HOME CARE INSTRUCTIONS   Take all medicines as directed by your health care provider.  Quit smoking, if you smoke.  Try to achieve and maintain a healthy body weight.  Eat frequent small meals instead of three large meals a day. This keeps your stomach from getting too full.  Eat slowly.  Do not lie down right after eating.  Do noteat 1-2 hours before bed.   Do not drink beverages with caffeine. These include cola, coffee, cocoa, and tea.  Do not drink alcohol.  Avoid foods that can make symptoms of GERD worse. These may include:  Fatty foods.  Citrus fruits.  Other foods and drinks that contain acid.  Avoid putting pressure on your belly. Anything that puts pressure on your belly increases the amount of acid that may be pushed up into your esophagus.   Avoid bending over, especially after eating.  Raise the head of your bed by putting blocks under the legs. This keeps your head and esophagus higher than your stomach.  Do not wear  tight clothing around your chest or stomach.  Try not to strain when having a bowel movement, when urinating, or when lifting heavy objects. SEEK MEDICAL CARE IF:  Your symptoms are not controlled with medicines or lifestyle changes.  You are  having trouble swallowing.  You have coughing or wheezing that will not go away. SEEK IMMEDIATE MEDICAL CARE IF:  Your pain is getting worse.  Your pain spreads to your arms, neck, jaw, teeth, or back.  You have shortness of breath.  You sweat for no reason.  You feel sick to your stomach (nauseous) or vomit.  You vomit blood.  You have bright red blood in your stools.  You have black, tarry stools.    This information is not intended to replace advice given to you by your health care provider. Make sure you discuss any questions you have with your health care provider.   Diverticulosis Diverticulosis is the condition that develops when small pouches (diverticula) form in the wall of your colon. Your colon, or large intestine, is where water is absorbed and stool is formed. The pouches form when the inside layer of your colon pushes through weak spots in the outer layers of your colon. CAUSES  No one knows exactly what causes diverticulosis. RISK FACTORS  Being older than 47. Your risk for this condition increases with age. Diverticulosis is rare in people younger than 40 years. By age 59, almost everyone has it.  Eating a low-fiber diet.  Being frequently constipated.  Being overweight.  Not getting enough exercise.  Smoking.  Taking over-the-counter pain medicines, like aspirin and ibuprofen. SYMPTOMS  Most people with diverticulosis do not have symptoms. DIAGNOSIS  Because diverticulosis often has no symptoms, health care providers often discover the condition during an exam for other colon problems. In many cases, a health care provider will diagnose diverticulosis while using a flexible scope to examine the colon  (colonoscopy). TREATMENT  If you have never developed an infection related to diverticulosis, you may not need treatment. If you have had an infection before, treatment may include:  Eating more fruits, vegetables, and grains.  Taking a fiber supplement.  Taking a live bacteria supplement (probiotic).  Taking medicine to relax your colon. HOME CARE INSTRUCTIONS   Drink at least 6-8 glasses of water each day to prevent constipation.  Try not to strain when you have a bowel movement.  Keep all follow-up appointments. If you have had an infection before:  Increase the fiber in your diet as directed by your health care provider or dietitian.  Take a dietary fiber supplement if your health care provider approves.  Only take medicines as directed by your health care provider. SEEK MEDICAL CARE IF:   You have abdominal pain.  You have bloating.  You have cramps.  You have not gone to the bathroom in 3 days. SEEK IMMEDIATE MEDICAL CARE IF:   Your pain gets worse.  Yourbloating becomes very bad.  You have a fever or chills, and your symptoms suddenly get worse.  You begin vomiting.  You have bowel movements that are bloody or black. MAKE SURE YOU:  Understand these instructions.  Will watch your condition.  Will get help right away if you are not doing well or get worse.   This information is not intended to replace advice given to you by your health care provider. Make sure you discuss any questions you have with your health care provider.   Hemorrhoids Hemorrhoids are swollen veins around the rectum or anus. There are two types of hemorrhoids:   Internal hemorrhoids. These occur in the veins just inside the rectum. They may poke through to the outside and become irritated and painful.  External hemorrhoids. These occur in the veins outside  the anus and can be felt as a painful swelling or hard lump near the anus. CAUSES  Pregnancy.   Obesity.    Constipation or diarrhea.   Straining to have a bowel movement.   Sitting for long periods on the toilet.  Heavy lifting or other activity that caused you to strain.  Anal intercourse. SYMPTOMS   Pain.   Anal itching or irritation.   Rectal bleeding.   Fecal leakage.   Anal swelling.   One or more lumps around the anus.  DIAGNOSIS  Your caregiver may be able to diagnose hemorrhoids by visual examination. Other examinations or tests that may be performed include:   Examination of the rectal area with a gloved hand (digital rectal exam).   Examination of anal canal using a small tube (scope).   A blood test if you have lost a significant amount of blood.  A test to look inside the colon (sigmoidoscopy or colonoscopy). TREATMENT Most hemorrhoids can be treated at home. However, if symptoms do not seem to be getting better or if you have a lot of rectal bleeding, your caregiver may perform a procedure to help make the hemorrhoids get smaller or remove them completely. Possible treatments include:   Placing a rubber band at the base of the hemorrhoid to cut off the circulation (rubber band ligation).   Injecting a chemical to shrink the hemorrhoid (sclerotherapy).   Using a tool to burn the hemorrhoid (infrared light therapy).   Surgically removing the hemorrhoid (hemorrhoidectomy).   Stapling the hemorrhoid to block blood flow to the tissue (hemorrhoid stapling).  HOME CARE INSTRUCTIONS   Eat foods with fiber, such as whole grains, beans, nuts, fruits, and vegetables. Ask your doctor about taking products with added fiber in them (fibersupplements).  Increase fluid intake. Drink enough water and fluids to keep your urine clear or pale yellow.   Exercise regularly.   Go to the bathroom when you have the urge to have a bowel movement. Do not wait.   Avoid straining to have bowel movements.   Keep the anal area dry and clean. Use wet toilet  paper or moist towelettes after a bowel movement.   Medicated creams and suppositories may be used or applied as directed.   Only take over-the-counter or prescription medicines as directed by your caregiver.   Take warm sitz baths for 15-20 minutes, 3-4 times a day to ease pain and discomfort.   Place ice packs on the hemorrhoids if they are tender and swollen. Using ice packs between sitz baths may be helpful.   Put ice in a plastic bag.   Place a towel between your skin and the bag.   Leave the ice on for 15-20 minutes, 3-4 times a day.   Do not use a donut-shaped pillow or sit on the toilet for long periods. This increases blood pooling and pain.  SEEK MEDICAL CARE IF:  You have increasing pain and swelling that is not controlled by treatment or medicine.  You have uncontrolled bleeding.  You have difficulty or you are unable to have a bowel movement.  You have pain or inflammation outside the area of the hemorrhoids. MAKE SURE YOU:  Understand these instructions.  Will watch your condition.  Will get help right away if you are not doing well or get worse.   This information is not intended to replace advice given to you by your health care provider. Make sure you discuss any questions you have with your health  care provider.

## 2015-05-30 NOTE — Op Note (Signed)
[facility] [address] [place], [address]   COLONOSCOPY PROCEDURE REPORT  PATIENT: Dawn Hayes, Dawn Hayes  MR#: [id number] BIRTHDATE: 1971-03-09 , 52  yrs. old GENDER: female ENDOSCOPIST: R.  Garfield Cornea, MD [place] [id number] [place], [place] PROCEDURE DATE:  2015/06/20 PROCEDURE:   Colonoscopy, diagnostic INDICATIONS:hematochezia; iron deficiency anemia. MEDICATIONS: Versed 5 mg IV and Demerol 100 mg IV in divided doses. Zofran 4 mg IV. ASA CLASS:       Class II  CONSENT: The risks, benefits, alternatives and imponderables including but not limited to bleeding, perforation as well as the possibility of a missed lesion have been reviewed.  The potential for biopsy, lesion removal, etc. have also been discussed. Questions have been answered.  All parties agreeable.  Please see the history and physical in the medical record for more information.  DESCRIPTION OF PROCEDURE:   After the risks benefits and alternatives of the procedure were thoroughly explained, informed consent was obtained.  The digital rectal exam revealed no abnormalities of the rectum.   The [id number]  endoscope was introduced through the anus and advanced to the cecum, which was identified by both the appendix and ileocecal valve. No adverse events experienced.   The quality of the prep was adequate  The instrument was then slowly withdrawn as the colon was fully examined. Estimated blood loss is zero unless otherwise noted in this procedure report.      COLON FINDINGS: Minimal anal canal hemorrhoids; otherwise, normal-appearing rectal mucosa.  Scattered left-sided diverticula; the remainder of the colonic mucosa appeared normal.  Retroflexion was performed. .  Withdrawal time=6 minutes 0 seconds.  The scope was withdrawn and the procedure completed. COMPLICATIONS: There were no immediate complications.  ENDOSCOPIC IMPRESSION: Minimal anal canal hemorrhoids. Colonic  diverticulosis. I suspect relatively trivial recent GI bleed?"likely related to hemorrhoids. This finding alone would not likely adequately explain her degree of anemia. She likely has a significant component from menstrual losses, etc.  RECOMMENDATIONS: See EGD report.  eSigned:  R. Garfield Cornea, MD Rosalita Chessman [facility] June 20, 2015 8:35 AM   cc:  CPT CODES: ICD CODES:  The ICD and CPT codes recommended by this software are interpretations from the data that the clinical staff has captured with the software.  The verification of the translation of this report to the ICD and CPT codes and modifiers is the sole responsibility of the health care institution and practicing physician where this report was generated.  [employer]. will not be held responsible for the validity of the ICD and CPT codes included on this report.  AMA assumes no liability for data contained or not contained herein. CPT is a Designer, television/film set of the [employer].

## 2015-06-04 ENCOUNTER — Encounter (HOSPITAL_COMMUNITY): Payer: Self-pay | Admitting: Internal Medicine

## 2015-07-11 ENCOUNTER — Ambulatory Visit (INDEPENDENT_AMBULATORY_CARE_PROVIDER_SITE_OTHER): Payer: Commercial Managed Care - HMO | Admitting: Gastroenterology

## 2015-07-11 ENCOUNTER — Encounter: Payer: Self-pay | Admitting: Gastroenterology

## 2015-07-11 ENCOUNTER — Ambulatory Visit: Payer: Commercial Managed Care - HMO | Admitting: Gastroenterology

## 2015-07-11 VITALS — BP 126/86 | HR 95 | Temp 98.6°F | Ht 61.0 in | Wt 265.0 lb

## 2015-07-11 DIAGNOSIS — D500 Iron deficiency anemia secondary to blood loss (chronic): Secondary | ICD-10-CM | POA: Diagnosis not present

## 2015-07-11 NOTE — Patient Instructions (Signed)
Please have blood work done. We will likely set you up for an IV iron infusion.  Further recommendations after labs are reviewed!

## 2015-07-12 LAB — IRON AND TIBC
%SAT: 9 % — ABNORMAL LOW (ref 11–50)
IRON: 39 ug/dL — AB (ref 40–190)
TIBC: 451 ug/dL — ABNORMAL HIGH (ref 250–450)
UIBC: 412 ug/dL — ABNORMAL HIGH (ref 125–400)

## 2015-07-12 LAB — CBC
HCT: 35.4 % — ABNORMAL LOW (ref 36.0–46.0)
HEMOGLOBIN: 11 g/dL — AB (ref 12.0–15.0)
MCH: 25.1 pg — ABNORMAL LOW (ref 26.0–34.0)
MCHC: 31.1 g/dL (ref 30.0–36.0)
MCV: 80.6 fL (ref 78.0–100.0)
MPV: 9.7 fL (ref 8.6–12.4)
Platelets: 356 10*3/uL (ref 150–400)
RBC: 4.39 MIL/uL (ref 3.87–5.11)
RDW: 21.1 % — AB (ref 11.5–15.5)
WBC: 10.1 10*3/uL (ref 4.0–10.5)

## 2015-07-12 LAB — FERRITIN: FERRITIN: 13 ng/mL (ref 10–232)

## 2015-07-16 ENCOUNTER — Telehealth: Payer: Self-pay | Admitting: Internal Medicine

## 2015-07-16 NOTE — Progress Notes (Signed)
Quick Note:  Hgb, iron, and ferritin are all better! Still with IDA but much improved. Take iron BID for now, hold off on iron infusion. Recheck in 3 months (CBC, iron, ferritin)> ______

## 2015-07-16 NOTE — Telephone Encounter (Signed)
Routing to AS 

## 2015-07-16 NOTE — Telephone Encounter (Signed)
See lab results.  

## 2015-07-16 NOTE — Telephone Encounter (Signed)
Pt called to see if her lab results were available. Please call [phone]

## 2015-07-17 ENCOUNTER — Other Ambulatory Visit: Payer: Self-pay | Admitting: Gastroenterology

## 2015-07-17 DIAGNOSIS — D509 Iron deficiency anemia, unspecified: Secondary | ICD-10-CM

## 2015-07-18 ENCOUNTER — Encounter: Payer: Self-pay | Admitting: Gastroenterology

## 2015-07-18 NOTE — Assessment & Plan Note (Signed)
IDA likely secondary to menstrual losses. No capsule indicated currently unless worsening of IDA despite supplemental iron. Will recheck labs today. TCS/EGD unrevealing. As of note, history of likely IBS-D but declining any agents currently. Further recommendations after blood work completed.

## 2015-07-18 NOTE — Progress Notes (Signed)
Referring Provider: Aletha Halim., PA-C Primary Care Physician:  Tula Nakayama  Primary GI: Dr. Gala Romney  Chief Complaint  Patient presents with  . Follow-up    HPI:   Dawn Hayes is a 45 y.o. female presenting today with a history of IDA, returning in follow-up after EGD/TCS. These were essentially unrevealing. IDA likely secondary to non-GI origin. Did not take Bentyl. Has chronic abdominal pain and diarrhea most consistent with IBS. States her periods are not as heavy as before, and are heavy "only on the first day". She is declining any agent such as Viberzi to trial for IBS.   Past Medical History  Diagnosis Date  . Diverticulitis   . GERD (gastroesophageal reflux disease)   . Psoriatic arthritis ([place])   . Dysmenorrhea   . Menorrhagia   . Smoker   . Hypertension   . Costochondritis     Past Surgical History  Procedure Laterality Date  . Esophagogastroduodenoscopy  09/11/99    tiny esophageal erosions with mild erosive reflux/no barrett's/normal stomach  . Colonoscopy  1998    normal, [place]  . Cholecystectomy, laparoscopic  2006  . Carpal tunnel release Left 01/19/2015    Procedure: LEFT CARPAL TUNNEL RELEASE;  Surgeon: Roseanne Kaufman, MD;  Location: [facility];  Service: Orthopedics;  Laterality: Left;  . Cholecystectomy    . Colonoscopy N/A 05/30/2015    RMR: Minimal anal canal hemorrhoids. colonic diverticulosis. I suspect relatively trivial recent gI Bleed likely related to hemorrhoids. This finding alone would not likely adequatly explain her degree of anemia. she likely has a significent component from menstrual losses. , etc.   . Esophagogastroduodenoscopy N/A 05/30/2015    RMR: Hiatal hernia otherwise negative EGD    Current Outpatient Prescriptions  Medication Sig Dispense Refill  . acetaminophen (TYLENOL) 325 MG tablet Take 2 tablets (650 mg total) by mouth every 6 (six) hours as needed for mild pain or headache.    .  celecoxib (CELEBREX) 50 MG capsule Take 50 mg by mouth daily.    Marland Kitchen esomeprazole (NEXIUM) 40 MG capsule Take 1 capsule (40 mg total) by mouth daily before breakfast. 30 capsule 11  . HUMIRA PEN 40 MG/0.8ML PNKT     . hydrochlorothiazide (HYDRODIURIL) 25 MG tablet Take 25 tablets by mouth daily.     Marland Kitchen ibuprofen (ADVIL,MOTRIN) 200 MG tablet Take 600 mg by mouth every 6 (six) hours as needed for moderate pain.    Marland Kitchen lisinopril (PRINIVIL,ZESTRIL) 10 MG tablet Take 10 mg by mouth daily.    . naproxen (NAPROSYN) 500 MG tablet Take 1 tablet (500 mg total) by mouth 2 (two) times daily with a meal. (Patient taking differently: Take 500 mg by mouth daily. ) 30 tablet 0  . ondansetron (ZOFRAN-ODT) 4 MG disintegrating tablet Take 4 mg by mouth 3 (three) times daily as needed. Reported on 07/11/2015  0   No current facility-administered medications for this visit.    Allergies as of 07/11/2015 - Review Complete 07/11/2015  Allergen Reaction Noted  . Adhesive [tape] Other (See Comments) 09/26/2013  . Aspirin Nausea Only 12/06/2009    Family History  Problem Relation Age of Onset  . Colon cancer Maternal Grandmother 32  . Brain cancer Father   . Prostate cancer Father   . Hyperlipidemia Mother   . Stroke Paternal Grandfather     COD    Social History   Social History  . Marital Status: Married    Spouse Name: N/A  .  Number of Children: 0  . Years of Education: N/A   Occupational History  . [employer]    Social History Main Topics  . Smoking status: Current Every Day Smoker -- 0.50 packs/day for 20 years    Types: Cigarettes  . Smokeless tobacco: Never Used     Comment: smoking 3 cigarettes a day, hasn't quit completely   . Alcohol Use: 9.6 oz/week    6 Glasses of wine, 10 Standard drinks or equivalent per week     Comment: socially, weekends 4-5  . Drug Use: No  . Sexual Activity:    Partners: Male    Birth Control/ Protection: None   Other Topics Concern  . None    Social History Narrative    Review of Systems: Negative unless mentioned in HPI   Physical Exam: BP 126/86 mmHg  Pulse 95  Temp(Src) 98.6 F (37 C)  Ht 5\' 1"  (1.549 m)  Wt 265 lb (120.203 kg)  BMI 50.10 kg/m2  LMP 06/29/2015 (Approximate) General:   Alert and oriented. No distress noted. Pleasant and cooperative.  Head:  Normocephalic and atraumatic. Eyes:  Conjuctiva clear without scleral icterus. Mouth:  Oral mucosa pink and moist. Good dentition. No lesions. Abdomen:  +BS, soft, non-tender and non-distended. No rebound or guarding. No HSM or masses noted. Msk:  Symmetrical without gross deformities. Normal posture. Extremities:  Without edema. Neurologic:  Alert and  oriented x4;  grossly normal neurologically. Psych:  Alert and cooperative. Normal mood and affect.

## 2015-07-23 NOTE — Progress Notes (Signed)
cc'ed to pcp °

## 2015-09-07 ENCOUNTER — Encounter: Payer: Self-pay | Admitting: Family Medicine

## 2015-09-07 ENCOUNTER — Ambulatory Visit (INDEPENDENT_AMBULATORY_CARE_PROVIDER_SITE_OTHER): Payer: Commercial Managed Care - HMO | Admitting: Family Medicine

## 2015-09-07 VITALS — BP 108/78 | HR 82 | Temp 98.0°F | Resp 16 | Ht 61.0 in | Wt 259.1 lb

## 2015-09-07 DIAGNOSIS — J302 Other seasonal allergic rhinitis: Secondary | ICD-10-CM | POA: Diagnosis not present

## 2015-09-07 DIAGNOSIS — J309 Allergic rhinitis, unspecified: Secondary | ICD-10-CM | POA: Insufficient documentation

## 2015-09-07 NOTE — Patient Instructions (Signed)
Follow up as needed Finish the Prednisone as directed Drink plenty of fluids Use the albuterol inhaler as needed Start daily Claritin or Zyrtec- store brand generic is just as effective Call with any questions or concerns Welcome!  We're glad to have you! Hang in there!!

## 2015-09-07 NOTE — Assessment & Plan Note (Signed)
New.  Ongoing for pt.  No evidence of current infxn- no need for additional abxs.  Encouraged her to continue Prednisone as directed.  Add daily allergy medication to decrease PND.  Reviewed supportive care and red flags that should prompt return.  Pt expressed understanding and is in agreement w/ plan.

## 2015-09-07 NOTE — Progress Notes (Signed)
   Subjective:    Patient ID: Dawn Hayes, female    DOB: Sep 09, 1970, 45 y.o.   MRN: [id number]  HPI New to establish.  Previous MD- Bing Matter  URI- pt was seen last month at [facility] w/ 4 weeks of cough that flared her costochondritis.  Rheumatologist at [facility] [place] called in Prednisone on4/25 to improve her sxs.  Current sxs started Sunday w/ sore throat and raspy voice.  Cough 'immediately came back'.  Has responded well to Levaquin in the past due to Psoriatic arthritis.  Last month took Doxy and cough syrup w/ codeine which made her ill.  Has only used albuterol inhaler twice this week.  No fevers.  Cough is intermittently productive.  Denies sinus pain/pressure.  R ear pressure.  + sick contacts.   Review of Systems For ROS see HPI     Objective:   Physical Exam  Constitutional: She is oriented to person, place, and time. She appears well-developed and well-nourished. No distress.  HENT:  Head: Normocephalic and atraumatic.  Right Ear: Tympanic membrane normal.  Left Ear: Tympanic membrane normal.  Nose: Mucosal edema and rhinorrhea present. Right sinus exhibits no maxillary sinus tenderness and no frontal sinus tenderness. Left sinus exhibits no maxillary sinus tenderness and no frontal sinus tenderness.  Mouth/Throat: Mucous membranes are normal. Posterior oropharyngeal erythema (w/ PND) present.  Eyes: Conjunctivae and EOM are normal. Pupils are equal, round, and reactive to light.  Neck: Normal range of motion. Neck supple.  Cardiovascular: Normal rate, regular rhythm and normal heart sounds.   Pulmonary/Chest: Effort normal and breath sounds normal. No respiratory distress. She has no wheezes. She has no rales.  Lymphadenopathy:    She has no cervical adenopathy.  Neurological: She is alert and oriented to person, place, and time.  Skin: Skin is warm and dry.  Psychiatric: She has a normal mood and affect. Her behavior is normal. Thought content normal.    Vitals reviewed.         Assessment & Plan:

## 2015-09-18 ENCOUNTER — Encounter (HOSPITAL_COMMUNITY): Payer: Self-pay

## 2015-09-18 ENCOUNTER — Emergency Department (HOSPITAL_COMMUNITY): Payer: Commercial Managed Care - HMO

## 2015-09-18 DIAGNOSIS — I10 Essential (primary) hypertension: Secondary | ICD-10-CM | POA: Diagnosis not present

## 2015-09-18 DIAGNOSIS — Z79899 Other long term (current) drug therapy: Secondary | ICD-10-CM | POA: Diagnosis not present

## 2015-09-18 DIAGNOSIS — Z8739 Personal history of other diseases of the musculoskeletal system and connective tissue: Secondary | ICD-10-CM | POA: Insufficient documentation

## 2015-09-18 DIAGNOSIS — K219 Gastro-esophageal reflux disease without esophagitis: Secondary | ICD-10-CM | POA: Diagnosis not present

## 2015-09-18 DIAGNOSIS — Z8742 Personal history of other diseases of the female genital tract: Secondary | ICD-10-CM | POA: Insufficient documentation

## 2015-09-18 DIAGNOSIS — R0789 Other chest pain: Secondary | ICD-10-CM | POA: Insufficient documentation

## 2015-09-18 DIAGNOSIS — R0602 Shortness of breath: Secondary | ICD-10-CM | POA: Insufficient documentation

## 2015-09-18 DIAGNOSIS — Z872 Personal history of diseases of the skin and subcutaneous tissue: Secondary | ICD-10-CM | POA: Insufficient documentation

## 2015-09-18 DIAGNOSIS — R000 Tachycardia, unspecified: Secondary | ICD-10-CM | POA: Diagnosis not present

## 2015-09-18 DIAGNOSIS — F17210 Nicotine dependence, cigarettes, uncomplicated: Secondary | ICD-10-CM | POA: Diagnosis not present

## 2015-09-18 DIAGNOSIS — R079 Chest pain, unspecified: Secondary | ICD-10-CM | POA: Diagnosis present

## 2015-09-18 LAB — CBC
HCT: 36.3 % (ref 36.0–46.0)
Hemoglobin: 11.3 g/dL — ABNORMAL LOW (ref 12.0–15.0)
MCH: 26.2 pg (ref 26.0–34.0)
MCHC: 31.1 g/dL (ref 30.0–36.0)
MCV: 84 fL (ref 78.0–100.0)
PLATELETS: 333 10*3/uL (ref 150–400)
RBC: 4.32 MIL/uL (ref 3.87–5.11)
RDW: 18 % — ABNORMAL HIGH (ref 11.5–15.5)
WBC: 9.9 10*3/uL (ref 4.0–10.5)

## 2015-09-18 LAB — I-STAT TROPONIN, ED: TROPONIN I, POC: 0.01 ng/mL (ref 0.00–0.08)

## 2015-09-18 NOTE — ED Notes (Signed)
Pt here with c/o substernal chest pain and shortness of breath that started tonight around 9pm when she was washing dishes. Pt appears very anxious at triage and is tearful. Denies any other symptoms.

## 2015-09-19 ENCOUNTER — Encounter: Payer: Self-pay | Admitting: General Practice

## 2015-09-19 ENCOUNTER — Emergency Department (HOSPITAL_COMMUNITY)
Admission: EM | Admit: 2015-09-19 | Discharge: 2015-09-19 | Disposition: A | Discharge status: Home / Self Care | Payer: Commercial Managed Care - HMO | Attending: Emergency Medicine | Admitting: Emergency Medicine

## 2015-09-19 DIAGNOSIS — R079 Chest pain, unspecified: Secondary | ICD-10-CM

## 2015-09-19 LAB — BASIC METABOLIC PANEL
Anion gap: 11 (ref 5–15)
BUN: 8 mg/dL (ref 6–20)
CHLORIDE: 105 mmol/L (ref 101–111)
CO2: 23 mmol/L (ref 22–32)
CREATININE: 0.56 mg/dL (ref 0.44–1.00)
Calcium: 9.5 mg/dL (ref 8.9–10.3)
GFR calc non Af Amer: >60 mL/min (ref >60)
Glucose, Bld: 105 mg/dL — ABNORMAL HIGH (ref 65–99)
Potassium: 3.7 mmol/L (ref 3.5–5.1)
SODIUM: 139 mmol/L (ref 135–145)

## 2015-09-19 LAB — I-STAT TROPONIN, ED: Troponin i, poc: 0 ng/mL (ref 0.00–0.08)

## 2015-09-19 MED ORDER — KETOROLAC TROMETHAMINE 30 MG/ML IJ SOLN
30.0000 mg | Freq: Once | INTRAMUSCULAR | Status: DC
  Filled 2015-09-19: qty 1

## 2015-09-19 MED ORDER — DEXAMETHASONE SODIUM PHOSPHATE 10 MG/ML IJ SOLN
10.0000 mg | Freq: Once | INTRAMUSCULAR | Status: AC
  Administered 2015-09-19: 10 mg via INTRAVENOUS
  Filled 2015-09-19: qty 1

## 2015-09-19 MED ORDER — MORPHINE SULFATE (PF) 4 MG/ML IV SOLN
4.0000 mg | Freq: Once | INTRAVENOUS | Status: AC
  Administered 2015-09-19: 4 mg via INTRAVENOUS
  Filled 2015-09-19: qty 1

## 2015-09-19 NOTE — ED Provider Notes (Signed)
CSN: [id number]     Arrival date & time 09/18/15  2323 History   First MD Initiated Contact with Patient 09/19/15 0151     Chief Complaint  Patient presents with  . Chest Pain  . Shortness of Breath    HPI    Dawn Hayes is a 45 y.o. female with a PMH of GERD, HTN, tobacco use who presents to the ED with chest pain and shortness of breath. She states her symptoms started today prior to arrival and have been constant since that time. She reports lying flat exacerbates her pain. She has not tried anything for symptom relief. She states she has a history of costochondritis and notes her symptoms feel consistent with this. She denies fever, chills, abdominal pain, nausea, vomiting. She denies recent travel or immobility, recent surgery, history of DVT or PE, history of malignancy. She states she typically has flares of costochondritis monthly and takes enbrel injections for this. She states she missed her injection last week.  Past Medical History  Diagnosis Date  . Diverticulitis   . GERD (gastroesophageal reflux disease)   . Psoriatic arthritis ([place])   . Dysmenorrhea   . Menorrhagia   . Smoker   . Hypertension   . Costochondritis    Past Surgical History  Procedure Laterality Date  . Esophagogastroduodenoscopy  09/11/99    tiny esophageal erosions with mild erosive reflux/no barrett's/normal stomach  . Colonoscopy  1998    normal, [place]  . Cholecystectomy, laparoscopic  2006  . Carpal tunnel release Left 01/19/2015    Procedure: LEFT CARPAL TUNNEL RELEASE;  Surgeon: Roseanne Kaufman, MD;  Location: [facility];  Service: Orthopedics;  Laterality: Left;  . Cholecystectomy    . Colonoscopy N/A 05/30/2015    RMR: Minimal anal canal hemorrhoids. colonic diverticulosis. I suspect relatively trivial recent gI Bleed likely related to hemorrhoids. This finding alone would not likely adequatly explain her degree of anemia. she likely has a significent component from menstrual  losses. , etc.   . Esophagogastroduodenoscopy N/A 05/30/2015    RMR: Hiatal hernia otherwise negative EGD   Family History  Problem Relation Age of Onset  . Colon cancer Maternal Grandmother 63  . Brain cancer Father   . Prostate cancer Father   . Hyperlipidemia Mother   . Stroke Paternal Grandfather     COD   Social History  Substance Use Topics  . Smoking status: Current Every Day Smoker -- 0.50 packs/day for 20 years    Types: Cigarettes  . Smokeless tobacco: Never Used     Comment: smoking 3 cigarettes a day, hasn't quit completely   . Alcohol Use: 9.6 oz/week    6 Glasses of wine, 10 Standard drinks or equivalent per week     Comment: socially, weekends 4-5   OB History    Gravida Para Term Preterm AB TAB SAB Ectopic Multiple Living   0               Review of Systems  Constitutional: Negative for fever and chills.  Respiratory: Positive for shortness of breath.   Cardiovascular: Positive for chest pain.  Gastrointestinal: Negative for nausea, vomiting and abdominal pain.  All other systems reviewed and are negative.     Allergies  Adhesive and Aspirin  Home Medications   Prior to Admission medications   Medication Sig Start Date End Date Taking? Authorizing Provider  ENBREL SURECLICK 50 MG/ML injection Inject 50 mg as directed once a week. Wednesday 08/29/15  Yes Historical Provider, MD  esomeprazole (NEXIUM) 40 MG capsule Take 1 capsule (40 mg total) by mouth daily before breakfast. 02/27/15  Yes Mahala Menghini, PA-C  hydrochlorothiazide (HYDRODIURIL) 25 MG tablet Take 25 tablets by mouth daily.  12/14/12  Yes Historical Provider, MD  lisinopril (PRINIVIL,ZESTRIL) 10 MG tablet Take 10 mg by mouth daily.   Yes Historical Provider, MD  naproxen (NAPROSYN) 500 MG tablet Take 1 tablet (500 mg total) by mouth 2 (two) times daily with a meal. Patient not taking: Reported on 09/19/2015 08/27/14   Larene Pickett, PA-C    BP 135/82 mmHg  Pulse 98  Temp(Src) 98.4 F  (36.9 C) (Oral)  Resp 20  SpO2 99%  LMP 08/28/2015 Physical Exam  Constitutional: She is oriented to person, place, and time. She appears well-developed and well-nourished. No distress.  HENT:  Head: Normocephalic and atraumatic.  Right Ear: External ear normal.  Left Ear: External ear normal.  Nose: Nose normal.  Mouth/Throat: Uvula is midline, oropharynx is clear and moist and mucous membranes are normal.  Eyes: Conjunctivae, EOM and lids are normal. Pupils are equal, round, and reactive to light. Right eye exhibits no discharge. Left eye exhibits no discharge. No scleral icterus.  Neck: Normal range of motion. Neck supple.  Cardiovascular: Normal rate, regular rhythm, normal heart sounds, intact distal pulses and normal pulses.   Pulmonary/Chest: Effort normal and breath sounds normal. No respiratory distress. She has no wheezes. She has no rales. She exhibits tenderness.  Central anterior chest wall over sternum tender to palpation. Patient states this reproduces her pain.  Abdominal: Soft. Normal appearance and bowel sounds are normal. She exhibits no distension and no mass. There is no tenderness. There is no rigidity, no rebound and no guarding.  Musculoskeletal: Normal range of motion. She exhibits no edema or tenderness.  Neurological: She is alert and oriented to person, place, and time. She has normal strength. No cranial nerve deficit or sensory deficit.  Skin: Skin is warm, dry and intact. No rash noted. She is not diaphoretic. No erythema. No pallor.  Psychiatric: She has a normal mood and affect. Her speech is normal and behavior is normal.  Nursing note and vitals reviewed.   ED Course  Procedures (including critical care time)  Labs Review Labs Reviewed  BASIC METABOLIC PANEL - Abnormal; Notable for the following:    Glucose, Bld 105 (*)    All other components within normal limits  CBC - Abnormal; Notable for the following:    Hemoglobin 11.3 (*)    RDW 18.0 (*)     All other components within normal limits  I-STAT TROPOININ, ED  Randolm Idol, ED    Imaging Review Dg Chest 2 View  09/18/2015  CLINICAL DATA:  45 year old female pain with shortness of breath and chest pain EXAM: CHEST  2 VIEW COMPARISON:  Chest radiograph dated 12/14/2014 FINDINGS: The heart size and mediastinal contours are within normal limits. Both lungs are clear. The visualized skeletal structures are unremarkable. IMPRESSION: No active cardiopulmonary disease. Electronically Signed   By: Anner Crete M.D.   On: 09/18/2015 23:40   I have personally reviewed and evaluated these images and lab results as part of my medical decision-making.   EKG Interpretation   Date/Time:  Tuesday Sep 18 2015 23:29:26 EDT Ventricular Rate:  118 PR Interval:  140 QRS Duration: 76 QT Interval:  306 QTC Calculation: 428 R Axis:   54 Text Interpretation:  Sinus tachycardia Low voltage QRS Cannot  rule out  Anterior infarct , age undetermined Abnormal ECG No significant change  since last tracing Confirmed by Glynn Octave [phone]) on 09/19/2015  1:38:28 AM      MDM   Final diagnoses:  Chest pain, unspecified chest pain type    45 year old female presents with chest pain and shortness of breath, which she notes is characteristic of her typical costochondritis flare. Denies fever, chills, abdominal pain, nausea, vomiting, recent travel or immobility, recent surgery, history of DVT or PE, history of malignancy.  Patient is afebrile. Initially, HR 114. Heart regular rhythm. Lungs clear to auscultation bilaterally. Anterior chest wall TTP over sternum, patient states this reproduces her pain. Abdomen soft, non-tender, non-distended. No lower extremity edema.  EKG sinus tachycardia, HR 118. Troponin negative x 2. CBC negative for leukocytosis, hemoglobin 11.3. BMP unremarkable. CXR negative for active cardiopulmonary disease. Patient given steroid and pain medication.  On  reassessment of patient, she reports significant symptom improvement and states she is ready to go home. Patient is non-toxic and well-appearing, feel she is stable for discharge at this time. Symptoms likely consistent with costochondritis. No significant risk factors for PE, tachycardia improved once pain controlled. Low suspicion for ACS given unremarkable work-up in the ED. Patient to follow-up with PCP. Strict return precautions discussed. Patient verbalizes her understanding and is in agreement with plan.  BP 135/82 mmHg  Pulse 98  Temp(Src) 98.4 F (36.9 C) (Oral)  Resp 20  SpO2 99%  LMP 08/28/2015     Marella Chimes, PA-C 09/19/15 [id number]  Everlene Balls, MD 09/19/15 [phone]

## 2015-09-19 NOTE — Discharge Instructions (Signed)
1. Medications: usual home medications 2. Treatment: rest, drink plenty of fluids 3. Follow Up: please followup with your primary doctor for discussion of your diagnoses and further evaluation after today's visit; if you do not have a primary care doctor use the phone number listed in your discharge paperwork to find one; please return to the ER for severe chest pain, increased shortness of breath, new or worsening symptoms   Nonspecific Chest Pain  Chest pain can be caused by many different conditions. There is always a chance that your pain could be related to something serious, such as a heart attack or a blood clot in your lungs. Chest pain can also be caused by conditions that are not life-threatening. If you have chest pain, it is very important to follow up with your health care provider. CAUSES  Chest pain can be caused by:  Heartburn.  Pneumonia or bronchitis.  Anxiety or stress.  Inflammation around your heart (pericarditis) or lung (pleuritis or pleurisy).  A blood clot in your lung.  A collapsed lung (pneumothorax). It can develop suddenly on its own (spontaneous pneumothorax) or from trauma to the chest.  Shingles infection (varicella-zoster virus).  Heart attack.  Damage to the bones, muscles, and cartilage that make up your chest wall. This can include:  Bruised bones due to injury.  Strained muscles or cartilage due to frequent or repeated coughing or overwork.  Fracture to one or more ribs.  Sore cartilage due to inflammation (costochondritis). RISK FACTORS  Risk factors for chest pain may include:  Activities that increase your risk for trauma or injury to your chest.  Respiratory infections or conditions that cause frequent coughing.  Medical conditions or overeating that can cause heartburn.  Heart disease or family history of heart disease.  Conditions or health behaviors that increase your risk of developing a blood clot.  Having had chicken pox  (varicella zoster). SIGNS AND SYMPTOMS Chest pain can feel like:  Burning or tingling on the surface of your chest or deep in your chest.  Crushing, pressure, aching, or squeezing pain.  Dull or sharp pain that is worse when you move, cough, or take a deep breath.  Pain that is also felt in your back, neck, shoulder, or arm, or pain that spreads to any of these areas. Your chest pain may come and go, or it may stay constant. DIAGNOSIS Lab tests or other studies may be needed to find the cause of your pain. Your health care provider may have you take a test called an ambulatory ECG (electrocardiogram). An ECG records your heartbeat patterns at the time the test is performed. You may also have other tests, such as:  Transthoracic echocardiogram (TTE). During echocardiography, sound waves are used to create a picture of all of the heart structures and to look at how blood flows through your heart.  Transesophageal echocardiogram (TEE).This is a more advanced imaging test that obtains images from inside your body. It allows your health care provider to see your heart in finer detail.  Cardiac monitoring. This allows your health care provider to monitor your heart rate and rhythm in real time.  Holter monitor. This is a portable device that records your heartbeat and can help to diagnose abnormal heartbeats. It allows your health care provider to track your heart activity for several days, if needed.  Stress tests. These can be done through exercise or by taking medicine that makes your heart beat more quickly.  Blood tests.  Imaging tests. TREATMENT  Your treatment depends on what is causing your chest pain. Treatment may include:  Medicines. These may include:  Acid blockers for heartburn.  Anti-inflammatory medicine.  Pain medicine for inflammatory conditions.  Antibiotic medicine, if an infection is present.  Medicines to dissolve blood clots.  Medicines to treat coronary  artery disease.  Supportive care for conditions that do not require medicines. This may include:  Resting.  Applying heat or cold packs to injured areas.  Limiting activities until pain decreases. HOME CARE INSTRUCTIONS  If you were prescribed an antibiotic medicine, finish it all even if you start to feel better.  Avoid any activities that bring on chest pain.  Do not use any tobacco products, including cigarettes, chewing tobacco, or electronic cigarettes. If you need help quitting, ask your health care provider.  Do not drink alcohol.  Take medicines only as directed by your health care provider.  Keep all follow-up visits as directed by your health care provider. This is important. This includes any further testing if your chest pain does not go away.  If heartburn is the cause for your chest pain, you may be told to keep your head raised (elevated) while sleeping. This reduces the chance that acid will go from your stomach into your esophagus.  Make lifestyle changes as directed by your health care provider. These may include:  Getting regular exercise. Ask your health care provider to suggest some activities that are safe for you.  Eating a heart-healthy diet. A registered dietitian can help you to learn healthy eating options.  Maintaining a healthy weight.  Managing diabetes, if necessary.  Reducing stress. SEEK MEDICAL CARE IF:  Your chest pain does not go away after treatment.  You have a rash with blisters on your chest.  You have a fever. SEEK IMMEDIATE MEDICAL CARE IF:   Your chest pain is worse.  You have an increasing cough, or you cough up blood.  You have severe abdominal pain.  You have severe weakness.  You faint.  You have chills.  You have sudden, unexplained chest discomfort.  You have sudden, unexplained discomfort in your arms, back, neck, or jaw.  You have shortness of breath at any time.  You suddenly start to sweat, or your  skin gets clammy.  You feel nauseous or you vomit.  You suddenly feel light-headed or dizzy.  Your heart begins to beat quickly, or it feels like it is skipping beats. These symptoms may represent a serious problem that is an emergency. Do not wait to see if the symptoms will go away. Get medical help right away. Call your local emergency services (911 in the [place] Do not drive yourself to the hospital.   This information is not intended to replace advice given to you by your health care provider. Make sure you discuss any questions you have with your health care provider.   Document Released: 02/05/2005 Document Revised: 05/19/2014 Document Reviewed: 12/02/2013 Elsevier Interactive Patient Education [employer].

## 2015-10-01 ENCOUNTER — Other Ambulatory Visit: Payer: Self-pay

## 2015-10-01 DIAGNOSIS — D509 Iron deficiency anemia, unspecified: Secondary | ICD-10-CM

## 2015-10-02 ENCOUNTER — Telehealth: Payer: Self-pay | Admitting: Internal Medicine

## 2015-10-02 NOTE — Telephone Encounter (Signed)
Labs are the monthly lab reminders that Dawn Hayes is doing. She just did the letter today and it will be mailed out.  Pt is not due for labs until June.

## 2015-10-02 NOTE — Telephone Encounter (Signed)
Please mail a copy of lab orders to the patient. She said that her letter said they were enclosed, but they were not in with the letter. I verified her address and told her we would mail her a copy.

## 2015-10-10 ENCOUNTER — Encounter: Payer: Self-pay | Admitting: Family Medicine

## 2015-10-10 ENCOUNTER — Ambulatory Visit (INDEPENDENT_AMBULATORY_CARE_PROVIDER_SITE_OTHER): Payer: Commercial Managed Care - HMO | Admitting: Family Medicine

## 2015-10-10 VITALS — BP 132/85 | HR 88 | Temp 98.6°F | Resp 16 | Ht 61.0 in | Wt 264.0 lb

## 2015-10-10 DIAGNOSIS — D509 Iron deficiency anemia, unspecified: Secondary | ICD-10-CM | POA: Insufficient documentation

## 2015-10-10 DIAGNOSIS — W57XXXA Bitten or stung by nonvenomous insect and other nonvenomous arthropods, initial encounter: Secondary | ICD-10-CM | POA: Diagnosis not present

## 2015-10-10 DIAGNOSIS — R5383 Other fatigue: Secondary | ICD-10-CM

## 2015-10-10 DIAGNOSIS — T148 Other injury of unspecified body region: Secondary | ICD-10-CM

## 2015-10-10 LAB — CBC WITH DIFFERENTIAL/PLATELET
BASOS ABS: 0.1 10*3/uL (ref 0.0–0.1)
Basophils Relative: 0.7 % (ref 0.0–3.0)
Eosinophils Absolute: 0.1 10*3/uL (ref 0.0–0.7)
Eosinophils Relative: 1.1 % (ref 0.0–5.0)
HCT: 34.3 % — ABNORMAL LOW (ref 36.0–46.0)
Hemoglobin: 11 g/dL — ABNORMAL LOW (ref 12.0–15.0)
LYMPHS ABS: 2.4 10*3/uL (ref 0.7–4.0)
LYMPHS PCT: 25.8 % (ref 12.0–46.0)
MCHC: 32.1 g/dL (ref 30.0–36.0)
MCV: 82.3 fl (ref 78.0–100.0)
MONOS PCT: 5.8 % (ref 3.0–12.0)
Monocytes Absolute: 0.5 10*3/uL (ref 0.1–1.0)
NEUTROS PCT: 66.6 % (ref 43.0–77.0)
Neutro Abs: 6.1 10*3/uL (ref 1.4–7.7)
Platelets: 322 10*3/uL (ref 150.0–400.0)
RBC: 4.16 Mil/uL (ref 3.87–5.11)
RDW: 17.9 % — ABNORMAL HIGH (ref 11.5–15.5)
WBC: 9.2 10*3/uL (ref 4.0–10.5)

## 2015-10-10 LAB — TSH: TSH: 1.88 u[IU]/mL (ref 0.35–4.50)

## 2015-10-10 LAB — BASIC METABOLIC PANEL
BUN: 13 mg/dL (ref 6–23)
CHLORIDE: 101 meq/L (ref 96–112)
CO2: 23 meq/L (ref 19–32)
CREATININE: 0.65 mg/dL (ref 0.40–1.20)
Calcium: 9.4 mg/dL (ref 8.4–10.5)
GFR: 104.77 mL/min (ref >60.00)
Glucose, Bld: 89 mg/dL (ref 70–99)
Potassium: 4.1 mEq/L (ref 3.5–5.1)
Sodium: 134 mEq/L — ABNORMAL LOW (ref 135–145)

## 2015-10-10 LAB — HEPATIC FUNCTION PANEL
ALT: 41 U/L — AB (ref 0–35)
AST: 35 U/L (ref 0–37)
Albumin: 4 g/dL (ref 3.5–5.2)
Alkaline Phosphatase: 58 U/L (ref 39–117)
BILIRUBIN DIRECT: 0.1 mg/dL (ref 0.0–0.3)
BILIRUBIN TOTAL: 0.3 mg/dL (ref 0.2–1.2)
Total Protein: 6.5 g/dL (ref 6.0–8.3)

## 2015-10-10 LAB — IRON: IRON: 31 ug/dL — AB (ref 42–145)

## 2015-10-10 LAB — IBC PANEL
IRON: 31 ug/dL — AB (ref 42–145)
Saturation Ratios: 6.2 % — ABNORMAL LOW (ref 20.0–50.0)
Transferrin: 358 mg/dL (ref 212.0–360.0)

## 2015-10-10 LAB — FERRITIN: Ferritin: 7.7 ng/mL — ABNORMAL LOW (ref 10.0–291.0)

## 2015-10-10 NOTE — Assessment & Plan Note (Signed)
New.  Pt's fatigue may be related to recent tick bites, or hx of psoriatic arthritis or other causes.  Check labs.  Tx underlying abnormalities if present.  Reviewed supportive care and red flags that should prompt return.  Pt expressed understanding and is in agreement w/ plan.

## 2015-10-10 NOTE — Progress Notes (Signed)
   Subjective:    Patient ID: Dawn Hayes, female    DOB: Oct 04, 1970, 45 y.o.   MRN: [id number]  HPI Fatigue- pt reports she hasn't been feeling well for 'about a week'.  'extremely tired- like sleeping for 13 hrs a day'.  + body aches- hx of psoriatic arthritis.  No fevers.  Hx of iron deficiency anemia.  Denies sick contacts.  + tick bites- 4 within the last few weeks.  No rashes.  + HA x1 (migraine)  Did Enbrel injxn last night- does this weekly.   Review of Systems For ROS see HPI     Objective:   Physical Exam  Constitutional: She is oriented to person, place, and time. She appears well-developed and well-nourished. No distress.  obese  HENT:  Head: Normocephalic and atraumatic.  Eyes: Conjunctivae and EOM are normal. Pupils are equal, round, and reactive to light.  Neck: Normal range of motion. Neck supple. No thyromegaly present.  Cardiovascular: Normal rate, regular rhythm, normal heart sounds and intact distal pulses.   No murmur heard. Pulmonary/Chest: Effort normal and breath sounds normal. No respiratory distress.  Abdominal: Soft. She exhibits no distension. There is no tenderness.  Musculoskeletal: She exhibits no edema.  Lymphadenopathy:    She has no cervical adenopathy.  Neurological: She is alert and oriented to person, place, and time.  Skin: Skin is warm and dry.  Psychiatric: She has a normal mood and affect. Her behavior is normal.  Vitals reviewed.         Assessment & Plan:

## 2015-10-10 NOTE — Assessment & Plan Note (Signed)
New.  Pt reports 4 bites in recent weeks.  Now w/ excessive fatigue.  Check Lyme and RMSF titers.

## 2015-10-10 NOTE — Assessment & Plan Note (Signed)
New to provider, ongoing for pt.  Check labs.  Replete iron prn.

## 2015-10-10 NOTE — Progress Notes (Signed)
Pre visit review using our clinic review tool, if applicable. No additional management support is needed unless otherwise documented below in the visit note. 

## 2015-10-10 NOTE — Patient Instructions (Signed)
Follow up as needed We'll notify you of your lab results and make any changes if needed Drink plenty of fluids Tylenol or ibuprofen as needed for body aches REST!!! Call with any questions or concerns Hang in there!!!

## 2015-10-11 ENCOUNTER — Other Ambulatory Visit: Payer: Self-pay | Admitting: General Practice

## 2015-10-11 ENCOUNTER — Telehealth: Payer: Self-pay | Admitting: Family Medicine

## 2015-10-11 MED ORDER — FERROUS SULFATE 325 (65 FE) MG PO TABS: 325.0000 mg | ORAL_TABLET | Freq: Every day | ORAL | Status: DC

## 2015-10-11 NOTE — Telephone Encounter (Signed)
Patient notified of PCP recommendations and is agreement and expresses an understanding.  

## 2015-10-11 NOTE — Telephone Encounter (Signed)
Pt states that she had another migraine last night that lasted about 20 mins amd wanted Dr Birdie Riddle to know.

## 2015-10-11 NOTE — Telephone Encounter (Signed)
Noted.  Please have pt continue to monitor symptoms and let me know if anything changes

## 2015-10-15 ENCOUNTER — Telehealth: Payer: Self-pay | Admitting: General Practice

## 2015-10-15 NOTE — Telephone Encounter (Signed)
Called patient and LMOVM to return call.     

## 2015-10-15 NOTE — Telephone Encounter (Signed)
Spoke with [facility] in regards to this lab, they advised that they did not receive a requisition or a sample.

## 2015-10-15 NOTE — Telephone Encounter (Signed)
-----   Message from Midge Minium, MD sent at 10/15/2015  8:00 AM EDT ----- Please check on her Lyme and RMSF titers  Thanks! kt ----- Message -----    From: SYSTEM    Sent: 10/15/2015  12:05 AM      To: Midge Minium, MD

## 2015-10-15 NOTE — Telephone Encounter (Signed)
Please call pt and see how she is feeling.  If still feeling poorly, she will need to return for blood draw.

## 2015-10-17 NOTE — Telephone Encounter (Signed)
Called and LMOVM for pt again today. Closing encounter until pt returns call.

## 2015-10-19 ENCOUNTER — Telehealth: Payer: Self-pay | Admitting: General Practice

## 2015-10-19 DIAGNOSIS — H539 Unspecified visual disturbance: Secondary | ICD-10-CM

## 2015-10-19 DIAGNOSIS — G43009 Migraine without aura, not intractable, without status migrainosus: Secondary | ICD-10-CM

## 2015-10-19 NOTE — Telephone Encounter (Signed)
Pt informed, stated an understanding and referral placed.

## 2015-10-19 NOTE — Telephone Encounter (Signed)
Pt wanted to inform you that she had another optical migraine last night.

## 2015-10-19 NOTE — Telephone Encounter (Signed)
This is the 3rd episode of this.  I want her to see Neuro for a complete assessment of these headaches and visual changes.  Please place referral and let pt know

## 2015-11-22 ENCOUNTER — Telehealth: Payer: Self-pay | Admitting: Family Medicine

## 2015-11-22 MED ORDER — NYSTATIN 100000 UNIT/ML MT SUSP: 5.0000 mL | Freq: Four times a day (QID) | OROMUCOSAL | Status: DC

## 2015-11-22 NOTE — Telephone Encounter (Signed)
Medication filled to pharmacy as requested.  Pt informed and will advise Rhe[facility] as this is the only med that treats her costochondritis.

## 2015-11-22 NOTE — Telephone Encounter (Signed)
Pt states that she has just finished a steroid from her rheumatologist which has gave her thrush and asking for magic mouth wash, [facility] in [place]

## 2015-11-22 NOTE — Telephone Encounter (Signed)
Dawn Hayes is treated w/ Nystatin swish and spit, not Magic Mouthwash.  67ml of 100,000 units/ml 4x day, 434ml But she also needs to inform Rheum about this

## 2015-12-04 ENCOUNTER — Encounter: Payer: Self-pay | Admitting: General Practice

## 2015-12-04 ENCOUNTER — Encounter: Payer: Self-pay | Admitting: Family Medicine

## 2015-12-04 ENCOUNTER — Ambulatory Visit (INDEPENDENT_AMBULATORY_CARE_PROVIDER_SITE_OTHER): Payer: Commercial Managed Care - HMO | Admitting: Family Medicine

## 2015-12-04 VITALS — BP 122/84 | HR 83 | Temp 97.9°F | Resp 16 | Ht 61.0 in | Wt 264.0 lb

## 2015-12-04 DIAGNOSIS — J0101 Acute recurrent maxillary sinusitis: Secondary | ICD-10-CM

## 2015-12-04 DIAGNOSIS — I10 Essential (primary) hypertension: Secondary | ICD-10-CM | POA: Diagnosis not present

## 2015-12-04 DIAGNOSIS — J0100 Acute maxillary sinusitis, unspecified: Secondary | ICD-10-CM | POA: Insufficient documentation

## 2015-12-04 DIAGNOSIS — M940 Chondrocostal junction syndrome [Tietze]: Secondary | ICD-10-CM | POA: Insufficient documentation

## 2015-12-04 MED ORDER — AMOXICILLIN 875 MG PO TABS: 875.0000 mg | ORAL_TABLET | Freq: Two times a day (BID) | ORAL | 0 refills | Status: DC

## 2015-12-04 MED ORDER — OXYCODONE-ACETAMINOPHEN 5-325 MG PO TABS: 1.0000 | ORAL_TABLET | Freq: Three times a day (TID) | ORAL | 0 refills | Status: DC | PRN

## 2015-12-04 NOTE — Progress Notes (Signed)
Pre visit review using our clinic review tool, if applicable. No additional management support is needed unless otherwise documented below in the visit note. 

## 2015-12-04 NOTE — Assessment & Plan Note (Signed)
New to provider, ongoing for pt.  She reports she has not been successful in her previous dieting attempts.  Open to idea of nutrition referral along w/ attending a [place] [facility] seminar.  Discussed need for healthy diet.  Will follow closely.

## 2015-12-04 NOTE — Progress Notes (Unsigned)
Pre visit review using our clinic review tool, if applicable. No additional management support is needed unless otherwise documented below in the visit note. 

## 2015-12-04 NOTE — Assessment & Plan Note (Signed)
New to provider, ongoing for pt.  Currently well controlled on Lisinopril and HCTZ daily.  Reviewed recent labs.  No med changes at this time.

## 2015-12-04 NOTE — Assessment & Plan Note (Signed)
New.  Pt's sxs and PE consistent w/ infxn.  Start abx.  Cough meds prn.  Reviewed supportive care and red flags that should prompt return.  Pt expressed understanding and is in agreement w/ plan.

## 2015-12-04 NOTE — Progress Notes (Signed)
   Subjective:    Patient ID: Dawn Hayes, female    DOB: Apr 11, 1971, 45 y.o.   MRN: [id number]  HPI Health Maintenance- UTD on colonoscopy, due [facility], UTD on pap.  UTD on Tdap.  R ear pain- pt reports sinus pressure, intermittent bloody drainage x1.5 days.  + dry, hacking cough. + nasal congestion.  No fevers.  On Enbrel.  + sick contacts.  No N/V.  Costochondritis- pt has hx of recurrent pain.  She usually requires steroids and has been to ER in the past.  Pt was given Vicodin in the past but this causes nausea/vomiting.  Has script for Oxy to take PRN- script from 2016 and she has 2 pills remaining.  HTN- chronic problem, on Lisinopril and HCTZ daily w/ good control.  No CP, SOB, HAs, visual changes, edema.  Obesity- chronic problem for pt.  She reports she is not able to exercise due to psoriatic arthritis.  She has 'tried every diet out there' w/o success.  Review of Systems For ROS see HPI     Objective:   Physical Exam  Constitutional: She is oriented to person, place, and time. She appears well-developed and well-nourished. No distress.  HENT:  Head: Normocephalic and atraumatic.  Right Ear: Tympanic membrane normal.  Left Ear: Tympanic membrane normal.  Nose: Mucosal edema and rhinorrhea present. Right sinus exhibits maxillary sinus tenderness. Right sinus exhibits no frontal sinus tenderness. Left sinus exhibits maxillary sinus tenderness. Left sinus exhibits no frontal sinus tenderness.  Mouth/Throat: Uvula is midline and mucous membranes are normal. Posterior oropharyngeal erythema present. No oropharyngeal exudate.  Eyes: Conjunctivae and EOM are normal. Pupils are equal, round, and reactive to light.  Neck: Normal range of motion. Neck supple.  Cardiovascular: Normal rate, regular rhythm and normal heart sounds.   Pulmonary/Chest: Effort normal and breath sounds normal. No respiratory distress. She has no wheezes.  Lymphadenopathy:    She has no  cervical adenopathy.  Neurological: She is alert and oriented to person, place, and time.  Skin: Skin is warm and dry.  Psychiatric: She has a normal mood and affect. Her behavior is normal. Thought content normal.          Assessment & Plan:

## 2015-12-04 NOTE — Patient Instructions (Addendum)
Schedule your complete physical in 6 months Start the Amoxicillin twice daily- take w/ food Drink plenty of fluids Add daily Claritin or Zyrtec Mucinex DM for cough/congestion Use the Oxycodone as needed only for severe pain Call and schedule your mammogram at the [facility] We'll call you with your nutrition appt (this will be at the diabetes education center so don't get concerned!) Call [facility] at [phone] or check them out at  [email or web address]  Call with any questions or concerns Enjoy the rest of your summer!!!

## 2015-12-04 NOTE — Assessment & Plan Note (Signed)
New to provider, ongoing for pt.  She gets steroid scripts from Rhe[facility] but her previous PCP gave her a low quantity Oxycodone for severe pain.  Still has 2 pills remaining from 2016 script.  Script provided in good faith- will monitor use.

## 2015-12-06 ENCOUNTER — Ambulatory Visit: Payer: Commercial Managed Care - HMO | Admitting: Family Medicine

## 2015-12-17 NOTE — Progress Notes (Signed)
45 y.o. G0P0 Married Caucasian female here for annual exam.    Menses in mid May.  No menses in June. Menses from 11/28/15 - now.  First time she has had an irregular menses.  Usually has a menses every month.  Usually is heavy with tampon and pad change every 1 - 1.5 hours.  Cramps for first two days.   Hx anemia due to menses.  Has low iron/ferritin, but his is improving.  On supplemental iron.  Smoker.  Knows she needs to quit.   Hx of infertility.   UPT:  Negative  PCP:  [name], MD   Patient's last menstrual period was 11/28/2015.     Period Duration (Days): 4 Period Pattern: Regular (July periord very long) Menstrual Flow: Moderate Menstrual Control: Tampon, Maxi pad Dysmenorrhea: (!) Severe Dysmenorrhea Symptoms: Cramping     Sexually active: Yes.   female The current method of family planning is none.    Exercising: Yes.    Walking and yoga Smoker:  no  Health Maintenance: Pap:  12-27-12 Neg:Neg HR HPV History of abnormal Pap:  no MMG:  06-21-14 Density A/Neg/BiRads1:[facility] Colonoscopy:  05-30-15 diverticulosis with Dr. Leota Jacobsen when next one is due. BMD:   2000  Result  normal TDaP:  2013 Gardasil:   N/A   Screening Labs:  Hb today: PCP, Urine today: moderate RBCs--see LMP   reports that she has been smoking Cigarettes.  She has smoked for the past 20.00 years. She has never used smokeless tobacco. She reports that she drinks about 9.6 oz of alcohol per week . She reports that she does not use drugs.  Past Medical History:  Diagnosis Date  . Costochondritis   . Diverticulitis   . Dysmenorrhea   . GERD (gastroesophageal reflux disease)   . Hypertension   . Menorrhagia   . Psoriatic arthritis ([place])   . Smoker     Past Surgical History:  Procedure Laterality Date  . CARPAL TUNNEL RELEASE Left 01/19/2015   Procedure: LEFT CARPAL TUNNEL RELEASE;  Surgeon: Roseanne Kaufman, MD;  Location: [facility];  Service:  Orthopedics;  Laterality: Left;  . CHOLECYSTECTOMY    . CHOLECYSTECTOMY, LAPAROSCOPIC  2006  . COLONOSCOPY  1998   normal, [place]  . COLONOSCOPY N/A 05/30/2015   RMR: Minimal anal canal hemorrhoids. colonic diverticulosis. I suspect relatively trivial recent gI Bleed likely related to hemorrhoids. This finding alone would not likely adequatly explain her degree of anemia. she likely has a significent component from menstrual losses. , etc.   . ESOPHAGOGASTRODUODENOSCOPY  09/11/99   tiny esophageal erosions with mild erosive reflux/no barrett's/normal stomach  . ESOPHAGOGASTRODUODENOSCOPY N/A 05/30/2015   RMR: Hiatal hernia otherwise negative EGD    Current Outpatient Prescriptions  Medication Sig Dispense Refill  . ENBREL SURECLICK 50 MG/ML injection Inject 50 mg as directed once a week. Wednesday    . esomeprazole (NEXIUM) 40 MG capsule Take 1 capsule (40 mg total) by mouth daily before breakfast. 30 capsule 11  . ferrous sulfate 325 (65 FE) MG tablet Take 1 tablet (325 mg total) by mouth daily with breakfast. 30 tablet 6  . hydrochlorothiazide (HYDRODIURIL) 25 MG tablet Take 25 tablets by mouth daily.     Marland Kitchen lisinopril (PRINIVIL,ZESTRIL) 10 MG tablet Take 10 mg by mouth daily.    Marland Kitchen oxyCODONE-acetaminophen (ROXICET) 5-325 MG tablet Take 1 tablet by mouth every 8 (eight) hours as needed for severe pain. 20 tablet 0   No current facility-administered medications  for this visit.     Family History  Problem Relation Age of Onset  . Brain cancer Father   . Prostate cancer Father   . Hyperlipidemia Mother   . Colon cancer Maternal Grandmother 53  . Stroke Paternal Grandfather     COD  . Aneurysm Maternal Grandfather     ROS:  Pertinent items are noted in HPI.  Otherwise, a comprehensive ROS was negative.  Exam:   BP 122/70 (BP Location: Right Arm, Patient Position: Sitting, Cuff Size: Large)   Pulse 80   Resp 18   Ht 5\' 2"  (1.575 m)   Wt 262 lb 6.4 oz (119 kg)   LMP 11/28/2015  Comment: has continued to bleed since 11-28-15  BMI 47.99 kg/m     General appearance: alert, cooperative and appears stated age Head: Normocephalic, without obvious abnormality, atraumatic Neck: no adenopathy, supple, symmetrical, trachea midline and thyroid normal to inspection and palpation Lungs: clear to auscultation bilaterally Breasts: normal appearance, no masses or tenderness, No nipple retraction or dimpling, No nipple discharge or bleeding, No axillary or supraclavicular adenopathy Heart: regular rate and rhythm Abdomen: obese, soft, non-tender; no masses, no organomegaly Extremities: extremities normal, atraumatic, no cyanosis or edema Skin: Skin color, texture, turgor normal. No rashes or lesions Lymph nodes: Cervical, supraclavicular, and axillary nodes normal. No abnormal inguinal nodes palpated Neurologic: Grossly normal  Pelvic: External genitalia:  no lesions              Urethra:  normal appearing urethra with no masses, tenderness or lesions              Bartholins and Skenes: normal                 Vagina: normal appearing vagina with normal color and discharge, no lesions.  Red vaginal blood.               Cervix: no lesions              Pap taken: No. Bimanual Exam:  Uterus:  normal size, contour, position, consistency, mobility, non-tender              Adnexa: no mass, fullness, tenderness              BM exam limited by body habitus.              Rectal exam: Yes.    Confirms.              Anus:  normal sphincter tone, no lesions  Chaperone was present for exam.  Assessment:   Well woman visit. Infertility.  Menorrhagia with irregular menses.  Hx anemia.  HTN. Smoker.  Plan: Yearly mammogram recommended after age 72.  Recommended self breast exam.  Pap and HR HPV as above. Discussed Calcium, Vitamin D, regular exercise program including cardiovascular and weight bearing exercise. Declines smoking cessation assistance.  States she knows how to quit,  she just needs to do it. Follow up annually and prn.   Additional counseling given.  Yes.  . _15______ minutes face to face time of which over 50% was spent in counseling regarding menorrhagia with irregular menses.  Will treat with Provera 10 mg x 10 days.  Follow up for pelvic ultrasound and potential EMB.  Procedures explained.  Discussed Micronor and progesterone IUDs for control of menorrhagia.    After visit summary provided.

## 2015-12-18 ENCOUNTER — Encounter: Payer: Self-pay | Admitting: Neurology

## 2015-12-18 ENCOUNTER — Ambulatory Visit (INDEPENDENT_AMBULATORY_CARE_PROVIDER_SITE_OTHER): Payer: Commercial Managed Care - HMO | Admitting: Neurology

## 2015-12-18 VITALS — BP 134/82 | HR 104 | Ht 61.5 in | Wt 265.0 lb

## 2015-12-18 DIAGNOSIS — H539 Unspecified visual disturbance: Secondary | ICD-10-CM

## 2015-12-18 DIAGNOSIS — G43809 Other migraine, not intractable, without status migrainosus: Secondary | ICD-10-CM | POA: Diagnosis not present

## 2015-12-18 DIAGNOSIS — F17200 Nicotine dependence, unspecified, uncomplicated: Secondary | ICD-10-CM

## 2015-12-18 DIAGNOSIS — G43109 Migraine with aura, not intractable, without status migrainosus: Secondary | ICD-10-CM

## 2015-12-18 NOTE — Progress Notes (Signed)
NEUROLOGY CONSULTATION NOTE  JULIAETTE AGATE MRN: [id number] DOB: January 15, 1971  Referring provider: Dr. Birdie Riddle Primary care provider: Dr. Birdie Riddle  Reason for consult:  migraine  HISTORY OF PRESENT ILLNESS: Dawn Hayes is a 45 year old right-handed woman with psoriatic arthritis, cigarette smoker, hypertension, anemia, GERD, morbid obesity who presents for migraine.  History obtained by patient and PCP note.  Since May, she has experienced 5 episodes of transient visual disturbance.  She reports seeing flashing bright orbs in her vision, either bilaterally or just the right eye.  It lasts about 10 to 20 minutes and resolves.  There is some mild nausea but no associated headache, dizziness or unilateral weakness or numbness.  She was evaluated by ophthalmology and had a normal exam.  She was diagnosed with ocular migraines.  The only change preceding onset of these episodes was having started Embrel injections in March.  She has no personal history or family history of headaches or migraines.   Labs from May:  CBC with WBC 9.2, HGB 11, HCT 34.3, PLT 322; BMP with Na 134, K 4.1, Cl 101, BUN 13 and Cr 0.65; Hepatic panel with TB 0.3, ALP 58, AST 35 and ALT 41.Marland Kitchen  PAST MEDICAL HISTORY: Past Medical History:  Diagnosis Date  . Costochondritis   . Diverticulitis   . Dysmenorrhea   . GERD (gastroesophageal reflux disease)   . Hypertension   . Menorrhagia   . Psoriatic arthritis ([place])   . Smoker     PAST SURGICAL HISTORY: Past Surgical History:  Procedure Laterality Date  . CARPAL TUNNEL RELEASE Left 01/19/2015   Procedure: LEFT CARPAL TUNNEL RELEASE;  Surgeon: Roseanne Kaufman, MD;  Location: [facility];  Service: Orthopedics;  Laterality: Left;  . CHOLECYSTECTOMY    . CHOLECYSTECTOMY, LAPAROSCOPIC  2006  . COLONOSCOPY  1998   normal,   . COLONOSCOPY N/A 05/30/2015   RMR: Minimal anal canal hemorrhoids. colonic diverticulosis. I suspect relatively trivial  recent gI Bleed likely related to hemorrhoids. This finding alone would not likely adequatly explain her degree of anemia. she likely has a significent component from menstrual losses. , etc.   . ESOPHAGOGASTRODUODENOSCOPY  09/11/99   tiny esophageal erosions with mild erosive reflux/no barrett's/normal stomach  . ESOPHAGOGASTRODUODENOSCOPY N/A 05/30/2015   RMR: Hiatal hernia otherwise negative EGD    MEDICATIONS: Current Outpatient Prescriptions on File Prior to Visit  Medication Sig Dispense Refill  . Apremilast 10 & 20 & 30 MG TBPK Take by mouth.    Scarlette Shorts SURECLICK 50 MG/ML injection Inject 50 mg as directed once a week. Wednesday    . esomeprazole (NEXIUM) 40 MG capsule Take 1 capsule (40 mg total) by mouth daily before breakfast. 30 capsule 11  . ferrous sulfate 325 (65 FE) MG tablet Take 1 tablet (325 mg total) by mouth daily with breakfast. 30 tablet 6  . hydrochlorothiazide (HYDRODIURIL) 25 MG tablet Take 25 tablets by mouth daily.     Marland Kitchen lisinopril (PRINIVIL,ZESTRIL) 10 MG tablet Take 10 mg by mouth daily.    . naproxen (NAPROSYN) 500 MG tablet Take 1 tablet (500 mg total) by mouth 2 (two) times daily with a meal. 30 tablet 0  . oxyCODONE-acetaminophen (ROXICET) 5-325 MG tablet Take 1 tablet by mouth every 8 (eight) hours as needed for severe pain. 20 tablet 0   No current facility-administered medications on file prior to visit.     ALLERGIES: Allergies  Allergen Reactions  . Adhesive [Tape] Other (See Comments)  Hard to remove, due to psoriatic arthritis  . Aspirin Nausea Only    FAMILY HISTORY: Family History  Problem Relation Age of Onset  . Brain cancer Father   . Prostate cancer Father   . Hyperlipidemia Mother   . Colon cancer Maternal Grandmother 55  . Stroke Paternal Grandfather     COD  . Aneurysm Maternal Grandfather     SOCIAL HISTORY: Social History   Social History  . Marital status: Married    Spouse name: N/A  . Number of children: 0  . Years  of education: N/A   Occupational History  . [employer]    Social History Main Topics  . Smoking status: Current Every Day Smoker    Packs/day: 0.50    Years: 20.00    Types: Cigarettes  . Smokeless tobacco: Never Used     Comment: smoking 3 cigarettes a day, hasn't quit completely   . Alcohol use 9.6 oz/week    6 Glasses of wine, 10 Standard drinks or equivalent per week     Comment: socially, weekends 4-5  . Drug use: No  . Sexual activity: Yes    Partners: Male    Birth control/ protection: None   Other Topics Concern  . Not on file   Social History Narrative  . No narrative on file    REVIEW OF SYSTEMS: Constitutional: No fevers, chills, or sweats, no generalized fatigue, change in appetite Eyes: No visual changes, double vision, eye pain Ear, nose and throat: No hearing loss, ear pain, nasal congestion, sore throat Cardiovascular: No chest pain, palpitations Respiratory:  No shortness of breath at rest or with exertion, wheezes GastrointestinaI: No nausea, vomiting, diarrhea, abdominal pain, fecal incontinence Genitourinary:  No dysuria, urinary retention or frequency Musculoskeletal:  No neck pain, back pain Integumentary: No rash, pruritus, skin lesions Neurological: as above Psychiatric: No depression, insomnia, anxiety Endocrine: No palpitations, fatigue, diaphoresis, mood swings, change in appetite, change in weight, increased thirst Hematologic/Lymphatic:  No purpura, petechiae. Allergic/Immunologic: no itchy/runny eyes, nasal congestion, recent allergic reactions, rashes  PHYSICAL EXAM: Vitals:   12/18/15 0741  BP: 134/82  Pulse: (!) 104   General: No acute distress.  Patient appears well-groomed.  Head:  Normocephalic/atraumatic Eyes:  fundi examined but not visualized Neck: supple, no paraspinal tenderness, full range of motion Back: No paraspinal tenderness Heart: regular rate and rhythm Lungs: Clear to auscultation  bilaterally. Vascular: No carotid bruits. Neurological Exam: Mental status: alert and oriented to person, place, and time, recent and remote memory intact, fund of knowledge intact, attention and concentration intact, speech fluent and not dysarthric, language intact. Cranial nerves: CN I: not tested CN II: pupils equal, round and reactive to light, visual fields intact CN III, IV, VI:  full range of motion, no nystagmus, no ptosis CN V: facial sensation intact CN VII: upper and lower face symmetric CN VIII: hearing intact CN IX, X: gag intact, uvula midline CN XI: sternocleidomastoid and trapezius muscles intact CN XII: tongue midline Bulk & Tone: normal, no fasciculations. Motor:  5/5 throughout  Sensation: temperature and vibration sensation intact. Deep Tendon Reflexes:  2+ throughout, toes downgoing.  Finger to nose testing:  Without dysmetria.  Heel to shin:  Without dysmetria.  Gait:  Normal station and stride.  Able to turn and tandem walk. Romberg negative.  IMPRESSION & PLAN: I agree that she has ocular migraines.  However, given that these are new neurologic symptoms with no prior history of migraines, I would  still like to get MRI of brain with and without contrast.  They are fairly infrequent and are not debilitating or disruptive, so we both decided not to start a migraine preventative at this time.  She should also address weight loss and smoking cessation.  She will follow up in 3 months.  Thank you for allowing me to take part in the care of this patient.  Metta Clines, DO  CC:  [name], MD

## 2015-12-18 NOTE — Patient Instructions (Signed)
I do agree that you have ocular migraines.  However, to complete workup, we will get an MRI of brain with and without contrast.  We will contact you with results.  Follow up in 3 months for recheck.

## 2015-12-19 ENCOUNTER — Ambulatory Visit (INDEPENDENT_AMBULATORY_CARE_PROVIDER_SITE_OTHER): Payer: Commercial Managed Care - HMO | Admitting: Obstetrics and Gynecology

## 2015-12-19 ENCOUNTER — Encounter: Payer: Self-pay | Admitting: Obstetrics and Gynecology

## 2015-12-19 VITALS — BP 122/70 | HR 80 | Resp 18 | Ht 62.0 in | Wt 262.4 lb

## 2015-12-19 DIAGNOSIS — Z01419 Encounter for gynecological examination (general) (routine) without abnormal findings: Secondary | ICD-10-CM | POA: Diagnosis not present

## 2015-12-19 DIAGNOSIS — N926 Irregular menstruation, unspecified: Secondary | ICD-10-CM

## 2015-12-19 DIAGNOSIS — N921 Excessive and frequent menstruation with irregular cycle: Secondary | ICD-10-CM | POA: Diagnosis not present

## 2015-12-19 LAB — POCT URINE PREGNANCY: PREG TEST UR: NEGATIVE

## 2015-12-19 MED ORDER — MEDROXYPROGESTERONE ACETATE 10 MG PO TABS: 10.0000 mg | ORAL_TABLET | Freq: Every day | ORAL | 0 refills | Status: DC

## 2015-12-19 NOTE — Patient Instructions (Signed)
Health Maintenance, Female Adopting a healthy lifestyle and getting preventive care can go a long way to promote health and wellness. Talk with your health care provider about what schedule of regular examinations is right for you. This is a good chance for you to check in with your provider about disease prevention and staying healthy. In between checkups, there are plenty of things you can do on your own. Experts have done a lot of research about which lifestyle changes and preventive measures are most likely to keep you healthy. Ask your health care provider for more information. WEIGHT AND DIET  Eat a healthy diet  Be sure to include plenty of vegetables, fruits, low-fat dairy products, and lean protein.  Do not eat a lot of foods high in solid fats, added sugars, or salt.  Get regular exercise. This is one of the most important things you can do for your health.  Most adults should exercise for at least 150 minutes each week. The exercise should increase your heart rate and make you sweat (moderate-intensity exercise).  Most adults should also do strengthening exercises at least twice a week. This is in addition to the moderate-intensity exercise.  Maintain a healthy weight  Body mass index (BMI) is a measurement that can be used to identify possible weight problems. It estimates body fat based on height and weight. Your health care provider can help determine your BMI and help you achieve or maintain a healthy weight.  For females 20 years of age and older:   A BMI below 18.5 is considered underweight.  A BMI of 18.5 to 24.9 is normal.  A BMI of 25 to 29.9 is considered overweight.  A BMI of 30 and above is considered obese.  Watch levels of cholesterol and blood lipids  You should start having your blood tested for lipids and cholesterol at 45 years of age, then have this test every 5 years.  You may need to have your cholesterol levels checked more often if:  Your lipid  or cholesterol levels are high.  You are older than 45 years of age.  You are at high risk for heart disease.  CANCER SCREENING   Lung Cancer  Lung cancer screening is recommended for adults 55-80 years old who are at high risk for lung cancer because of a history of smoking.  A yearly low-dose CT scan of the lungs is recommended for people who:  Currently smoke.  Have quit within the past 15 years.  Have at least a 30-pack-year history of smoking. A pack year is smoking an average of one pack of cigarettes a day for 1 year.  Yearly screening should continue until it has been 15 years since you quit.  Yearly screening should stop if you develop a health problem that would prevent you from having lung cancer treatment.  Breast Cancer  Practice breast self-awareness. This means understanding how your breasts normally appear and feel.  It also means doing regular breast self-exams. Let your health care provider know about any changes, no matter how small.  If you are in your 20s or 30s, you should have a clinical breast exam (CBE) by a health care provider every 1-3 years as part of a regular health exam.  If you are 40 or older, have a CBE every year. Also consider having a breast X-ray (mammogram) every year.  If you have a family history of breast cancer, talk to your health care provider about genetic screening.  If you   are at high risk for breast cancer, talk to your health care provider about having an MRI and a mammogram every year.  Breast cancer gene (BRCA) assessment is recommended for women who have family members with BRCA-related cancers. BRCA-related cancers include:  Breast.  Ovarian.  Tubal.  Peritoneal cancers.  Results of the assessment will determine the need for genetic counseling and BRCA1 and BRCA2 testing. Cervical Cancer Your health care provider may recommend that you be screened regularly for cancer of the pelvic organs (ovaries, uterus, and  vagina). This screening involves a pelvic examination, including checking for microscopic changes to the surface of your cervix (Pap test). You may be encouraged to have this screening done every 3 years, beginning at age 21.  For women ages 30-65, health care providers may recommend pelvic exams and Pap testing every 3 years, or they may recommend the Pap and pelvic exam, combined with testing for human papilloma virus (HPV), every 5 years. Some types of HPV increase your risk of cervical cancer. Testing for HPV may also be done on women of any age with unclear Pap test results.  Other health care providers may not recommend any screening for nonpregnant women who are considered low risk for pelvic cancer and who do not have symptoms. Ask your health care provider if a screening pelvic exam is right for you.  If you have had past treatment for cervical cancer or a condition that could lead to cancer, you need Pap tests and screening for cancer for at least 20 years after your treatment. If Pap tests have been discontinued, your risk factors (such as having a new sexual partner) need to be reassessed to determine if screening should resume. Some women have medical problems that increase the chance of getting cervical cancer. In these cases, your health care provider may recommend more frequent screening and Pap tests. Colorectal Cancer  This type of cancer can be detected and often prevented.  Routine colorectal cancer screening usually begins at 45 years of age and continues through 45 years of age.  Your health care provider may recommend screening at an earlier age if you have risk factors for colon cancer.  Your health care provider may also recommend using home test kits to check for hidden blood in the stool.  A small camera at the end of a tube can be used to examine your colon directly (sigmoidoscopy or colonoscopy). This is done to check for the earliest forms of colorectal  cancer.  Routine screening usually begins at age 50.  Direct examination of the colon should be repeated every 5-10 years through 45 years of age. However, you may need to be screened more often if early forms of precancerous polyps or small growths are found. Skin Cancer  Check your skin from head to toe regularly.  Tell your health care provider about any new moles or changes in moles, especially if there is a change in a mole's shape or color.  Also tell your health care provider if you have a mole that is larger than the size of a pencil eraser.  Always use sunscreen. Apply sunscreen liberally and repeatedly throughout the day.  Protect yourself by wearing long sleeves, pants, a wide-brimmed hat, and sunglasses whenever you are outside. HEART DISEASE, DIABETES, AND HIGH BLOOD PRESSURE   High blood pressure causes heart disease and increases the risk of stroke. High blood pressure is more likely to develop in:  People who have blood pressure in the high end   of the normal range (130-139/85-89 mm Hg).  People who are overweight or obese.  People who are African American.  If you are 38-23 years of age, have your blood pressure checked every 3-5 years. If you are 61 years of age or older, have your blood pressure checked every year. You should have your blood pressure measured twice--once when you are at a hospital or clinic, and once when you are not at a hospital or clinic. Record the average of the two measurements. To check your blood pressure when you are not at a hospital or clinic, you can use:  An automated blood pressure machine at a pharmacy.  A home blood pressure monitor.  If you are between 45 years and 39 years old, ask your health care provider if you should take aspirin to prevent strokes.  Have regular diabetes screenings. This involves taking a blood sample to check your fasting blood sugar level.  If you are at a normal weight and have a low risk for diabetes,  have this test once every three years after 45 years of age.  If you are overweight and have a high risk for diabetes, consider being tested at a younger age or more often. PREVENTING INFECTION  Hepatitis B  If you have a higher risk for hepatitis B, you should be screened for this virus. You are considered at high risk for hepatitis B if:  You were born in a country where hepatitis B is common. Ask your health care provider which countries are considered high risk.  Your parents were born in a high-risk country, and you have not been immunized against hepatitis B (hepatitis B vaccine).  You have HIV or AIDS.  You use needles to inject street drugs.  You live with someone who has hepatitis B.  You have had sex with someone who has hepatitis B.  You get hemodialysis treatment.  You take certain medicines for conditions, including cancer, organ transplantation, and autoimmune conditions. Hepatitis C  Blood testing is recommended for:  Everyone born from [birth year] through 1965.  Anyone with known risk factors for hepatitis C. Sexually transmitted infections (STIs)  You should be screened for sexually transmitted infections (STIs) including gonorrhea and chlamydia if:  You are sexually active and are younger than 45 years of age.  You are older than 45 years of age and your health care provider tells you that you are at risk for this type of infection.  Your sexual activity has changed since you were last screened and you are at an increased risk for chlamydia or gonorrhea. Ask your health care provider if you are at risk.  If you do not have HIV, but are at risk, it may be recommended that you take a prescription medicine daily to prevent HIV infection. This is called pre-exposure prophylaxis (PrEP). You are considered at risk if:  You are sexually active and do not regularly use condoms or know the HIV status of your partner(s).  You take drugs by injection.  You are sexually  active with a partner who has HIV. Talk with your health care provider about whether you are at high risk of being infected with HIV. If you choose to begin PrEP, you should first be tested for HIV. You should then be tested every 3 months for as long as you are taking PrEP.  PREGNANCY   If you are premenopausal and you may become pregnant, ask your health care provider about preconception counseling.  If you may  become pregnant, take 400 to 800 micrograms (mcg) of folic acid every day.  If you want to prevent pregnancy, talk to your health care provider about birth control (contraception). OSTEOPOROSIS AND MENOPAUSE   Osteoporosis is a disease in which the bones lose minerals and strength with aging. This can result in serious bone fractures. Your risk for osteoporosis can be identified using a bone density scan.  If you are 61 years of age or older, or if you are at risk for osteoporosis and fractures, ask your health care provider if you should be screened.  Ask your health care provider whether you should take a calcium or vitamin D supplement to lower your risk for osteoporosis.  Menopause may have certain physical symptoms and risks.  Hormone replacement therapy may reduce some of these symptoms and risks. Talk to your health care provider about whether hormone replacement therapy is right for you.  HOME CARE INSTRUCTIONS   Schedule regular health, dental, and eye exams.  Stay current with your immunizations.   Do not use any tobacco products including cigarettes, chewing tobacco, or electronic cigarettes.  If you are pregnant, do not drink alcohol.  If you are breastfeeding, limit how much and how often you drink alcohol.  Limit alcohol intake to no more than 1 drink per day for nonpregnant women. One drink equals 12 ounces of beer, 5 ounces of wine, or 1 ounces of hard liquor.  Do not use street drugs.  Do not share needles.  Ask your health care provider for help if  you need support or information about quitting drugs.  Tell your health care provider if you often feel depressed.  Tell your health care provider if you have ever been abused or do not feel safe at home.   This information is not intended to replace advice given to you by your health care provider. Make sure you discuss any questions you have with your health care provider.   Document Released: 11/11/2010 Document Revised: 05/19/2014 Document Reviewed: 03/30/2013 Elsevier Interactive Patient Education [employer].

## 2015-12-27 ENCOUNTER — Encounter: Payer: Self-pay | Admitting: Obstetrics and Gynecology

## 2015-12-27 ENCOUNTER — Other Ambulatory Visit: Payer: Self-pay | Admitting: Obstetrics and Gynecology

## 2015-12-27 ENCOUNTER — Ambulatory Visit (INDEPENDENT_AMBULATORY_CARE_PROVIDER_SITE_OTHER): Payer: Commercial Managed Care - HMO

## 2015-12-27 ENCOUNTER — Ambulatory Visit (INDEPENDENT_AMBULATORY_CARE_PROVIDER_SITE_OTHER): Payer: Commercial Managed Care - HMO | Admitting: Obstetrics and Gynecology

## 2015-12-27 ENCOUNTER — Ambulatory Visit
Admission: RE | Admit: 2015-12-27 | Discharge: 2015-12-27 | Disposition: A | Discharge status: Home / Self Care | Payer: Commercial Managed Care - HMO | Source: Ambulatory Visit | Attending: Neurology | Admitting: Neurology

## 2015-12-27 DIAGNOSIS — R9389 Abnormal findings on diagnostic imaging of other specified body structures: Secondary | ICD-10-CM

## 2015-12-27 DIAGNOSIS — R938 Abnormal findings on diagnostic imaging of other specified body structures: Secondary | ICD-10-CM | POA: Diagnosis not present

## 2015-12-27 DIAGNOSIS — N921 Excessive and frequent menstruation with irregular cycle: Secondary | ICD-10-CM

## 2015-12-27 DIAGNOSIS — H539 Unspecified visual disturbance: Secondary | ICD-10-CM

## 2015-12-27 LAB — CBC
HEMATOCRIT: 32.8 % — AB (ref 35.0–45.0)
Hemoglobin: 10.4 g/dL — ABNORMAL LOW (ref 11.7–15.5)
MCH: 27.9 pg (ref 27.0–33.0)
MCHC: 31.7 g/dL — ABNORMAL LOW (ref 32.0–36.0)
MCV: 87.9 fL (ref 80.0–100.0)
MPV: 9.6 fL (ref 7.5–12.5)
PLATELETS: 339 10*3/uL (ref 140–400)
RBC: 3.73 MIL/uL — AB (ref 3.80–5.10)
RDW: 17.3 % — ABNORMAL HIGH (ref 11.0–15.0)
WBC: 8.5 10*3/uL (ref 3.8–10.8)

## 2015-12-27 MED ORDER — GADOBENATE DIMEGLUMINE 529 MG/ML IV SOLN: 20.0000 mL | Freq: Once | INTRAVENOUS | Status: DC | PRN

## 2015-12-27 NOTE — Telephone Encounter (Signed)
Medication refill request: medroxyPROGESTERone 10mg  Last AEX:  12/19/15 BS Next AEX: not scheduled yet; patient has office visit today 12/27/15 for [place] Last MMG (if hormonal medication request): 06/19/14 BIRADS1 negative Refill authorized: 12/19/15 #10 w/0 refills; today refused,

## 2015-12-27 NOTE — Patient Instructions (Signed)

## 2015-12-27 NOTE — Progress Notes (Signed)
Patient ID: Dawn Hayes, female   DOB: [birth year], 45 y.o.   MRN: [id number] GYNECOLOGY  VISIT   HPI: 45 y.o.   Married  Caucasian  female   G0P0 with Patient's last menstrual period was 11/19/2015 (approximate).   here for pelvic ultrasound for menorrhagia with irregular menses.    Seen on 12/19/15 for office visit for annual exam and reported abnormal uterine bleeding. Menses from 11/28/15 to current.  First irregular menses ever. Using tampon and pad with change every 1 - 1.5 hours. Tx with course of Provera 10 mg x 10 days.  Has 2 more days.   UPT negative on 12/19/15.  GYNECOLOGIC HISTORY: Patient's last menstrual period was 11/19/2015 (approximate). Contraception:  none Menopausal hormone therapy:  n/a Last mammogram:  06-21-14 Density A/Neg/BiRads1:[facility]  Last pap smear:     12-27-12 Neg:Neg HR HPV        OB History    Gravida Para Term Preterm AB Living   0             SAB TAB Ectopic Multiple Live Births                     Patient Active Problem List   Diagnosis Date Noted  . Costochondritis 12/04/2015  . Maxillary sinusitis, acute 12/04/2015  . Anemia, iron deficiency 10/10/2015  . Tick bites 10/10/2015  . Fatigue 10/10/2015  . Allergic rhinitis 09/07/2015  . Hemorrhoid   . Diverticulosis of colon without hemorrhage   . Hiatal hernia   . Anemia 05/08/2015  . Rectal bleeding 05/08/2015  . Chest pain, rule out acute myocardial infarction 09/27/2013  . Abnormal finding on EKG 09/16/2013  . Psoriasis 09/16/2013  . Psoriatic arthritis ([place]) 09/16/2013  . HTN (hypertension) 09/16/2013  . Morbid obesity- BMI 47 09/16/2013  . GERD 12/11/2009  . DIARRHEA, CHRONIC 12/11/2009    Past Medical History:  Diagnosis Date  . Costochondritis   . Diverticulitis   . Dysmenorrhea   . GERD (gastroesophageal reflux disease)   . Hypertension   . Menorrhagia   . Psoriatic arthritis ([place])   . Smoker     Past Surgical History:  Procedure Laterality Date  .  CARPAL TUNNEL RELEASE Left 01/19/2015   Procedure: LEFT CARPAL TUNNEL RELEASE;  Surgeon: Roseanne Kaufman, MD;  Location: [facility];  Service: Orthopedics;  Laterality: Left;  . CHOLECYSTECTOMY    . CHOLECYSTECTOMY, LAPAROSCOPIC  2006  . COLONOSCOPY  1998   normal, [place]  . COLONOSCOPY N/A 05/30/2015   RMR: Minimal anal canal hemorrhoids. colonic diverticulosis. I suspect relatively trivial recent gI Bleed likely related to hemorrhoids. This finding alone would not likely adequatly explain her degree of anemia. she likely has a significent component from menstrual losses. , etc.   . ESOPHAGOGASTRODUODENOSCOPY  09/11/99   tiny esophageal erosions with mild erosive reflux/no barrett's/normal stomach  . ESOPHAGOGASTRODUODENOSCOPY N/A 05/30/2015   RMR: Hiatal hernia otherwise negative EGD    Current Outpatient Prescriptions  Medication Sig Dispense Refill  . ENBREL SURECLICK 50 MG/ML injection Inject 50 mg as directed once a week. Wednesday    . esomeprazole (NEXIUM) 40 MG capsule Take 1 capsule (40 mg total) by mouth daily before breakfast. 30 capsule 11  . ferrous sulfate 325 (65 FE) MG tablet Take 1 tablet (325 mg total) by mouth daily with breakfast. 30 tablet 6  . hydrochlorothiazide (HYDRODIURIL) 25 MG tablet Take 25 tablets by mouth daily.     Marland Kitchen lisinopril (  PRINIVIL,ZESTRIL) 10 MG tablet Take 10 mg by mouth daily.    . medroxyPROGESTERone (PROVERA) 10 MG tablet Take 1 tablet (10 mg total) by mouth daily. 10 tablet 0   No current facility-administered medications for this visit.      ALLERGIES: Adhesive [tape] and Aspirin  Family History  Problem Relation Age of Onset  . Brain cancer Father   . Prostate cancer Father   . Hyperlipidemia Mother   . Colon cancer Maternal Grandmother 59  . Stroke Paternal Grandfather     COD  . Aneurysm Maternal Grandfather     Social History   Social History  . Marital status: Married    Spouse name: N/A  . Number of children:  0  . Years of education: N/A   Occupational History  . [employer]    Social History Main Topics  . Smoking status: Current Every Day Smoker    Years: 20.00    Types: Cigarettes  . Smokeless tobacco: Never Used     Comment: smoking 5 cigarettes a day, hasn't quit completely   . Alcohol use 9.6 oz/week    6 Glasses of wine, 10 Standard drinks or equivalent per week     Comment: socially, weekends 4-5  . Drug use: No  . Sexual activity: Yes    Partners: Male    Birth control/ protection: None   Other Topics Concern  . Not on file   Social History Narrative  . No narrative on file    ROS:  Pertinent items are noted in HPI.  PHYSICAL EXAMINATION:    BP 110/74 (BP Location: Right Arm, Patient Position: Sitting, Cuff Size: Large)   Pulse 84   Ht 5\' 2"  (1.575 m)   Wt 260 lb (117.9 kg)   LMP 11/19/2015 (Approximate) Comment: has continued to bleed since 11-28-15  BMI 47.55 kg/m     General appearance: alert, cooperative and appears stated age    Pelvic ultrasound  Uterus no masses.  EMS 17 mm, cannot rule out mass versus other etiology. Right ovary difficult to see.  Left ovary with 18 mm follicle.  No free fluid.   EMB Consent for procedure.  Speculum placed in vagina.  Blood stained vagina.  Sterile prep with Hibiclens.  Pipelle passed to 7 cm once.  Tissue obtained and sent to pathology.  No complications.  Minimal EBL.  Chaperone was present for exam.  ASSESSMENT  Menorrhagia with irregular menses.  HTN.  Smoker.   PLAN  Discussion of menorrhagia with irregular menses and possible etiologies - anovulatory bleeding, polyp, hyperplasia, malignancy.  Follow up EMB.  Instructions and precautions given.  Finish course of Provera.  Consider Micronor versus Mirena.  CBC today. Pap and HR HPV when bleeding ceases.    An After Visit Summary was printed and given to the patient.  _25_____ minutes face to face time of which over 50% was  spent in counseling.

## 2015-12-28 ENCOUNTER — Encounter: Payer: Self-pay | Admitting: Obstetrics and Gynecology

## 2015-12-28 ENCOUNTER — Telehealth: Payer: Self-pay | Admitting: *Deleted

## 2015-12-28 ENCOUNTER — Encounter: Payer: Self-pay | Admitting: *Deleted

## 2015-12-28 ENCOUNTER — Telehealth: Payer: Self-pay

## 2015-12-28 NOTE — Telephone Encounter (Signed)
My Chart message from patient:  Good Morning! Can you email me a note for yesterday's visit ? I would like to turn something in to my employer.    Thank you!  Dawn Hayes   My email [email or web address]  (for the work note).       Thank you!  Dawn Hayes

## 2015-12-28 NOTE — Telephone Encounter (Signed)
See phone encounter.

## 2015-12-28 NOTE — Telephone Encounter (Signed)
Call to patient. Advised we are not able to provide letter thru e-mail since it is not a secure network. Advised she may pick up a letter or we can fax one. Patient request letter stating she was seen in office yesterday be faxed to [phone].

## 2015-12-28 NOTE — Telephone Encounter (Signed)
-----   Message from Pieter Partridge, DO sent at 12/28/2015  9:02 AM EDT ----- MRI reveals nothing concerning

## 2015-12-28 NOTE — Telephone Encounter (Signed)
Letter faxed as requested.  Routing to provider for final review. Patient agreeable to disposition. Will close encounter.

## 2015-12-28 NOTE — Telephone Encounter (Signed)
Sent via mychart

## 2016-01-01 ENCOUNTER — Telehealth: Payer: Self-pay

## 2016-01-01 ENCOUNTER — Encounter: Payer: Self-pay | Admitting: Obstetrics and Gynecology

## 2016-01-01 NOTE — Telephone Encounter (Signed)
Visit Follow-Up Question  Message [id number]  From [place], MD Sent 01/01/2016 7:54 AM  Good Morning! I have been having cramping and abdominal pain and since my procedure last week. It may not be related but I did not have the cramping before. it is just uncomfortable and not constant...but frequent. The bleeding has not stopped either.    Thank you  Dawn Hayes   Responsible Party

## 2016-01-01 NOTE — Telephone Encounter (Addendum)
Call to patient regarding mychart message. Patient was seen in the office on 12/27/2015 for PUS and EMB. Patient states that she is having intermittent abdominal cramping with sharp pain. Reports she feels this in the middle of her abdomen. Patient is having continual bleeding like her menses. Denies any fever, chills. SOB, dizziness, or fatigue. Advised of message and results as seen below from [place]. States that when she had her PUS she was told she was not a good candidate for a Mirena IUD. Patient did complete 10 day course of Provera on 12/28/2015. Reports Provera slowed her bleeding, but did not stop it. Advised patient she will need to be seen in the office for further evaluation. Offered appointment today, but patient declines. Appointment scheduled for tomorrow 01/02/2016 at 3:30 pm with Dr.Silva. She is agreeable to date and time. Aware if her bleeding increases to having to change a pad/tampon every hour due to bleeding through, pain worsens, or she develops new symptoms she will need to be seen in the office earlier for evaluation or in the ER if it is after office hours. She is agreeable and verbalizes understanding.  Notes Recorded by Nunzio Cobbs, MD on 12/28/2015 at 4:07 PM EDT Please report negative endometrial biopsy to patient. Results showed secretory endometrium.  I recommend treating medically.  Surgery is not needed at this time.  She may do observation following the course of Provera or adopt an other option such as progesterone only birth control pills or a Mirena IUD, both of which we discussed previously.  Please let me know how she would like to proceed forward.  She needs to have her pap smear done also.  She has been bleeding too heavily to do the pap smear.  [place] to provider for final review.

## 2016-01-01 NOTE — Telephone Encounter (Signed)
Addendum made to previous note. I apologize. Patient is scheduled for 01/02/2016 at 3:30 pm with Dr.Silva. Patient declines appointment for today.

## 2016-01-01 NOTE — Telephone Encounter (Signed)
Please see telephone encounter dated with today's date. 

## 2016-01-01 NOTE — Telephone Encounter (Signed)
[place] for appointment for tomorrow.  I will discuss potential for hysteroscopy with dilation and curettage with patient on 01/04/16? She had endometrial thickening on the ultrasound and may actually have a polyp.   Cc- Lamont Snowball.

## 2016-01-02 ENCOUNTER — Encounter: Payer: Self-pay | Admitting: Obstetrics and Gynecology

## 2016-01-02 ENCOUNTER — Ambulatory Visit (INDEPENDENT_AMBULATORY_CARE_PROVIDER_SITE_OTHER): Payer: Commercial Managed Care - HMO | Admitting: Obstetrics and Gynecology

## 2016-01-02 VITALS — BP 110/70 | HR 92 | Resp 18 | Ht 61.5 in | Wt 266.0 lb

## 2016-01-02 DIAGNOSIS — N921 Excessive and frequent menstruation with irregular cycle: Secondary | ICD-10-CM | POA: Diagnosis not present

## 2016-01-02 NOTE — Progress Notes (Signed)
GYNECOLOGY  VISIT   HPI: 45 y.o.   Married  Caucasian  female   G0P0 with Patient's last menstrual period was 11/19/2015 (approximate).   here for   Follow-up after EMB   Had pelvic ultrasound and EMB on 12/27/15 for menorrhagia with irregular menses.  Uterus no masses.  EMS 17 mm, cannot rule out mass versus other etiology. Right ovary difficult to see.  Left ovary with 18 mm follicle.  No free fluid.   EMB showed secretory endometrium.   Patient has been treated with course of Provera and bleeding continues with pad change now every 2 hours instead of every 1 - 1.5 hours.  Cramping now as well.   Discussion about Mirena IUD in the past with her primary GYN provider and told Mirena may not fit her uterus.   Last took Enbrel injection yesterday am.   GYNECOLOGIC HISTORY: Patient's last menstrual period was 11/19/2015 (approximate). Contraception:  none Menopausal hormone therapy:  n/a Last mammogram:  06/21/14 Density A/Neg/BiRADS:1 Last pap smear:   12/27/12 Neg:Neg HR HPV        OB History    Gravida Para Term Preterm AB Living   0             SAB TAB Ectopic Multiple Live Births                     Patient Active Problem List   Diagnosis Date Noted  . Costochondritis 12/04/2015  . Maxillary sinusitis, acute 12/04/2015  . Anemia, iron deficiency 10/10/2015  . Tick bites 10/10/2015  . Fatigue 10/10/2015  . Allergic rhinitis 09/07/2015  . Hemorrhoid   . Diverticulosis of colon without hemorrhage   . Hiatal hernia   . Anemia 05/08/2015  . Rectal bleeding 05/08/2015  . Chest pain, rule out acute myocardial infarction 09/27/2013  . Abnormal finding on EKG 09/16/2013  . Psoriasis 09/16/2013  . Psoriatic arthritis ([place]) 09/16/2013  . HTN (hypertension) 09/16/2013  . Morbid obesity- BMI 47 09/16/2013  . GERD 12/11/2009  . DIARRHEA, CHRONIC 12/11/2009    Past Medical History:  Diagnosis Date  . Costochondritis   . Diverticulitis   . Dysmenorrhea   . GERD  (gastroesophageal reflux disease)   . Hypertension   . Menorrhagia   . Psoriatic arthritis ([place])   . Smoker     Past Surgical History:  Procedure Laterality Date  . CARPAL TUNNEL RELEASE Left 01/19/2015   Procedure: LEFT CARPAL TUNNEL RELEASE;  Surgeon: Roseanne Kaufman, MD;  Location: [facility];  Service: Orthopedics;  Laterality: Left;  . CHOLECYSTECTOMY    . CHOLECYSTECTOMY, LAPAROSCOPIC  2006  . COLONOSCOPY  1998   normal, [place]  . COLONOSCOPY N/A 05/30/2015   RMR: Minimal anal canal hemorrhoids. colonic diverticulosis. I suspect relatively trivial recent gI Bleed likely related to hemorrhoids. This finding alone would not likely adequatly explain her degree of anemia. she likely has a significent component from menstrual losses. , etc.   . ESOPHAGOGASTRODUODENOSCOPY  09/11/99   tiny esophageal erosions with mild erosive reflux/no barrett's/normal stomach  . ESOPHAGOGASTRODUODENOSCOPY N/A 05/30/2015   RMR: Hiatal hernia otherwise negative EGD    Current Outpatient Prescriptions  Medication Sig Dispense Refill  . ENBREL SURECLICK 50 MG/ML injection Inject 50 mg as directed once a week. Wednesday    . esomeprazole (NEXIUM) 40 MG capsule Take 1 capsule (40 mg total) by mouth daily before breakfast. 30 capsule 11  . ferrous sulfate 325 (65 FE) MG tablet Take  1 tablet (325 mg total) by mouth daily with breakfast. 30 tablet 6  . hydrochlorothiazide (HYDRODIURIL) 25 MG tablet Take 25 tablets by mouth daily.     Marland Kitchen lisinopril (PRINIVIL,ZESTRIL) 10 MG tablet Take 10 mg by mouth daily.    Marland Kitchen OTEZLA 10 & 20 & 30 MG TBPK      No current facility-administered medications for this visit.      ALLERGIES: Adhesive [tape] and Aspirin  Family History  Problem Relation Age of Onset  . Brain cancer Father   . Prostate cancer Father   . Hyperlipidemia Mother   . Colon cancer Maternal Grandmother 36  . Stroke Paternal Grandfather     COD  . Aneurysm Maternal Grandfather      Social History   Social History  . Marital status: Married    Spouse name: N/A  . Number of children: 0  . Years of education: N/A   Occupational History  . [employer]    Social History Main Topics  . Smoking status: Current Every Day Smoker    Years: 20.00    Types: Cigarettes  . Smokeless tobacco: Never Used     Comment: smoking 5 cigarettes a day, hasn't quit completely   . Alcohol use 9.6 oz/week    6 Glasses of wine, 10 Standard drinks or equivalent per week     Comment: socially, weekends 4-5  . Drug use: No  . Sexual activity: Yes    Partners: Male    Birth control/ protection: None   Other Topics Concern  . Not on file   Social History Narrative  . No narrative on file    ROS:  Pertinent items are noted in HPI.  PHYSICAL EXAMINATION:    BP 110/70 (BP Location: Right Arm, Patient Position: Sitting, Cuff Size: Large)   Pulse 92   Resp 18   Ht 5' 1.5" (1.562 m)   Wt 266 lb (120.7 kg)   LMP 11/19/2015 (Approximate)   BMI 49.45 kg/m     General appearance: alert, cooperative and appears stated age Head: Normocephalic, without obvious abnormality, atraumatic Neck: no adenopathy, supple, symmetrical, trachea midline and thyroid normal to inspection and palpation Lungs: clear to auscultation bilaterally.  Heart: regular rate and rhythm Abdomen: soft, non-tender, no masses,  no organomegaly Extremities: extremities normal, atraumatic, no cyanosis or edema Skin: Skin color, texture, turgor normal. No rashes or lesions Lymph nodes: Cervical, supraclavicular, and axillary nodes normal. No abnormal inguinal nodes palpated Neurologic: Grossly normal  Pelvic: External genitalia:  no lesions              Urethra:  normal appearing urethra with no masses, tenderness or lesions              Bartholins and Skenes: normal                 Vagina: normal appearing vagina with normal color and discharge, no lesions.  Vaginal bleeding noted and small  clots.              Cervix: no lesions                Bimanual Exam:  Uterus:  normal size, contour, position, consistency, mobility, non-tender              Adnexa: no mass, fullness, tenderness            Chaperone was present for exam.  ASSESSMENT  Menometrorrhagia.  Status post course of Provera.  Anemia.  Obesity.  Psoriatic arthritis.  Takes Enbrel weekly.  HTN.  Smoker.  GERD.   PLAN  We discussed potential endometrial mass/polyp and the lack of response to Provera.  Sonohysterogram can be beneficial to confirm an endometrial mass/polyp, but this will not remove the mass.  We will proceed with hysteroscopy with Myosure resection of mass, dilation and curettage on 01/04/16.  Risks, benefits, and alternatives reviewed. Risks include but are not limited to bleeding, infection, damage to surrounding organs including uterine perforation requiring hospitalization and laparoscopy, pulmonary edema, reaction to anesthesia, DVT, PE, death, need for further treatment and surgery including repeat hysteroscopy, hysterectomy or medical therapy.   The possibility of negative findings also reviewed. Will give abx prophylaxis due to her use of Enbrel.  Surgical expectations and recovery discussed.  Patient wishes to proceed. She may be a good candidate for Mirena after surgical pathology returns.  Her uterine size is adequate for this.    An After Visit Summary was printed and given to the patient.  ___25___ minutes face to face time of which over 50% was spent in counseling.

## 2016-01-02 NOTE — H&P (Signed)
Office Visit   01/02/2016 [place], MD  Obstetrics and Gynecology   Menorrhagia with irregular cycle  Dx   Office Visit ; Referred by Midge Minium, MD  Reason for Visit   Additional Documentation   Vitals:   BP 110/70 (BP Location: Right Arm, Patient Position: Sitting, Cuff Size: Large)   Pulse 92   Resp 18   Ht 5' 1.5" (1.562 m)   Wt 266 lb (120.7 kg)   LMP 11/19/2015 (Approximate)   BMI 49.45 kg/m   BSA 2.29 m   Flowsheets:   Infectious Disease Screening,   Custom Formula Data,   MEWS Score,   Anthropometrics     Encounter Info:   Billing Info,   History,   Allergies,   Detailed Report     All Notes   Progress Notes by Nunzio Cobbs, MD at 01/02/2016 3:30 PM   Author: Nunzio Cobbs, MD Author Type: Physician Filed: 01/02/2016 9:06 PM  Note Status: Signed Cosign: Cosign Not Required Encounter Date: 01/02/2016  Editor: Nunzio Cobbs, MD (Physician)  Prior Versions: 1. Remigio Eisenmenger, CMA (Certified Medical Assistant) at 01/02/2016 3:30 PM - Sign at close encounter    GYNECOLOGY  VISIT   HPI: 45 y.o.   Married  Caucasian  female   G0P0 with Patient's last menstrual period was 11/19/2015 (approximate).   here for   Follow-up after EMB   Had pelvic ultrasound and EMB on 12/27/15 for menorrhagia with irregular menses.  Uterus no masses.  EMS 17 mm, cannot rule out mass versus other etiology. Right ovary difficult to see.  Left ovary with 18 mm follicle.  No free fluid.   EMB showed secretory endometrium.   Patient has been treated with course of Provera and bleeding continues with pad change now every 2 hours instead of every 1 - 1.5 hours.  Cramping now as well.   Discussion about Mirena IUD in the past with her primary GYN provider and told Mirena may not fit her uterus.   Last took Enbrel injection yesterday am.   GYNECOLOGIC HISTORY: Patient's last menstrual  period was 11/19/2015 (approximate). Contraception:  none Menopausal hormone therapy:  n/a Last mammogram:  06/21/14 Density A/Neg/BiRADS:1 Last pap smear:   12/27/12 Neg:Neg HR HPV                OB History    Gravida Para Term Preterm AB Living   0             SAB TAB Ectopic Multiple Live Births                         Patient Active Problem List   Diagnosis Date Noted  . Costochondritis 12/04/2015  . Maxillary sinusitis, acute 12/04/2015  . Anemia, iron deficiency 10/10/2015  . Tick bites 10/10/2015  . Fatigue 10/10/2015  . Allergic rhinitis 09/07/2015  . Hemorrhoid   . Diverticulosis of colon without hemorrhage   . Hiatal hernia   . Anemia 05/08/2015  . Rectal bleeding 05/08/2015  . Chest pain, rule out acute myocardial infarction 09/27/2013  . Abnormal finding on EKG 09/16/2013  . Psoriasis 09/16/2013  . Psoriatic arthritis ([place]) 09/16/2013  . HTN (hypertension) 09/16/2013  . Morbid obesity- BMI 47 09/16/2013  . GERD 12/11/2009  . DIARRHEA, CHRONIC 12/11/2009        Past Medical History:  Diagnosis  Date  . Costochondritis   . Diverticulitis   . Dysmenorrhea   . GERD (gastroesophageal reflux disease)   . Hypertension   . Menorrhagia   . Psoriatic arthritis ([place])   . Smoker          Past Surgical History:  Procedure Laterality Date  . CARPAL TUNNEL RELEASE Left 01/19/2015   Procedure: LEFT CARPAL TUNNEL RELEASE;  Surgeon: Roseanne Kaufman, MD;  Location: [facility];  Service: Orthopedics;  Laterality: Left;  . CHOLECYSTECTOMY    . CHOLECYSTECTOMY, LAPAROSCOPIC  2006  . COLONOSCOPY  1998   normal, [place]  . COLONOSCOPY N/A 05/30/2015   RMR: Minimal anal canal hemorrhoids. colonic diverticulosis. I suspect relatively trivial recent gI Bleed likely related to hemorrhoids. This finding alone would not likely adequatly explain her degree of anemia. she likely has a significent component from menstrual losses. , etc.    . ESOPHAGOGASTRODUODENOSCOPY  09/11/99   tiny esophageal erosions with mild erosive reflux/no barrett's/normal stomach  . ESOPHAGOGASTRODUODENOSCOPY N/A 05/30/2015   RMR: Hiatal hernia otherwise negative EGD          Current Outpatient Prescriptions  Medication Sig Dispense Refill  . ENBREL SURECLICK 50 MG/ML injection Inject 50 mg as directed once a week. Wednesday    . esomeprazole (NEXIUM) 40 MG capsule Take 1 capsule (40 mg total) by mouth daily before breakfast. 30 capsule 11  . ferrous sulfate 325 (65 FE) MG tablet Take 1 tablet (325 mg total) by mouth daily with breakfast. 30 tablet 6  . hydrochlorothiazide (HYDRODIURIL) 25 MG tablet Take 25 tablets by mouth daily.     Marland Kitchen lisinopril (PRINIVIL,ZESTRIL) 10 MG tablet Take 10 mg by mouth daily.    Marland Kitchen OTEZLA 10 & 20 & 30 MG TBPK      No current facility-administered medications for this visit.      ALLERGIES: Adhesive [tape] and Aspirin        Family History  Problem Relation Age of Onset  . Brain cancer Father   . Prostate cancer Father   . Hyperlipidemia Mother   . Colon cancer Maternal Grandmother 12  . Stroke Paternal Grandfather     COD  . Aneurysm Maternal Grandfather     Social History        Social History  . Marital status: Married    Spouse name: N/A  . Number of children: 0  . Years of education: N/A       Occupational History  . [employer]          Social History Main Topics  . Smoking status: Current Every Day Smoker    Years: 20.00    Types: Cigarettes  . Smokeless tobacco: Never Used     Comment: smoking 5 cigarettes a day, hasn't quit completely   . Alcohol use 9.6 oz/week    6 Glasses of wine, 10 Standard drinks or equivalent per week     Comment: socially, weekends 4-5  . Drug use: No  . Sexual activity: Yes    Partners: Male    Birth control/ protection: None       Other Topics Concern  . Not on file      Social  History Narrative  . No narrative on file    ROS:  Pertinent items are noted in HPI.  PHYSICAL EXAMINATION:    BP 110/70 (BP Location: Right Arm, Patient Position: Sitting, Cuff Size: Large)   Pulse 92   Resp 18  Ht 5' 1.5" (1.562 m)   Wt 266 lb (120.7 kg)   LMP 11/19/2015 (Approximate)   BMI 49.45 kg/m     General appearance: alert, cooperative and appears stated age Head: Normocephalic, without obvious abnormality, atraumatic Neck: no adenopathy, supple, symmetrical, trachea midline and thyroid normal to inspection and palpation Lungs: clear to auscultation bilaterally.  Heart: regular rate and rhythm Abdomen: soft, non-tender, no masses,  no organomegaly Extremities: extremities normal, atraumatic, no cyanosis or edema Skin: Skin color, texture, turgor normal. No rashes or lesions Lymph nodes: Cervical, supraclavicular, and axillary nodes normal. No abnormal inguinal nodes palpated Neurologic: Grossly normal  Pelvic: External genitalia:  no lesions              Urethra:  normal appearing urethra with no masses, tenderness or lesions              Bartholins and Skenes: normal                 Vagina: normal appearing vagina with normal color and discharge, no lesions.  Vaginal bleeding noted and small clots.              Cervix: no lesions                Bimanual Exam:  Uterus:  normal size, contour, position, consistency, mobility, non-tender              Adnexa: no mass, fullness, tenderness            Chaperone was present for exam.  ASSESSMENT  Menometrorrhagia.  Status post course of Provera.  Anemia.  Obesity.  Psoriatic arthritis.  Takes Enbrel weekly.  HTN.  Smoker.  GERD.   PLAN  We discussed potential endometrial mass/polyp and the lack of response to Provera.  Sonohysterogram can be beneficial to confirm an endometrial mass/polyp, but this will not remove the mass.  We will proceed with hysteroscopy with Myosure resection of mass, dilation  and curettage on 01/04/16.  Risks, benefits, and alternatives reviewed. Risks include but are not limited to bleeding, infection, damage to surrounding organs including uterine perforation requiring hospitalization and laparoscopy, pulmonary edema, reaction to anesthesia, DVT, PE, death, need for further treatment and surgery including repeat hysteroscopy, hysterectomy or medical therapy.   The possibility of negative findings also reviewed. Will give abx prophylaxis due to her use of Enbrel.  Surgical expectations and recovery discussed.  Patient wishes to proceed. She may be a good candidate for Mirena after surgical pathology returns.  Her uterine size is adequate for this.    An After Visit Summary was printed and given to the patient.  ___25___ minutes face to face time of which over 50% was spent in counseling.

## 2016-01-03 ENCOUNTER — Encounter (HOSPITAL_COMMUNITY): Payer: Self-pay

## 2016-01-03 ENCOUNTER — Encounter (HOSPITAL_COMMUNITY)
Admission: RE | Admit: 2016-01-03 | Discharge: 2016-01-03 | Disposition: A | Discharge status: Home / Self Care | Payer: Commercial Managed Care - HMO | Source: Ambulatory Visit | Attending: Obstetrics and Gynecology | Admitting: Obstetrics and Gynecology

## 2016-01-03 DIAGNOSIS — K219 Gastro-esophageal reflux disease without esophagitis: Secondary | ICD-10-CM | POA: Diagnosis not present

## 2016-01-03 DIAGNOSIS — F17210 Nicotine dependence, cigarettes, uncomplicated: Secondary | ICD-10-CM | POA: Diagnosis not present

## 2016-01-03 DIAGNOSIS — I10 Essential (primary) hypertension: Secondary | ICD-10-CM | POA: Diagnosis not present

## 2016-01-03 DIAGNOSIS — D509 Iron deficiency anemia, unspecified: Secondary | ICD-10-CM | POA: Diagnosis not present

## 2016-01-03 DIAGNOSIS — R938 Abnormal findings on diagnostic imaging of other specified body structures: Secondary | ICD-10-CM | POA: Diagnosis not present

## 2016-01-03 DIAGNOSIS — N921 Excessive and frequent menstruation with irregular cycle: Secondary | ICD-10-CM | POA: Diagnosis not present

## 2016-01-03 DIAGNOSIS — L4050 Arthropathic psoriasis, unspecified: Secondary | ICD-10-CM | POA: Diagnosis not present

## 2016-01-03 DIAGNOSIS — Z6841 Body Mass Index (BMI) 40.0 and over, adult: Secondary | ICD-10-CM | POA: Diagnosis not present

## 2016-01-03 LAB — BASIC METABOLIC PANEL
Anion gap: 8 (ref 5–15)
BUN: 10 mg/dL (ref 6–20)
CO2: 25 mmol/L (ref 22–32)
CREATININE: 0.62 mg/dL (ref 0.44–1.00)
Calcium: 9.4 mg/dL (ref 8.9–10.3)
Chloride: 102 mmol/L (ref 101–111)
GFR calc Af Amer: >60 mL/min (ref >60)
GLUCOSE: 115 mg/dL — AB (ref 65–99)
POTASSIUM: 3.8 mmol/L (ref 3.5–5.1)
SODIUM: 135 mmol/L (ref 135–145)

## 2016-01-03 MED ORDER — CEFOTETAN DISODIUM 2 G IJ SOLR
2.0000 g | INTRAMUSCULAR | Status: AC
  Administered 2016-01-04: 2 g via INTRAVENOUS
  Filled 2016-01-03: qty 2

## 2016-01-03 NOTE — Patient Instructions (Signed)
Your procedure is scheduled on:  Tomorrow, January 04, 2016  Enter through the Main Entrance of [facility] at:  6:00 AM  Pick up the phone at the desk and dial [phone].  Call this number if you have problems the morning of surgery: [phone].  Remember: Do NOT eat food or drink after:  Midnight tonight  Take these medicines the morning of surgery with a SIP OF WATER:  Nexium, Hydrochlorothiazide, Lisinopril  Do NOT wear jewelry (body piercing), metal hair clips/bobby pins, make-up, or nail polish. Do NOT wear lotions, powders, or perfumes.  You may wear deodorant. Do NOT shave for 48 hours prior to surgery. Do NOT bring valuables to the hospital. Contacts, dentures, or bridgework may not be worn into surgery.  Have a responsible adult drive you home and stay with you for 24 hours after your procedure

## 2016-01-03 NOTE — Telephone Encounter (Signed)
Patient was seen in office on 01/02/2016 with Dr.Silva. Please see OV note.  Routing to provider for final review. Patient agreeable to disposition. Will close encounter.

## 2016-01-04 ENCOUNTER — Ambulatory Visit (HOSPITAL_COMMUNITY): Payer: Commercial Managed Care - HMO | Admitting: Certified Registered Nurse Anesthetist

## 2016-01-04 ENCOUNTER — Encounter (HOSPITAL_COMMUNITY)
Admission: RE | Disposition: A | Discharge status: Home / Self Care | Payer: Self-pay | Source: Ambulatory Visit | Attending: Obstetrics and Gynecology

## 2016-01-04 ENCOUNTER — Ambulatory Visit (HOSPITAL_COMMUNITY)
Admission: RE | Admit: 2016-01-04 | Discharge: 2016-01-04 | Disposition: A | Discharge status: Home / Self Care | Payer: Commercial Managed Care - HMO | Source: Ambulatory Visit | Attending: Obstetrics and Gynecology | Admitting: Obstetrics and Gynecology

## 2016-01-04 ENCOUNTER — Encounter (HOSPITAL_COMMUNITY): Payer: Self-pay

## 2016-01-04 DIAGNOSIS — I10 Essential (primary) hypertension: Secondary | ICD-10-CM | POA: Insufficient documentation

## 2016-01-04 DIAGNOSIS — D509 Iron deficiency anemia, unspecified: Secondary | ICD-10-CM | POA: Insufficient documentation

## 2016-01-04 DIAGNOSIS — K219 Gastro-esophageal reflux disease without esophagitis: Secondary | ICD-10-CM | POA: Insufficient documentation

## 2016-01-04 DIAGNOSIS — Z6841 Body Mass Index (BMI) 40.0 and over, adult: Secondary | ICD-10-CM | POA: Insufficient documentation

## 2016-01-04 DIAGNOSIS — R938 Abnormal findings on diagnostic imaging of other specified body structures: Secondary | ICD-10-CM | POA: Insufficient documentation

## 2016-01-04 DIAGNOSIS — N921 Excessive and frequent menstruation with irregular cycle: Secondary | ICD-10-CM | POA: Diagnosis not present

## 2016-01-04 DIAGNOSIS — F17210 Nicotine dependence, cigarettes, uncomplicated: Secondary | ICD-10-CM | POA: Insufficient documentation

## 2016-01-04 DIAGNOSIS — L4050 Arthropathic psoriasis, unspecified: Secondary | ICD-10-CM | POA: Insufficient documentation

## 2016-01-04 HISTORY — PX: DILATATION & CURETTAGE/HYSTEROSCOPY WITH MYOSURE: SHX6511

## 2016-01-04 HISTORY — DX: Anemia, unspecified: D64.9

## 2016-01-04 HISTORY — DX: Headache: R51

## 2016-01-04 HISTORY — DX: Headache, unspecified: R51.9

## 2016-01-04 LAB — CBC
HCT: 32.3 % — ABNORMAL LOW (ref 36.0–46.0)
Hemoglobin: 10.5 g/dL — ABNORMAL LOW (ref 12.0–15.0)
MCH: 28.3 pg (ref 26.0–34.0)
MCHC: 32.5 g/dL (ref 30.0–36.0)
MCV: 87.1 fL (ref 78.0–100.0)
PLATELETS: 308 10*3/uL (ref 150–400)
RBC: 3.71 MIL/uL — AB (ref 3.87–5.11)
RDW: 17.3 % — AB (ref 11.5–15.5)
WBC: 6.6 10*3/uL (ref 4.0–10.5)

## 2016-01-04 LAB — ABO/RH: ABO/RH(D): A POS

## 2016-01-04 LAB — TYPE AND SCREEN
ABO/RH(D): A POS
ANTIBODY SCREEN: NEGATIVE

## 2016-01-04 LAB — PREGNANCY, URINE: PREG TEST UR: NEGATIVE

## 2016-01-04 SURGERY — DILATATION & CURETTAGE/HYSTEROSCOPY WITH MYOSURE
Anesthesia: General

## 2016-01-04 MED ORDER — KETOROLAC TROMETHAMINE 30 MG/ML IJ SOLN
INTRAMUSCULAR | Status: AC
  Filled 2016-01-04: qty 1

## 2016-01-04 MED ORDER — LIDOCAINE HCL 1 % IJ SOLN
INTRAMUSCULAR | Status: AC
  Filled 2016-01-04: qty 20

## 2016-01-04 MED ORDER — MIDAZOLAM HCL 2 MG/2ML IJ SOLN
INTRAMUSCULAR | Status: AC
  Filled 2016-01-04: qty 2

## 2016-01-04 MED ORDER — PROPOFOL 10 MG/ML IV BOLUS
INTRAVENOUS | Status: AC
  Filled 2016-01-04: qty 20

## 2016-01-04 MED ORDER — ONDANSETRON HCL 4 MG/2ML IJ SOLN
INTRAMUSCULAR | Status: AC
  Filled 2016-01-04: qty 2

## 2016-01-04 MED ORDER — FENTANYL CITRATE (PF) 100 MCG/2ML IJ SOLN
INTRAMUSCULAR | Status: DC | PRN
  Administered 2016-01-04: 100 ug via INTRAVENOUS

## 2016-01-04 MED ORDER — SUGAMMADEX SODIUM 500 MG/5ML IV SOLN
INTRAVENOUS | Status: AC
  Filled 2016-01-04: qty 5

## 2016-01-04 MED ORDER — SUCCINYLCHOLINE CHLORIDE 200 MG/10ML IV SOSY
PREFILLED_SYRINGE | INTRAVENOUS | Status: AC
Start: 2016-01-04 — End: 2016-01-04
  Filled 2016-01-04: qty 10

## 2016-01-04 MED ORDER — ROCURONIUM 10MG/ML (10ML) SYRINGE FOR MEDFUSION PUMP - OPTIME
INTRAVENOUS | Status: DC | PRN
  Administered 2016-01-04: 5 mg via INTRAVENOUS
  Administered 2016-01-04: 20 mg via INTRAVENOUS

## 2016-01-04 MED ORDER — LACTATED RINGERS IV SOLN
INTRAVENOUS | Status: DC
  Administered 2016-01-04: 125 mL/h via INTRAVENOUS
  Administered 2016-01-04: 09:00:00 via INTRAVENOUS

## 2016-01-04 MED ORDER — SCOPOLAMINE 1 MG/3DAYS TD PT72
1.0000 | MEDICATED_PATCH | Freq: Once | TRANSDERMAL | Status: DC
  Administered 2016-01-04: 1.5 mg via TRANSDERMAL

## 2016-01-04 MED ORDER — FENTANYL CITRATE (PF) 100 MCG/2ML IJ SOLN
25.0000 ug | INTRAMUSCULAR | Status: DC | PRN
  Administered 2016-01-04 (×2): 50 ug via INTRAVENOUS

## 2016-01-04 MED ORDER — ONDANSETRON HCL 4 MG/2ML IJ SOLN
INTRAMUSCULAR | Status: DC | PRN
  Administered 2016-01-04: 4 mg via INTRAVENOUS

## 2016-01-04 MED ORDER — DEXAMETHASONE SODIUM PHOSPHATE 4 MG/ML IJ SOLN
INTRAMUSCULAR | Status: AC
  Filled 2016-01-04: qty 1

## 2016-01-04 MED ORDER — LIDOCAINE HCL (CARDIAC) 20 MG/ML IV SOLN
INTRAVENOUS | Status: AC
  Filled 2016-01-04: qty 5

## 2016-01-04 MED ORDER — PROMETHAZINE HCL 25 MG/ML IJ SOLN
6.2500 mg | INTRAMUSCULAR | Status: DC | PRN
Start: 2016-01-04 — End: 2016-01-04

## 2016-01-04 MED ORDER — SUCCINYLCHOLINE CHLORIDE 20 MG/ML IJ SOLN
INTRAMUSCULAR | Status: DC | PRN
  Administered 2016-01-04: 100 mg via INTRAVENOUS

## 2016-01-04 MED ORDER — LIDOCAINE HCL (CARDIAC) 20 MG/ML IV SOLN
INTRAVENOUS | Status: DC | PRN
  Administered 2016-01-04: 100 mg via INTRAVENOUS

## 2016-01-04 MED ORDER — DEXAMETHASONE SODIUM PHOSPHATE 10 MG/ML IJ SOLN
INTRAMUSCULAR | Status: DC | PRN
  Administered 2016-01-04: 4 mg via INTRAVENOUS

## 2016-01-04 MED ORDER — SCOPOLAMINE 1 MG/3DAYS TD PT72
MEDICATED_PATCH | TRANSDERMAL | Status: AC
  Administered 2016-01-04: 1.5 mg via TRANSDERMAL
  Filled 2016-01-04: qty 1

## 2016-01-04 MED ORDER — FENTANYL CITRATE (PF) 100 MCG/2ML IJ SOLN
INTRAMUSCULAR | Status: AC
  Administered 2016-01-04: 50 ug via INTRAVENOUS
  Filled 2016-01-04: qty 2

## 2016-01-04 MED ORDER — SUGAMMADEX SODIUM 500 MG/5ML IV SOLN
INTRAVENOUS | Status: DC | PRN
  Administered 2016-01-04: 240 mg via INTRAVENOUS

## 2016-01-04 MED ORDER — FENTANYL CITRATE (PF) 100 MCG/2ML IJ SOLN
INTRAMUSCULAR | Status: AC
  Filled 2016-01-04: qty 2

## 2016-01-04 MED ORDER — PROPOFOL 10 MG/ML IV BOLUS
INTRAVENOUS | Status: DC | PRN
  Administered 2016-01-04: 100 mg via INTRAVENOUS
  Administered 2016-01-04: 50 mg via INTRAVENOUS
  Administered 2016-01-04: 200 mg via INTRAVENOUS

## 2016-01-04 MED ORDER — IBUPROFEN 800 MG PO TABS: 800.0000 mg | ORAL_TABLET | Freq: Three times a day (TID) | ORAL | 0 refills | Status: DC | PRN

## 2016-01-04 MED ORDER — MIDAZOLAM HCL 2 MG/2ML IJ SOLN
INTRAMUSCULAR | Status: DC | PRN
  Administered 2016-01-04: 2 mg via INTRAVENOUS

## 2016-01-04 MED ORDER — SODIUM CHLORIDE 0.9 % IR SOLN
Status: DC | PRN
  Administered 2016-01-04: 3000 mL

## 2016-01-04 MED ORDER — ROCURONIUM BROMIDE 100 MG/10ML IV SOLN
INTRAVENOUS | Status: DC | PRN
  Administered 2016-01-04: 5 mg via INTRAVENOUS

## 2016-01-04 MED ORDER — ROCURONIUM BROMIDE 100 MG/10ML IV SOLN
INTRAVENOUS | Status: AC
  Filled 2016-01-04: qty 1

## 2016-01-04 MED ORDER — LIDOCAINE HCL 1 % IJ SOLN
INTRAMUSCULAR | Status: DC | PRN
  Administered 2016-01-04: 10 mL

## 2016-01-04 SURGICAL SUPPLY — 21 items
CANISTER SUCT 3000ML (MISCELLANEOUS) ×3 IMPLANT
CATH ROBINSON RED A/P 16FR (CATHETERS) ×3 IMPLANT
CLOTH BEACON ORANGE TIMEOUT ST (SAFETY) ×3 IMPLANT
CONTAINER PREFILL 10% NBF 60ML (FORM) ×6 IMPLANT
DEVICE MYOSURE LITE (MISCELLANEOUS) IMPLANT
DEVICE MYOSURE REACH (MISCELLANEOUS) IMPLANT
ELECT REM PT RETURN 9FT ADLT (ELECTROSURGICAL)
ELECTRODE REM PT RTRN 9FT ADLT (ELECTROSURGICAL) IMPLANT
FILTER ARTHROSCOPY CONVERTOR (FILTER) ×3 IMPLANT
GLOVE BIO SURGEON STRL SZ 6.5 (GLOVE) ×2 IMPLANT
GLOVE BIO SURGEONS STRL SZ 6.5 (GLOVE) ×1
GLOVE BIOGEL PI IND STRL 7.0 (GLOVE) ×2 IMPLANT
GLOVE BIOGEL PI INDICATOR 7.0 (GLOVE) ×4
GOWN STRL REUS W/TWL LRG LVL3 (GOWN DISPOSABLE) ×6 IMPLANT
PACK VAGINAL MINOR WOMEN LF (CUSTOM PROCEDURE TRAY) ×3 IMPLANT
PAD OB MATERNITY 4.3X12.25 (PERSONAL CARE ITEMS) ×3 IMPLANT
SEAL ROD LENS SCOPE MYOSURE (ABLATOR) ×3 IMPLANT
TOWEL OR 17X24 6PK STRL BLUE (TOWEL DISPOSABLE) ×6 IMPLANT
TUBING AQUILEX INFLOW (TUBING) ×3 IMPLANT
TUBING AQUILEX OUTFLOW (TUBING) ×3 IMPLANT
WATER STERILE IRR 1000ML POUR (IV SOLUTION) ×3 IMPLANT

## 2016-01-04 NOTE — Discharge Instructions (Signed)
Hysteroscopy, Care After Refer to this sheet in the next few weeks. These instructions provide you with information on caring for yourself after your procedure. Your health care provider may also give you more specific instructions. Your treatment has been planned according to current medical practices, but problems sometimes occur. Call your health care provider if you have any problems or questions after your procedure.  WHAT TO EXPECT AFTER THE PROCEDURE After your procedure, it is typical to have the following:  You may have some cramping. This normally lasts for a couple days.  You may have bleeding. This can vary from light spotting for a few days to menstrual-like bleeding for 3-7 days. HOME CARE INSTRUCTIONS  Rest for the first 1-2 days after the procedure.  Only take over-the-counter or prescription medicines as directed by your health care provider. Do not take aspirin. It can increase the chances of bleeding.  Take showers instead of baths for 2 weeks or as directed by your health care provider.  Do not drive for 24 hours or as directed.  Do not drink alcohol while taking pain medicine.     Post Anesthesia Home Care Instructions  Activity: Get plenty of rest for the remainder of the day. A responsible adult should stay with you for 24 hours following the procedure.  For the next 24 hours, DO NOT: -Drive a car -Paediatric nurse -Drink alcoholic beverages -Take any medication unless instructed by your physician -Make any legal decisions or sign important papers.  Meals: Start with liquid foods such as gelatin or soup. Progress to regular foods as tolerated. Avoid greasy, spicy, heavy foods. If nausea and/or vomiting occur, drink only clear liquids until the nausea and/or vomiting subsides. Call your physician if vomiting continues.  Special Instructions/Symptoms: Your throat may feel dry or sore from the anesthesia or the breathing tube placed in your throat during  surgery. If this causes discomfort, gargle with warm salt water. The discomfort should disappear within 24 hours.  If you had a scopolamine patch placed behind your ear for the management of post- operative nausea and/or vomiting:  1. The medication in the patch is effective for 72 hours, after which it should be removed.  Wrap patch in a tissue and discard in the trash. Wash hands thoroughly with soap and water. 2. You may remove the patch earlier than 72 hours if you experience unpleasant side effects which may include dry mouth, dizziness or visual disturbances. 3. Avoid touching the patch. Wash your hands with soap and water after contact with the patch.     Do not use tampons, douche, or have sexual intercourse for 2 weeks or until your health care provider says it is okay.  Take your temperature twice a day for 4-5 days. Write it down each time.  Follow your health care provider's advice about diet, exercise, and lifting.  If you develop constipation, you may:  Take a mild laxative if your health care provider approves.  Add bran foods to your diet.  Drink enough fluids to keep your urine clear or pale yellow.  Try to have someone with you or available to you for the first 24-48 hours, especially if you were given a general anesthetic.  Follow up with your health care provider as directed. SEEK MEDICAL CARE IF:  You feel dizzy or lightheaded.  You feel sick to your stomach (nauseous).  You have abnormal vaginal discharge.  You have a rash.  You have pain that is not controlled with medicine.  SEEK IMMEDIATE MEDICAL CARE IF:  You have bleeding that is heavier than a normal menstrual period.  You have a fever.  You have increasing cramps or pain, not controlled with medicine.  You have new belly (abdominal) pain.  You pass out.  You have pain in the tops of your shoulders (shoulder strap areas).  You have shortness of breath.   This information is not intended  to replace advice given to you by your health care provider. Make sure you discuss any questions you have with your health care provider.   Document Released: 02/16/2013 Document Reviewed: 02/16/2013 Elsevier Interactive Patient Education 2016 Elsevier Inc[employer]  Dilation and Curettage or Vacuum Curettage, Care After Refer to this sheet in the next few weeks. These instructions provide you with information on caring for yourself after your procedure. Your health care provider may also give you more specific instructions. Your treatment has been planned according to current medical practices, but problems sometimes occur. Call your health care provider if you have any problems or questions after your procedure. WHAT TO EXPECT AFTER THE PROCEDURE After your procedure, it is typical to have light cramping and bleeding. This may last for 2 days to 2 weeks after the procedure. HOME CARE INSTRUCTIONS   Do not drive for 24 hours.  Wait 1 week before returning to strenuous activities.  Take your temperature 2 times a day for 4 days and write it down. Provide these temperatures to your health care provider if you develop a fever.  Avoid long periods of standing.  Avoid heavy lifting, pushing, or pulling. Do not lift anything heavier than 10 pounds (4.5 kg).  Limit stair climbing to once or twice a day.  Take rest periods often.  You may resume your usual diet.  Drink enough fluids to keep your urine clear or pale yellow.  Your usual bowel function should return. If you have constipation, you may:  Take a mild laxative with permission from your health care provider.  Add fruit and bran to your diet.  Drink more fluids.  Take showers instead of baths until your health care provider gives you permission to take baths.  Do not go swimming or use a hot tub until your health care provider approves.  Try to have someone with you or available to you the first 24-48 hours, especially if you were  given a general anesthetic.  Do not douche, use tampons, or have sex (intercourse) for 2 weeks after the procedure.  Only take over-the-counter or prescription medicines as directed by your health care provider. Do not take aspirin. It can cause bleeding.  Follow up with your health care provider as directed. SEEK MEDICAL CARE IF:   You have increasing cramps or pain that is not relieved with medicine.  You have abdominal pain that does not seem to be related to the same area of earlier cramping and pain.  You have bad smelling vaginal discharge.  You have a rash.  You are having problems with any medicine. SEEK IMMEDIATE MEDICAL CARE IF:   You have bleeding that is heavier than a normal menstrual period.  You have a fever.  You have chest pain.  You have shortness of breath.  You feel dizzy or feel like fainting.  You pass out.  You have pain in your shoulder strap area.  You have heavy vaginal bleeding with or without blood clots. MAKE SURE YOU:   Understand these instructions.  Will watch your condition.  Will get help  right away if you are not doing well or get worse.   This information is not intended to replace advice given to you by your health care provider. Make sure you discuss any questions you have with your health care provider.   Document Released: 04/25/2000 Document Revised: 05/03/2013 Document Reviewed: 11/25/2012 Elsevier Interactive Patient Education [employer].

## 2016-01-04 NOTE — Anesthesia Preprocedure Evaluation (Addendum)
Anesthesia Evaluation  Patient identified by MRN, date of birth, ID band Patient awake    Reviewed: Allergy & Precautions, NPO status , Patient's Chart, lab work & pertinent test results  Airway Mallampati: III  TM Distance: >3 FB Neck ROM: Full    Dental  (+) Teeth Intact, Dental Advisory Given   Pulmonary Current Smoker,    Pulmonary exam normal breath sounds clear to auscultation       Cardiovascular hypertension, Pt. on medications Normal cardiovascular exam Rhythm:Regular Rate:Normal     Neuro/Psych  Headaches, negative psych ROS   GI/Hepatic Neg liver ROS, hiatal hernia, GERD  Medicated,  Endo/Other  negative endocrine ROS  Renal/GU negative Renal ROS     Musculoskeletal  (+) Arthritis  (psoriatic),   Abdominal   Peds  Hematology  (+) Blood dyscrasia, anemia ,   Anesthesia Other Findings Day of surgery medications reviewed with the patient.  Reproductive/Obstetrics Menorrhagia                             Anesthesia Physical Anesthesia Plan  ASA: III  Anesthesia Plan: General   Post-op Pain Management:    Induction: Intravenous  Airway Management Planned: Oral ETT  Additional Equipment:   Intra-op Plan:   Post-operative Plan: Extubation in OR  Informed Consent: I have reviewed the patients History and Physical, chart, labs and discussed the procedure including the risks, benefits and alternatives for the proposed anesthesia with the patient or authorized representative who has indicated his/her understanding and acceptance.   Dental advisory given  Plan Discussed with: CRNA  Anesthesia Plan Comments: (Risks/benefits of general anesthesia discussed with patient including risk of damage to teeth, lips, gum, and tongue, nausea/vomiting, allergic reactions to medications, and the possibility of heart attack, stroke and death.  All patient questions answered.  Patient wishes  to proceed.)       Anesthesia Quick Evaluation

## 2016-01-04 NOTE — Progress Notes (Signed)
Update to History and Physical  Notes increased bleeding and cramping.   Ok to proceed with surgery.   Patient receiving Cefotetan due to taking Enbrel.

## 2016-01-04 NOTE — Anesthesia Postprocedure Evaluation (Signed)
Anesthesia Post Note  Patient: Dawn Hayes  Procedure(s) Performed: Procedure(s) (LRB): DILATATION & CURETTAGE/HYSTEROSCOPY (N/A)  Patient location during evaluation: PACU Anesthesia Type: General Level of consciousness: awake and alert Pain management: pain level controlled Vital Signs Assessment: post-procedure vital signs reviewed and stable Respiratory status: spontaneous breathing, nonlabored ventilation, respiratory function stable and patient connected to nasal cannula oxygen Cardiovascular status: blood pressure returned to baseline and stable Postop Assessment: no signs of nausea or vomiting Anesthetic complications: no     Last Vitals:  Vitals:   01/04/16 0908 01/04/16 0950  BP:  105/63  Pulse: 84 86  Resp: 20 20  Temp: 36.8 C     Last Pain:  Vitals:   01/04/16 0950  TempSrc:   PainSc: 2    Pain Goal: Patients Stated Pain Goal: 3 (01/04/16 0606)               Catalina Gravel

## 2016-01-04 NOTE — Op Note (Signed)
OPERATIVE REPORT   PREOPERATIVE DIAGNOSES:   Menometrorrhagia, thickened endometrium, possible endometrial mass.  POSTOPERATIVE DIAGNOSES:   Menometrorrhagia.  PROCEDURE:  Hysteroscopy with dilation and curettage.  SURGEON:  Lenard Galloway, MD  ANESTHESIA:  General anesthesia, paracervical block with 10 mL of 1% lidocaine.  IV FLUIDS:   ---  EBL:  10 cc.  URINE OUTPUT: None.  Had incontinence with coughing with anesthesia induction.   NORMAL SALINE DEFICIT:   250 cc.   COMPLICATIONS:  None.  INDICATIONS FOR THE PROCEDURE:     The patient is a 45 year old G60P0 Caucasian female who presents with menorrhagia and prolonged vaginal bleeding. Pelvic ultrasound revealed thickened endometrium measuring 17 mm.  There was a question of a possible endometrial mass.  Endometrial biopsy showed secretory endometrium.  A course of Provera did not significantly help the bleeding, and the patient is having increasing flow and cramping.  Hgb 10.5   A plan is now made to proceed with a hysteroscopy with dilation and curettage and possible Myosure resection of an endometrial mass after risks, benefits and alternatives were reviewed.  FINDINGS:  Exam under anesthesia revealed a small uterus with no adnexal masses. The uterus was sounded to 10 cm.  Hysteroscopy showed thickened irregular endometrium.  No specific polyp was identified.  No intracavitary fibroids were seen.  Both tubal ostial regions were visualized and were normal.  Endometrial currettings were moderate in quantity.   SPECIMENS:  Endometrial curettings were sent to Pathology.  PROCEDURE IN DETAIL:  The patient was reidentified in the preoperative hold area.  She did receive Cefotetan IV for antibiotic prophylaxis and she received TED hose and PAS stockings for DVT prophylaxis.  In the operating room, the patient was placed in the dorsal lithotomy position and then a general anesthetic was introduced.  The  patient's lower abdomen, vagina and perineum were sterilely prepped with Betadine and the  patient's bladder was catheterized of urine.  None was obtained.  She had previously  spontaneously voided with coughing during anesthesia induction. She was sterilly draped  A speculum was placed inside the vagina and a single-tooth tenaculum was placed on the anterior cervical lip.  A paracervical block was performed with a total of 10 mL of 1% lidocaine plain.  The uterus was sounded. The cervix was dilated to a #23 Pratt dilator.  The MyoSure hysteroscope was then inserted inside the uterine cavity under the continuous infusion of normal saline solution.  Findings are as noted above.  The MyoSure hysteroscope was removed.  The cervix was further dilated to a #25 Pratt dilator.  The sharp and then a serrated curette were introduced into the uterine cavity and the endometrium was curetted in all 4 quadrants.  A moderate amount of endometrial curettings was obtained.  This specimen was sent to Pathology.  The single-tooth tenaculum which had been placed on the anterior cervical lip was removed.  Hemostasis was good, and all of the vaginal instruments were removed.  The patient was awakened and escorted to the recovery room in stable condition after she was cleansed with Betadine.  There were no complications to the procedure.  All needle, instrument and sponge counts were correct.  Lenard Galloway, MD

## 2016-01-04 NOTE — Transfer of Care (Signed)
Immediate Anesthesia Transfer of Care Note  Patient: Dawn Hayes  Procedure(s) Performed: Procedure(s): DILATATION & CURETTAGE/HYSTEROSCOPY (N/A)  Patient Location: PACU  Anesthesia Type:General  Level of Consciousness: awake, alert , oriented and patient cooperative  Airway & Oxygen Therapy: Patient Spontanous Breathing and Patient connected to nasal cannula oxygen  Post-op Assessment: Report given to RN and Post -op Vital signs reviewed and stable  Post vital signs: Reviewed and stable  Last Vitals:  Vitals:   01/04/16 0606  BP: 122/81  Resp: 18  Temp: 36.7 C    Last Pain:  Vitals:   01/04/16 0606  TempSrc: Oral      Patients Stated Pain Goal: 3 ([id number])  Complications: No apparent anesthesia complications

## 2016-01-04 NOTE — Brief Op Note (Signed)
01/04/2016  7:58 AM  PATIENT:  Dawn Hayes  45 y.o. female  PRE-OPERATIVE DIAGNOSIS:  menometorrhagia, possible endometrial polyp, thickened endometrial lining   POST-OPERATIVE DIAGNOSIS:  menometorrhagia  PROCEDURE:  Procedure(s): DILATATION & CURETTAGE/HYSTEROSCOPY (N/A)  SURGEON:  Surgeon(s) and Role:    * Kyarah Enamorado E Yisroel Ramming, MD - Primary  PHYSICIAN ASSISTANT: NA  ASSISTANTS: none   ANESTHESIA:   general and paracervical block  EBL:  Total I/O In: -  Out: 10 [Blood:10] UO - none.  Had incontinence with coughing with anesthesia induction. NS deficit -  250 cc. BLOOD ADMINISTERED:none  DRAINS: none   LOCAL MEDICATIONS USED:  LIDOCAINE  and Amount: 10 ml  SPECIMEN:  Source of Specimen:  endometrial curettings  DISPOSITION OF SPECIMEN:  PATHOLOGY  COUNTS:  YES  TOURNIQUET:  * No tourniquets in log *  DICTATION: .Note written in EPIC  PLAN OF CARE: Discharge to home after PACU  PATIENT DISPOSITION:  PACU - hemodynamically stable.   Delay start of Pharmacological VTE agent (>24hrs) due to surgical blood loss or risk of bleeding: not applicable

## 2016-01-04 NOTE — Anesthesia Procedure Notes (Signed)
Procedure Name: Intubation Date/Time: 01/04/2016 7:24 AM Performed by: Raenette Rover Pre-anesthesia Checklist: Patient identified, Emergency Drugs available, Suction available and Patient being monitored Patient Re-evaluated:Patient Re-evaluated prior to inductionOxygen Delivery Method: Circle system utilized Preoxygenation: Pre-oxygenation with 100% oxygen Intubation Type: IV induction Ventilation: Mask ventilation without difficulty Laryngoscope Size: Mac and 3 Grade View: Grade I Tube type: Oral Tube size: 7.0 mm Number of attempts: 1 Airway Equipment and Method: Patient positioned with wedge pillow and Stylet Placement Confirmation: ETT inserted through vocal cords under direct vision,  positive ETCO2,  CO2 detector and breath sounds checked- equal and bilateral Secured at: 22 cm Tube secured with: Tape Dental Injury: Teeth and Oropharynx as per pre-operative assessment

## 2016-01-08 ENCOUNTER — Encounter (HOSPITAL_COMMUNITY): Payer: Self-pay | Admitting: Obstetrics and Gynecology

## 2016-01-08 ENCOUNTER — Encounter: Payer: Self-pay | Admitting: Family Medicine

## 2016-01-08 ENCOUNTER — Telehealth: Payer: Self-pay | Admitting: Obstetrics and Gynecology

## 2016-01-08 ENCOUNTER — Encounter: Payer: Self-pay | Admitting: Obstetrics and Gynecology

## 2016-01-08 NOTE — Telephone Encounter (Signed)
Telephone encounter created to discuss mychart message with patient. 

## 2016-01-08 NOTE — Telephone Encounter (Signed)
Spoke with patient. Advised of message and results as seen below from [place]. She is agreeable and verbalizes understanding. Patient had a D&C on 01/04/2016. Reports she is still having very heavy bleeding. Reports she is changing her overnight pad every 1-2 hours due to filling the pad. Denies any fatigue, SOB, light headedness, fever, or chills. Patient is having mild cramping that is relieved with taking Ibuprofen. "My bleeding is not slowing down." Advised I will speak with covering provider regarding recommendations and return call. Patient is agreeable and verbalizes understanding.  Notes Recorded by Nunzio Cobbs, MD on 01/07/2016 at 7:41 PM EDT Please inform patient of benign surgical pathology report showing secretory endometrium and benign endometrial polyp. I will see her for her post op visit in about 2 weeks. ------  Notes Recorded by Nunzio Cobbs, MD on 01/04/2016 at 3:34 PM EDT Results to patient through My Chart.  Hi Dawn Hayes,   You did great with surgery today!  Your blood type is A positive. I thought you might like this information.   Dawn Half, MD

## 2016-01-08 NOTE — Telephone Encounter (Signed)
Patient calling to get her results from her outpatient surgery.

## 2016-01-08 NOTE — Telephone Encounter (Signed)
Spoke with patient. Advised of message as seen below form Dr.Jertson. Advised patient will need to be seen in the office today for evaluation. Patient declines due to her schedule. Advised patient with such heavy bleeding it is not recommended to wait for appointment. Requests an appointment for tomorrow. Appointment scheduled for tomorrow 01/09/2016 at 9:30 am with Dr.Jertson. She is agreeable to date and time. Advised patient that if her bleeding increases further to having to change her pad every hour due to bleeding through for more than 2 hours, becomes light headed, dizzy, or SOB will need to be seen in the ER over night for evaluation. She is agreeable and verbalizes understanding.  Cc: Dr.Silva  Routing to provider for final review. Patient agreeable to disposition. Will close encounter.

## 2016-01-08 NOTE — Telephone Encounter (Signed)
If she is bleeding that heavily she should come in for evaluation

## 2016-01-09 ENCOUNTER — Encounter: Payer: Self-pay | Admitting: Obstetrics and Gynecology

## 2016-01-09 ENCOUNTER — Ambulatory Visit (INDEPENDENT_AMBULATORY_CARE_PROVIDER_SITE_OTHER): Payer: Commercial Managed Care - HMO | Admitting: Obstetrics and Gynecology

## 2016-01-09 ENCOUNTER — Telehealth: Payer: Self-pay

## 2016-01-09 ENCOUNTER — Ambulatory Visit: Payer: Commercial Managed Care - HMO | Admitting: Dietician

## 2016-01-09 VITALS — BP 122/70 | HR 80 | Resp 14 | Ht 61.5 in | Wt 265.0 lb

## 2016-01-09 DIAGNOSIS — R102 Pelvic and perineal pain: Secondary | ICD-10-CM | POA: Diagnosis not present

## 2016-01-09 DIAGNOSIS — N939 Abnormal uterine and vaginal bleeding, unspecified: Secondary | ICD-10-CM

## 2016-01-09 DIAGNOSIS — Z862 Personal history of diseases of the blood and blood-forming organs and certain disorders involving the immune mechanism: Secondary | ICD-10-CM | POA: Diagnosis not present

## 2016-01-09 DIAGNOSIS — Z9889 Other specified postprocedural states: Secondary | ICD-10-CM | POA: Diagnosis not present

## 2016-01-09 LAB — CBC WITH DIFFERENTIAL/PLATELET
BASOS ABS: 85 {cells}/uL (ref 0–200)
BASOS PCT: 1 %
Eosinophils Absolute: 85 cells/uL (ref 15–500)
Eosinophils Relative: 1 %
HCT: 31.2 % — ABNORMAL LOW (ref 35.0–45.0)
Hemoglobin: 10.1 g/dL — ABNORMAL LOW (ref 11.7–15.5)
LYMPHS ABS: 2210 {cells}/uL (ref 850–3900)
Lymphocytes Relative: 26 %
MCH: 28.1 pg (ref 27.0–33.0)
MCHC: 32.4 g/dL (ref 32.0–36.0)
MCV: 86.7 fL (ref 80.0–100.0)
MONO ABS: 680 {cells}/uL (ref 200–950)
MONOS PCT: 8 %
MPV: 9.6 fL (ref 7.5–12.5)
Neutro Abs: 5440 cells/uL (ref 1500–7800)
Neutrophils Relative %: 64 %
PLATELETS: 396 10*3/uL (ref 140–400)
RBC: 3.6 MIL/uL — ABNORMAL LOW (ref 3.80–5.10)
RDW: 16.7 % — ABNORMAL HIGH (ref 11.0–15.0)
WBC: 8.5 10*3/uL (ref 3.8–10.8)

## 2016-01-09 LAB — POCT URINALYSIS DIPSTICK
BILIRUBIN UA: NEGATIVE
Glucose, UA: NEGATIVE
Ketones, UA: NEGATIVE
NITRITE UA: NEGATIVE
PH UA: 6.5
PROTEIN UA: NEGATIVE
Urobilinogen, UA: NEGATIVE

## 2016-01-09 MED ORDER — MEDROXYPROGESTERONE ACETATE 10 MG PO TABS: 10.0000 mg | ORAL_TABLET | Freq: Every day | ORAL | 0 refills | Status: DC

## 2016-01-09 NOTE — Patient Instructions (Signed)
Verbal instructions given

## 2016-01-09 NOTE — Telephone Encounter (Signed)
Tanika with [facility] lab calling to report she will be faxing over STAT lab results from patient's visit today. I have advised Dr.Jertson lab results are available for review. Routing to [place] for review and advise.

## 2016-01-09 NOTE — Addendum Note (Signed)
Addended by: Susanne Greenhouse E on: 01/09/2016 03:03 PM   Modules accepted: Orders

## 2016-01-09 NOTE — Progress Notes (Signed)
GYNECOLOGY  VISIT   HPI: 45 y.o.   Married  Caucasian  female   G0P0 with Patient's last menstrual period was 11/19/2015.   here for heavy bleeding 5 days status post D&C for abnormal uterine bleeding. Pathology was benign. Using overnight pads and changing q 1-2 hours starting 1 day post op. Patient states bleeding has subsided today.  She has been having cramping since 1 week prior to surgery.  She had normal cycles up until May. She skipped June, then started bleeding July 8th and didn't stop prior to now.   GYNECOLOGIC HISTORY: Patient's last menstrual period was 11/19/2015. Contraception: abstinence, condoms if needed.  Menopausal hormone therapy: none.         OB History    Gravida Para Term Preterm AB Living   0             SAB TAB Ectopic Multiple Live Births                     Patient Active Problem List   Diagnosis Date Noted  . Costochondritis 12/04/2015  . Maxillary sinusitis, acute 12/04/2015  . Anemia, iron deficiency 10/10/2015  . Tick bites 10/10/2015  . Fatigue 10/10/2015  . Allergic rhinitis 09/07/2015  . Hemorrhoid   . Diverticulosis of colon without hemorrhage   . Hiatal hernia   . Anemia 05/08/2015  . Rectal bleeding 05/08/2015  . Chest pain, rule out acute myocardial infarction 09/27/2013  . Abnormal finding on EKG 09/16/2013  . Psoriasis 09/16/2013  . Psoriatic arthritis ([place]) 09/16/2013  . HTN (hypertension) 09/16/2013  . Morbid obesity- BMI 47 09/16/2013  . GERD 12/11/2009  . DIARRHEA, CHRONIC 12/11/2009    Past Medical History:  Diagnosis Date  . Anemia   . Arthritis   . Costochondritis   . Diverticulitis   . Dysmenorrhea   . GERD (gastroesophageal reflux disease)   . Headache    Migraines  . Hypertension   . Menorrhagia   . Psoriatic arthritis ([place])   . Smoker     Past Surgical History:  Procedure Laterality Date  . CARPAL TUNNEL RELEASE Left 01/19/2015   Procedure: LEFT CARPAL TUNNEL RELEASE;  Surgeon: Roseanne Kaufman, MD;   Location: [facility];  Service: Orthopedics;  Laterality: Left;  . CHOLECYSTECTOMY    . CHOLECYSTECTOMY, LAPAROSCOPIC  2006  . COLONOSCOPY  1998   normal, [place]  . COLONOSCOPY N/A 05/30/2015   RMR: Minimal anal canal hemorrhoids. colonic diverticulosis. I suspect relatively trivial recent gI Bleed likely related to hemorrhoids. This finding alone would not likely adequatly explain her degree of anemia. she likely has a significent component from menstrual losses. , etc.   . DILATATION & CURETTAGE/HYSTEROSCOPY WITH MYOSURE N/A 01/04/2016   Procedure: DILATATION & CURETTAGE/HYSTEROSCOPY;  Surgeon: Nunzio Cobbs, MD;  Location: [facility];  Service: Gynecology;  Laterality: N/A;  . ESOPHAGOGASTRODUODENOSCOPY  09/11/99   tiny esophageal erosions with mild erosive reflux/no barrett's/normal stomach  . ESOPHAGOGASTRODUODENOSCOPY N/A 05/30/2015   RMR: Hiatal hernia otherwise negative EGD    Current Outpatient Prescriptions  Medication Sig Dispense Refill  . clobetasol (TEMOVATE) 0.05 % GEL     . ENBREL SURECLICK 50 MG/ML injection Inject 50 mg as directed once a week. Wednesday    . esomeprazole (NEXIUM) 40 MG capsule Take 1 capsule (40 mg total) by mouth daily before breakfast. 30 capsule 11  . ferrous sulfate 325 (65 FE) MG tablet Take 1 tablet (325 mg total) by mouth  daily with breakfast. 30 tablet 6  . hydrochlorothiazide (HYDRODIURIL) 25 MG tablet Take 25 tablets by mouth daily.     Marland Kitchen ibuprofen (ADVIL,MOTRIN) 800 MG tablet Take 1 tablet (800 mg total) by mouth every 8 (eight) hours as needed. 30 tablet 0  . lisinopril (PRINIVIL,ZESTRIL) 10 MG tablet Take 10 mg by mouth daily.    Marland Kitchen OTEZLA 10 & 20 & 30 MG TBPK      No current facility-administered medications for this visit.      ALLERGIES: Adhesive [tape] and Aspirin  Family History  Problem Relation Age of Onset  . Brain cancer Father   . Prostate cancer Father   . Hyperlipidemia Mother   . Colon cancer  Maternal Grandmother 21  . Stroke Paternal Grandfather     COD  . Aneurysm Maternal Grandfather     Social History   Social History  . Marital status: Married    Spouse name: N/A  . Number of children: 0  . Years of education: N/A   Occupational History  . [employer]    Social History Main Topics  . Smoking status: Current Every Day Smoker    Packs/day: 0.10    Years: 20.00    Types: Cigarettes  . Smokeless tobacco: Never Used     Comment: smoking 5 cigarettes a day, hasn't quit completely   . Alcohol use 9.6 oz/week    6 Glasses of wine, 10 Standard drinks or equivalent per week     Comment: socially, weekends 4-5  . Drug use: No  . Sexual activity: Yes    Partners: Male    Birth control/ protection: None   Other Topics Concern  . Not on file   Social History Narrative  . No narrative on file    Review of Systems  Constitutional: Negative.   HENT: Negative.   Eyes: Negative.   Respiratory: Negative.   Cardiovascular: Negative.   Gastrointestinal: Negative.   Genitourinary:       Heavy vaginal bleeding   Musculoskeletal: Negative.   Skin: Negative.   Neurological: Negative.   Endo/Heme/Allergies: Negative.   Psychiatric/Behavioral: Negative.     PHYSICAL EXAMINATION:    BP 122/70 (BP Location: Right Arm, Patient Position: Sitting, Cuff Size: Large)   Pulse 80   Resp 14   Ht 5' 1.5" (1.562 m)   Wt 265 lb (120.2 kg)   LMP 11/19/2015   BMI 49.26 kg/m     General appearance: alert, cooperative and appears stated age Neck: no adenopathy, supple, symmetrical, trachea midline and thyroid normal to inspection and palpation Abdomen: soft, non-tender; bowel sounds normal; no masses,  no organomegaly  Pelvic: External genitalia:  no lesions              Urethra:  normal appearing urethra with no masses, tenderness or lesions              Bartholins and Skenes: normal                 Vagina: normal appearing vagina with normal color and  discharge, no lesions              Cervix: no lesions and no active bleeding, no CMT              Bimanual Exam:  Uterus:  anteverted, tender (patient states no change), not appreciably enlarged.               Adnexa: mild difuse tenderness  Tender with palpation of her bladder (no bladder symptoms)               Chaperone was present for exam.  ASSESSMENT 5 days s/p hysteroscopy, D&C with benign pathology, she was having very heavy bleeding until today, currently light Abdominal/pelvic cramping, tender on exam (she states no more tender than she was prior to surgery), pain is not worsening.  She had a normal WBC the day of surgery and did get antibiotics in the OR Anemia, on iron. I'm concerned it has worsened in the last few days    PLAN CBC with diff, will send it stat, if her WBC is elevated I will start her on antibiotics Provera 10 mg x 10 days F/U with Dr Quincy Simmonds next week Call with fevers, worsening or heavy bleeding Check urine dip, if + will send for ua, c&s   An After Visit Summary was printed and given to the patient.  20 minutes face to face time of which over 50% was spent in counseling.

## 2016-01-11 ENCOUNTER — Other Ambulatory Visit: Payer: Self-pay | Admitting: Obstetrics and Gynecology

## 2016-01-11 ENCOUNTER — Telehealth: Payer: Self-pay

## 2016-01-11 LAB — URINE CULTURE

## 2016-01-11 NOTE — Telephone Encounter (Signed)
Spoke with patient. Advised of message and results as seen below from [place]. Patient denies any urinary symptoms, fever, or chills. Advised patient will need to return for clean catch ua. Patient is agreeable. Reports she was advised by Dr.Jertson that she would need to return to the office next week for follow up with Dr.Silva. She would like to combine these appointments together. Appointment scheduled for 01/18/2016 at 3:30 pm with Dr.Silva. She is agreeable to date and time. Aware she will need to contact the office if she develops any urinary symptoms. She is agreeable and verbalizes understanding.  Routing to provider for final review. Patient agreeable to disposition. Will close encounter.

## 2016-01-11 NOTE — Telephone Encounter (Signed)
-----   Message from Salvadore Dom, MD sent at 01/11/2016 12:30 PM EDT ----- Please check with the patient, I suspect this is a contaminant. She wasn't c/o urinary symptoms, but her bladder was tender. If she is having any symptoms, then treat with keflex 500 mg q 12 hours x 7 days. Otherwise have her come back for another clean catch ua.

## 2016-01-15 ENCOUNTER — Telehealth: Payer: Self-pay | Admitting: Obstetrics and Gynecology

## 2016-01-15 NOTE — Telephone Encounter (Signed)
I have reviewed the patient contact with the office and her visit last week with Dr. Talbert Nan in my absence.  I have closed the encounter.

## 2016-01-15 NOTE — Telephone Encounter (Signed)
Patient called and cancelled her 2 week post op appointment with Dr. Quincy Simmonds on 01/21/16. She said, "I don't need that appointment because I am seeing her on Friday, 01/18/16 instead."  Routing to Dr. Quincy Simmonds for [facility].

## 2016-01-16 ENCOUNTER — Other Ambulatory Visit: Payer: Self-pay | Admitting: Obstetrics and Gynecology

## 2016-01-16 NOTE — Telephone Encounter (Signed)
Medication refill request: Medroxypregesterone Last AEX:  12/19/15 BS Next AEX: 01/18/16 BS Last MMG (if hormonal medication request): BIRADS1, Density A, Neg Refill authorized: 01/09/16. #10 0R. Please advise.

## 2016-01-18 ENCOUNTER — Ambulatory Visit: Payer: Commercial Managed Care - HMO | Admitting: Obstetrics and Gynecology

## 2016-01-18 ENCOUNTER — Telehealth: Payer: Self-pay | Admitting: Obstetrics and Gynecology

## 2016-01-18 NOTE — Telephone Encounter (Signed)
Patient had an appointment today for "1 week recheck, will need clean catch ua per JJ". Patient said she could not wait any longer and would call to reschedule.

## 2016-01-20 NOTE — Telephone Encounter (Signed)
Please call patient to reschedule her post op visit with me preferably this week. She has had bleeding post dilation and curettage.  She may bleed again when she runs out of the Provera.   She also needs a urine evaluation at the time of her office visit.

## 2016-01-21 ENCOUNTER — Ambulatory Visit: Payer: Commercial Managed Care - HMO | Admitting: Obstetrics and Gynecology

## 2016-01-21 NOTE — Telephone Encounter (Signed)
I instructed her to call if she experienced heavy bleeding prior to appointment. Encounter closed.

## 2016-01-21 NOTE — Telephone Encounter (Signed)
[place]oyd for this date for appointment.  She can call if she has recurrent bleeding.

## 2016-01-21 NOTE — Telephone Encounter (Signed)
Call to patient regarding rescheduling post-op. Patient states she simply cannot come back this week.  Advised of Dr Elza Rafter concern for bleeding to restart if runs out of Provera. Patient states she has taken the last of her Provera but she cannot reschedule until next week. Appointment scheduled for 01-28-16 as first date patient is available.   Routing to provider for final review.

## 2016-01-25 ENCOUNTER — Other Ambulatory Visit: Payer: Self-pay | Admitting: Obstetrics and Gynecology

## 2016-01-25 NOTE — Telephone Encounter (Signed)
Medication refill request: Medroxyprogesterone (Provera) Last AEX:  12/19/15 BS Next AEX: 01/28/16 BS Last MMG (if hormonal medication request): 06/19/14 BIRADS1, Density A, [facility] Refill authorized: 01/16/16 #10 0R. Please advise. Thank you.

## 2016-01-28 ENCOUNTER — Ambulatory Visit (INDEPENDENT_AMBULATORY_CARE_PROVIDER_SITE_OTHER): Payer: Commercial Managed Care - HMO | Admitting: Obstetrics and Gynecology

## 2016-01-28 ENCOUNTER — Encounter: Payer: Self-pay | Admitting: Obstetrics and Gynecology

## 2016-01-28 VITALS — BP 112/70 | HR 84 | Ht 62.0 in | Wt 272.6 lb

## 2016-01-28 DIAGNOSIS — N921 Excessive and frequent menstruation with irregular cycle: Secondary | ICD-10-CM | POA: Diagnosis not present

## 2016-01-28 DIAGNOSIS — D509 Iron deficiency anemia, unspecified: Secondary | ICD-10-CM | POA: Diagnosis not present

## 2016-01-28 LAB — CBC
HCT: 31.4 % — ABNORMAL LOW (ref 35.0–45.0)
HEMOGLOBIN: 10 g/dL — AB (ref 11.7–15.5)
MCH: 27.5 pg (ref 27.0–33.0)
MCHC: 31.8 g/dL — ABNORMAL LOW (ref 32.0–36.0)
MCV: 86.3 fL (ref 80.0–100.0)
MPV: 9.7 fL (ref 7.5–12.5)
PLATELETS: 333 10*3/uL (ref 140–400)
RBC: 3.64 MIL/uL — AB (ref 3.80–5.10)
RDW: 15.9 % — ABNORMAL HIGH (ref 11.0–15.0)
WBC: 6.9 10*3/uL (ref 3.8–10.8)

## 2016-01-28 NOTE — Progress Notes (Signed)
GYNECOLOGY  VISIT   HPI: 45 y.o.   Married  Caucasian  female   G0P0 with Patient's last menstrual period was 11/19/2015.   here for follow up   DILATATION & CURETTAGE/HYSTEROSCOPY (N/A ).  Last time she had bleeding was the week after her procedure, 01/09/16.  Was treated with a course of Provera 10 mg x 10 days.  No current bleeding.   Hgb 10.5 on 01/04/16.  Report she has chronic anemia and low iron/ferritin.   States she has always had heavy and painful menses.  PCP - Dr. Birdie Riddle in [place].  GYNECOLOGIC HISTORY: Patient's last menstrual period was 11/19/2015. Contraception:  Abstinence/condoms if needed Menopausal hormone therapy:  none Last mammogram:  06-21-14 Density A/Neg/BiRads1:[facility] Last pap smear:   12-27-12 Neg:Neg HR HPV        OB History    Gravida Para Term Preterm AB Living   0             SAB TAB Ectopic Multiple Live Births                     Patient Active Problem List   Diagnosis Date Noted  . Costochondritis 12/04/2015  . Maxillary sinusitis, acute 12/04/2015  . Anemia, iron deficiency 10/10/2015  . Tick bites 10/10/2015  . Fatigue 10/10/2015  . Allergic rhinitis 09/07/2015  . Hemorrhoid   . Diverticulosis of colon without hemorrhage   . Hiatal hernia   . Anemia 05/08/2015  . Rectal bleeding 05/08/2015  . Chest pain, rule out acute myocardial infarction 09/27/2013  . Abnormal finding on EKG 09/16/2013  . Psoriasis 09/16/2013  . Psoriatic arthritis ([place]) 09/16/2013  . HTN (hypertension) 09/16/2013  . Morbid obesity- BMI 47 09/16/2013  . GERD 12/11/2009  . DIARRHEA, CHRONIC 12/11/2009    Past Medical History:  Diagnosis Date  . Anemia   . Arthritis   . Costochondritis   . Diverticulitis   . Dysmenorrhea   . GERD (gastroesophageal reflux disease)   . Headache    Migraines  . Hypertension   . Menorrhagia   . Psoriatic arthritis ([place])   . Smoker     Past Surgical History:  Procedure Laterality Date  . CARPAL  TUNNEL RELEASE Left 01/19/2015   Procedure: LEFT CARPAL TUNNEL RELEASE;  Surgeon: Roseanne Kaufman, MD;  Location: [facility];  Service: Orthopedics;  Laterality: Left;  . CHOLECYSTECTOMY    . CHOLECYSTECTOMY, LAPAROSCOPIC  2006  . COLONOSCOPY  1998   normal, [place]  . COLONOSCOPY N/A 05/30/2015   RMR: Minimal anal canal hemorrhoids. colonic diverticulosis. I suspect relatively trivial recent gI Bleed likely related to hemorrhoids. This finding alone would not likely adequatly explain her degree of anemia. she likely has a significent component from menstrual losses. , etc.   . DILATATION & CURETTAGE/HYSTEROSCOPY WITH MYOSURE N/A 01/04/2016   Procedure: DILATATION & CURETTAGE/HYSTEROSCOPY;  Surgeon: Nunzio Cobbs, MD;  Location: [facility];  Service: Gynecology;  Laterality: N/A;  . ESOPHAGOGASTRODUODENOSCOPY  09/11/99   tiny esophageal erosions with mild erosive reflux/no barrett's/normal stomach  . ESOPHAGOGASTRODUODENOSCOPY N/A 05/30/2015   RMR: Hiatal hernia otherwise negative EGD    Current Outpatient Prescriptions  Medication Sig Dispense Refill  . clobetasol (TEMOVATE) 0.05 % GEL     . ENBREL SURECLICK 50 MG/ML injection Inject 50 mg as directed once a week. Wednesday    . esomeprazole (NEXIUM) 40 MG capsule Take 1 capsule (40 mg total) by mouth daily before breakfast.  30 capsule 11  . ferrous sulfate 325 (65 FE) MG tablet Take 1 tablet (325 mg total) by mouth daily with breakfast. 30 tablet 6  . hydrochlorothiazide (HYDRODIURIL) 25 MG tablet Take 25 tablets by mouth daily.     Marland Kitchen ibuprofen (ADVIL,MOTRIN) 800 MG tablet Take 1 tablet (800 mg total) by mouth every 8 (eight) hours as needed. 30 tablet 0  . lisinopril (PRINIVIL,ZESTRIL) 10 MG tablet Take 10 mg by mouth daily.    . medroxyPROGESTERone (PROVERA) 10 MG tablet TAKE 1 TABLET(10 MG) BY MOUTH DAILY 10 tablet 0  . OTEZLA 10 & 20 & 30 MG TBPK      No current facility-administered medications for this visit.       ALLERGIES: Adhesive [tape] and Aspirin  Family History  Problem Relation Age of Onset  . Brain cancer Father   . Prostate cancer Father   . Hyperlipidemia Mother   . Colon cancer Maternal Grandmother 31  . Stroke Paternal Grandfather     COD  . Aneurysm Maternal Grandfather     Social History   Social History  . Marital status: Married    Spouse name: N/A  . Number of children: 0  . Years of education: N/A   Occupational History  . [employer]    Social History Main Topics  . Smoking status: Current Every Day Smoker    Packs/day: 0.10    Years: 20.00    Types: Cigarettes  . Smokeless tobacco: Never Used     Comment: smoking 5 cigarettes a day, hasn't quit completely   . Alcohol use 9.6 oz/week    6 Glasses of wine, 10 Standard drinks or equivalent per week     Comment: socially, weekends 4-5  . Drug use: No  . Sexual activity: Yes    Partners: Male    Birth control/ protection: None   Other Topics Concern  . Not on file   Social History Narrative  . No narrative on file    ROS:  Pertinent items are noted in HPI.  PHYSICAL EXAMINATION:    BP 112/70 (BP Location: Right Arm, Patient Position: Sitting, Cuff Size: Large)   Pulse 84   Ht 5\' 2"  (1.575 m)   Wt 272 lb 9.6 oz (123.7 kg)   LMP 11/19/2015   BMI 49.86 kg/m     General appearance: alert, cooperative and appears stated age  Pelvic: External genitalia:  no lesions              Urethra:  normal appearing urethra with no masses, tenderness or lesions              Bartholins and Skenes: normal                 Vagina: normal appearing vagina with normal color and discharge, no lesions              Cervix: no lesions                Bimanual Exam:  Uterus:  normal size, contour, position, consistency, mobility, non-tender              Adnexa: no mass, fullness, tenderness             Chaperone was present for exam.  ASSESSMENT  Menometrorrhagia.  Chronic iron deficiency  anemia.  PLAN  We discussed her surgical findings, procedure, and pathology report. We reviewed options for ongoing tx of heavy and irregular cycles -  Micronor, Nexplanon, Mirena IUD, endometrial ablation, hysterectomy. We did discuss the benefit of tx with progesterone option to prevent hyperplasia/malignancy. Declines tx with "more pills" or IUD. CBC, iron and ferritin today. Keep bleeding calendar.  She will call for bleeding irregularity or heavy cycles.  She will call to get her mammogram done.  Return for a recheck and pap in 3 months.    An After Visit Summary was printed and given to the patient.  __15____ minutes face to face time of which over 50% was spent in counseling.

## 2016-01-29 LAB — FERRITIN: Ferritin: 7 ng/mL — ABNORMAL LOW (ref 10–232)

## 2016-01-29 LAB — IRON: IRON: 17 ug/dL — AB (ref 40–190)

## 2016-01-30 ENCOUNTER — Telehealth: Payer: Self-pay | Admitting: Obstetrics and Gynecology

## 2016-01-30 NOTE — Telephone Encounter (Signed)
Patient calling to schedule an iron infusion.

## 2016-01-30 NOTE — Telephone Encounter (Signed)
Spoke with patient. Patient states she is ready to schedule Iron Infusion. Advised patient office would begin that process for her. Patient states Mondays and Tuesdays after lunch work best for her. Advised patient Infusion done at [facility], will follow-up with appointment information. Patient is agreeable.  CC: Lerry Liner

## 2016-02-01 ENCOUNTER — Telehealth: Payer: Self-pay | Admitting: Oncology

## 2016-02-01 ENCOUNTER — Other Ambulatory Visit: Payer: Self-pay | Admitting: Obstetrics and Gynecology

## 2016-02-01 DIAGNOSIS — D509 Iron deficiency anemia, unspecified: Secondary | ICD-10-CM

## 2016-02-01 NOTE — Telephone Encounter (Signed)
Spoke with patient. Advised of message as seen below from [place]. Patient is agreeable and verbalizes understanding. Advised patient that [facility] will contact her directly to set up her appointment. Patient is agreeable. Advised if she has not heard from their facility by 02/05/2016 to contact our office to follow up. Patient is agreeable and verbalizes understanding.  Cc: Theresia Lo regarding referral  Routing to provider for final review. Patient agreeable to disposition. Will close encounter.

## 2016-02-01 NOTE — Telephone Encounter (Signed)
Pt confirmed appt, wants to contact [facility] to see if she can be seen sooner then 10/10. Pt will call back to confirm appt., completed intake

## 2016-02-01 NOTE — Telephone Encounter (Signed)
This is going to be best accomplished through oncology at [facility].  I will place a referral. Please let the patient know.

## 2016-02-02 ENCOUNTER — Other Ambulatory Visit: Payer: Self-pay | Admitting: Obstetrics and Gynecology

## 2016-02-04 NOTE — Telephone Encounter (Signed)
Medication refill request: Provera  Last AEX:  12-19-15 Next AEX: not scheduled  Last MMG (if hormonal medication request): 06-21-14 WNL  Refill authorized: please advise

## 2016-02-06 ENCOUNTER — Encounter: Payer: Self-pay | Admitting: Skilled Nursing Facility1

## 2016-02-06 ENCOUNTER — Encounter: Payer: Commercial Managed Care - HMO | Attending: Family Medicine | Admitting: Skilled Nursing Facility1

## 2016-02-06 DIAGNOSIS — Z6841 Body Mass Index (BMI) 40.0 and over, adult: Secondary | ICD-10-CM | POA: Insufficient documentation

## 2016-02-06 DIAGNOSIS — E669 Obesity, unspecified: Secondary | ICD-10-CM | POA: Insufficient documentation

## 2016-02-06 DIAGNOSIS — M1990 Unspecified osteoarthritis, unspecified site: Secondary | ICD-10-CM | POA: Insufficient documentation

## 2016-02-06 DIAGNOSIS — Z713 Dietary counseling and surveillance: Secondary | ICD-10-CM | POA: Diagnosis not present

## 2016-02-06 NOTE — Patient Instructions (Addendum)
-  Eat 3 meals a day and snacks in between (if you are hungry for the snacks) -A meal: carbohydrate, protein, vegetable -A snack: A Fruit OR Vegetable AND Protein -Honor your body by listening to your hunger and fullness cues -First thought: Am I hungry? -Second thought: (if the answer is yes I am hungry) Does this meal have vegetables? Protein? Carbohydrate?  -Third thought: Are there more vegetables on my plate compared to Protein and Carbohydrates? -After you have finished your first serving Do Not go back for more until you have waited 20-30 minutes and checked in with your body by asking Am I Still Hungry?   Breakfast idea: greek yogurt, graola,  Fruit OR oatmeal with banana and nuts  Snack: cheese and apple  Lunch: carrots and celery, [place] sandwich  Snack: vegetables and hummus  Dinner: beets, grilled chicken, potatoes

## 2016-02-06 NOTE — Progress Notes (Signed)
  Medical Nutrition Therapy:  Appt start time: 4:27 end time:  5:00   Assessment:  Primary concerns today: referred for obesity. Pt states when she was diagnosis with arthritis she has gained most of her wt.  Pt states she tried to lose wt through several different types of diets with minimal success. Pt states her sleep is okay. Pt states her usual wt as an adult is her current wt.   Preferred Learning Style:   No preference indicated   Learning Readiness:   Contemplating  MEDICATIONS: See List   DIETARY INTAKE:  Usual eating pattern includes 3 meals and 2 snacks per day.  Everyday foods include none stated.  Avoided foods include none stated.    24-hr recall:  B ( AM): fruit or cheese----omelet  Snk ( AM):  chips L ( PM): eating out----sandwiches Snk ( PM): chips D ( PM): ham and cheese rollups---grilled meat and vegetables---pasta Snk ( PM):  Beverages: mixed drink vodka crandberry, water  Usual physical activity: DVDs 2-4 mile walk 2-3 days a week, beginners yoga  Estimated energy needs: 1600 calories 180 g carbohydrates 120 g protein 44 g fat  Progress Towards Goal(s):  In progress.   Nutritional Diagnosis:  [place]-3.3 Overweight/obesity As related to overconsumption .  As evidenced by pt report, 24 hr recall, BMI 49.78.    Intervention:  Nutrition counseling for obesity. Dietitian educated the pt on listening to her hunger and fullness cues, meal balance, and the importance of physical activity. Goals: -Eat 3 meals a day and snacks in between (if you are hungry for the snacks) -A meal: carbohydrate, protein, vegetable -A snack: A Fruit OR Vegetable AND Protein -Honor your body by listening to your hunger and fullness cues -First thought: Am I hungry? -Second thought: (if the answer is yes I am hungry) Does this meal have vegetables? Protein? Carbohydrate?  -Third thought: Are there more vegetables on my plate compared to Protein and Carbohydrates? -After you have  finished your first serving Do Not go back for more until you have waited 20-30 minutes and checked in with your body by asking Am I Still Hungry?   Breakfast idea: greek yogurt, graola,  Fruit OR oatmeal with banana and nuts  Snack: cheese and apple  Lunch: carrots and celery, [place] sandwich  Snack: vegetables and hummus  Dinner: beets, grilled chicken, potatoes   Teaching Method Utilized:  Visual Auditory Hands on  Handouts given during visit include:  myplate  Barriers to learning/adherence to lifestyle change: none stated  Demonstrated degree of understanding via:  Teach Back   Monitoring/Evaluation:  Dietary intake, exercise, and body weight prn.

## 2016-02-08 ENCOUNTER — Other Ambulatory Visit: Payer: Self-pay | Admitting: Gastroenterology

## 2016-02-12 ENCOUNTER — Other Ambulatory Visit: Payer: Self-pay | Admitting: Family Medicine

## 2016-02-19 ENCOUNTER — Ambulatory Visit (HOSPITAL_BASED_OUTPATIENT_CLINIC_OR_DEPARTMENT_OTHER): Payer: Commercial Managed Care - HMO | Admitting: Oncology

## 2016-02-19 ENCOUNTER — Ambulatory Visit (HOSPITAL_BASED_OUTPATIENT_CLINIC_OR_DEPARTMENT_OTHER): Payer: Commercial Managed Care - HMO

## 2016-02-19 ENCOUNTER — Telehealth: Payer: Self-pay | Admitting: *Deleted

## 2016-02-19 ENCOUNTER — Telehealth: Payer: Self-pay | Admitting: Oncology

## 2016-02-19 VITALS — BP 116/81 | HR 99 | Temp 97.9°F | Resp 18 | Ht 61.5 in | Wt 270.2 lb

## 2016-02-19 DIAGNOSIS — R110 Nausea: Secondary | ICD-10-CM | POA: Diagnosis not present

## 2016-02-19 DIAGNOSIS — R109 Unspecified abdominal pain: Secondary | ICD-10-CM | POA: Diagnosis not present

## 2016-02-19 DIAGNOSIS — N946 Dysmenorrhea, unspecified: Secondary | ICD-10-CM

## 2016-02-19 DIAGNOSIS — D509 Iron deficiency anemia, unspecified: Secondary | ICD-10-CM | POA: Diagnosis not present

## 2016-02-19 LAB — CBC WITH DIFFERENTIAL/PLATELET
BASO%: 0.2 % (ref 0.0–2.0)
Basophils Absolute: 0 10*3/uL (ref 0.0–0.1)
EOS%: 1.5 % (ref 0.0–7.0)
Eosinophils Absolute: 0.1 10*3/uL (ref 0.0–0.5)
HCT: 32.2 % — ABNORMAL LOW (ref 34.8–46.6)
HEMOGLOBIN: 10.2 g/dL — AB (ref 11.6–15.9)
LYMPH#: 1.8 10*3/uL (ref 0.9–3.3)
LYMPH%: 20.2 % (ref 14.0–49.7)
MCH: 26.8 pg (ref 25.1–34.0)
MCHC: 31.7 g/dL (ref 31.5–36.0)
MCV: 84.7 fL (ref 79.5–101.0)
MONO#: 0.5 10*3/uL (ref 0.1–0.9)
MONO%: 6 % (ref 0.0–14.0)
NEUT#: 6.3 10*3/uL (ref 1.5–6.5)
NEUT%: 72.1 % (ref 38.4–76.8)
Platelets: 335 10*3/uL (ref 145–400)
RBC: 3.8 10*6/uL (ref 3.70–5.45)
RDW: 15.1 % — AB (ref 11.2–14.5)
WBC: 8.7 10*3/uL (ref 3.9–10.3)

## 2016-02-19 LAB — IRON AND TIBC
%SAT: 6 % — ABNORMAL LOW (ref 21–57)
IRON: 25 ug/dL — AB (ref 41–142)
TIBC: 443 ug/dL (ref 236–444)
UIBC: 418 ug/dL — ABNORMAL HIGH (ref 120–384)

## 2016-02-19 LAB — FERRITIN: FERRITIN: 6 ng/mL — AB (ref 9–269)

## 2016-02-19 NOTE — Telephone Encounter (Signed)
Message sent to infusion scheduler to be added. Lab added on for today. Avs report and appointment schedule given to patient, per 02/19/16 los.

## 2016-02-19 NOTE — Progress Notes (Signed)
Reason for Referral: Iron deficiency anemia.   HPI: 45 year old woman with history of psoriatic arthritis, obesity and dysmenorrhea referred to me for evaluation and treatment of iron deficiency anemia. She reports that she has been diagnosed with iron deficiency and the last year and have been taking oral iron replacement on a daily basis. She reports that she does not miss any doses but have reported issues associated with oral iron. She reports abdominal pain, dyspepsia and occasional nausea associated with it. She has not reported any vomiting, or constipation. She did have a GI workup including a colonoscopy and endoscopy which did not show any active bleeding. Her most recent CBC done on 01/28/2016 showed a hemoglobin of 10 white cell count of 6.9 and platelet count of 333. Her MCV was 86 and her RDW is elevated at 15.9. Her iron level was 17 with ferritin of 7. Her previous iron studies in May 2017 showed iron level was 31 and ferritin was at 7.7 with saturation of 6%. Her iron deficiency dates back to at least December 2016 where her ferritin has fluctuated between 7 and 13.  She is symptomatic with symptoms of mild fatigue and tiredness. He denies any chest pain or shortness of breath. She denied any hematochezia or melena. She denies any dyspnea on exertion. She does not report any headaches, blurry vision, syncope or seizures. She does not report any fevers or chills or sweats. She does not report any chest pain, palpitation orthopnea. She does not report any dyspepsia, constipation or diarrhea. He does not report any hematochezia or melena. She does report regular menstrual cycles with occasional heavy positive it earlier on. She does not report any skeletal complaints. Remaining review of systems unremarkable.   Past Medical History:  Diagnosis Date  . Anemia   . Arthritis   . Costochondritis   . Diverticulitis   . Dysmenorrhea   . GERD (gastroesophageal reflux disease)   . Headache     Migraines  . Hypertension   . Menorrhagia   . Psoriatic arthritis ([place])   . Smoker   :  Past Surgical History:  Procedure Laterality Date  . CARPAL TUNNEL RELEASE Left 01/19/2015   Procedure: LEFT CARPAL TUNNEL RELEASE;  Surgeon: Roseanne Kaufman, MD;  Location: [facility];  Service: Orthopedics;  Laterality: Left;  . CHOLECYSTECTOMY    . CHOLECYSTECTOMY, LAPAROSCOPIC  2006  . COLONOSCOPY  1998   normal, [place]  . COLONOSCOPY N/A 05/30/2015   RMR: Minimal anal canal hemorrhoids. colonic diverticulosis. I suspect relatively trivial recent gI Bleed likely related to hemorrhoids. This finding alone would not likely adequatly explain her degree of anemia. she likely has a significent component from menstrual losses. , etc.   . DILATATION & CURETTAGE/HYSTEROSCOPY WITH MYOSURE N/A 01/04/2016   Procedure: DILATATION & CURETTAGE/HYSTEROSCOPY;  Surgeon: Nunzio Cobbs, MD;  Location: [facility];  Service: Gynecology;  Laterality: N/A;  . ESOPHAGOGASTRODUODENOSCOPY  09/11/99   tiny esophageal erosions with mild erosive reflux/no barrett's/normal stomach  . ESOPHAGOGASTRODUODENOSCOPY N/A 05/30/2015   RMR: Hiatal hernia otherwise negative EGD  :   Current Outpatient Prescriptions:  .  clobetasol (TEMOVATE) 0.05 % GEL, , Disp: , Rfl:  .  ENBREL SURECLICK 50 MG/ML injection, Inject 50 mg as directed once a week. Wednesday, Disp: , Rfl:  .  esomeprazole (NEXIUM) 40 MG capsule, TAKE 1 CAPSULE(40 MG) BY MOUTH DAILY BEFORE BREAKFAST, Disp: 30 capsule, Rfl: 5 .  ferrous sulfate 325 (65 FE) MG tablet, Take 1  tablet (325 mg total) by mouth daily with breakfast., Disp: 30 tablet, Rfl: 6 .  hydrochlorothiazide (HYDRODIURIL) 25 MG tablet, TAKE 1 TABLET BY MOUTH EVERY DAY, Disp: 30 tablet, Rfl: 3 .  ibuprofen (ADVIL,MOTRIN) 800 MG tablet, Take 1 tablet (800 mg total) by mouth every 8 (eight) hours as needed., Disp: 30 tablet, Rfl: 0 .  lisinopril (PRINIVIL,ZESTRIL) 10 MG tablet, TAKE 1 TABLET  BY MOUTH EVERY DAY, Disp: 30 tablet, Rfl: 3 .  medroxyPROGESTERone (PROVERA) 10 MG tablet, TAKE 1 TABLET(10 MG) BY MOUTH DAILY, Disp: 10 tablet, Rfl: 0:  Allergies  Allergen Reactions  . Adhesive [Tape] Other (See Comments)    Hard to remove, due to psoriatic arthritis  . Aspirin Nausea Only  :  Family History  Problem Relation Age of Onset  . Brain cancer Father   . Prostate cancer Father   . Hyperlipidemia Mother   . Colon cancer Maternal Grandmother 66  . Stroke Paternal Grandfather     COD  . Aneurysm Maternal Grandfather   :  Social History   Social History  . Marital status: Married    Spouse name: N/A  . Number of children: 0  . Years of education: N/A   Occupational History  . [employer]    Social History Main Topics  . Smoking status: Current Every Day Smoker    Packs/day: 0.10    Years: 20.00    Types: Cigarettes  . Smokeless tobacco: Never Used     Comment: smoking 5 cigarettes a day, hasn't quit completely   . Alcohol use 9.6 oz/week    6 Glasses of wine, 10 Standard drinks or equivalent per week     Comment: socially, weekends 4-5  . Drug use: No  . Sexual activity: Yes    Partners: Male    Birth control/ protection: None   Other Topics Concern  . Not on file   Social History Narrative  . No narrative on file  :  Pertinent items are noted in HPI.  Exam: Blood pressure 116/81, pulse 99, temperature 97.9 F (36.6 C), temperature source Oral, resp. rate 18, height 5' 1.5" (1.562 m), weight 270 lb 3.2 oz (122.6 kg), SpO2 98 %. General appearance: alert and cooperative Head: Normocephalic, without obvious abnormality Throat: lips, mucosa, and tongue normal; teeth and gums normal Neck: no adenopathy Back: negative Resp: clear to auscultation bilaterally Chest wall: no tenderness Cardio: regular rate and rhythm, S1, S2 normal, no murmur, click, rub or gallop GI: soft, non-tender; bowel sounds normal; no masses,  no  organomegaly Extremities: extremities normal, atraumatic, no cyanosis or edema Pulses: 2+ and symmetric  CBC    Component Value Date/Time   WBC 6.9 01/28/2016 1642   RBC 3.64 (L) 01/28/2016 1642   HGB 10.0 (L) 01/28/2016 1642   HGB 12.3 12/27/2012 1708   HCT 31.4 (L) 01/28/2016 1642   PLT 333 01/28/2016 1642   MCV 86.3 01/28/2016 1642   MCH 27.5 01/28/2016 1642   MCHC 31.8 (L) 01/28/2016 1642   RDW 15.9 (H) 01/28/2016 1642   LYMPHSABS 2,210 01/09/2016 1026   MONOABS 680 01/09/2016 1026   EOSABS 85 01/09/2016 1026   BASOSABS 85 01/09/2016 1026   Results for CENIA, FURROW (MRN [id number]) as of 02/19/2016 13:23  Ref. Range 10/10/2015 11:58 10/10/2015 11:58 01/28/2016 16:42  Iron Latest Ref Range: 40 - 190 ug/dL 31 (L) 31 (L) 17 (L)  Saturation Ratios Latest Ref Range: 20.0 - 50.0 % 6.2 (L)  Ferritin Latest Ref Range: 10 - 232 ng/mL 7.7 (L)  7 (L)  Transferrin Latest Ref Range: 212.0 - 360.0 mg/dL 358.0      Assessment and Plan:   45 year old woman with the following issues:  1. Iron deficiency anemia documented back in December 2016. At that time her iron level was 20 and ferritin of 7. Despite oral iron replacement her hemoglobin continues to be low and iron levels have not improved.  Options of therapy was discussed today including increasing her oral iron replacement therapy to twice or 3 times a day also avoiding antiacids. In with vitamin C should also help these symptoms. Alternatively, intravenous iron and was discussed as an option. She could have problem with absorbing oral iron and intravenous iron can help at this time.  The rationale as well as the risks and benefits associated with Feraheme infusion were reviewed. These complications would include arthralgias, myalgias as well as rarely anaphylaxis. After discussion today she is agreeable to receive a total of 1 g of Feraheme.  2. Dysmenorrhea: Continues to follow with her gynecologist regarding this issue. She  understands as long she is having heavy menstrual losses of iron she will require repeat intravenous iron in the future.  3. Follow-up: Will be in 4 months to repeat her iron studies ensure adequate replacement.

## 2016-02-19 NOTE — Telephone Encounter (Signed)
Per LOS I have scheduled appts and notified the scheduler 

## 2016-02-20 ENCOUNTER — Telehealth: Payer: Self-pay | Admitting: Oncology

## 2016-02-20 ENCOUNTER — Telehealth: Payer: Self-pay | Admitting: *Deleted

## 2016-02-20 NOTE — Telephone Encounter (Signed)
Patient scheduled for iron infusions 10/24 and 10/31. [place] is going [place] on [place]18. But, would like to get  At least 1 treatment before the 18th and 2nd treatment when she returns 03/12/16. pof to schedulers

## 2016-02-20 NOTE — Telephone Encounter (Signed)
Spoke with pt to confirm r/s appt dates/times for iv iron per LOS

## 2016-02-21 ENCOUNTER — Telehealth: Payer: Self-pay | Admitting: *Deleted

## 2016-02-21 NOTE — Telephone Encounter (Signed)
Patient called and moved her appt on 11/10 to a later time

## 2016-02-22 ENCOUNTER — Other Ambulatory Visit: Payer: Self-pay | Admitting: Family Medicine

## 2016-02-22 NOTE — Telephone Encounter (Signed)
Last OV 12/04/15 Nystatin last filled 12/04/15 425mL

## 2016-02-26 ENCOUNTER — Telehealth: Payer: Commercial Managed Care - HMO | Admitting: Family

## 2016-02-26 DIAGNOSIS — J019 Acute sinusitis, unspecified: Secondary | ICD-10-CM

## 2016-02-26 DIAGNOSIS — B9689 Other specified bacterial agents as the cause of diseases classified elsewhere: Secondary | ICD-10-CM

## 2016-02-26 MED ORDER — DOXYCYCLINE HYCLATE 100 MG PO TABS: 100.0000 mg | ORAL_TABLET | Freq: Two times a day (BID) | ORAL | 0 refills | Status: DC

## 2016-02-26 NOTE — Progress Notes (Signed)

## 2016-03-04 ENCOUNTER — Ambulatory Visit: Payer: Commercial Managed Care - HMO

## 2016-03-11 ENCOUNTER — Telehealth: Payer: Self-pay | Admitting: Obstetrics and Gynecology

## 2016-03-11 ENCOUNTER — Ambulatory Visit: Payer: Commercial Managed Care - HMO

## 2016-03-11 NOTE — Telephone Encounter (Signed)
I recommend Provera 10 mg po q day or 10 days.  I would like for the patient to come into the office next week to do a pap and review options for avoiding recurrent heavy bleeding.  She wanted to do observation after her surgery.  We may want to discuss her options again.

## 2016-03-11 NOTE — Telephone Encounter (Signed)
Patient had a D & C and has started bleeding again.

## 2016-03-11 NOTE — Telephone Encounter (Signed)
Spoke with patient. Reviewed Dr. Elza Rafter recommendations as seen below. Patient states she used Provera 02/28/16 x5 days "so that I could enjoy my vacation". Patient states LMP started 10/17, not 10/21. RN asked if bleeding stopped with Provera, patient states bleeding slowed down but never completely stopped. Patient states she did not want to continue medication. Patient scheduled for OV with Dr. Quincy Simmonds 03/17/16 at 2:30pm. Patient verbalizes understanding and is agreeable to date and time.   Routing to provider for final review. Patient is agreeable to disposition. Will close encounter.

## 2016-03-11 NOTE — Telephone Encounter (Signed)
Spoke with patient. Patient states she had D&C 01/04/16. Patient reports "didn't really have a cycle in September" and now has been bleeding since 03/01/16. Patient reports spotting initially, and heavy bleeding the last 4-5 days, changing pad approximately q2h. Patient states she was advised to call with heavy menses or irregularity. Patient states she is scheduled for iron infusion 03/14/16. Patient denies pain and fatigue, states dizzy at times. Advised patient I would review with Dr. Quincy Simmonds for recommendations and return call. Advised patient Dr. Quincy Simmonds is in surgery this morning and response may not be immediate. Patient is agreeable.   Dr. Quincy Simmonds, please advise?

## 2016-03-14 ENCOUNTER — Ambulatory Visit (HOSPITAL_BASED_OUTPATIENT_CLINIC_OR_DEPARTMENT_OTHER): Payer: Commercial Managed Care - HMO

## 2016-03-14 VITALS — BP 110/63 | HR 89 | Temp 98.3°F | Resp 18

## 2016-03-14 DIAGNOSIS — D509 Iron deficiency anemia, unspecified: Secondary | ICD-10-CM

## 2016-03-14 MED ORDER — SODIUM CHLORIDE 0.9 % IV SOLN
510.0000 mg | Freq: Once | INTRAVENOUS | Status: AC
  Administered 2016-03-14: 510 mg via INTRAVENOUS
  Filled 2016-03-14: qty 17

## 2016-03-14 MED ORDER — SODIUM CHLORIDE 0.9 % IV SOLN
Freq: Once | INTRAVENOUS | Status: AC
  Administered 2016-03-14: 15:00:00 via INTRAVENOUS

## 2016-03-14 NOTE — Patient Instructions (Signed)

## 2016-03-17 ENCOUNTER — Encounter: Payer: Self-pay | Admitting: Obstetrics and Gynecology

## 2016-03-17 ENCOUNTER — Ambulatory Visit (INDEPENDENT_AMBULATORY_CARE_PROVIDER_SITE_OTHER): Payer: Commercial Managed Care - HMO | Admitting: Obstetrics and Gynecology

## 2016-03-17 VITALS — BP 106/66 | HR 90 | Ht 62.0 in | Wt 271.0 lb

## 2016-03-17 DIAGNOSIS — Z124 Encounter for screening for malignant neoplasm of cervix: Secondary | ICD-10-CM

## 2016-03-17 DIAGNOSIS — N921 Excessive and frequent menstruation with irregular cycle: Secondary | ICD-10-CM | POA: Diagnosis not present

## 2016-03-17 LAB — POCT URINE PREGNANCY: Preg Test, Ur: NEGATIVE

## 2016-03-17 MED ORDER — MEDROXYPROGESTERONE ACETATE 10 MG PO TABS: ORAL_TABLET | ORAL | 0 refills | Status: DC

## 2016-03-17 NOTE — Progress Notes (Signed)
GYNECOLOGY  VISIT   HPI: 45 y.o.   Married  Caucasian  female   G0P0 with No LMP recorded.   here for follow up. Patient began menses 02-25-16 which was heavy and large blood clots. Began Provera 02-28-16 for 5 - 6 days and again 03-12-16 for 5 days. Took the repeat dosage because she had a withdrawal bleeding after the first 5 days.   Prefers not to take medication.  Declines future childbearing.  Wants a permanent solution.  Prefers surgery this year.   She does have pelvic pain she describes as tenderness.  Sex can be painful. Lubrication does help this.  Pain during cycle can be controlled with ibuprofen.   Denies urinary incontinence.   Trying to quit smoking.  4 - 5 cig per day.   Takes Enbrel once weekly.  Stopped for one month and just restarted.  Prescribed by Dr. Rosalita Chessman at [facility].   Did a ferritin infusion last week and will do again this week.   Just back from [place].   UPT: Negative  GYNECOLOGIC HISTORY: No LMP recorded. Contraception: None.   Menopausal hormone therapy:  none Last mammogram:  06-21-14 Density A/Neg/[facility]  Last pap smear:   12-27-12 Neg:Neg HR HPV        OB History    Gravida Para Term Preterm AB Living   0             SAB TAB Ectopic Multiple Live Births                     Patient Active Problem List   Diagnosis Date Noted  . Costochondritis 12/04/2015  . Maxillary sinusitis, acute 12/04/2015  . Anemia, iron deficiency 10/10/2015  . Tick bites 10/10/2015  . Fatigue 10/10/2015  . Allergic rhinitis 09/07/2015  . Hemorrhoid   . Diverticulosis of colon without hemorrhage   . Hiatal hernia   . Anemia 05/08/2015  . Rectal bleeding 05/08/2015  . Chest pain, rule out acute myocardial infarction 09/27/2013  . Abnormal finding on EKG 09/16/2013  . Psoriasis 09/16/2013  . Psoriatic arthritis ([facility]) 09/16/2013  . HTN (hypertension) 09/16/2013  . Morbid obesity- BMI 47 09/16/2013  . GERD 12/11/2009  . DIARRHEA,  CHRONIC 12/11/2009    Past Medical History:  Diagnosis Date  . Anemia   . Arthritis   . Costochondritis   . Diverticulitis   . Dysmenorrhea   . GERD (gastroesophageal reflux disease)   . Headache    Migraines  . Hypertension   . Menorrhagia   . Psoriatic arthritis ([place])   . Smoker     Past Surgical History:  Procedure Laterality Date  . CARPAL TUNNEL RELEASE Left 01/19/2015   Procedure: LEFT CARPAL TUNNEL RELEASE;  Surgeon: Roseanne Kaufman, MD;  Location: [place];  Service: Orthopedics;  Laterality: Left;  . CHOLECYSTECTOMY    . CHOLECYSTECTOMY, LAPAROSCOPIC  2006  . COLONOSCOPY  1998   normal, [place]  . COLONOSCOPY N/A 05/30/2015   RMR: Minimal anal canal hemorrhoids. colonic diverticulosis. I suspect relatively trivial recent gI Bleed likely related to hemorrhoids. This finding alone would not likely adequatly explain her degree of anemia. she likely has a significent component from menstrual losses. , etc.   . DILATATION & CURETTAGE/HYSTEROSCOPY WITH MYOSURE N/A 01/04/2016   Procedure: DILATATION & CURETTAGE/HYSTEROSCOPY;  Surgeon: Nunzio Cobbs, MD;  Location: [facility];  Service: Gynecology;  Laterality: N/A;  . ESOPHAGOGASTRODUODENOSCOPY  09/11/99   tiny esophageal  erosions with mild erosive reflux/no barrett's/normal stomach  . ESOPHAGOGASTRODUODENOSCOPY N/A 05/30/2015   RMR: Hiatal hernia otherwise negative EGD    Current Outpatient Prescriptions  Medication Sig Dispense Refill  . clobetasol (TEMOVATE) 0.05 % GEL     . ENBREL SURECLICK 50 MG/ML injection Inject 50 mg as directed once a week. Wednesday    . esomeprazole (NEXIUM) 40 MG capsule TAKE 1 CAPSULE(40 MG) BY MOUTH DAILY BEFORE BREAKFAST 30 capsule 5  . hydrochlorothiazide (HYDRODIURIL) 25 MG tablet TAKE 1 TABLET BY MOUTH EVERY DAY 30 tablet 3  . ibuprofen (ADVIL,MOTRIN) 800 MG tablet Take 1 tablet (800 mg total) by mouth every 8 (eight) hours as needed. 30 tablet 0  . lisinopril  (PRINIVIL,ZESTRIL) 10 MG tablet TAKE 1 TABLET BY MOUTH EVERY DAY 30 tablet 3  . medroxyPROGESTERone (PROVERA) 10 MG tablet TAKE 1 TABLET(10 MG) BY MOUTH DAILY 10 tablet 0  . ferrous sulfate 325 (65 FE) MG tablet Take 1 tablet (325 mg total) by mouth daily with breakfast. (Patient not taking: Reported on 03/17/2016) 30 tablet 6   No current facility-administered medications for this visit.      ALLERGIES: Adhesive [tape] and Aspirin  Family History  Problem Relation Age of Onset  . Brain cancer Father   . Prostate cancer Father   . Hyperlipidemia Mother   . Colon cancer Maternal Grandmother 34  . Stroke Paternal Grandfather     COD  . Aneurysm Maternal Grandfather     Social History   Social History  . Marital status: Married    Spouse name: N/A  . Number of children: 0  . Years of education: N/A   Occupational History  . [employer]    Social History Main Topics  . Smoking status: Current Every Day Smoker    Packs/day: 0.10    Years: 20.00    Types: Cigarettes  . Smokeless tobacco: Never Used     Comment: smoking 5 cigarettes a day, hasn't quit completely   . Alcohol use 9.6 oz/week    6 Glasses of wine, 10 Standard drinks or equivalent per week     Comment: socially, weekends 4-5  . Drug use: No  . Sexual activity: Yes    Partners: Male    Birth control/ protection: None   Other Topics Concern  . Not on file   Social History Narrative  . No narrative on file    ROS:  Pertinent items are noted in HPI.  PHYSICAL EXAMINATION:    BP 106/66 (BP Location: Right Arm, Patient Position: Sitting, Cuff Size: Large)   Pulse 90   Ht 5\' 2"  (1.575 m)   Wt 271 lb (122.9 kg)   BMI 49.57 kg/m     General appearance: alert, cooperative and appears stated age Head: Normocephalic, without obvious abnormality, atraumatic Neck: no adenopathy, supple, symmetrical, trachea midline and thyroid normal to inspection and palpation Lungs: clear to auscultation  bilaterally Heart: regular rate and rhythm Abdomen: soft, non-tender, no masses,  no organomegaly Extremities: extremities normal, atraumatic, no cyanosis or edema Skin: Skin color, texture, turgor normal. No rashes or lesions Lymph nodes: Cervical, supraclavicular, and axillary nodes normal. No abnormal inguinal nodes palpated Neurologic: Grossly normal  Pelvic: External genitalia:  no lesions              Urethra:  normal appearing urethra with no masses, tenderness or lesions              Bartholins and Skenes: normal  Vagina: normal appearing vagina with normal color and discharge, no lesions.  Mild bleeding today.              Cervix: no lesions.   Pap taken.                 Bimanual Exam:  Uterus:  normal size, contour, position, consistency, mobility, non-tender              Adnexa: no mass, fullness, tenderness               Chaperone was present for exam.  ASSESSMENT  Menorrhagia with irregular menses.  Some dysmenorrhea and dyspareunia.  Mild. Status post hysteroscopy with dilation and curettage.  Anemia.  Doing ferritin infusions.  Psoriatic arthritis.  On Enbrel.  Smoker.  Obesity.   PLAN  Pap and HR HPV done.  Patient will schedule mammogram. We discussed options for treatment of bleeding. Progesterone forms of contraception - Depo Provera, Micronor, Mirena IUD and hysterectomy.  We focused on total laparoscopic hysterectomy with bilateral salpingectomy, cystoscopy, and possible bilateral oophorectomy if significant endometriosis were diagnosed intraoperatively.  I reviewed risks and benefits. Risks include but are not limited to bleeding, infection, damage to surrounding organs, pneumonia, reaction to anesthesia, DVT, PE, death, need for reoperation, hernia formation, need to convert to a traditional laparotomy incision to complete the procedure, and menopausal symptoms and decreased libido if ovaries are removed.  Hormonal medication to treat  menopausal symptoms and decreased libido may be used.  Will need to stop Enbrel two weeks prior to surgery. She will consider surgical care.   An After Visit Summary was printed and given to the patient.  __25____ minutes face to face time of which over 50% was spent in counseling.

## 2016-03-18 ENCOUNTER — Telehealth: Payer: Self-pay | Admitting: Obstetrics and Gynecology

## 2016-03-18 NOTE — Telephone Encounter (Signed)
Return call to patient. Desires to proceed with surgery on 04-08-16. Will schedule and call back to confirm.

## 2016-03-18 NOTE — Telephone Encounter (Signed)
Call back to patient. Advised surgery is scheduled for Tuesday, 04-08-16 at 0730 at [facility]. She should plan arrival at 0600 unless hospital instructs differently.  Surgery instruction sheet reviewed and printed copy will be mailed to patient, see copy scanned to chart.   Routing to provider for final review. Patient agreeable to disposition. Will close encounter.

## 2016-03-18 NOTE — Telephone Encounter (Signed)
Patient calling to schedule surgery

## 2016-03-18 NOTE — Telephone Encounter (Signed)
Patient is returning your call.  Wants to confirm her surgery date with you.

## 2016-03-19 ENCOUNTER — Other Ambulatory Visit: Payer: Self-pay | Admitting: Obstetrics and Gynecology

## 2016-03-19 ENCOUNTER — Telehealth: Payer: Self-pay | Admitting: *Deleted

## 2016-03-19 DIAGNOSIS — Z1231 Encounter for screening mammogram for malignant neoplasm of breast: Secondary | ICD-10-CM

## 2016-03-19 NOTE — Telephone Encounter (Signed)
Return call from patient. Advised patient last MMG was 06/2014 and Dr Quincy Simmonds would recommend calling to schedule.  Also reviewed bowel prep start time to be following surgery consult appointment. Patient scheduled in office at 12455 on 04-07-16. Per Dr Quincy Simmonds, can begin Magnesium Citrate following appointment. Patient agreeable to instructions.   Routing to provider for final review. Patient agreeable to disposition. Will close encounter.

## 2016-03-19 NOTE — Telephone Encounter (Signed)
Call to patient regarding surgery details. Left message to call back.

## 2016-03-20 ENCOUNTER — Encounter: Payer: Self-pay | Admitting: Family Medicine

## 2016-03-20 ENCOUNTER — Ambulatory Visit: Payer: Commercial Managed Care - HMO | Admitting: Neurology

## 2016-03-20 ENCOUNTER — Ambulatory Visit
Admission: RE | Admit: 2016-03-20 | Discharge: 2016-03-20 | Disposition: A | Discharge status: Home / Self Care | Payer: Commercial Managed Care - HMO | Source: Ambulatory Visit | Attending: Family Medicine | Admitting: Family Medicine

## 2016-03-20 ENCOUNTER — Ambulatory Visit (INDEPENDENT_AMBULATORY_CARE_PROVIDER_SITE_OTHER): Payer: Commercial Managed Care - HMO | Admitting: Family Medicine

## 2016-03-20 VITALS — BP 122/82 | HR 92 | Temp 98.5°F | Resp 17 | Ht 62.0 in | Wt 271.2 lb

## 2016-03-20 DIAGNOSIS — J0101 Acute recurrent maxillary sinusitis: Secondary | ICD-10-CM

## 2016-03-20 DIAGNOSIS — M79671 Pain in right foot: Secondary | ICD-10-CM

## 2016-03-20 MED ORDER — AMOXICILLIN 875 MG PO TABS: 875.0000 mg | ORAL_TABLET | Freq: Two times a day (BID) | ORAL | 0 refills | Status: DC

## 2016-03-20 MED ORDER — NYSTATIN 100000 UNIT/ML MT SUSP: 5.0000 mL | Freq: Four times a day (QID) | OROMUCOSAL | 0 refills | Status: DC

## 2016-03-20 NOTE — Patient Instructions (Signed)
Follow up as needed/scheduled Go to [facility] on [address] to get your xray done Wear good supportive sneakers to help w/ pain Take the Ibuprofen 800mg  3x/day w/ food for pain relief If no improvement after 1 week of scheduled ibuprofen- let me know and we'll refer to Sports Med Start the high dose Amoxicillin twice daily- take w/ food for the sinuses Drink plenty of fluids Claritin or Zyrtec daily Mucinex DM for cough/congestion Call with any questions or concerns Happy Holidays!!!

## 2016-03-20 NOTE — Progress Notes (Signed)
Pre visit review using our clinic review tool, if applicable. No additional management support is needed unless otherwise documented below in the visit note. 

## 2016-03-20 NOTE — Progress Notes (Signed)
   Subjective:    Patient ID: Dawn Hayes, female    DOB: 12-Jun-1970, 45 y.o.   MRN: [id number]  HPI Ear fullness- hx of similar.  + sick contacts, recent flight.  + muffled hearing, no drainage.  Bilaterally maxillary sinus pain.  No fevers.  + sore throat.  No fevers.    R foot pain- started Prednisone on 10/16 for costochondritis.  Foot pain started 10/26.  Hx of this previously.  No known injury.  Pain is located on bottom of foot, sharp, radiates into ankle.  Taking 800mg  ibuprofen w/ some improvement.  Pain is constant.  Did a lot of walking recently on vacation.  Not swollen.  First step is painful.   Review of Systems For ROS see HPI     Objective:   Physical Exam  Constitutional: She is oriented to person, place, and time. She appears well-developed and well-nourished. No distress.  HENT:  Head: Normocephalic and atraumatic.  Right Ear: Tympanic membrane normal.  Left Ear: Tympanic membrane normal.  Nose: Mucosal edema and rhinorrhea present. Right sinus exhibits maxillary sinus tenderness. Right sinus exhibits no frontal sinus tenderness. Left sinus exhibits maxillary sinus tenderness. Left sinus exhibits no frontal sinus tenderness.  Mouth/Throat: Uvula is midline and mucous membranes are normal. Posterior oropharyngeal erythema present. No oropharyngeal exudate.  Eyes: Conjunctivae and EOM are normal. Pupils are equal, round, and reactive to light.  Neck: Normal range of motion. Neck supple.  Cardiovascular: Normal rate, regular rhythm and normal heart sounds.   Pulmonary/Chest: Effort normal and breath sounds normal. No respiratory distress. She has no wheezes.  Musculoskeletal: She exhibits tenderness (TTP over base of 4th and 5th metatarsals). She exhibits no edema (no swelling over R foot).  Lymphadenopathy:    She has no cervical adenopathy.  Neurological: She is alert and oriented to person, place, and time.  Skin: Skin is warm and dry. Rash (psoriatic rash) noted.  No erythema.  Psychiatric: She has a normal mood and affect. Her behavior is normal. Thought content normal.  Vitals reviewed.         Assessment & Plan:  Maxillary Sinusitis- pt's sxs and PE consistent w/ infxn.  Start abx.  Reviewed supportive care and red flags that should prompt return.  Pt expressed understanding and is in agreement w/ plan.   Foot Pain- not consistent w/ plantar fasciitis.  Pain over 4th and 5th metatarsal bases.  Xray to assess for possible stress fx.  May be tendonitis or overuse injury due to recent week long [place] vacation w/ extensive walking.  If no improvement on scheduled NSAIDs x1 week (in absence of stress fx) will refer to Sports Med.  Reviewed supportive care and red flags that should prompt return.  Pt expressed understanding and is in agreement w/ plan.

## 2016-03-21 ENCOUNTER — Ambulatory Visit (HOSPITAL_BASED_OUTPATIENT_CLINIC_OR_DEPARTMENT_OTHER): Payer: Commercial Managed Care - HMO

## 2016-03-21 ENCOUNTER — Encounter: Payer: Self-pay | Admitting: Family Medicine

## 2016-03-21 VITALS — BP 121/80 | HR 99 | Temp 97.9°F | Resp 16

## 2016-03-21 DIAGNOSIS — D509 Iron deficiency anemia, unspecified: Secondary | ICD-10-CM

## 2016-03-21 MED ORDER — SODIUM CHLORIDE 0.9 % IV SOLN
510.0000 mg | Freq: Once | INTRAVENOUS | Status: AC
  Administered 2016-03-21: 510 mg via INTRAVENOUS
  Filled 2016-03-21: qty 17

## 2016-03-21 MED ORDER — SODIUM CHLORIDE 0.9 % IV SOLN
Freq: Once | INTRAVENOUS | Status: AC
  Administered 2016-03-21: 15:00:00 via INTRAVENOUS

## 2016-03-21 MED ORDER — PREDNISONE 10 MG PO TABS: ORAL_TABLET | ORAL | 0 refills | Status: DC

## 2016-03-21 NOTE — Patient Instructions (Signed)

## 2016-03-25 LAB — IPS PAP TEST WITH HPV

## 2016-03-26 NOTE — Patient Instructions (Addendum)
Your procedure is scheduled on:  Tuesday, Nov. 28, 2017  Enter through the [employer] [facility] at:  6:00AM  Pick up the phone at the desk and dial [phone].  Call this number if you have problems the morning of surgery: [phone].  Remember: Do NOT eat food or drink after:  Midnight Monday, Nov. 27, 2017  Take these medicines the morning of surgery with a SIP OF WATER:  Nexium, Hydrochlorothiazide, Lisinopril  Stop ALL herbal medications at this time  DO NOT SMOKE DAY OF SURGERY  Do NOT wear jewelry (body piercing), metal hair clips/bobby pins, make-up, or nail polish. Do NOT wear lotions, powders, or perfumes.  You may wear deodorant. Do NOT shave for 48 hours prior to surgery. Do NOT bring valuables to the hospital. Contacts, dentures, or bridgework may not be worn into surgery.  Leave suitcase in car.  After surgery it may be brought to your room.  For patients admitted to the hospital, checkout time is 11:00 AM the day of discharge.

## 2016-03-27 ENCOUNTER — Encounter (HOSPITAL_COMMUNITY)
Admission: RE | Admit: 2016-03-27 | Discharge: 2016-03-27 | Disposition: A | Discharge status: Home / Self Care | Payer: Commercial Managed Care - HMO | Source: Ambulatory Visit | Attending: Obstetrics and Gynecology | Admitting: Obstetrics and Gynecology

## 2016-03-27 ENCOUNTER — Encounter (HOSPITAL_COMMUNITY): Payer: Self-pay

## 2016-03-27 DIAGNOSIS — Z01812 Encounter for preprocedural laboratory examination: Secondary | ICD-10-CM | POA: Insufficient documentation

## 2016-03-27 DIAGNOSIS — Z0289 Encounter for other administrative examinations: Secondary | ICD-10-CM

## 2016-03-27 HISTORY — DX: Personal history of other diseases of the digestive system: Z87.19

## 2016-03-27 LAB — CBC
HEMATOCRIT: 37.7 % (ref 36.0–46.0)
HEMOGLOBIN: 12 g/dL (ref 12.0–15.0)
MCH: 27.6 pg (ref 26.0–34.0)
MCHC: 31.8 g/dL (ref 30.0–36.0)
MCV: 86.9 fL (ref 78.0–100.0)
Platelets: 248 10*3/uL (ref 150–400)
RBC: 4.34 MIL/uL (ref 3.87–5.11)
RDW: 21.7 % — ABNORMAL HIGH (ref 11.5–15.5)
WBC: 12.5 10*3/uL — AB (ref 4.0–10.5)

## 2016-03-27 LAB — COMPREHENSIVE METABOLIC PANEL
ALBUMIN: 4 g/dL (ref 3.5–5.0)
ALK PHOS: 59 U/L (ref 38–126)
ALT: 36 U/L (ref 14–54)
AST: 26 U/L (ref 15–41)
Anion gap: 9 (ref 5–15)
BILIRUBIN TOTAL: 0.7 mg/dL (ref 0.3–1.2)
BUN: 21 mg/dL — AB (ref 6–20)
CALCIUM: 9.8 mg/dL (ref 8.9–10.3)
CO2: 26 mmol/L (ref 22–32)
CREATININE: 0.71 mg/dL (ref 0.44–1.00)
Chloride: 100 mmol/L — ABNORMAL LOW (ref 101–111)
GFR calc Af Amer: >60 mL/min (ref >60)
GFR calc non Af Amer: >60 mL/min (ref >60)
GLUCOSE: 90 mg/dL (ref 65–99)
Potassium: 3.7 mmol/L (ref 3.5–5.1)
Sodium: 135 mmol/L (ref 135–145)
TOTAL PROTEIN: 7.6 g/dL (ref 6.5–8.1)

## 2016-03-27 LAB — TYPE AND SCREEN
ABO/RH(D): A POS
Antibody Screen: NEGATIVE

## 2016-03-28 ENCOUNTER — Telehealth: Payer: Self-pay | Admitting: *Deleted

## 2016-03-28 NOTE — Telephone Encounter (Signed)
-----   Message from Dawn Dom, MD sent at 03/27/2016  6:55 PM EST ----- The patients WBC is minimally elevated, please check if she has had any recent illnesses. She is having surgery as soon as Dr Quincy Simmonds gets back.  The rest of her labs are fine

## 2016-03-28 NOTE — Telephone Encounter (Signed)
Spoke with patient to review lab results as seen below per Dr. Gentry Fitz request. Patient states she is on Amoxicillin 875 mg bid po for a sinus cold. Patient states was prescribed 11/9, started 11/10 -reports initially only taking daily, did not realize was supposed to be bid. Advised I will update Dr. Talbert Nan when she returns 11/20 and call with any additional recommendations. Patient verbalizes understanding and is agreeable.   Dr. Talbert Nan, any f/u needed?  Cc: Dr. Quincy Simmonds

## 2016-03-31 NOTE — Telephone Encounter (Signed)
She should discuss the antibiotics with her primary, that explains her slightly elevated WBC. Obviously we want her feeling better by the time of her surgery. Please see how she is feeling.

## 2016-03-31 NOTE — Telephone Encounter (Signed)
Spoke with patient. Patient states she will finish antibiotics today. Patient reports " I don't feel sick at all". Advised patient will update Dr. Talbert Nan. Patient is agreeable.   Dr. Talbert Nan, ok to close encounter?

## 2016-03-31 NOTE — Telephone Encounter (Signed)
Will close encounter

## 2016-04-07 ENCOUNTER — Encounter: Payer: Self-pay | Admitting: Obstetrics and Gynecology

## 2016-04-07 ENCOUNTER — Ambulatory Visit (INDEPENDENT_AMBULATORY_CARE_PROVIDER_SITE_OTHER): Payer: Commercial Managed Care - HMO | Admitting: Obstetrics and Gynecology

## 2016-04-07 VITALS — BP 110/82 | HR 84 | Resp 14 | Ht 62.0 in | Wt 272.0 lb

## 2016-04-07 DIAGNOSIS — N921 Excessive and frequent menstruation with irregular cycle: Secondary | ICD-10-CM

## 2016-04-07 MED ORDER — ENOXAPARIN SODIUM 40 MG/0.4ML ~~LOC~~ SOLN
40.0000 mg | SUBCUTANEOUS | Status: AC
  Administered 2016-04-08: 40 mg via SUBCUTANEOUS
  Filled 2016-04-07: qty 0.4

## 2016-04-07 MED ORDER — DEXTROSE 5 % IV SOLN
2.0000 g | INTRAVENOUS | Status: AC
  Administered 2016-04-08: 2 g via INTRAVENOUS
  Filled 2016-04-07: qty 2

## 2016-04-07 MED ORDER — ENOXAPARIN SODIUM 40 MG/0.4ML ~~LOC~~ SOLN
40.0000 mg | SUBCUTANEOUS | Status: DC
  Filled 2016-04-07: qty 0.4

## 2016-04-07 NOTE — Progress Notes (Signed)
GYNECOLOGY  VISIT   HPI: 45 y.o.   Married  Caucasian  female   G0P0 with Patient's last menstrual period was 02/28/2016.   here for Pre op   HYSTERECTOMY TOTAL LAPAROSCOPIC LAPAROSCOPIC BILATERAL SALPINGECTOMY possible BSO CYSTOSCOPY.  Patient having continued heavy and irregular vaginal bleeding.  Is status post hysteroscopy with dilation and curettage on 01/04/16 and had benign endometrial polyp and secretory endometrium. Prior ultrasound from 12/27/15 showed 17 mm EMS and an 18 mm left ovarian follicle.  Right ovary was normal.  Currently on Provera 10 mg daily. Having a lot of cramping and low back pain.  Pain during cycle is controlled with ibuprofen.  Has bleeding and clots.  Pad change every couple of hours.  Has chronic anemia.  Had 2 ferritin infusions. Last infusion was done on 03/21/16.  Declines future childbearing. Declines continued medical therapy for the bleeding.  Desires hysterectomy.  Just treated for sinusitis and took a course of Amoxicillin.  No SOB or fevers.  No significant cough.   CBC on 03/27/16 - WBC 12.5 and Hgb 12.0.  Off Enbrel for 2 weeks.   Nausea and vomiting with Toradol.  Can take Motrin.  Had a convulsion with aspirin as an infant.  Able to take small doses as an adult.  Has psoriasis in umbilicus.   GYNECOLOGIC HISTORY: Patient's last menstrual period was 02/28/2016. Contraception:  None Menopausal hormone therapy:  None Last mammogram:  06/21/14 BIRADS1:Neg. Has appt 04/22/16  Last pap smear:  03/17/16 Neg. HR HPV:neg         OB History    Gravida Para Term Preterm AB Living   0             SAB TAB Ectopic Multiple Live Births                     Patient Active Problem List   Diagnosis Date Noted  . Costochondritis 12/04/2015  . Maxillary sinusitis, acute 12/04/2015  . Anemia, iron deficiency 10/10/2015  . Tick bites 10/10/2015  . Fatigue 10/10/2015  . Allergic rhinitis 09/07/2015  . Hemorrhoid   . Diverticulosis of  colon without hemorrhage   . Hiatal hernia   . Anemia 05/08/2015  . Rectal bleeding 05/08/2015  . Chest pain, rule out acute myocardial infarction 09/27/2013  . Abnormal finding on EKG 09/16/2013  . Psoriasis 09/16/2013  . Psoriatic arthritis ([place]) 09/16/2013  . HTN (hypertension) 09/16/2013  . Morbid obesity- BMI 47 09/16/2013  . GERD 12/11/2009  . DIARRHEA, CHRONIC 12/11/2009    Past Medical History:  Diagnosis Date  . Anemia   . Arthritis   . Costochondritis   . Diverticulitis   . Dysmenorrhea   . GERD (gastroesophageal reflux disease)   . Headache    Migraines  . History of hiatal hernia   . Hypertension   . Menorrhagia   . Psoriatic arthritis ([place])   . Smoker     Past Surgical History:  Procedure Laterality Date  . CARPAL TUNNEL RELEASE Left 01/19/2015   Procedure: LEFT CARPAL TUNNEL RELEASE;  Surgeon: Roseanne Kaufman, MD;  Location: [place];  Service: Orthopedics;  Laterality: Left;  . CHOLECYSTECTOMY    . CHOLECYSTECTOMY, LAPAROSCOPIC  2006  . COLONOSCOPY  1998   normal, [place]  . COLONOSCOPY N/A 05/30/2015   RMR: Minimal anal canal hemorrhoids. colonic diverticulosis. I suspect relatively trivial recent gI Bleed likely related to hemorrhoids. This finding alone would not likely adequatly explain her degree of  anemia. she likely has a significent component from menstrual losses. , etc.   . DILATATION & CURETTAGE/HYSTEROSCOPY WITH MYOSURE N/A 01/04/2016   Procedure: DILATATION & CURETTAGE/HYSTEROSCOPY;  Surgeon: Nunzio Cobbs, MD;  Location: [facility];  Service: Gynecology;  Laterality: N/A;  . ESOPHAGOGASTRODUODENOSCOPY  09/11/99   tiny esophageal erosions with mild erosive reflux/no barrett's/normal stomach  . ESOPHAGOGASTRODUODENOSCOPY N/A 05/30/2015   RMR: Hiatal hernia otherwise negative EGD    Current Outpatient Prescriptions  Medication Sig Dispense Refill  . clobetasol (TEMOVATE) 0.05 % GEL Apply 1 application topically 2 (two)  times daily as needed (for itching/irritation).     Marland Kitchen esomeprazole (NEXIUM) 40 MG capsule Take 40 mg by mouth daily before breakfast.    . hydrochlorothiazide (HYDRODIURIL) 25 MG tablet Take 25 mg by mouth daily.    Marland Kitchen ibuprofen (ADVIL,MOTRIN) 800 MG tablet Take 800 mg by mouth every 8 (eight) hours as needed for mild pain or moderate pain.    Marland Kitchen lactobacillus acidophilus (BACID) TABS tablet Take 1 tablet by mouth daily.    Marland Kitchen lisinopril (PRINIVIL,ZESTRIL) 10 MG tablet Take 10 mg by mouth daily.    . medroxyPROGESTERone (PROVERA) 10 MG tablet Take 10 mg by mouth daily.    . Multiple Vitamin (MULTIVITAMIN WITH MINERALS) TABS tablet Take 1 tablet by mouth daily.    Marland Kitchen nystatin (MYCOSTATIN) 100000 UNIT/ML suspension Take 5 mLs by mouth 4 (four) times daily as needed (for thrush). Pt is to swish and spit.    . Pyridoxine HCl (VITAMIN B-6) 500 MG tablet Take 1,000 mg by mouth daily.    Scarlette Shorts SURECLICK 50 MG/ML injection Inject 50 mg as directed every Tuesday.      No current facility-administered medications for this visit.      ALLERGIES: Aspirin and Adhesive [tape]  Family History  Problem Relation Age of Onset  . Brain cancer Father   . Prostate cancer Father   . Hyperlipidemia Mother   . Colon cancer Maternal Grandmother 56  . Stroke Paternal Grandfather     COD  . Aneurysm Maternal Grandfather     Social History   Social History  . Marital status: Married    Spouse name: N/A  . Number of children: 0  . Years of education: N/A   Occupational History  . [employer]    Social History Main Topics  . Smoking status: Current Every Day Smoker    Packs/day: 0.10    Years: 20.00    Types: Cigarettes  . Smokeless tobacco: Never Used     Comment: smoking 5 cigarettes a day, hasn't quit completely   . Alcohol use 9.6 oz/week    6 Glasses of wine, 10 Standard drinks or equivalent per week     Comment: socially, weekends 4-5  . Drug use: No  . Sexual activity: Yes     Partners: Male    Birth control/ protection: None   Other Topics Concern  . Not on file   Social History Narrative  . No narrative on file    ROS:  Pertinent items are noted in HPI.  PHYSICAL EXAMINATION:    BP 110/82 (BP Location: Right Arm, Patient Position: Sitting, Cuff Size: Large)   Pulse 84   Resp 14   Ht 5\' 2"  (1.575 m)   Wt 272 lb (123.4 kg)   LMP 02/28/2016   BMI 49.75 kg/m     General appearance: alert, cooperative and appears stated age Head: Normocephalic, without obvious abnormality,  atraumatic Neck: no adenopathy, supple, symmetrical, trachea midline and thyroid normal to inspection and palpation Lungs: clear to auscultation bilaterally Heart: regular rate and rhythm Abdomen: obese, soft, non-tender, no masses,  no organomegaly Extremities: extremities normal, atraumatic, no cyanosis or edema Skin: Skin color, texture, turgor normal. No rashes or lesions Lymph nodes: Cervical, supraclavicular, and axillary nodes normal. No abnormal inguinal nodes palpated Neurologic: Grossly normal  Pelvic: External genitalia:  no lesions              Urethra:  normal appearing urethra with no masses, tenderness or lesions              Bartholins and Skenes: normal                 Vagina: normal appearing vagina with normal color and discharge, no lesions              Cervix: no lesions                Bimanual Exam:  Uterus:  normal size, contour, position, consistency, mobility, non-tender              Adnexa: no mass, fullness, tenderness               Chaperone was present for exam.  ASSESSMENT  Menorrhagia with irregular menses.  Some dysmenorrhea and dyspareunia.  Mild. Status post hysteroscopy with dilation and curettage.  Benign endometrial polyp. Anemia.  Doing ferritin infusions. Hgb normal.  Psoriatic arthritis.  Currently off Enbrel.  Smoker.  Obesity.  Recent sinusitis.  Resolved.  PLAN  Proceed with total laparoscopic hysterectomy with  bilateral salpingectomy, cystoscopy, and possible bilateral oophorectomy if significant endometriosis were diagnosed intraoperatively.  Risks, benefits, and alternatives have been reviewed. Risks include but are not limited to bleeding, infection, damage to surrounding organs, pneumonia, reaction to anesthesia, DVT, PE, death, need for reoperation, hernia formation, need to convert to a traditional laparotomy incision to complete the procedure, and menopausal symptoms and decreased libido if ovaries are removed.  Hormonal medication to treat menopausal symptoms and decreased libido may be used.  Can likely restart Enbrel 1 - 2 weeks following surgery.     An After Visit Summary was printed and given to the patient.

## 2016-04-07 NOTE — H&P (Signed)
Office Visit   04/07/2016 Douglas, MD  Obstetrics and Gynecology   Menorrhagia with irregular cycle  Dx   Pre-op Exam ; Referred by Midge Minium, MD  Reason for Visit   Additional Documentation   Vitals:   BP 110/82 (BP Location: Right Arm, Patient Position: Sitting, Cuff Size: Large)   Pulse 84   Resp 14   Ht 5\' 2"  (1.575 m)   Wt 272 lb (123.4 kg)   LMP 02/28/2016   BMI 49.75 kg/m   BSA 2.32 m      More Vitals   Flowsheets:   Custom Formula Data,   MEWS Score,   Anthropometrics     Encounter Info:   Billing Info,   History,   Allergies,   Detailed Report     All Notes   Progress Notes by Nunzio Cobbs, MD at 04/07/2016 12:45 PM   Author: Nunzio Cobbs, MD Author Type: Physician Filed: 04/07/2016 10:54 PM  Note Status: Signed Cosign: Cosign Not Required Encounter Date: 04/07/2016 12:45 PM  Editor: Nunzio Cobbs, MD (Physician)  Prior Versions: 1. Polly Cobia, CMA (Physiological scientist) at 04/07/2016 12:36 PM - Sign at close encounter    GYNECOLOGY  VISIT   HPI: 45 y.o.   Married  Caucasian  female   G0P0 with Patient's last menstrual period was 02/28/2016.   here for Pre op   HYSTERECTOMY TOTAL LAPAROSCOPIC LAPAROSCOPIC BILATERAL SALPINGECTOMY possible BSO CYSTOSCOPY.  Patient having continued heavy and irregular vaginal bleeding.  Is status post hysteroscopy with dilation and curettage on 01/04/16 and had benign endometrial polyp and secretory endometrium. Prior ultrasound from 12/27/15 showed 17 mm EMS and an 18 mm left ovarian follicle.  Right ovary was normal.  Currently on Provera 10 mg daily. Having a lot of cramping and low back pain.  Pain during cycle is controlled with ibuprofen.  Has bleeding and clots.  Pad change every couple of hours.  Has chronic anemia.  Had 2 ferritin infusions. Last infusion was done on 03/21/16.  Declines future  childbearing. Declines continued medical therapy for the bleeding.  Desires hysterectomy.  Just treated for sinusitis and took a course of Amoxicillin.  No SOB or fevers.  No significant cough.   CBC on 03/27/16 - WBC 12.5 and Hgb 12.0.  Off Enbrel for 2 weeks.   Nausea and vomiting with Toradol.  Can take Motrin.  Had a convulsion with aspirin as an infant.  Able to take small doses as an adult.  Has psoriasis in umbilicus.   GYNECOLOGIC HISTORY: Patient's last menstrual period was 02/28/2016. Contraception:  None Menopausal hormone therapy:  None Last mammogram:  06/21/14 BIRADS1:Neg. Has appt 04/22/16  Last pap smear:  03/17/16 Neg. HR HPV:neg                 OB History    Gravida Para Term Preterm AB Living   0             SAB TAB Ectopic Multiple Live Births                         Patient Active Problem List   Diagnosis Date Noted  . Costochondritis 12/04/2015  . Maxillary sinusitis, acute 12/04/2015  . Anemia, iron deficiency 10/10/2015  . Tick bites 10/10/2015  . Fatigue 10/10/2015  . Allergic rhinitis 09/07/2015  . Hemorrhoid   .  Diverticulosis of colon without hemorrhage   . Hiatal hernia   . Anemia 05/08/2015  . Rectal bleeding 05/08/2015  . Chest pain, rule out acute myocardial infarction 09/27/2013  . Abnormal finding on EKG 09/16/2013  . Psoriasis 09/16/2013  . Psoriatic arthritis ([place]) 09/16/2013  . HTN (hypertension) 09/16/2013  . Morbid obesity- BMI 47 09/16/2013  . GERD 12/11/2009  . DIARRHEA, CHRONIC 12/11/2009        Past Medical History:  Diagnosis Date  . Anemia   . Arthritis   . Costochondritis   . Diverticulitis   . Dysmenorrhea   . GERD (gastroesophageal reflux disease)   . Headache    Migraines  . History of hiatal hernia   . Hypertension   . Menorrhagia   . Psoriatic arthritis ([place])   . Smoker          Past Surgical History:  Procedure Laterality Date  . CARPAL TUNNEL RELEASE Left  01/19/2015   Procedure: LEFT CARPAL TUNNEL RELEASE;  Surgeon: Roseanne Kaufman, MD;  Location: [facility];  Service: Orthopedics;  Laterality: Left;  . CHOLECYSTECTOMY    . CHOLECYSTECTOMY, LAPAROSCOPIC  2006  . COLONOSCOPY  1998   normal, [place]  . COLONOSCOPY N/A 05/30/2015   RMR: Minimal anal canal hemorrhoids. colonic diverticulosis. I suspect relatively trivial recent gI Bleed likely related to hemorrhoids. This finding alone would not likely adequatly explain her degree of anemia. she likely has a significent component from menstrual losses. , etc.   . DILATATION & CURETTAGE/HYSTEROSCOPY WITH MYOSURE N/A 01/04/2016   Procedure: DILATATION & CURETTAGE/HYSTEROSCOPY;  Surgeon: Nunzio Cobbs, MD;  Location: [facility];  Service: Gynecology;  Laterality: N/A;  . ESOPHAGOGASTRODUODENOSCOPY  09/11/99   tiny esophageal erosions with mild erosive reflux/no barrett's/normal stomach  . ESOPHAGOGASTRODUODENOSCOPY N/A 05/30/2015   RMR: Hiatal hernia otherwise negative EGD          Current Outpatient Prescriptions  Medication Sig Dispense Refill  . clobetasol (TEMOVATE) 0.05 % GEL Apply 1 application topically 2 (two) times daily as needed (for itching/irritation).     Marland Kitchen esomeprazole (NEXIUM) 40 MG capsule Take 40 mg by mouth daily before breakfast.    . hydrochlorothiazide (HYDRODIURIL) 25 MG tablet Take 25 mg by mouth daily.    Marland Kitchen ibuprofen (ADVIL,MOTRIN) 800 MG tablet Take 800 mg by mouth every 8 (eight) hours as needed for mild pain or moderate pain.    Marland Kitchen lactobacillus acidophilus (BACID) TABS tablet Take 1 tablet by mouth daily.    Marland Kitchen lisinopril (PRINIVIL,ZESTRIL) 10 MG tablet Take 10 mg by mouth daily.    . medroxyPROGESTERone (PROVERA) 10 MG tablet Take 10 mg by mouth daily.    . Multiple Vitamin (MULTIVITAMIN WITH MINERALS) TABS tablet Take 1 tablet by mouth daily.    Marland Kitchen nystatin (MYCOSTATIN) 100000 UNIT/ML suspension Take 5 mLs by mouth 4 (four)  times daily as needed (for thrush). Pt is to swish and spit.    . Pyridoxine HCl (VITAMIN B-6) 500 MG tablet Take 1,000 mg by mouth daily.    Scarlette Shorts SURECLICK 50 MG/ML injection Inject 50 mg as directed every Tuesday.      No current facility-administered medications for this visit.      ALLERGIES: Aspirin and Adhesive [tape]        Family History  Problem Relation Age of Onset  . Brain cancer Father   . Prostate cancer Father   . Hyperlipidemia Mother   . Colon cancer Maternal Grandmother 17  .  Stroke Paternal Grandfather     COD  . Aneurysm Maternal Grandfather     Social History        Social History  . Marital status: Married    Spouse name: N/A  . Number of children: 0  . Years of education: N/A       Occupational History  . [employer]          Social History Main Topics  . Smoking status: Current Every Day Smoker    Packs/day: 0.10    Years: 20.00    Types: Cigarettes  . Smokeless tobacco: Never Used     Comment: smoking 5 cigarettes a day, hasn't quit completely   . Alcohol use 9.6 oz/week    6 Glasses of wine, 10 Standard drinks or equivalent per week     Comment: socially, weekends 4-5  . Drug use: No  . Sexual activity: Yes    Partners: Male    Birth control/ protection: None       Other Topics Concern  . Not on file      Social History Narrative  . No narrative on file    ROS:  Pertinent items are noted in HPI.  PHYSICAL EXAMINATION:    BP 110/82 (BP Location: Right Arm, Patient Position: Sitting, Cuff Size: Large)   Pulse 84   Resp 14   Ht 5\' 2"  (1.575 m)   Wt 272 lb (123.4 kg)   LMP 02/28/2016   BMI 49.75 kg/m     General appearance: alert, cooperative and appears stated age Head: Normocephalic, without obvious abnormality, atraumatic Neck: no adenopathy, supple, symmetrical, trachea midline and thyroid normal to inspection and palpation Lungs: clear to auscultation  bilaterally Heart: regular rate and rhythm Abdomen: obese, soft, non-tender, no masses,  no organomegaly Extremities: extremities normal, atraumatic, no cyanosis or edema Skin: Skin color, texture, turgor normal. No rashes or lesions Lymph nodes: Cervical, supraclavicular, and axillary nodes normal. No abnormal inguinal nodes palpated Neurologic: Grossly normal  Pelvic: External genitalia:  no lesions              Urethra:  normal appearing urethra with no masses, tenderness or lesions              Bartholins and Skenes: normal                 Vagina: normal appearing vagina with normal color and discharge, no lesions              Cervix: no lesions                Bimanual Exam:  Uterus:  normal size, contour, position, consistency, mobility, non-tender              Adnexa: no mass, fullness, tenderness               Chaperone was present for exam.  ASSESSMENT  Menorrhagia with irregular menses.  Some dysmenorrhea and dyspareunia. Mild. Status post hysteroscopy with dilation and curettage.  Benign endometrial polyp. Anemia. Doing ferritin infusions. Hgb normal.  Psoriatic arthritis. Currently off Enbrel.  Smoker.  Obesity.  Recent sinusitis.  Resolved.  PLAN  Proceed with total laparoscopic hysterectomy with bilateral salpingectomy, cystoscopy, and possible bilateral oophorectomy if significant endometriosis were diagnosed intraoperatively.  Risks, benefits, and alternatives have been reviewed. Risks include but are not limited to bleeding, infection, damage to surrounding organs, pneumonia, reaction to anesthesia, DVT, PE, death, need for reoperation, hernia formation,  need to convert to a traditional laparotomy incision to complete the procedure, and menopausal symptoms and decreased libido if ovaries are removed. Hormonal medication to treat menopausal symptoms and decreased libido may be used.  Can likely restart Enbrel 1 - 2 weeks following surgery.     An After  Visit Summary was printed and given to the patient.

## 2016-04-07 NOTE — Patient Instructions (Signed)
Take the bottle of magnesium citrate when you return home now.

## 2016-04-08 ENCOUNTER — Ambulatory Visit (HOSPITAL_COMMUNITY): Payer: Commercial Managed Care - HMO | Admitting: Anesthesiology

## 2016-04-08 ENCOUNTER — Encounter (HOSPITAL_COMMUNITY)
Admission: RE | Disposition: A | Discharge status: Home / Self Care | Payer: Self-pay | Source: Ambulatory Visit | Attending: Obstetrics and Gynecology

## 2016-04-08 ENCOUNTER — Encounter (HOSPITAL_COMMUNITY): Payer: Self-pay | Admitting: *Deleted

## 2016-04-08 ENCOUNTER — Inpatient Hospital Stay (HOSPITAL_COMMUNITY)
Admission: RE | Admit: 2016-04-08 | Discharge: 2016-04-10 | DRG: 743 | Disposition: A | Discharge status: Home / Self Care | Payer: Commercial Managed Care - HMO | Source: Ambulatory Visit | Attending: Obstetrics and Gynecology | Admitting: Obstetrics and Gynecology

## 2016-04-08 DIAGNOSIS — D649 Anemia, unspecified: Secondary | ICD-10-CM | POA: Diagnosis present

## 2016-04-08 DIAGNOSIS — I10 Essential (primary) hypertension: Secondary | ICD-10-CM | POA: Diagnosis present

## 2016-04-08 DIAGNOSIS — L4050 Arthropathic psoriasis, unspecified: Secondary | ICD-10-CM | POA: Diagnosis present

## 2016-04-08 DIAGNOSIS — K449 Diaphragmatic hernia without obstruction or gangrene: Secondary | ICD-10-CM | POA: Diagnosis present

## 2016-04-08 DIAGNOSIS — F17200 Nicotine dependence, unspecified, uncomplicated: Secondary | ICD-10-CM | POA: Diagnosis present

## 2016-04-08 DIAGNOSIS — D509 Iron deficiency anemia, unspecified: Secondary | ICD-10-CM | POA: Diagnosis not present

## 2016-04-08 DIAGNOSIS — N941 Unspecified dyspareunia: Secondary | ICD-10-CM | POA: Diagnosis present

## 2016-04-08 DIAGNOSIS — K219 Gastro-esophageal reflux disease without esophagitis: Secondary | ICD-10-CM | POA: Diagnosis present

## 2016-04-08 DIAGNOSIS — N921 Excessive and frequent menstruation with irregular cycle: Secondary | ICD-10-CM | POA: Diagnosis present

## 2016-04-08 DIAGNOSIS — Z90710 Acquired absence of both cervix and uterus: Secondary | ICD-10-CM | POA: Diagnosis present

## 2016-04-08 DIAGNOSIS — N946 Dysmenorrhea, unspecified: Secondary | ICD-10-CM | POA: Diagnosis present

## 2016-04-08 DIAGNOSIS — L299 Pruritus, unspecified: Secondary | ICD-10-CM | POA: Diagnosis not present

## 2016-04-08 HISTORY — PX: LAPAROSCOPIC LYSIS OF ADHESIONS: SHX5905

## 2016-04-08 HISTORY — PX: LAPAROSCOPIC HYSTERECTOMY: SHX1926

## 2016-04-08 HISTORY — PX: CYSTOSCOPY: SHX5120

## 2016-04-08 HISTORY — PX: HYSTERECTOMY ABDOMINAL WITH SALPINGECTOMY: SHX6725

## 2016-04-08 HISTORY — PX: LAPAROSCOPIC BILATERAL SALPINGECTOMY: SHX5889

## 2016-04-08 LAB — PREGNANCY, URINE: PREG TEST UR: NEGATIVE

## 2016-04-08 SURGERY — HYSTERECTOMY, TOTAL, LAPAROSCOPIC
Anesthesia: General | Site: Urethra

## 2016-04-08 MED ORDER — LIDOCAINE HCL (CARDIAC) 20 MG/ML IV SOLN
INTRAVENOUS | Status: DC | PRN
  Administered 2016-04-08: 30 mg via INTRAVENOUS
  Administered 2016-04-08: 7 mg via INTRAVENOUS

## 2016-04-08 MED ORDER — BACID PO TABS: 1.0000 | ORAL_TABLET | Freq: Every day | ORAL | Status: DC

## 2016-04-08 MED ORDER — ONDANSETRON HCL 4 MG/2ML IJ SOLN: 4.0000 mg | Freq: Four times a day (QID) | INTRAMUSCULAR | Status: DC | PRN

## 2016-04-08 MED ORDER — LACTATED RINGERS IV SOLN
INTRAVENOUS | Status: DC
  Administered 2016-04-08: 16:00:00 via INTRAVENOUS

## 2016-04-08 MED ORDER — SUCCINYLCHOLINE 20MG/ML (10ML) SYRINGE FOR MEDFUSION PUMP - OPTIME
INTRAMUSCULAR | Status: DC | PRN
  Administered 2016-04-08: 120 mg via INTRAVENOUS

## 2016-04-08 MED ORDER — SUGAMMADEX SODIUM 500 MG/5ML IV SOLN
INTRAVENOUS | Status: AC
  Filled 2016-04-08: qty 5

## 2016-04-08 MED ORDER — LISINOPRIL 10 MG PO TABS
10.0000 mg | ORAL_TABLET | Freq: Every day | ORAL | Status: DC
  Filled 2016-04-08 (×4): qty 1

## 2016-04-08 MED ORDER — SUGAMMADEX SODIUM 500 MG/5ML IV SOLN
INTRAVENOUS | Status: DC | PRN
  Administered 2016-04-08: 246.8 mg via INTRAVENOUS

## 2016-04-08 MED ORDER — ROCURONIUM BROMIDE 100 MG/10ML IV SOLN
INTRAVENOUS | Status: DC | PRN
  Administered 2016-04-08: 10 mg via INTRAVENOUS
  Administered 2016-04-08: 5 mg via INTRAVENOUS
  Administered 2016-04-08: 20 mg via INTRAVENOUS
  Administered 2016-04-08: 15 mg via INTRAVENOUS
  Administered 2016-04-08: 40 mg via INTRAVENOUS
  Administered 2016-04-08: 15 mg via INTRAVENOUS
  Administered 2016-04-08: 10 mg via INTRAVENOUS

## 2016-04-08 MED ORDER — ALBUTEROL SULFATE HFA 108 (90 BASE) MCG/ACT IN AERS
INHALATION_SPRAY | RESPIRATORY_TRACT | Status: DC | PRN
  Administered 2016-04-08 (×2): 2 via RESPIRATORY_TRACT
  Administered 2016-04-08: 1 via RESPIRATORY_TRACT

## 2016-04-08 MED ORDER — PROPOFOL 10 MG/ML IV BOLUS
INTRAVENOUS | Status: AC
  Filled 2016-04-08: qty 20

## 2016-04-08 MED ORDER — HYDROMORPHONE HCL 1 MG/ML IJ SOLN
1.0000 mg | Freq: Once | INTRAMUSCULAR | Status: AC
  Administered 2016-04-08: 1 mg via INTRAVENOUS

## 2016-04-08 MED ORDER — ACETAMINOPHEN 10 MG/ML IV SOLN
1000.0000 mg | Freq: Once | INTRAVENOUS | Status: AC
  Administered 2016-04-08: 1000 mg via INTRAVENOUS
  Filled 2016-04-08: qty 100

## 2016-04-08 MED ORDER — DEXAMETHASONE SODIUM PHOSPHATE 10 MG/ML IJ SOLN
INTRAMUSCULAR | Status: DC | PRN
  Administered 2016-04-08: 4 mg via INTRAVENOUS

## 2016-04-08 MED ORDER — ALBUTEROL SULFATE HFA 108 (90 BASE) MCG/ACT IN AERS
INHALATION_SPRAY | RESPIRATORY_TRACT | Status: AC
  Filled 2016-04-08: qty 6.7

## 2016-04-08 MED ORDER — HYDROMORPHONE HCL 1 MG/ML IJ SOLN
0.2500 mg | INTRAMUSCULAR | Status: DC | PRN
  Administered 2016-04-08 (×4): 0.5 mg via INTRAVENOUS

## 2016-04-08 MED ORDER — ROCURONIUM BROMIDE 100 MG/10ML IV SOLN
INTRAVENOUS | Status: AC
  Filled 2016-04-08: qty 1

## 2016-04-08 MED ORDER — DIPHENHYDRAMINE HCL 50 MG/ML IJ SOLN
12.5000 mg | Freq: Four times a day (QID) | INTRAMUSCULAR | Status: DC | PRN
  Administered 2016-04-09: 12.5 mg via INTRAVENOUS
  Filled 2016-04-08: qty 1

## 2016-04-08 MED ORDER — MIDAZOLAM HCL 2 MG/2ML IJ SOLN
INTRAMUSCULAR | Status: AC
  Filled 2016-04-08: qty 2

## 2016-04-08 MED ORDER — OXYCODONE-ACETAMINOPHEN 5-325 MG PO TABS
1.0000 | ORAL_TABLET | ORAL | Status: DC | PRN
  Administered 2016-04-09: 2 via ORAL
  Administered 2016-04-09: 1 via ORAL
  Administered 2016-04-09 – 2016-04-10 (×3): 2 via ORAL
  Filled 2016-04-08 (×2): qty 2
  Filled 2016-04-08: qty 1
  Filled 2016-04-08 (×2): qty 2

## 2016-04-08 MED ORDER — ROPIVACAINE HCL 5 MG/ML IJ SOLN
INTRAMUSCULAR | Status: AC
  Filled 2016-04-08: qty 30

## 2016-04-08 MED ORDER — MIDAZOLAM HCL 2 MG/2ML IJ SOLN
INTRAMUSCULAR | Status: DC | PRN
Start: 2016-04-08 — End: 2016-04-08
  Administered 2016-04-08 (×2): 1 mg via INTRAVENOUS

## 2016-04-08 MED ORDER — HYDROMORPHONE 1 MG/ML IV SOLN
INTRAVENOUS | Status: DC
  Administered 2016-04-08: 15:00:00 via INTRAVENOUS
  Administered 2016-04-08 (×2): 1.2 mg via INTRAVENOUS
  Administered 2016-04-09: 2 mg via INTRAVENOUS
  Administered 2016-04-09: 1 mg via INTRAVENOUS
  Filled 2016-04-08: qty 25

## 2016-04-08 MED ORDER — HYDROMORPHONE HCL 1 MG/ML IJ SOLN
INTRAMUSCULAR | Status: AC
  Administered 2016-04-08: 0.5 mg via INTRAVENOUS
  Filled 2016-04-08: qty 1

## 2016-04-08 MED ORDER — IBUPROFEN 600 MG PO TABS
600.0000 mg | ORAL_TABLET | Freq: Four times a day (QID) | ORAL | Status: DC | PRN
  Administered 2016-04-09 – 2016-04-10 (×3): 600 mg via ORAL
  Filled 2016-04-08 (×3): qty 1

## 2016-04-08 MED ORDER — BUPIVACAINE HCL (PF) 0.25 % IJ SOLN
INTRAMUSCULAR | Status: DC | PRN
  Administered 2016-04-08: 8 mL

## 2016-04-08 MED ORDER — ONDANSETRON HCL 4 MG/2ML IJ SOLN
INTRAMUSCULAR | Status: AC
  Filled 2016-04-08: qty 2

## 2016-04-08 MED ORDER — VITAMIN B-6 100 MG PO TABS: 1000.0000 mg | ORAL_TABLET | Freq: Every day | ORAL | Status: DC

## 2016-04-08 MED ORDER — SUCCINYLCHOLINE CHLORIDE 200 MG/10ML IV SOSY
PREFILLED_SYRINGE | INTRAVENOUS | Status: AC
  Filled 2016-04-08: qty 10

## 2016-04-08 MED ORDER — HYDROMORPHONE HCL 1 MG/ML IJ SOLN
INTRAMUSCULAR | Status: AC
  Filled 2016-04-08: qty 1

## 2016-04-08 MED ORDER — RISAQUAD PO CAPS
1.0000 | ORAL_CAPSULE | Freq: Every day | ORAL | Status: DC
  Administered 2016-04-09: 1 via ORAL
  Filled 2016-04-08 (×3): qty 1

## 2016-04-08 MED ORDER — SCOPOLAMINE 1 MG/3DAYS TD PT72
1.0000 | MEDICATED_PATCH | Freq: Once | TRANSDERMAL | Status: DC
  Administered 2016-04-08: 1.5 mg via TRANSDERMAL

## 2016-04-08 MED ORDER — LACTATED RINGERS IV SOLN
INTRAVENOUS | Status: DC
  Administered 2016-04-08 (×3): via INTRAVENOUS

## 2016-04-08 MED ORDER — DEXAMETHASONE SODIUM PHOSPHATE 4 MG/ML IJ SOLN
INTRAMUSCULAR | Status: AC
  Filled 2016-04-08: qty 1

## 2016-04-08 MED ORDER — FENTANYL CITRATE (PF) 250 MCG/5ML IJ SOLN
INTRAMUSCULAR | Status: AC
  Filled 2016-04-08: qty 5

## 2016-04-08 MED ORDER — ONDANSETRON HCL 4 MG/2ML IJ SOLN
INTRAMUSCULAR | Status: DC | PRN
  Administered 2016-04-08: 4 mg via INTRAVENOUS

## 2016-04-08 MED ORDER — DIPHENHYDRAMINE HCL 12.5 MG/5ML PO ELIX
12.5000 mg | ORAL_SOLUTION | Freq: Four times a day (QID) | ORAL | Status: DC | PRN
  Administered 2016-04-09: 12.5 mg via ORAL
  Filled 2016-04-08: qty 5

## 2016-04-08 MED ORDER — CLOBETASOL PROPIONATE 0.05 % EX GEL: 1.0000 "application " | Freq: Two times a day (BID) | CUTANEOUS | Status: DC | PRN

## 2016-04-08 MED ORDER — BUPIVACAINE HCL (PF) 0.25 % IJ SOLN
INTRAMUSCULAR | Status: AC
  Filled 2016-04-08: qty 30

## 2016-04-08 MED ORDER — LACTATED RINGERS IR SOLN
Status: DC | PRN
  Administered 2016-04-08: 3000 mL

## 2016-04-08 MED ORDER — NALOXONE HCL 0.4 MG/ML IJ SOLN: 0.4000 mg | INTRAMUSCULAR | Status: DC | PRN

## 2016-04-08 MED ORDER — SODIUM CHLORIDE 0.9 % IJ SOLN
INTRAMUSCULAR | Status: AC
  Filled 2016-04-08: qty 50

## 2016-04-08 MED ORDER — ADULT MULTIVITAMIN W/MINERALS CH
1.0000 | ORAL_TABLET | Freq: Every day | ORAL | Status: DC
  Administered 2016-04-09: 1 via ORAL
  Filled 2016-04-08 (×4): qty 1

## 2016-04-08 MED ORDER — MENTHOL 3 MG MT LOZG: 1.0000 | LOZENGE | OROMUCOSAL | Status: DC | PRN

## 2016-04-08 MED ORDER — PANTOPRAZOLE SODIUM 40 MG PO TBEC
80.0000 mg | DELAYED_RELEASE_TABLET | Freq: Every day | ORAL | Status: DC
  Administered 2016-04-09: 80 mg via ORAL
  Filled 2016-04-08: qty 2

## 2016-04-08 MED ORDER — NYSTATIN 100000 UNIT/ML MT SUSP
5.0000 mL | Freq: Four times a day (QID) | OROMUCOSAL | Status: DC | PRN
  Filled 2016-04-08: qty 5

## 2016-04-08 MED ORDER — SCOPOLAMINE 1 MG/3DAYS TD PT72
MEDICATED_PATCH | TRANSDERMAL | Status: AC
  Administered 2016-04-08: 1.5 mg via TRANSDERMAL
  Filled 2016-04-08: qty 1

## 2016-04-08 MED ORDER — PROPOFOL 10 MG/ML IV BOLUS
INTRAVENOUS | Status: DC | PRN
  Administered 2016-04-08: 200 mg via INTRAVENOUS

## 2016-04-08 MED ORDER — HYDROCHLOROTHIAZIDE 25 MG PO TABS
25.0000 mg | ORAL_TABLET | Freq: Every day | ORAL | Status: DC
  Administered 2016-04-09 – 2016-04-10 (×2): 25 mg via ORAL
  Filled 2016-04-08 (×3): qty 1

## 2016-04-08 MED ORDER — LIDOCAINE HCL (CARDIAC) 20 MG/ML IV SOLN
INTRAVENOUS | Status: AC
  Filled 2016-04-08: qty 5

## 2016-04-08 MED ORDER — FENTANYL CITRATE (PF) 100 MCG/2ML IJ SOLN
INTRAMUSCULAR | Status: DC | PRN
  Administered 2016-04-08: 50 ug via INTRAVENOUS
  Administered 2016-04-08: 25 ug via INTRAVENOUS
  Administered 2016-04-08 (×2): 50 ug via INTRAVENOUS
  Administered 2016-04-08: 25 ug via INTRAVENOUS
  Administered 2016-04-08 (×6): 50 ug via INTRAVENOUS

## 2016-04-08 MED ORDER — ARTIFICIAL TEARS OP OINT
TOPICAL_OINTMENT | OPHTHALMIC | Status: AC
  Filled 2016-04-08: qty 3.5

## 2016-04-08 MED ORDER — LACTATED RINGERS IV SOLN
INTRAVENOUS | Status: DC
  Administered 2016-04-08: 07:00:00 via INTRAVENOUS

## 2016-04-08 MED ORDER — SODIUM CHLORIDE 0.9% FLUSH: 9.0000 mL | INTRAVENOUS | Status: DC | PRN

## 2016-04-08 MED ORDER — STERILE WATER FOR IRRIGATION IR SOLN
Status: DC | PRN
  Administered 2016-04-08: 1000 mL

## 2016-04-08 MED ORDER — ONDANSETRON HCL 4 MG PO TABS: 4.0000 mg | ORAL_TABLET | Freq: Four times a day (QID) | ORAL | Status: DC | PRN

## 2016-04-08 SURGICAL SUPPLY — 82 items
ADH SKN CLS APL DERMABOND .7 (GAUZE/BANDAGES/DRESSINGS) ×2
APL SKNCLS STERI-STRIP NONHPOA (GAUZE/BANDAGES/DRESSINGS)
APL SRG 38 LTWT LNG FL B (MISCELLANEOUS)
APPLICATOR ARISTA FLEXITIP XL (MISCELLANEOUS) IMPLANT
BAG SPEC RTRVL LRG 6X4 10 (ENDOMECHANICALS)
BARRIER ADHS 3X4 INTERCEED (GAUZE/BANDAGES/DRESSINGS) IMPLANT
BENZOIN TINCTURE PRP APPL 2/3 (GAUZE/BANDAGES/DRESSINGS) IMPLANT
BRR ADH 4X3 ABS CNTRL BYND (GAUZE/BANDAGES/DRESSINGS)
CABLE HIGH FREQUENCY MONO STRZ (ELECTRODE) ×5 IMPLANT
CANISTER SUCT 3000ML (MISCELLANEOUS) ×5 IMPLANT
CLOTH BEACON ORANGE TIMEOUT ST (SAFETY) ×5 IMPLANT
DECANTER SPIKE VIAL GLASS SM (MISCELLANEOUS) ×5 IMPLANT
DERMABOND ADVANCED (GAUZE/BANDAGES/DRESSINGS) ×1
DERMABOND ADVANCED .7 DNX12 (GAUZE/BANDAGES/DRESSINGS) ×4 IMPLANT
DRSG OPSITE POSTOP 3X4 (GAUZE/BANDAGES/DRESSINGS) ×5 IMPLANT
DRSG OPSITE POSTOP 4X10 (GAUZE/BANDAGES/DRESSINGS) ×5 IMPLANT
DRSG TEGADERM 8X12 (GAUZE/BANDAGES/DRESSINGS) ×5 IMPLANT
DURAPREP 26ML APPLICATOR (WOUND CARE) ×5 IMPLANT
ELECT REM PT RETURN 9FT ADLT (ELECTROSURGICAL) ×5
ELECTRODE REM PT RTRN 9FT ADLT (ELECTROSURGICAL) ×4 IMPLANT
FILTER SMOKE EVAC LAPAROSHD (FILTER) ×5 IMPLANT
FORCEPS CUTTING 33CM 5MM (CUTTING FORCEPS) IMPLANT
GAUZE SPONGE 4X4 16PLY XRAY LF (GAUZE/BANDAGES/DRESSINGS) ×5 IMPLANT
GLOVE BIO SURGEON STRL SZ 6.5 (GLOVE) ×10 IMPLANT
GLOVE BIOGEL PI IND STRL 7.0 (GLOVE) ×12 IMPLANT
GLOVE BIOGEL PI INDICATOR 7.0 (GLOVE) ×3
GOWN STRL REUS W/TWL LRG LVL3 (GOWN DISPOSABLE) ×15 IMPLANT
HEMOSTAT ARISTA ABSORB 3G PWDR (MISCELLANEOUS) IMPLANT
LIGASURE VESSEL 5MM BLUNT TIP (ELECTROSURGICAL) ×5 IMPLANT
NEEDLE INSUFFLATION 120MM (ENDOMECHANICALS) ×5 IMPLANT
NS IRRIG 1000ML POUR BTL (IV SOLUTION) ×5 IMPLANT
OCCLUDER COLPOPNEUMO (BALLOONS) ×5 IMPLANT
PACK LAPAROSCOPY BASIN (CUSTOM PROCEDURE TRAY) ×5 IMPLANT
PACK TRENDGUARD 450 HYBRID PRO (MISCELLANEOUS) IMPLANT
PACK TRENDGUARD 600 HYBRD PROC (MISCELLANEOUS) IMPLANT
PACK VAGINAL MINOR WOMEN LF (CUSTOM PROCEDURE TRAY) IMPLANT
PENCIL BUTTON HOLSTER BLD 10FT (ELECTRODE) ×5 IMPLANT
POUCH LAPAROSCOPIC INSTRUMENT (MISCELLANEOUS) ×5 IMPLANT
POUCH SPECIMEN RETRIEVAL 10MM (ENDOMECHANICALS) IMPLANT
PROTECTOR NERVE ULNAR (MISCELLANEOUS) ×10 IMPLANT
SCISSORS LAP 5X35 DISP (ENDOMECHANICALS) ×5 IMPLANT
SET CYSTO W/LG BORE CLAMP LF (SET/KITS/TRAYS/PACK) ×5 IMPLANT
SET IRRIG TUBING LAPAROSCOPIC (IRRIGATION / IRRIGATOR) ×10 IMPLANT
SET TRI-LUMEN FLTR TB AIRSEAL (TUBING) ×5 IMPLANT
SLEEVE ADV FIXATION 5X100MM (TROCAR) ×5 IMPLANT
SOLUTION ELECTROLUBE (MISCELLANEOUS) IMPLANT
SPONGE LAP 18X18 X RAY DECT (DISPOSABLE) ×10 IMPLANT
STRIP CLOSURE SKIN 1/4X3 (GAUZE/BANDAGES/DRESSINGS) IMPLANT
SUT PLAIN 2 0 XLH (SUTURE) ×5 IMPLANT
SUT PLAIN 3 0 FS 2 27 (SUTURE) ×5 IMPLANT
SUT VIC AB 0 CT1 27 (SUTURE) ×15
SUT VIC AB 0 CT1 27XBRD ANBCTR (SUTURE) ×20 IMPLANT
SUT VIC AB 2-0 CT1 27 (SUTURE) ×4
SUT VIC AB 2-0 CT1 TAPERPNT 27 (SUTURE) ×4 IMPLANT
SUT VIC AB 2-0 SH 27 (SUTURE) ×4
SUT VIC AB 2-0 SH 27XBRD (SUTURE) ×4 IMPLANT
SUT VICRYL 0 UR6 27IN ABS (SUTURE) ×5 IMPLANT
SUT VICRYL 4-0 PS2 18IN ABS (SUTURE) ×5 IMPLANT
SUT VLOC 180 0 9IN  GS21 (SUTURE)
SUT VLOC 180 0 9IN GS21 (SUTURE) IMPLANT
SYR 50ML LL SCALE MARK (SYRINGE) ×10 IMPLANT
SYRINGE 10CC LL (SYRINGE) ×5 IMPLANT
SYSTEM CARTER THOMASON II (TROCAR) IMPLANT
TIP RUMI ORANGE 6.7MMX12CM (TIP) IMPLANT
TIP UTERINE 5.1X6CM LAV DISP (MISCELLANEOUS) IMPLANT
TIP UTERINE 6.7X10CM GRN DISP (MISCELLANEOUS) IMPLANT
TIP UTERINE 6.7X6CM WHT DISP (MISCELLANEOUS) IMPLANT
TIP UTERINE 6.7X8CM BLUE DISP (MISCELLANEOUS) ×5 IMPLANT
TOWEL OR 17X24 6PK STRL BLUE (TOWEL DISPOSABLE) ×10 IMPLANT
TRAY FOLEY CATH SILVER 14FR (SET/KITS/TRAYS/PACK) ×5 IMPLANT
TRENDGUARD 450 HYBRID PRO PACK (MISCELLANEOUS)
TRENDGUARD 600 HYBRID PROC PK (MISCELLANEOUS)
TROCAR ADV FIXATION 5X100MM (TROCAR) IMPLANT
TROCAR PORT AIRSEAL 8X120 (TROCAR) ×5 IMPLANT
TROCAR XCEL DIL TIP R 11M (ENDOMECHANICALS) IMPLANT
TROCAR XCEL NON-BLD 11X100MML (ENDOMECHANICALS) IMPLANT
TROCAR XCEL NON-BLD 5MMX100MML (ENDOMECHANICALS) ×5 IMPLANT
TROCAR XCEL OPT SLVE 5M 100M (ENDOMECHANICALS) IMPLANT
TUBING NON-CON 1/4 X 20 CONN (TUBING) ×5 IMPLANT
WARMER LAPAROSCOPE (MISCELLANEOUS) ×5 IMPLANT
WATER STERILE IRR 1000ML POUR (IV SOLUTION) IMPLANT
YANKAUER SUCT BULB TIP NO VENT (SUCTIONS) ×5 IMPLANT

## 2016-04-08 NOTE — Progress Notes (Signed)
Update to History and Physical  No marked change in status since office visit yesterday.  Patient examined.  OK to proceed with surgery.

## 2016-04-08 NOTE — Anesthesia Procedure Notes (Signed)
Procedure Name: Intubation Date/Time: 04/08/2016 7:42 AM Performed by: Tobin Chad Pre-anesthesia Checklist: Patient identified, Emergency Drugs available, Suction available, Patient being monitored and Timeout performed Patient Re-evaluated:Patient Re-evaluated prior to inductionOxygen Delivery Method: Circle system utilized and Simple face mask Preoxygenation: Pre-oxygenation with 100% oxygen Intubation Type: IV induction Ventilation: Mask ventilation without difficulty Laryngoscope Size: Mac and 4 Tube type: Oral Tube size: 7.0 mm Number of attempts: 1 Airway Equipment and Method: Stylet Placement Confirmation: ETT inserted through vocal cords under direct vision,  positive ETCO2 and breath sounds checked- equal and bilateral Secured at: 21 cm Tube secured with: Tape Dental Injury: Teeth and Oropharynx as per pre-operative assessment  Difficulty Due To: Difficulty was anticipated Future Recommendations: Recommend- induction with short-acting agent, and alternative techniques readily available

## 2016-04-08 NOTE — Anesthesia Postprocedure Evaluation (Signed)
Anesthesia Post Note  Patient: Dawn Hayes  Procedure(s) Performed: Procedure(s) (LRB): ATTEMPTED HYSTERECTOMY TOTAL LAPAROSCOPIC (N/A) LAPAROSCOPIC BILATERAL SALPINGECTOMY possible BSO (N/A) CYSTOSCOPY (N/A) HYSTERECTOMY ABDOMINAL WITH SALPINGECTOMY (Bilateral) LAPAROSCOPIC LYSIS OF ADHESIONS  Patient location during evaluation: [facility] Anesthesia Type: General Level of consciousness: awake and oriented Pain management: pain level controlled Respiratory status: spontaneous breathing and patient connected to nasal cannula oxygen Postop Assessment: no signs of nausea or vomiting and adequate PO intake Anesthetic complications: no     Last Vitals: 129/92; 96;23 Vitals:   04/08/16 1807 04/08/16 1941  BP:  115/62  Pulse:  87  Resp: 14 18  Temp:  37.2 C    Last Pain:  Vitals:   04/08/16 1941  TempSrc: Oral  PainSc:    Pain Goal: Patients Stated Pain Goal: 3 (04/08/16 1807)               Francis Dowse J

## 2016-04-08 NOTE — Progress Notes (Signed)
Day of Surgery Procedure(s) (LRB): ATTEMPTED HYSTERECTOMY TOTAL LAPAROSCOPIC (N/A) LAPAROSCOPIC BILATERAL SALPINGECTOMY possible BSO (N/A) CYSTOSCOPY (N/A) HYSTERECTOMY ABDOMINAL WITH SALPINGECTOMY (Bilateral) LAPAROSCOPIC LYSIS OF ADHESIONS  Subjective: Patient reports good pain control with Dilaudid PCA.  Denies nausea.  Asking about food.   Objective: I have reviewed patient's vital signs and intake and output. Vitals:   04/08/16 1550 04/08/16 1650  BP: 129/75 125/78  Pulse: 89 88  Resp: 18 20  Temp: 98 F (36.7 C) 98.3 F (36.8 C)   I/O - 2900 cc/1050 cc UO  General: alert and cooperative Resp: clear to auscultation bilaterally Cardio: regular rate and rhythm, S1, S2 normal, no murmur, click, rub or gallop GI: soft, non-tender; bowel sounds normal; no masses,  no organomegaly and incision: clean, dry and intact Extremities:  Ted hose on.  DPs 2+ bilaterally.  Vaginal Bleeding: none  Assessment: s/p Procedure(s): ATTEMPTED HYSTERECTOMY TOTAL LAPAROSCOPIC (N/A) LAPAROSCOPIC BILATERAL SALPINGECTOMY possible BSO (N/A) CYSTOSCOPY (N/A) HYSTERECTOMY ABDOMINAL WITH SALPINGECTOMY (Bilateral) LAPAROSCOPIC LYSIS OF ADHESIONS: stable  Plan: Dilaudid PCA.   Foley overnight.  Ambulate tonight with assistance.  CBC and BMP in am.  Discussed surgical findings and procedure.    LOS: 0 days    Dawn Hayes 04/08/2016, 5:33 PM

## 2016-04-08 NOTE — Addendum Note (Signed)
Addendum  created 04/08/16 2031 by Enis Slipper, CRNA   Sign clinical note

## 2016-04-08 NOTE — Anesthesia Postprocedure Evaluation (Signed)
Anesthesia Post Note  Patient: Dawn Hayes  Procedure(s) Performed: Procedure(s) (LRB): ATTEMPTED HYSTERECTOMY TOTAL LAPAROSCOPIC (N/A) LAPAROSCOPIC BILATERAL SALPINGECTOMY possible BSO (N/A) CYSTOSCOPY (N/A) HYSTERECTOMY ABDOMINAL WITH SALPINGECTOMY (Bilateral)  Patient location during evaluation: PACU Anesthesia Type: General Level of consciousness: awake Pain management: pain level controlled Respiratory status: spontaneous breathing Cardiovascular status: stable Anesthetic complications: no     Last Vitals:  Vitals:   04/08/16 0611 04/08/16 0617  BP: (!) 93/51 127/89  Pulse: 79   Resp: 18   Temp: 36.4 C     Last Pain:  Vitals:   04/08/16 0611  TempSrc: Oral   Pain Goal: Patients Stated Pain Goal: 3 (04/08/16 [id number])               EDWARDS,Syrah

## 2016-04-08 NOTE — Transfer of Care (Signed)
Immediate Anesthesia Transfer of Care Note  Patient: Dawn Hayes  Procedure(s) Performed: Procedure(s): ATTEMPTED HYSTERECTOMY TOTAL LAPAROSCOPIC (N/A) LAPAROSCOPIC BILATERAL SALPINGECTOMY possible BSO (N/A) CYSTOSCOPY (N/A) HYSTERECTOMY ABDOMINAL WITH SALPINGECTOMY (Bilateral)  Patient Location: PACU  Anesthesia Type:General  Level of Consciousness: awake, alert , oriented and patient cooperative  Airway & Oxygen Therapy: Patient Spontanous Breathing and Patient connected to nasal cannula oxygen  Post-op Assessment: Report given to RN and Post -op Vital signs reviewed and stable  Post vital signs: Reviewed and stable  Last Vitals:  Vitals:   04/08/16 0611 04/08/16 0617  BP: (!) 93/51 127/89  Pulse: 79   Resp: 18   Temp: 36.4 C     Last Pain:  Vitals:   04/08/16 0611  TempSrc: Oral      Patients Stated Pain Goal: 3 ([phone])  Complications: No apparent anesthesia complications

## 2016-04-08 NOTE — Brief Op Note (Signed)
04/08/2016  6:59 PM  PATIENT:  Dawn Hayes  45 y.o. female  PRE-OPERATIVE DIAGNOSIS:  Menorrhagia with irregular menses, anemia  POST-OPERATIVE DIAGNOSIS:  Menorrhagia with irregular menses, anemia  PROCEDURE:   ATTEMPTED TOTAL LAPAROSCOPIC HYSTERECTOMY WITH CONVERSION TO TOTAL ABDOMINAL HYSTERECTOMY WITH BILATERAL SALPINGECTOMY, LYSIS OF ADHESIONS, CYSTOSCOPY SURGEON:  Surgeon(s) and Role:    * Brook E Yisroel Ramming, MD - Primary    * Salvadore Dom, MD - Assisting  PHYSICIAN ASSISTANT:  NA  ASSISTANTS:  Sumner Boast, MD   ANESTHESIA:   local and general  EBL:  Total I/O In: [id number] [P.O.:400; I.V.:2500] Out: 1050 [Urine:800; Blood:250]  BLOOD ADMINISTERED:none  DRAINS: Urinary Catheter (Foley)   LOCAL MEDICATIONS USED:  Marcaine 0.25%  SPECIMEN:  Source of Specimen:  Uterus, cervix, bilateral tubes.  DISPOSITION OF SPECIMEN:  PATHOLOGY  COUNTS:  YES  TOURNIQUET:  * No tourniquets in log *  DICTATION: .Other Dictation: Dictation Number    PLAN OF CARE: Admit to inpatient   PATIENT DISPOSITION:  PACU - hemodynamically stable.   Delay start of Pharmacological VTE agent (>24hrs) due to surgical blood loss or risk of bleeding:  Yes.  Received Lovenox pre-op.

## 2016-04-08 NOTE — Anesthesia Preprocedure Evaluation (Addendum)
Anesthesia Evaluation  Patient identified by MRN, date of birth, ID band Patient awake    Reviewed: Allergy & Precautions, NPO status , Patient's Chart, lab work & pertinent test results  Airway Mallampati: II  TM Distance: >3 FB     Dental   Pulmonary Current Smoker,    breath sounds clear to auscultation       Cardiovascular hypertension,  Rhythm:Regular Rate:Normal     Neuro/Psych  Neuromuscular disease    GI/Hepatic hiatal hernia, GERD  ,  Endo/Other    Renal/GU      Musculoskeletal  (+) Arthritis ,   Abdominal   Peds  Hematology   Anesthesia Other Findings   Reproductive/Obstetrics                            Anesthesia Physical Anesthesia Plan  ASA: II  Anesthesia Plan: General   Post-op Pain Management:    Induction: Intravenous  Airway Management Planned: Oral ETT  Additional Equipment:   Intra-op Plan:   Post-operative Plan: Extubation in OR  Informed Consent: I have reviewed the patients History and Physical, chart, labs and discussed the procedure including the risks, benefits and alternatives for the proposed anesthesia with the patient or authorized representative who has indicated his/her understanding and acceptance.   Dental advisory given  Plan Discussed with:   Anesthesia Plan Comments:         Anesthesia Quick Evaluation

## 2016-04-09 ENCOUNTER — Encounter (HOSPITAL_COMMUNITY): Payer: Self-pay | Admitting: Obstetrics and Gynecology

## 2016-04-09 LAB — BASIC METABOLIC PANEL
Anion gap: 9 (ref 5–15)
BUN: 8 mg/dL (ref 6–20)
CALCIUM: 8.9 mg/dL (ref 8.9–10.3)
CO2: 24 mmol/L (ref 22–32)
CREATININE: 0.64 mg/dL (ref 0.44–1.00)
Chloride: 101 mmol/L (ref 101–111)
GFR calc Af Amer: >60 mL/min (ref >60)
GFR calc non Af Amer: >60 mL/min (ref >60)
GLUCOSE: 103 mg/dL — AB (ref 65–99)
Potassium: 3.7 mmol/L (ref 3.5–5.1)
Sodium: 134 mmol/L — ABNORMAL LOW (ref 135–145)

## 2016-04-09 LAB — CBC
HEMATOCRIT: 32.6 % — AB (ref 36.0–46.0)
Hemoglobin: 10.4 g/dL — ABNORMAL LOW (ref 12.0–15.0)
MCH: 28.7 pg (ref 26.0–34.0)
MCHC: 31.9 g/dL (ref 30.0–36.0)
MCV: 90.1 fL (ref 78.0–100.0)
Platelets: 229 10*3/uL (ref 150–400)
RBC: 3.62 MIL/uL — ABNORMAL LOW (ref 3.87–5.11)
RDW: 24.2 % — AB (ref 11.5–15.5)
WBC: 9.4 10*3/uL (ref 4.0–10.5)

## 2016-04-09 MED ORDER — DIPHENHYDRAMINE HCL 25 MG PO CAPS
25.0000 mg | ORAL_CAPSULE | Freq: Four times a day (QID) | ORAL | Status: DC | PRN
Start: 2016-04-09 — End: 2016-04-10
  Administered 2016-04-09 – 2016-04-10 (×2): 25 mg via ORAL
  Filled 2016-04-09 (×2): qty 1

## 2016-04-09 NOTE — Op Note (Addendum)
NAMEELY, BONIFAS NO.:  [id number]  MEDICAL RECORD NO.:  [id number]  LOCATION:  PERIO                         FACILITY:  [facility]  PHYSICIAN:  Lenard Galloway, M.D.   DATE OF BIRTH:  1971/04/19  DATE OF PROCEDURE:  04/08/2016 DATE OF DISCHARGE:                              OPERATIVE REPORT   PREOPERATIVE DIAGNOSIS:  Menorrhagia with irregular menses, anemia.  POSTOPERATIVE DIAGNOSIS:  Menorrhagia with irregular menses, anemia.  PROCEDURE:  An attempted total laparoscopic hysterectomy with conversion to total abdominal hysterectomy with bilateral salpingectomy, lysis of adhesions, and cystoscopy.  ASSISTANT:  Sumner Boast, MD.  ANESTHESIA:  General endotracheal, local with 0.25% Marcaine.  IV FLUIDS:  2900 mL Ringer's lactate.  ESTIMATED BLOOD LOSS:  250 mL.  URINE OUTPUT:  800 mL.  COMPLICATIONS:  None.  INDICATIONS FOR THE PROCEDURE:  The patient is a 45 year old gravida 0, para 0 Caucasian female, status post hysteroscopy with dilation and curettage on 01/04/2016 with a diagnosis of benign endometrial polyp and secretory endometrium performed for heavy and irregular vaginal bleeding, who developed recurrence of these symptoms.  The patient has a history of chronic anemia and has been receiving ferritin infusions. She declines medical therapy and she is requesting definitive therapy with hysterectomy.  She declined future childbearing.  FINDINGS:  Examination under anesthesia reveals a small anteverted mobile uterus. No adnexal masses are appreciated.  At the time of initial laparoscopy, there was evidence of approximately 50 mL of hemoperitoneum.  Laparoscopic exploration at that time did not reveal the site of the bleeding.  The pelvis and abdomen were suctioned and there did not appear to be any reaccumulation of blood at this time.  The uterus itself was unremarkable with the exception of a 1.5 cm cyst of the posterior fundal serosa.  The left  tube and ovary had adhesions to the left pelvic sidewall, but were otherwise unremarkable.  The right tube and ovary were normal.  In the upper abdomen, the liver was unremarkable.  Cystoscopy revealed a normal bladder throughout 360 degrees.  No foreign body was noted in the bladder or urethra.  The ureters were patent bilaterally.   SPECIMENS:  The bilateral fallopian tubes, uterine, fundus, and cervix were sent to Pathology together.  DESCRIPTION OF PROCEDURE:  The patient was re-identified in the preoperative holding area.  She received cefotetan 2 g IV for antibiotic prophylaxis.  The patient received PAS stockings and Lovenox for DVT prophylaxis.  In the operating room, the patient was placed in the dorsal lithotomy position with Allen stirrups and general endotracheal anesthesia was then induced.  The Trenguard system was used for support along the operative table.  The abdomen and vagina were sterilely prepped and the patient was sterilely draped.  The speculum was placed inside the vagina at this time and a single- tooth tenaculum was placed on the anterior cervical lip.  The uterus was sounded to 8 cm.  The cervix was then further dilated with [facility] dilators.  A figure-of-eight suture of 0 Vicryl was placed on each of the anterior and posterior cervical lips.  The RUMI device with a small KOH ring was then placed on the cervix and there  was a good application noted.  The Foley catheter was then placed inside the urinary bladder.  The vaginal speculum was removed prior to placement of the Foley catheter.  While I placed the vaginal instruments from below, my colleague placed the umbilical trocar at the right and left lower abdomen trocars.  The umbilicus was grasped with Adson clamps.  It was injected locally with 0.25% Marcaine.  A 5-mm incision was created and the 5 mm diagnostic laparoscope was placed using the Optiview inside the peritoneal cavity after a  saline drop test was successfully performed.  The laparoscope confirmed proper placement inside the peritoneal cavity.  the pneumoperitoneum was achieved at this time.  The patient was placed in the Trendelenburg position.  An 8 mm right lower quadrant incision was created after the skin was again injected locally with 0.25% Marcaine. Two 5 mm left lower abdominal trocars were then placed in parallel in the left lower quadrant again under visualization of the laparoscope and after local injection with 0.25% Marcaine.  An exploration of the pelvic and abdominal organs was performed and the findings were as noted above.  The Nezhat suction irrigator was used to remove hemoperitoneum within the peritoneal cavity.  There was no obvious site of bleeding noted.  Lysis of adhesions began in the left adnexal region using sharp dissection with a laparoscopic scissors.   The fallopian tube was freed at this point and the LigaSure device was used to perform a left salpingectomy.  This began at the distal fallopian tube and was carried toward the proximal fallopian tube.  The specimen was removed from the peritoneal cavity.  The LigaSure device was used to cauterize the utero-ovarian ligament and then bisected.  The same was performed with the left round ligament.  At this time, anesthesia announced that the patient was sliding on the operating room table up toward the head of the bed.  The patient was therefore taken out of the Trendelenburg position and was placed in reverse Trendelenburg and the operating room team repositioned the patient back in an appropriate position on the operating room table.  Anesthesia noted some potential bronchospasm.  Please refer to this dictation separately.  The patient stabilized and she  was placed back in the Trendelenburg position.   Visualization of the uterus was not adequate at this time and the patient was not tolerating the Trendelenburg position  and a decision was therefore made to convert to an open procedure.  The pneumoperitoneum was released and all of the laparoscopic instruments were removed.  A Pfannenstiel incision was created sharply with a scalpel.  The incision was carried down through the subcutaneous layer using monopolar cautery for hemostasis.  The fascia was then incised in the midline with the scalpel and the incision was extended bilaterally using a Mayo scissors.  The rectus muscles were dissected off the fascia superiorly and inferiorly.  The parietal peritoneum was elevated with 2 hemostat clamps and was entered sharply with the Metzenbaum scissors.  The peritoneal incision was extended cranially and caudally. There was some bleeding noted along the preperitoneal area of the bladder and a figure-of-eight suture of 2-0 Vicryl was placed in this region for good hemostasis.  An Alexis retractor was placed inside the peritoneal cavity at this time and then the bowel was packed into the upper abdomen using moistened lap pads.  The patient was carefully placed in Trendelenburg position at this time.  She was tolerating this well.  An exploration of the peritoneal cavity  was performed.  There was an area of oozing noted along the omentum and there appeared to be a very small defect and omentum.  It was thought that this was the most likely site of the cause of the small hemoperitoneum noted initially at the beginning of the case.  The area was cauterized well and hemostasis following this was good.  Long Kelly clamps were placed across the adnexal pedicles bilaterally. The right fallopian tube was identified and the mesosalpinx was clamped. Monopolar cautery was used to dissect the mesosalpinx.  The pedicle was then suture ligated with 0 Vicryl.  This was continued through to the proximal fallopian tube, which was then excised using monopolar cautery. This was sent to Pathology.  The right round ligament was  grasped and suture ligated with a transfixing suture of 0 Vicryl.  The ligament was then bisected using monopolar cautery.  The anterior and posterior leaves of the broad ligament were opened.  A window was created through the broad ligament and the utero-ovarian ligament could be clamped with a Heaney clamp, sharply bisected and sutured with 0 Vicryl suture.  Each of the uterine arteries were skeletonized bilaterally after the bladder flap was sharply taken down anteriorly.  The uterine fundus was sharply amputated from the cervix at this time and was set aside for pathologic analysis.  Continued dissection along the inferior aspects of the cardinal ligaments was performed at this time and the pedicles were clamped, sharply divided, and suture ligated with 0 Vicryl bilaterally. Eventually, the left and right uterosacral ligaments could be clamped with curved Haney clamps, sharply dissected, and then suture ligated again with 0 Vicryl suture.  The cervix was then circumscribed from the vaginal cuff and the cervix was sent to Pathology along with the rest of the specimen.  The vaginal cuff was closed with a combination of figure-of-eight sutures of 0 Vicryl and also simple suture of 0 Vicryl in the mid vaginal cuff.  The pelvis was irrigated and suctioned at this time.  There was some bleeding noted along the right side just lateral to the vaginal cuff closure.  This area was gently elevated with an Allis clamp and was then suture ligated with a figure-of-eight suture of 0 Vicryl.  Hemostasis was good at this time.  The pelvis was irrigated and suctioned.  All operative sites were examined and there was good hemostasis.  The upper abdomen was also hemostatic.  The lap pads and the self-retaining retractor were removed at this time. Again, the upper abdomen was explored and there was no evidence of bleeding.  The abdominal wall was evaluated at this time and there were  some perforating blood vessels along the rectus fascia and rectus muscles, which were oozing and these responded well to monopolar cautery. Hemostasis was then excellent.  The fascia was closed with a running suture of 0 Vicryl bilaterally. The subcutaneous tissue was irrigated and suctioned and made hemostatic with monopolar cautery.  Interrupted sutures of 2-0 plain were placed in the subcutaneous layer and the skin was closed with a subcuticular closure of 4-0 Vicryl.  Dermabond was placed over all of the incisions.  The Foley catheter was removed and cystoscopy was performed and the Findings are as noted above.  The bladder was drained of all cystoscopic fluid and a Foley catheter was replaced and left to gravity drainage.  The patient was awakened and extubated and escorted to the recovery room in stable and awake condition.  There were no complications to  the procedure.  All needle, instrument, and sponge counts were correct.     Lenard Galloway, M.D.     BES/MEDQ  D:  04/08/2016  T:  04/09/2016  Job:  [id number]

## 2016-04-09 NOTE — Progress Notes (Addendum)
1 Day Post-Op Procedure(s) (LRB): ATTEMPTED HYSTERECTOMY TOTAL LAPAROSCOPIC (N/A) LAPAROSCOPIC BILATERAL SALPINGECTOMY possible BSO (N/A) CYSTOSCOPY (N/A) HYSTERECTOMY ABDOMINAL WITH SALPINGECTOMY (Bilateral) LAPAROSCOPIC LYSIS OF ADHESIONS  Subjective: Patient reports no problems voiding.   Itching with Dilaudid.  Ambulated.  No flatus.   Objective: I have reviewed patient's vital signs, intake and output and labs. Vitals:   04/09/16 0407 04/09/16 0623  BP: 111/73   Pulse: 97   Resp: 16 20  Temp: 97.7 F (36.5 C)    I/0 - 3405 cc/1550 cc.  General: alert and cooperative Resp: clear to auscultation bilaterally Cardio: regular rate and rhythm, S1, S2 normal, no murmur, click, rub or gallop GI: soft, non-tender; bowel sounds normal; no masses,  no organomegaly and incision: clean, dry and intact Vaginal Bleeding: none  Assessment: s/p Procedure(s): ATTEMPTED HYSTERECTOMY TOTAL LAPAROSCOPIC (N/A) LAPAROSCOPIC BILATERAL SALPINGECTOMY possible BSO (N/A) CYSTOSCOPY (N/A) HYSTERECTOMY ABDOMINAL WITH SALPINGECTOMY (Bilateral) LAPAROSCOPIC LYSIS OF ADHESIONS: progressing well  Plan: Advance diet Encourage ambulation Advance to PO medication CBC in am tomorrow.  Surgical findings and procedure again reviewed with patient.    LOS: 1 day    Dawn Hayes Matthew Saras 04/09/2016, 7:53 AM

## 2016-04-10 LAB — CBC
HCT: 30.4 % — ABNORMAL LOW (ref 36.0–46.0)
HEMATOCRIT: 30.1 % — AB (ref 36.0–46.0)
HEMOGLOBIN: 9.7 g/dL — AB (ref 12.0–15.0)
Hemoglobin: 10 g/dL — ABNORMAL LOW (ref 12.0–15.0)
MCH: 29.1 pg (ref 26.0–34.0)
MCH: 29.8 pg (ref 26.0–34.0)
MCHC: 31.9 g/dL (ref 30.0–36.0)
MCHC: 33.2 g/dL (ref 30.0–36.0)
MCV: 91.3 fL (ref 78.0–100.0)
MCV: 89.6 fL (ref 78.0–100.0)
PLATELETS: 208 10*3/uL (ref 150–400)
Platelets: 236 10*3/uL (ref 150–400)
RBC: 3.33 MIL/uL — AB (ref 3.87–5.11)
RBC: 3.36 MIL/uL — ABNORMAL LOW (ref 3.87–5.11)
RDW: 24.7 % — ABNORMAL HIGH (ref 11.5–15.5)
RDW: 24.5 % — AB (ref 11.5–15.5)
WBC: 7.5 10*3/uL (ref 4.0–10.5)
WBC: 10 10*3/uL (ref 4.0–10.5)

## 2016-04-10 MED ORDER — TRAMADOL HCL 50 MG PO TABS
50.0000 mg | ORAL_TABLET | Freq: Four times a day (QID) | ORAL | Status: DC | PRN
  Administered 2016-04-10 (×2): 50 mg via ORAL
  Filled 2016-04-10 (×2): qty 1

## 2016-04-10 MED ORDER — TRAMADOL HCL 50 MG PO TABS: 50.0000 mg | ORAL_TABLET | Freq: Four times a day (QID) | ORAL | 0 refills | Status: DC | PRN

## 2016-04-10 MED ORDER — DIPHENHYDRAMINE HCL 25 MG PO CAPS: 25.0000 mg | ORAL_CAPSULE | Freq: Four times a day (QID) | ORAL | 0 refills | Status: DC | PRN

## 2016-04-10 NOTE — Progress Notes (Signed)
Notified Dr. Quincy Simmonds of pts improved pain level after taking second tramadol. D/c order received. Marry Guan

## 2016-04-10 NOTE — Progress Notes (Signed)
Pt was escorted to the main entrance via wheelchair. NTs escorted her out. A friend is driving her home and met at the main entrance. Pt verbalized understanding of d/c instructions, medications, follow up appts, when to seek medical attention, and belongings policy. No questions at this time. No IV at this time. Dawn Hayes

## 2016-04-10 NOTE — Progress Notes (Signed)
2 Days Post-Op Procedure(s) (LRB): ATTEMPTED HYSTERECTOMY TOTAL LAPAROSCOPIC (N/A) LAPAROSCOPIC BILATERAL SALPINGECTOMY possible BSO (N/A) CYSTOSCOPY (N/A) HYSTERECTOMY ABDOMINAL WITH SALPINGECTOMY (Bilateral) LAPAROSCOPIC LYSIS OF ADHESIONS  Subjective: Patient reports + flatus.   Tolerating regular diet.  Still with pruritis.  On Percocet.  Taking Benadryl but this makes her sleepy.  Slight dizziness.   Objective: I have reviewed patient's vital signs, intake and output and labs. Vitals:   04/09/16 1636 04/09/16 1936  BP: (!) 108/56 (!) 106/52  Pulse: 88 86  Resp: 18 18  Temp: 98.5 F (36.9 C) 98.7 F (37.1 C)   Hgb 9.7.  General: alert and cooperative Resp: clear to auscultation bilaterally Cardio: regular rate and rhythm, S1, S2 normal, no murmur, click, rub or gallop GI: soft, non-tender; bowel sounds normal; no masses,  no organomegaly and incision: clean, dry and intact Vaginal Bleeding: none  Assessment: s/p Procedure(s): ATTEMPTED HYSTERECTOMY TOTAL LAPAROSCOPIC (N/A) LAPAROSCOPIC BILATERAL SALPINGECTOMY possible BSO (N/A) CYSTOSCOPY (N/A) HYSTERECTOMY ABDOMINAL WITH SALPINGECTOMY (Bilateral) LAPAROSCOPIC LYSIS OF ADHESIONS: anemia  Plan: observe during the morning.   Push po fluids.  Switch to Tramadol.  Check CBC at noon.  If stable, will discharge to home after lunch today.  Instructions and precautions already reviewed.   LOS: 2 days    [name] 04/10/2016, 8:03 AM

## 2016-04-10 NOTE — Discharge Instructions (Signed)
Abdominal Hysterectomy, Care After Refer to this sheet in the next few weeks. These instructions provide you with information on caring for yourself after your procedure. Your health care provider may also give you more specific instructions. Your treatment has been planned according to current medical practices, but problems sometimes occur. Call your health care provider if you have any problems or questions after your procedure. What can I expect after the procedure? After your procedure, it is typical to have the following:  Pain.  Feeling tired.  Poor appetite.  Less interest in sex. It takes 4-6 weeks to recover from this surgery. Follow these instructions at home:  Take pain medicines only as directed by your health care provider. Do not take over-the-counter pain medicines without checking with your health care provider first.  Change your bandage as directed by your health care provider.  Return to your health care provider to have your sutures taken out.  Take showers instead of baths for 2-3 weeks. Ask your health care provider when it is safe to start showering.  Do not douche, use tampons, or have sexual intercourse for at least 6 weeks or until your health care provider says you can.  Follow your health care provider's advice about exercise, lifting, driving, and general activities.  Get plenty of rest and sleep.  Do not lift anything heavier than a gallon of milk (about 10 lb [4.5 kg]) for the first month after surgery.  You can resume your normal diet if your health care provider says it is okay.  Do not drink alcohol until your health care provider says you can.  If you are constipated, ask your health care provider if you can take a mild laxative.  Eating foods high in fiber may also help with constipation. Eat plenty of raw fruits and vegetables, whole grains, and beans.  Drink enough fluids to keep your urine clear or pale yellow.  Try to have someone at  home with you for the first 1-2 weeks to help around the house.  Keep all follow-up appointments. Contact a health care provider if:  You have chills or fever.  You have swelling, redness, or pain in the area of your incision that is getting worse.  You have pus coming from the incision.  You notice a bad smell coming from the incision or bandage.  Your incision breaks open.  You feel dizzy or light-headed.  You have pain or bleeding when you urinate.  You have persistent diarrhea.  You have persistent nausea and vomiting.  You have abnormal vaginal discharge.  You have a rash.  You have any type of abnormal reaction or develop an allergy to your medicine.  Your pain medicine is not helping. Get help right away if:  You have a fever and your symptoms suddenly get worse.  You have severe abdominal pain.  You have chest pain.  You have shortness of breath.  You faint.  You have pain, swelling, or redness of your leg.  You have heavy vaginal bleeding with blood clots. This information is not intended to replace advice given to you by your health care provider. Make sure you discuss any questions you have with your health care provider. Document Released: 11/15/2004 Document Revised: 10/10/2015 Document Reviewed: 02/18/2013 Elsevier Interactive Patient Education  2017 [employer].

## 2016-04-14 ENCOUNTER — Ambulatory Visit: Payer: Commercial Managed Care - HMO | Admitting: Obstetrics and Gynecology

## 2016-04-14 ENCOUNTER — Ambulatory Visit (INDEPENDENT_AMBULATORY_CARE_PROVIDER_SITE_OTHER): Payer: Commercial Managed Care - HMO | Admitting: Obstetrics and Gynecology

## 2016-04-14 ENCOUNTER — Encounter: Payer: Self-pay | Admitting: Obstetrics and Gynecology

## 2016-04-14 VITALS — BP 126/78 | HR 80 | Ht 62.0 in | Wt 269.0 lb

## 2016-04-14 DIAGNOSIS — D649 Anemia, unspecified: Secondary | ICD-10-CM

## 2016-04-14 DIAGNOSIS — Z9889 Other specified postprocedural states: Secondary | ICD-10-CM

## 2016-04-14 NOTE — Patient Instructions (Signed)
Start the Lisinopril if your blood pressure reaches 130/80 or higher.

## 2016-04-14 NOTE — Progress Notes (Signed)
GYNECOLOGY  VISIT   HPI: 45 y.o.   Married  Caucasian  female   G0P0 with Patient's last menstrual period was 02/28/2016.   here for 1 week follow up [place] (N/A Abdomen) [id number] CPT (R)] LAPAROSCOPIC BILATERAL SALPINGECTOMY possible BSO (N/A Abdomen) CYSTOSCOPY (N/A Urethra) HYSTERECTOMY ABDOMINAL WITH SALPINGECTOMY (Bilateral Abdomen) LAPAROSCOPIC LYSIS OF ADHESIONS.  Some incisional burning.  Taking Tramadol yesterday, none today.  Taking Motrin.  Able to eat.  Diminished appetite.  No vaginal bleeding.  Some incisional drainage from laparoscopic incisions, not Pfannenstiel. Normal BMs.  Voiding well.   Hgb 10.0 at discharge.   Final pathology - Benign polyps, fibroids.  Normal cervix and tubes.   Hgb 10.2 today in office.     GYNECOLOGIC HISTORY: Patient's last menstrual period was 02/28/2016. Contraception:  Hysterectomy Menopausal hormone therapy:  none Last mammogram:  06-21-14 Density A/Neg/BiRads[facility]  Last pap smear:  03-17-16 Neg:Neg HR HPV        OB History    Gravida Para Term Preterm AB Living   0             SAB TAB Ectopic Multiple Live Births                     Patient Active Problem List   Diagnosis Date Noted  . Status post total abdominal hysterectomy 04/08/2016  . Costochondritis 12/04/2015  . Maxillary sinusitis, acute 12/04/2015  . Anemia, iron deficiency 10/10/2015  . Tick bites 10/10/2015  . Fatigue 10/10/2015  . Allergic rhinitis 09/07/2015  . Hemorrhoid   . Diverticulosis of colon without hemorrhage   . Hiatal hernia   . Anemia 05/08/2015  . Rectal bleeding 05/08/2015  . Chest pain, rule out acute myocardial infarction 09/27/2013  . Abnormal finding on EKG 09/16/2013  . Psoriasis 09/16/2013  . Psoriatic arthritis ([place]) 09/16/2013  . HTN (hypertension) 09/16/2013  . Morbid obesity- BMI 47 09/16/2013  . GERD 12/11/2009  . DIARRHEA, CHRONIC 12/11/2009    Past Medical History:   Diagnosis Date  . Anemia   . Arthritis   . Costochondritis   . Diverticulitis   . Dysmenorrhea   . GERD (gastroesophageal reflux disease)   . Headache    Migraines  . History of hiatal hernia   . Hypertension   . Menorrhagia   . Psoriatic arthritis ([place])   . Smoker     Past Surgical History:  Procedure Laterality Date  . CARPAL TUNNEL RELEASE Left 01/19/2015   Procedure: LEFT CARPAL TUNNEL RELEASE;  Surgeon: Roseanne Kaufman, MD;  Location: [facility];  Service: Orthopedics;  Laterality: Left;  . CHOLECYSTECTOMY    . CHOLECYSTECTOMY, LAPAROSCOPIC  2006  . COLONOSCOPY  1998   normal, [place]  . COLONOSCOPY N/A 05/30/2015   RMR: Minimal anal canal hemorrhoids. colonic diverticulosis. I suspect relatively trivial recent gI Bleed likely related to hemorrhoids. This finding alone would not likely adequatly explain her degree of anemia. she likely has a significent component from menstrual losses. , etc.   . CYSTOSCOPY N/A 04/08/2016   Procedure: Consuela Mimes;  Surgeon: Nunzio Cobbs, MD;  Location: [facility];  Service: Gynecology;  Laterality: N/A;  . DILATATION & CURETTAGE/HYSTEROSCOPY WITH MYOSURE N/A 01/04/2016   Procedure: DILATATION & CURETTAGE/HYSTEROSCOPY;  Surgeon: Nunzio Cobbs, MD;  Location: [facility];  Service: Gynecology;  Laterality: N/A;  . ESOPHAGOGASTRODUODENOSCOPY  09/11/99   tiny esophageal erosions with mild erosive reflux/no barrett's/normal stomach  .  ESOPHAGOGASTRODUODENOSCOPY N/A 05/30/2015   RMR: Hiatal hernia otherwise negative EGD  . HYSTERECTOMY ABDOMINAL WITH SALPINGECTOMY Bilateral 04/08/2016   Procedure: HYSTERECTOMY ABDOMINAL WITH SALPINGECTOMY;  Surgeon: Nunzio Cobbs, MD;  Location: [facility];  Service: Gynecology;  Laterality: Bilateral;  . LAPAROSCOPIC BILATERAL SALPINGECTOMY N/A 04/08/2016   Procedure: LAPAROSCOPIC BILATERAL SALPINGECTOMY possible BSO;  Surgeon: Nunzio Cobbs, MD;  Location: [facility];   Service: Gynecology;  Laterality: N/A;  . LAPAROSCOPIC HYSTERECTOMY N/A 04/08/2016   Procedure: ATTEMPTED HYSTERECTOMY TOTAL LAPAROSCOPIC;  Surgeon: Nunzio Cobbs, MD;  Location: [facility];  Service: Gynecology;  Laterality: N/A;  . LAPAROSCOPIC LYSIS OF ADHESIONS  04/08/2016   Procedure: LAPAROSCOPIC LYSIS OF ADHESIONS;  Surgeon: Nunzio Cobbs, MD;  Location: [facility];  Service: Gynecology;;    Current Outpatient Prescriptions  Medication Sig Dispense Refill  . clobetasol (TEMOVATE) 0.05 % GEL Apply 1 application topically 2 (two) times daily as needed (for itching/irritation).     . diphenhydrAMINE (BENADRYL) 25 mg capsule Take 1 capsule (25 mg total) by mouth every 6 (six) hours as needed for itching. 30 capsule 0  . esomeprazole (NEXIUM) 40 MG capsule Take 40 mg by mouth daily before breakfast.    . hydrochlorothiazide (HYDRODIURIL) 25 MG tablet Take 25 mg by mouth daily.    Marland Kitchen ibuprofen (ADVIL,MOTRIN) 800 MG tablet Take 800 mg by mouth every 8 (eight) hours as needed for mild pain or moderate pain.    Marland Kitchen lactobacillus acidophilus (BACID) TABS tablet Take 1 tablet by mouth daily.    . Multiple Vitamin (MULTIVITAMIN WITH MINERALS) TABS tablet Take 1 tablet by mouth daily.    Marland Kitchen nystatin (MYCOSTATIN) 100000 UNIT/ML suspension Take 5 mLs by mouth 4 (four) times daily as needed (for thrush). Pt is to swish and spit.    . Pyridoxine HCl (VITAMIN B-6) 500 MG tablet Take 1,000 mg by mouth daily.    . traMADol (ULTRAM) 50 MG tablet Take 1 tablet (50 mg total) by mouth every 6 (six) hours as needed for moderate pain. 30 tablet 0  . ENBREL SURECLICK 50 MG/ML injection     . lisinopril (PRINIVIL,ZESTRIL) 10 MG tablet Take 1 tablet by mouth daily.     No current facility-administered medications for this visit.      ALLERGIES: Aspirin; Toradol [ketorolac tromethamine]; and Adhesive [tape]  Family History  Problem Relation Age of Onset  . Brain cancer Father   . Prostate cancer  Father   . Hyperlipidemia Mother   . Colon cancer Maternal Grandmother 57  . Stroke Paternal Grandfather     COD  . Aneurysm Maternal Grandfather     Social History   Social History  . Marital status: Married    Spouse name: N/A  . Number of children: 0  . Years of education: N/A   Occupational History  . [employer]    Social History Main Topics  . Smoking status: Current Every Day Smoker    Packs/day: 0.10    Years: 20.00    Types: Cigarettes  . Smokeless tobacco: Never Used     Comment: smoking 5 cigarettes a day, hasn't quit completely   . Alcohol use 9.6 oz/week    6 Glasses of wine, 10 Standard drinks or equivalent per week     Comment: socially, weekends 4-5  . Drug use: No  . Sexual activity: Yes    Partners: Male    Birth control/ protection: None  Other Topics Concern  . Not on file   Social History Narrative  . No narrative on file    ROS:  Pertinent items are noted in HPI.  PHYSICAL EXAMINATION:    BP 126/78 (BP Location: Right Arm, Patient Position: Sitting, Cuff Size: Large)   Pulse 80   Ht 5\' 2"  (1.575 m)   Wt 269 lb (122 kg)   LMP 02/28/2016   BMI 49.20 kg/m     General appearance: alert, cooperative and appears stated age   Abdomen: Incisions intact with without herniation, soft, non-tender, no masses,  no organomegaly  ASSESSMENT  Status post attempted laparoscopic TLH with conversion to abdominal hysterectomy with bilateral salpingectomy/cystoscopy.  Doing well post op. Anemia. Stable and improving. HTN.   PLAN  Ok to continue Fe po bid.  Restart Lisinopril if BP increases to 130/80. Follow up for 6 week post op visit.   An After Visit Summary was printed and given to the patient.

## 2016-04-16 ENCOUNTER — Ambulatory Visit (INDEPENDENT_AMBULATORY_CARE_PROVIDER_SITE_OTHER): Payer: Commercial Managed Care - HMO | Admitting: Physician Assistant

## 2016-04-16 ENCOUNTER — Emergency Department (HOSPITAL_COMMUNITY): Payer: Commercial Managed Care - HMO

## 2016-04-16 ENCOUNTER — Emergency Department (HOSPITAL_COMMUNITY)
Admission: EM | Admit: 2016-04-16 | Discharge: 2016-04-16 | Disposition: A | Discharge status: Home / Self Care | Payer: Commercial Managed Care - HMO | Attending: Emergency Medicine | Admitting: Emergency Medicine

## 2016-04-16 ENCOUNTER — Encounter: Payer: Self-pay | Admitting: Physician Assistant

## 2016-04-16 ENCOUNTER — Telehealth: Payer: Self-pay | Admitting: Family Medicine

## 2016-04-16 ENCOUNTER — Ambulatory Visit: Payer: Commercial Managed Care - HMO | Admitting: Obstetrics and Gynecology

## 2016-04-16 ENCOUNTER — Encounter (HOSPITAL_COMMUNITY): Payer: Self-pay

## 2016-04-16 VITALS — BP 118/60 | HR 95 | Temp 98.2°F | Resp 16 | Ht 62.0 in | Wt 266.0 lb

## 2016-04-16 DIAGNOSIS — R0789 Other chest pain: Secondary | ICD-10-CM | POA: Diagnosis not present

## 2016-04-16 DIAGNOSIS — R0602 Shortness of breath: Secondary | ICD-10-CM | POA: Diagnosis present

## 2016-04-16 DIAGNOSIS — I10 Essential (primary) hypertension: Secondary | ICD-10-CM | POA: Insufficient documentation

## 2016-04-16 DIAGNOSIS — R079 Chest pain, unspecified: Secondary | ICD-10-CM

## 2016-04-16 DIAGNOSIS — F17210 Nicotine dependence, cigarettes, uncomplicated: Secondary | ICD-10-CM | POA: Diagnosis not present

## 2016-04-16 DIAGNOSIS — Z79899 Other long term (current) drug therapy: Secondary | ICD-10-CM | POA: Diagnosis not present

## 2016-04-16 DIAGNOSIS — R071 Chest pain on breathing: Secondary | ICD-10-CM | POA: Insufficient documentation

## 2016-04-16 LAB — BASIC METABOLIC PANEL
Anion gap: 10 (ref 5–15)
BUN: 9 mg/dL (ref 6–20)
CALCIUM: 9.7 mg/dL (ref 8.9–10.3)
CO2: 26 mmol/L (ref 22–32)
CREATININE: 0.65 mg/dL (ref 0.44–1.00)
Chloride: 101 mmol/L (ref 101–111)
GFR calc non Af Amer: >60 mL/min (ref >60)
Glucose, Bld: 114 mg/dL — ABNORMAL HIGH (ref 65–99)
Potassium: 3.8 mmol/L (ref 3.5–5.1)
SODIUM: 137 mmol/L (ref 135–145)

## 2016-04-16 LAB — CBC
HCT: 35.5 % — ABNORMAL LOW (ref 36.0–46.0)
Hemoglobin: 11.4 g/dL — ABNORMAL LOW (ref 12.0–15.0)
MCH: 29.1 pg (ref 26.0–34.0)
MCHC: 32.1 g/dL (ref 30.0–36.0)
MCV: 90.6 fL (ref 78.0–100.0)
PLATELETS: 371 10*3/uL (ref 150–400)
RBC: 3.92 MIL/uL (ref 3.87–5.11)
RDW: 23.9 % — AB (ref 11.5–15.5)
WBC: 10.1 10*3/uL (ref 4.0–10.5)

## 2016-04-16 LAB — BRAIN NATRIURETIC PEPTIDE: B NATRIURETIC PEPTIDE 5: 30.9 pg/mL (ref 0.0–100.0)

## 2016-04-16 LAB — I-STAT TROPONIN, ED
TROPONIN I, POC: 0 ng/mL (ref 0.00–0.08)
TROPONIN I, POC: 0 ng/mL (ref 0.00–0.08)

## 2016-04-16 MED ORDER — SODIUM CHLORIDE 0.9 % IV BOLUS (SEPSIS)
1000.0000 mL | Freq: Once | INTRAVENOUS | Status: AC
  Administered 2016-04-16: 1000 mL via INTRAVENOUS

## 2016-04-16 MED ORDER — OXYCODONE-ACETAMINOPHEN 5-325 MG PO TABS: 1.0000 | ORAL_TABLET | Freq: Four times a day (QID) | ORAL | 0 refills | Status: DC | PRN

## 2016-04-16 MED ORDER — ONDANSETRON 4 MG PO TBDP
4.0000 mg | ORAL_TABLET | Freq: Once | ORAL | Status: AC
  Administered 2016-04-16: 4 mg via ORAL
  Filled 2016-04-16: qty 1

## 2016-04-16 MED ORDER — CYCLOBENZAPRINE HCL 10 MG PO TABS: 10.0000 mg | ORAL_TABLET | Freq: Two times a day (BID) | ORAL | 0 refills | Status: DC | PRN

## 2016-04-16 MED ORDER — HYDROMORPHONE HCL 2 MG/ML IJ SOLN
1.0000 mg | Freq: Once | INTRAMUSCULAR | Status: AC
  Administered 2016-04-16: 1 mg via INTRAVENOUS
  Filled 2016-04-16: qty 1

## 2016-04-16 MED ORDER — GI COCKTAIL ~~LOC~~
30.0000 mL | Freq: Once | ORAL | Status: AC
  Administered 2016-04-16: 30 mL via ORAL
  Filled 2016-04-16: qty 30

## 2016-04-16 MED ORDER — PREDNISONE 20 MG PO TABS
30.0000 mg | ORAL_TABLET | Freq: Once | ORAL | Status: AC
  Administered 2016-04-16: 30 mg via ORAL
  Filled 2016-04-16: qty 2

## 2016-04-16 MED ORDER — IOPAMIDOL (ISOVUE-370) INJECTION 76%
INTRAVENOUS | Status: AC
  Administered 2016-04-16: 100 mL
  Filled 2016-04-16: qty 100

## 2016-04-16 MED ORDER — ONDANSETRON HCL 4 MG PO TABS
4.0000 mg | ORAL_TABLET | Freq: Three times a day (TID) | ORAL | 0 refills | Status: DC | PRN
Start: 2016-04-16 — End: 2016-05-19

## 2016-04-16 MED ORDER — IOPAMIDOL (ISOVUE-370) INJECTION 76%
INTRAVENOUS | Status: AC
  Filled 2016-04-16: qty 100

## 2016-04-16 MED ORDER — LORAZEPAM 2 MG/ML IJ SOLN
1.0000 mg | Freq: Once | INTRAMUSCULAR | Status: AC
  Administered 2016-04-16: 1 mg via INTRAVENOUS
  Filled 2016-04-16: qty 1

## 2016-04-16 NOTE — Telephone Encounter (Signed)
Spoke with patients husband, Lennette Bihari. Patient reports flank and chest pain, states she feels that her symptoms are a result of "costochondritis flare up". Husband states she has experienced this several times in the past. Patient was advised by [facility] to report to Emergency Department, patient refuses. Patient requesting an appointment and prescription for prednisone which has helped in the past. Patient scheduled to see Elyn Aquas, PA-C today.

## 2016-04-16 NOTE — Patient Instructions (Signed)
Patient tiraged to the ER.

## 2016-04-16 NOTE — ED Notes (Signed)
Patient ambulated to restroom with steady gait. Denies SOB at this time, states that it improved when the pain decreased.

## 2016-04-16 NOTE — ED Triage Notes (Signed)
Patient complains of shortness of breath and pain with inspiration since last night. States that it hurst to breath, had abdominal hysterectomy 3 days ago, hyperventilating on arrival, no CP

## 2016-04-16 NOTE — Telephone Encounter (Signed)
Patient Name: Dawn Hayes  DOB: 09-Feb-1971    Initial Comment caller states she has flank pain   Nurse Assessment  Nurse: Raphael Gibney, RN, Vanita Ingles Date/Time (Eastern Time): 04/16/2016 9:15:04 AM  Confirm and document reason for call. If symptomatic, describe symptoms. ---Caller states she is having flank pain on both sides and is in the middle of chest. Pain started during the night. Pain level 8. She is groaning with pain.  Does the patient have any new or worsening symptoms? ---Yes  Will a triage be completed? ---Yes  Related visit to physician within the last 2 weeks? ---No  Does the PT have any chronic conditions? (i.e. diabetes, asthma, etc.) ---Yes  List chronic conditions. ---costochondritis  Is the patient pregnant or possibly pregnant? (Ask all females between the ages of 37-55) ---No  Is this a behavioral health or substance abuse call? ---No     Guidelines    Guideline Title Affirmed Question Affirmed Notes  Chest Pain Difficulty breathing    Final Disposition User   Go to ED Now Raphael Gibney, RN, Vanita Ingles    Comments  pt states she does not want to go to the ER but wants to come to the office.  called back line and spoke to [place] and gave report that pt has history of costochondritis; is having severe flank pain and pain in the middle of her chest. Has some difficulty breathing. Triage outcome of go to ER now. Pt wants to make MD appt.   Referrals  GO TO FACILITY REFUSED   Disagree/Comply: Disagree  Disagree/Comply Reason: Disagree with instructions

## 2016-04-16 NOTE — Progress Notes (Signed)
Pre visit review using our clinic review tool, if applicable. No additional management support is needed unless otherwise documented below in the visit note. 

## 2016-04-16 NOTE — ED Notes (Signed)
CT brought patient back, stating she could not tolerate lying flat. MD made aware, see new orders.

## 2016-04-16 NOTE — ED Provider Notes (Signed)
[facility] DEPT Provider Note   CSN: [id number] Arrival date & time: 04/16/16  1256     History   Chief Complaint Chief Complaint  Patient presents with  . Shortness of Breath    HPI Dawn Hayes is a 45 y.o. female with a past medical history significant for recurrent costochondritis, psoriatic arthritis, hypertension, GERD, and recent hysterectomy surgery who presents with chest pain and shortness of breath. Patient is committed by her husband who report that last night, the patient began having gradual onset severe shortness of breath and pleuritic chest pain. They were instructed to come to the ED to look for pulmonary embolism given recent surgery. They report that the patient has had 6 prior episodes of costochondritis feel similar however, in the setting of recent surgery they were concerned about clot versus pneumonia versus other abnormality. Patient reports the pain is exquisitely pleuritic and she cannot take deep breaths. Patient denies any diaphoresis, nausea, vomiting, conservation, diarrhea, dysuria. Patient has no abdominal pain in the area of the surgical site. Patient has no back pain. Patient describes the pain as 11 out of 10 in her central chest. It is nonradiating. It is very sharp.     The history is provided by the patient and the spouse. No language interpreter was used.  Chest Pain   This is a recurrent problem. The current episode started yesterday. The problem occurs constantly. The problem has been gradually worsening. The pain is associated with breathing. The pain is present in the substernal region. The pain is at a severity of 10/10. The pain is severe. The quality of the pain is described as pleuritic and sharp. The pain does not radiate. Associated symptoms include shortness of breath. Pertinent negatives include no abdominal pain, no back pain, no cough, no diaphoresis, no dizziness, no exertional chest pressure, no fever, no headaches, no leg pain,  no lower extremity edema, no malaise/fatigue, no nausea, no near-syncope, no numbness, no palpitations, no sputum production, no syncope, no vomiting and no weakness. She has tried nothing for the symptoms. Risk factors include obesity. Past medical history comments: recent surgery      Past Medical History:  Diagnosis Date  . Anemia   . Arthritis   . Costochondritis   . Diverticulitis   . Dysmenorrhea   . GERD (gastroesophageal reflux disease)   . Headache    Migraines  . History of hiatal hernia   . Hypertension   . Menorrhagia   . Psoriatic arthritis ([place])   . Smoker     Patient Active Problem List   Diagnosis Date Noted  . Status post total abdominal hysterectomy 04/08/2016  . Costochondritis 12/04/2015  . Maxillary sinusitis, acute 12/04/2015  . Anemia, iron deficiency 10/10/2015  . Tick bites 10/10/2015  . Fatigue 10/10/2015  . Allergic rhinitis 09/07/2015  . Hemorrhoid   . Diverticulosis of colon without hemorrhage   . Hiatal hernia   . Anemia 05/08/2015  . Rectal bleeding 05/08/2015  . Chest pain, rule out acute myocardial infarction 09/27/2013  . Abnormal finding on EKG 09/16/2013  . Psoriasis 09/16/2013  . Psoriatic arthritis ([place]) 09/16/2013  . HTN (hypertension) 09/16/2013  . Morbid obesity- BMI 47 09/16/2013  . GERD 12/11/2009  . DIARRHEA, CHRONIC 12/11/2009    Past Surgical History:  Procedure Laterality Date  . CARPAL TUNNEL RELEASE Left 01/19/2015   Procedure: LEFT CARPAL TUNNEL RELEASE;  Surgeon: Roseanne Kaufman, MD;  Location: [facility];  Service: Orthopedics;  Laterality: Left;  .  CHOLECYSTECTOMY    . CHOLECYSTECTOMY, LAPAROSCOPIC  2006  . COLONOSCOPY  1998   normal, [facility]  . COLONOSCOPY N/A 05/30/2015   RMR: Minimal anal canal hemorrhoids. colonic diverticulosis. I suspect relatively trivial recent gI Bleed likely related to hemorrhoids. This finding alone would not likely adequatly explain her degree of anemia. she likely  has a significent component from menstrual losses. , etc.   . CYSTOSCOPY N/A 04/08/2016   Procedure: Consuela Mimes;  Surgeon: Nunzio Cobbs, MD;  Location: [facility];  Service: Gynecology;  Laterality: N/A;  . DILATATION & CURETTAGE/HYSTEROSCOPY WITH MYOSURE N/A 01/04/2016   Procedure: DILATATION & CURETTAGE/HYSTEROSCOPY;  Surgeon: Nunzio Cobbs, MD;  Location: [facility];  Service: Gynecology;  Laterality: N/A;  . ESOPHAGOGASTRODUODENOSCOPY  09/11/99   tiny esophageal erosions with mild erosive reflux/no barrett's/normal stomach  . ESOPHAGOGASTRODUODENOSCOPY N/A 05/30/2015   RMR: Hiatal hernia otherwise negative EGD  . HYSTERECTOMY ABDOMINAL WITH SALPINGECTOMY Bilateral 04/08/2016   Procedure: HYSTERECTOMY ABDOMINAL WITH SALPINGECTOMY;  Surgeon: Nunzio Cobbs, MD;  Location: [facility];  Service: Gynecology;  Laterality: Bilateral;  . LAPAROSCOPIC BILATERAL SALPINGECTOMY N/A 04/08/2016   Procedure: LAPAROSCOPIC BILATERAL SALPINGECTOMY possible BSO;  Surgeon: Nunzio Cobbs, MD;  Location: [facility];  Service: Gynecology;  Laterality: N/A;  . LAPAROSCOPIC HYSTERECTOMY N/A 04/08/2016   Procedure: ATTEMPTED HYSTERECTOMY TOTAL LAPAROSCOPIC;  Surgeon: Nunzio Cobbs, MD;  Location: [facility];  Service: Gynecology;  Laterality: N/A;  . LAPAROSCOPIC LYSIS OF ADHESIONS  04/08/2016   Procedure: LAPAROSCOPIC LYSIS OF ADHESIONS;  Surgeon: Nunzio Cobbs, MD;  Location: [facility];  Service: Gynecology;;    OB History    Gravida Para Term Preterm AB Living   0             SAB TAB Ectopic Multiple Live Births                   Home Medications    Prior to Admission medications   Medication Sig Start Date End Date Taking? Authorizing Provider  clobetasol (TEMOVATE) 0.05 % GEL Apply 1 application topically 2 (two) times daily as needed (for itching/irritation).     Historical Provider, MD  diphenhydrAMINE (BENADRYL) 25 mg capsule Take 1 capsule (25 mg total) by  mouth every 6 (six) hours as needed for itching. 04/10/16   Nunzio Cobbs, MD  ENBREL SURECLICK 50 MG/ML injection  02/20/16   Historical Provider, MD  esomeprazole (NEXIUM) 40 MG capsule Take 40 mg by mouth daily before breakfast.    Historical Provider, MD  hydrochlorothiazide (HYDRODIURIL) 25 MG tablet Take 25 mg by mouth daily.    Historical Provider, MD  ibuprofen (ADVIL,MOTRIN) 800 MG tablet Take 800 mg by mouth every 8 (eight) hours as needed for mild pain or moderate pain.    Historical Provider, MD  lactobacillus acidophilus (BACID) TABS tablet Take 1 tablet by mouth daily.    Historical Provider, MD  lisinopril (PRINIVIL,ZESTRIL) 10 MG tablet Take 1 tablet by mouth daily. 04/07/16   Historical Provider, MD  Multiple Vitamin (MULTIVITAMIN WITH MINERALS) TABS tablet Take 1 tablet by mouth daily.    Historical Provider, MD  nystatin (MYCOSTATIN) 100000 UNIT/ML suspension Take 5 mLs by mouth 4 (four) times daily as needed (for thrush). Pt is to swish and spit.    Historical Provider, MD  Pyridoxine HCl (VITAMIN B-6) 500 MG tablet Take 1,000 mg by mouth daily.    Historical Provider,  MD  traMADol (ULTRAM) 50 MG tablet Take 1 tablet (50 mg total) by mouth every 6 (six) hours as needed for moderate pain. Patient not taking: Reported on 04/16/2016 04/10/16   Nunzio Cobbs, MD    Family History Family History  Problem Relation Age of Onset  . Brain cancer Father   . Prostate cancer Father   . Hyperlipidemia Mother   . Colon cancer Maternal Grandmother 25  . Stroke Paternal Grandfather     COD  . Aneurysm Maternal Grandfather     Social History Social History  Substance Use Topics  . Smoking status: Current Every Day Smoker    Packs/day: 0.10    Years: 20.00    Types: Cigarettes  . Smokeless tobacco: Never Used     Comment: smoking 5 cigarettes a day, hasn't quit completely   . Alcohol use 9.6 oz/week    6 Glasses of wine, 10 Standard drinks or equivalent per  week     Comment: socially, weekends 4-5     Allergies   Aspirin; Toradol [ketorolac tromethamine]; and Adhesive [tape]   Review of Systems Review of Systems  Constitutional: Negative for activity change, chills, diaphoresis, fatigue, fever and malaise/fatigue.  HENT: Negative for congestion and rhinorrhea.   Eyes: Negative for visual disturbance.  Respiratory: Positive for chest tightness and shortness of breath. Negative for cough, sputum production, wheezing and stridor.   Cardiovascular: Positive for chest pain. Negative for palpitations, leg swelling, syncope and near-syncope.  Gastrointestinal: Negative for abdominal distention, abdominal pain, constipation, diarrhea, nausea and vomiting.  Genitourinary: Negative for difficulty urinating, dysuria, flank pain, frequency, hematuria, menstrual problem, pelvic pain, vaginal bleeding and vaginal discharge.  Musculoskeletal: Negative for back pain and neck pain.  Skin: Negative for rash and wound.  Neurological: Negative for dizziness, weakness, light-headedness, numbness and headaches.  Psychiatric/Behavioral: Negative for agitation and confusion.  All other systems reviewed and are negative.    Physical Exam Updated Vital Signs BP 120/66 (BP Location: Right Arm)   Pulse 93   Temp 98.4 F (36.9 C) (Oral)   Resp 18   LMP 02/28/2016   SpO2 100%   Physical Exam  Constitutional: She is oriented to person, place, and time. She appears well-developed and well-nourished. She appears distressed.  HENT:  Head: Normocephalic and atraumatic.  Eyes: Conjunctivae and EOM are normal. Pupils are equal, round, and reactive to light.  Neck: Normal range of motion. Neck supple.  Cardiovascular: Regular rhythm.  Tachycardia present.   No murmur heard. Pulmonary/Chest: Breath sounds normal. No stridor. She is in respiratory distress. She has no wheezes. She has no rales. She exhibits tenderness.  Abdominal: Soft. There is no tenderness.    Musculoskeletal: She exhibits no edema.  Neurological: She is alert and oriented to person, place, and time. She exhibits normal muscle tone.  Skin: Skin is warm and dry. Capillary refill takes less than 2 seconds. No rash noted. She is not diaphoretic. No erythema.  Psychiatric: She has a normal mood and affect.  Nursing note and vitals reviewed.    ED Treatments / Results  Labs (all labs ordered are listed, but only abnormal results are displayed) Labs Reviewed  BASIC METABOLIC PANEL - Abnormal; Notable for the following:       Result Value   Glucose, Bld 114 (*)    All other components within normal limits  CBC - Abnormal; Notable for the following:    Hemoglobin 11.4 (*)    HCT 35.5 (*)  RDW 23.9 (*)    All other components within normal limits  BRAIN NATRIURETIC PEPTIDE  I-STAT TROPOININ, ED  I-STAT TROPOININ, ED    EKG  EKG Interpretation  Date/Time:  Wednesday April 16 2016 14:57:58 EST Ventricular Rate:  90 PR Interval:    QRS Duration: 90 QT Interval:  357 QTC Calculation: 435 R Axis:   -2 Text Interpretation:  Sinus rhythm Low voltage, precordial leads Minimal ST elevation, inferior leads Baseline wander in lead(s) I II aVR When compared to prior, no significant changes were seen Abnormal ECG No STEMI Confirmed by Sherry Ruffing MD, [place] [phone]) on 04/16/2016 3:02:55 PM       Radiology Dg Chest 2 View  Result Date: 04/16/2016 CLINICAL DATA:  Mid chest pain onset today. History of costochondral hiatus. Shortness breath, hypertension EXAM: CHEST  2 VIEW COMPARISON:  09/18/2015 FINDINGS: Heart and mediastinal contours are within normal limits. No focal opacities or effusions. No acute bony abnormality. IMPRESSION: No active cardiopulmonary disease. Electronically Signed   By: Rolm Baptise M.D.   On: 04/16/2016 13:50   Ct Angio Chest Pe W And/or Wo Contrast  Result Date: 04/16/2016 CLINICAL DATA:  Shortness of breath and needed chest pain beginning last  night. Hysterectomy 1 week ago. EXAM: CT ANGIOGRAPHY CHEST WITH CONTRAST TECHNIQUE: Multidetector CT imaging of the chest was performed using the standard protocol during bolus administration of intravenous contrast. Multiplanar CT image reconstructions and MIPs were obtained to evaluate the vascular anatomy. CONTRAST:  100 mL Isovue 370 COMPARISON:  Two-view chest x-ray 04/16/2016 FINDINGS: Cardiovascular: Heart size is normal. A small pericardial effusion is present. Pulmonary arterial opacification is satisfactory. There are no focal filling defects to suggest pulmonary emboli. The aortic arch and great vessel origins are within normal limits. Mediastinum/Nodes: No significant mediastinal or axillary adenopathy is present. The thoracic inlet is within normal limits. Lungs/Pleura: Bone windows are somewhat degraded by patient motion. There is right basilar atelectasis. No other significant airspace disease is present. Upper Abdomen: Limited imaging of the upper abdomen is unremarkable. Musculoskeletal: Bone windows are unremarkable. No focal lytic or blastic lesions are present. Vertebral body heights are maintained. Review of the MIP images confirms the above findings. IMPRESSION: 1. No evidence for pulmonary embolus. 2. Minimal right basilar atelectasis. 3. No other acute or focal abnormality to explain the patient's chest pain or shortness of breath. Electronically Signed   By: San Morelle M.D.   On: 04/16/2016 17:17    Procedures Procedures (including critical care time)  Medications Ordered in ED Medications  HYDROmorphone (DILAUDID) injection 1 mg (1 mg Intravenous Given 04/16/16 1542)  sodium chloride 0.9 % bolus 1,000 mL (0 mLs Intravenous Stopped 04/16/16 1726)  iopamidol (ISOVUE-370) 76 % injection (100 mLs  Contrast Given 04/16/16 1655)  LORazepam (ATIVAN) injection 1 mg (1 mg Intravenous Given 04/16/16 1607)  gi cocktail (Maalox,Lidocaine,Donnatal) (30 mLs Oral Given 04/16/16 1633)    HYDROmorphone (DILAUDID) injection 1 mg (1 mg Intravenous Given 04/16/16 1633)  sodium chloride 0.9 % bolus 1,000 mL (0 mLs Intravenous Stopped 04/16/16 2029)  predniSONE (DELTASONE) tablet 30 mg (30 mg Oral Given 04/16/16 1951)  ondansetron (ZOFRAN-ODT) disintegrating tablet 4 mg (4 mg Oral Given 04/16/16 2045)     Initial Impression / Assessment and Plan / ED Course  I have reviewed the triage vital signs and the nursing notes.  Pertinent labs & imaging results that were available during my care of the patient were reviewed by me and considered in my medical decision making (  see chart for details).  Clinical Course     Dawn Hayes is a 45 y.o. female with a past medical history significant for recurrent costochondritis, psoriatic arthritis, hypertension, GERD, and recent hysterectomy surgery who presents with chest pain and shortness of breath.  History and exam are seen above. Patient has tenderness on her central chest. Patient's lungs have no wheezing or crackles. Patient taking very shallow breaths due to discomfort. Abdomen nontender. Symmetric pulses with no focal neurologic deficits. Next  Patient had EKG performed in triage which appeared similar to prior. No evidence of STEMI.   Patient was placed on 2 L nasal cannula oxygen after nursing saw she was satting in the low 90% range. Initial laboratory testing showed no leukocytosis, unremarkable BMP, and initial troponin negative. Chest x-ray showed no acute cardiopulmonary disease with no pneumothorax or pneumonia.   Further laboratory testing was added, pain medicine and fluids will be given, and CT PE study was ordered.  PE study negative for pulmonary Embolism. No apparent cause of pain visible. Patient discontinued from nasal cannula oxygen with no hypoxia. Laboratory testing grossly unremarkable.  Suspect patient's pain is due to recurrent costochondritis which the patient reports felt similar. Patient felt better after  pain medicine and nausea medicine. Given reassuring workup, feel patient is appropriate for discharge. Patient given dose of steroids due to previous prescription and not having home medicine available. Patient had some emesis after steroid administration.  Patient had resolution of pain, nausea, and vomiting. Patient discharged in good condition with plans to follow up with PCP and strict return precautions were observed.    Final Clinical Impressions(s) / ED Diagnoses   Final diagnoses:  Chest pain, unspecified type    New Prescriptions Discharge Medication List as of 04/16/2016  8:35 PM    START taking these medications   Details  cyclobenzaprine (FLEXERIL) 10 MG tablet Take 1 tablet (10 mg total) by mouth 2 (two) times daily as needed for muscle spasms., Starting Wed 04/16/2016, Print    ondansetron (ZOFRAN) 4 MG tablet Take 1 tablet (4 mg total) by mouth every 8 (eight) hours as needed for nausea or vomiting., Starting Wed 04/16/2016, Print    !! oxyCODONE-acetaminophen (PERCOCET/ROXICET) 5-325 MG tablet Take 1 tablet by mouth every 6 (six) hours as needed for severe pain., Starting Wed 04/16/2016, Print     !! - Potential duplicate medications found. Please discuss with provider.     Clinical Impression: 1. Chest pain, unspecified type     Disposition: Discharge  Condition: Good  I have discussed the results, Dx and Tx plan with the pt(& family if present). He/she/they expressed understanding and agree(s) with the plan. Discharge instructions discussed at great length. Strict return precautions discussed and pt &/or family have verbalized understanding of the instructions. No further questions at time of discharge.    Discharge Medication List as of 04/16/2016  8:35 PM    START taking these medications   Details  cyclobenzaprine (FLEXERIL) 10 MG tablet Take 1 tablet (10 mg total) by mouth 2 (two) times daily as needed for muscle spasms., Starting Wed 04/16/2016, Print      ondansetron (ZOFRAN) 4 MG tablet Take 1 tablet (4 mg total) by mouth every 8 (eight) hours as needed for nausea or vomiting., Starting Wed 04/16/2016, Print    !! oxyCODONE-acetaminophen (PERCOCET/ROXICET) 5-325 MG tablet Take 1 tablet by mouth every 6 (six) hours as needed for severe pain., Starting Wed 04/16/2016, Print     !! -  Potential duplicate medications found. Please discuss with provider.      Follow Up: Midge Minium, MD [address] [place] [address] [phone]  Schedule an appointment as soon as possible for a visit    [facility] [address] [id number] [place] [phone]  If symptoms worsen     Courtney Paris, MD 04/17/16 [phone]

## 2016-04-16 NOTE — Progress Notes (Signed)
Patient presents to clinic today c/o sternal chest pain starting around 8 PM last night and consistent since that time. Patient endorses pain with deep breathing. Denies SOB while resting. Denies racing heart, lightheadedness or dizziness. Denies radiation of pain. Pain is 10/10 at present. Has taken Oxycodone with no relief of symptoms. Patient had hysterectomy last week. Denies history of clot. Denies leg pain or swelling. Has history of costochondriitis and states this feels better. Pain is somewhat improved with sitting.   Past Medical History:  Diagnosis Date  . Anemia   . Arthritis   . Costochondritis   . Diverticulitis   . Dysmenorrhea   . GERD (gastroesophageal reflux disease)   . Headache    Migraines  . History of hiatal hernia   . Hypertension   . Menorrhagia   . Psoriatic arthritis ([place])   . Smoker     Current Outpatient Prescriptions on File Prior to Visit  Medication Sig Dispense Refill  . clobetasol (TEMOVATE) 0.05 % GEL Apply 1 application topically 2 (two) times daily as needed (for itching/irritation).     . diphenhydrAMINE (BENADRYL) 25 mg capsule Take 1 capsule (25 mg total) by mouth every 6 (six) hours as needed for itching. 30 capsule 0  . esomeprazole (NEXIUM) 40 MG capsule Take 40 mg by mouth daily before breakfast.    . hydrochlorothiazide (HYDRODIURIL) 25 MG tablet Take 25 mg by mouth daily.    Marland Kitchen ibuprofen (ADVIL,MOTRIN) 800 MG tablet Take 800 mg by mouth every 8 (eight) hours as needed for mild pain or moderate pain.    Marland Kitchen lactobacillus acidophilus (BACID) TABS tablet Take 1 tablet by mouth daily.    Marland Kitchen lisinopril (PRINIVIL,ZESTRIL) 10 MG tablet Take 1 tablet by mouth daily.    . Multiple Vitamin (MULTIVITAMIN WITH MINERALS) TABS tablet Take 1 tablet by mouth daily.    . Pyridoxine HCl (VITAMIN B-6) 500 MG tablet Take 1,000 mg by mouth daily.    Scarlette Shorts SURECLICK 50 MG/ML injection     . nystatin (MYCOSTATIN) 100000 UNIT/ML suspension Take 5 mLs by mouth 4  (four) times daily as needed (for thrush). Pt is to swish and spit.    Marland Kitchen traMADol (ULTRAM) 50 MG tablet Take 1 tablet (50 mg total) by mouth every 6 (six) hours as needed for moderate pain. (Patient not taking: Reported on 04/16/2016) 30 tablet 0   No current facility-administered medications on file prior to visit.     Allergies  Allergen Reactions  . Aspirin Other (See Comments)    Reaction:  Convulsions   . Toradol [Ketorolac Tromethamine] Nausea And Vomiting  . Adhesive [Tape] Itching and Rash    Family History  Problem Relation Age of Onset  . Brain cancer Father   . Prostate cancer Father   . Hyperlipidemia Mother   . Colon cancer Maternal Grandmother 88  . Stroke Paternal Grandfather     COD  . Aneurysm Maternal Grandfather     Social History   Social History  . Marital status: Married    Spouse name: N/A  . Number of children: 0  . Years of education: N/A   Occupational History  . [employer]    Social History Main Topics  . Smoking status: Current Every Day Smoker    Packs/day: 0.10    Years: 20.00    Types: Cigarettes  . Smokeless tobacco: Never Used     Comment: smoking 5 cigarettes a day, hasn't quit completely   .  Alcohol use 9.6 oz/week    6 Glasses of wine, 10 Standard drinks or equivalent per week     Comment: socially, weekends 4-5  . Drug use: No  . Sexual activity: Yes    Partners: Male    Birth control/ protection: None   Other Topics Concern  . None   Social History Narrative  . None    Review of Systems - See HPI.  All other ROS are negative.  BP 118/60   Pulse 95   Temp 98.2 F (36.8 C) (Oral)   Resp 16   Ht 5\' 2"  (1.575 m)   Wt 266 lb (120.7 kg)   LMP 02/28/2016   SpO2 97%   BMI 48.65 kg/m   Physical Exam  Constitutional: She is oriented to person, place, and time and well-developed, well-nourished, and in no distress.  HENT:  Head: Normocephalic and atraumatic.  Eyes: Conjunctivae are normal.  Neck:  Neck supple.  Cardiovascular: Normal rate, regular rhythm, normal heart sounds and intact distal pulses.   Pulmonary/Chest: No respiratory distress. She has no wheezes. She has no rales. She exhibits tenderness.  Neurological: She is alert and oriented to person, place, and time.  Skin: Skin is warm and dry. No rash noted.  Psychiatric: Affect normal.  Vitals reviewed.   Recent Results (from the past 2160 hour(s))  CBC     Status: Abnormal   Collection Time: 01/28/16  4:42 PM  Result Value Ref Range   WBC 6.9 3.8 - 10.8 K/uL   RBC 3.64 (L) 3.80 - 5.10 MIL/uL   Hemoglobin 10.0 (L) 11.7 - 15.5 g/dL   HCT 01/30/16 (L) 51.5 - 52.6 %   MCV 86.3 80.0 - 100.0 fL   MCH 27.5 27.0 - 33.0 pg   MCHC 31.8 (L) 32.0 - 36.0 g/dL   RDW 95.7 (H) 48.2 - 42.8 %   Platelets 333 140 - 400 K/uL   MPV 9.7 7.5 - 12.5 fL  Iron     Status: Abnormal   Collection Time: 01/28/16  4:42 PM  Result Value Ref Range   Iron 17 (L) 40 - 190 ug/dL  Ferritin     Status: Abnormal   Collection Time: 01/28/16  4:42 PM  Result Value Ref Range   Ferritin 7 (L) 10 - 232 ng/mL  CBC with Differential/Platelet     Status: Abnormal   Collection Time: 02/19/16  2:20 PM  Result Value Ref Range   WBC 8.7 3.9 - 10.3 10e3/uL   NEUT# 6.3 1.5 - 6.5 10e3/uL   HGB 10.2 (L) 11.6 - 15.9 g/dL   HCT 04/20/16 (L) 26.9 - 19.8 %   Platelets 335 145 - 400 10e3/uL   MCV 84.7 79.5 - 101.0 fL   MCH 26.8 25.1 - 34.0 pg   MCHC 31.7 31.5 - 36.0 g/dL   RBC 04.8 7.88 - 8.83 10e6/uL   RDW 15.1 (H) 11.2 - 14.5 %   lymph# 1.8 0.9 - 3.3 10e3/uL   MONO# 0.5 0.1 - 0.9 10e3/uL   Eosinophils Absolute 0.1 0.0 - 0.5 10e3/uL   Basophils Absolute 0.0 0.0 - 0.1 10e3/uL   NEUT% 72.1 38.4 - 76.8 %   LYMPH% 20.2 14.0 - 49.7 %   MONO% 6.0 0.0 - 14.0 %   EOS% 1.5 0.0 - 7.0 %   BASO% 0.2 0.0 - 2.0 %  Iron and TIBC     Status: Abnormal   Collection Time: 02/19/16  2:20 PM  Result Value Ref Range  Iron 25 (L) 41 - 142 ug/dL   TIBC 443 236 - 444 ug/dL   UIBC  418 (H) 120 - 384 ug/dL   %SAT 6 (L) 21 - 57 %  Ferritin     Status: Abnormal   Collection Time: 02/19/16  2:20 PM  Result Value Ref Range   Ferritin 6 (L) 9 - 269 ng/ml  Pap Test with HP (IPS)     Status: None   Collection Time: 03/17/16  3:00 PM  Result Value Ref Range   COMMENTS: [facility]     Comment: [place], [place] [address] [place] [address] GYN CYTOLOGY REPORT  PATIENT NAME:Hayes, Dawn E PATHOLOGY#:[id number]: F DOB: 1970/06/06 (Age: 45) MEDICAL RECORD IDPOEU:[id number] DOCTOR:Brook Quincy Simmonds, MD DATE OBTAINED:11/6/2017CLIENT:[facility] DATE RECEIVED:11/10/2017OTHER PHYS: DATE SIGNED:03/25/2016 PAP Thinlayer with HPV Final Cytologic Interpretation:       Cervical, ThinLayer with Automated Imaging and Dual Review, CPT 88175      Negative for Intraepithelial Lesions or Malignancy.       ADEQUACY OF SPECIMEN:           Satisfactory for evaluation. Endocervical cells/transformation zone component identified.             NOTE: This Pap test has been evaluated with computer assisted technology.       Electronically signed by: LDT, CT(ASCP), [address], [place], [place], (Med. Dir.: Sandrea Hughs, MD) ldt/11/14/2017The Pap test is a screening mechanism with excellent but not perfect ability to prevent cervica l carcinoma.  It has a low,  but significant, diagnostic error rate. The pap test is suboptimal  for detection of glandular lesions.  It should be noted that a negative result does not definitively rule out the presence of disease.Ref: DeMay, RM, The Art and Science of  Cytopathology, [employer], [phone]. HPV Results   High Risk HPV -    Not Detected  Reference Range = Not Detected A result of "Detected" signifies the presence of one or more high risk types of HPV.  The APTIMA HPV Assay is an in vitro nucleic acid amplification test for the qualitative detection of E6/E7 viral messenger RNA (mRNA)  from 14 high-risk types of  human papillomavirus (HPV) in cervical specimens. The high-risk HPV types detected by the assay include: 16, 18, 31, 33, 35, 39, 45, 51, 52, 56, 58, 59, 66, and 68. APTIMA HPV method will be performed on the [employer].  The APTIMA HPV Assay is designed to enhance existing methods for the detection of cervical disease and should be use d in conjunction with clinical information derived from other diagnostic and screening tests, physical examinations, and full medical  history in accordance with appropriate patient management procedures. The APTIMA HPV Assay on cervical ThinPrep(tm) PreservCyt(tm) specimens is FDA approved on the [employer].  The vaginal ThinPrep(tm) PreservCyt(tm) specimen source has been validated as a minor modification.  The APTIMA HPV Assay on  SurePath(tm) specimens was developed and its performance characteristics determined by [facility].  It has not been cleared or approved by the U.S. Food and Drug Administration.  The FDA has determined that such clearance or approval is not  necessary.  This test is used for clinical purposes.  This laboratory is certified under the [place] (CLIA) as qualified to perform high complexity clinical laboratory testing. Electronically signed by: Bethann Goo, [place](ASCP) [address],  [place], [place] (Med. Dir.: Rathdrum  Lewis) Last Menstrual Period: 03/12/2016  Technical processing performed at [facility], [address] [place], [place] [address], CLIA# [id number], unless otherwise indicated.   POCT urine pregnancy     Status: Normal   Collection Time: 03/17/16  3:49 PM  Result Value Ref Range   Preg Test, Ur Negative Negative  CBC     Status: Abnormal   Collection Time: 03/27/16 12:12 PM  Result Value Ref Range   WBC 12.5 (H) 4.0 - 10.5 K/uL   RBC 4.34 3.87 - 5.11 MIL/uL   Hemoglobin 12.0 12.0 - 15.0  g/dL   HCT 37.7 36.0 - 46.0 %   MCV 86.9 78.0 - 100.0 fL   MCH 27.6 26.0 - 34.0 pg   MCHC 31.8 30.0 - 36.0 g/dL   RDW 21.7 (H) 11.5 - 15.5 %   Platelets 248 150 - 400 K/uL  Comprehensive metabolic panel     Status: Abnormal   Collection Time: 03/27/16 12:12 PM  Result Value Ref Range   Sodium 135 135 - 145 mmol/L   Potassium 3.7 3.5 - 5.1 mmol/L   Chloride 100 (L) 101 - 111 mmol/L   CO2 26 22 - 32 mmol/L   Glucose, Bld 90 65 - 99 mg/dL   BUN 21 (H) 6 - 20 mg/dL   Creatinine, Ser 0.71 0.44 - 1.00 mg/dL   Calcium 9.8 8.9 - 10.3 mg/dL   Total Protein 7.6 6.5 - 8.1 g/dL   Albumin 4.0 3.5 - 5.0 g/dL   AST 26 15 - 41 U/L   ALT 36 14 - 54 U/L   Alkaline Phosphatase 59 38 - 126 U/L   Total Bilirubin 0.7 0.3 - 1.2 mg/dL   GFR calc non Af Amer >60 >60 mL/min   GFR calc Af Amer >60 >60 mL/min    Comment: (NOTE) The eGFR has been calculated using the CKD EPI equation. This calculation has not been validated in all clinical situations. eGFR's persistently <60 mL/min signify possible Chronic Kidney Disease.    Anion gap 9 5 - 15  Type and screen     Status: None   Collection Time: 03/27/16 12:12 PM  Result Value Ref Range   ABO/RH(D) A POS    Antibody Screen NEG    Sample Expiration 04/10/2016    Extend sample reason NO TRANSFUSIONS OR PREGNANCY IN THE PAST 3 MONTHS   Pregnancy, urine     Status: None   Collection Time: 04/08/16  6:10 AM  Result Value Ref Range   Preg Test, Ur NEGATIVE NEGATIVE    Comment:        THE SENSITIVITY OF THIS METHODOLOGY IS >20 mIU/mL.   CBC     Status: Abnormal   Collection Time: 04/09/16  6:05 AM  Result Value Ref Range   WBC 9.4 4.0 - 10.5 K/uL   RBC 3.62 (L) 3.87 - 5.11 MIL/uL   Hemoglobin 10.4 (L) 12.0 - 15.0 g/dL   HCT 32.6 (L) 36.0 - 46.0 %   MCV 90.1 78.0 - 100.0 fL   MCH 28.7 26.0 - 34.0 pg   MCHC 31.9 30.0 - 36.0 g/dL   RDW 24.2 (H) 11.5 - 15.5 %   Platelets 229 150 - 400 K/uL  Basic metabolic panel     Status: Abnormal   Collection  Time: 04/09/16  6:05 AM  Result Value Ref Range   Sodium 134 (L) 135 - 145 mmol/L   Potassium 3.7 3.5 - 5.1 mmol/L   Chloride  101 101 - 111 mmol/L   CO2 24 22 - 32 mmol/L   Glucose, Bld 103 (H) 65 - 99 mg/dL   BUN 8 6 - 20 mg/dL   Creatinine, Ser 0.64 0.44 - 1.00 mg/dL   Calcium 8.9 8.9 - 10.3 mg/dL   GFR calc non Af Amer >60 >60 mL/min   GFR calc Af Amer >60 >60 mL/min    Comment: (NOTE) The eGFR has been calculated using the CKD EPI equation. This calculation has not been validated in all clinical situations. eGFR's persistently <60 mL/min signify possible Chronic Kidney Disease.    Anion gap 9 5 - 15  CBC     Status: Abnormal   Collection Time: 04/10/16  6:02 AM  Result Value Ref Range   WBC 7.5 4.0 - 10.5 K/uL   RBC 3.33 (L) 3.87 - 5.11 MIL/uL   Hemoglobin 9.7 (L) 12.0 - 15.0 g/dL   HCT 30.4 (L) 36.0 - 46.0 %   MCV 91.3 78.0 - 100.0 fL   MCH 29.1 26.0 - 34.0 pg   MCHC 31.9 30.0 - 36.0 g/dL   RDW 24.7 (H) 11.5 - 15.5 %   Platelets 208 150 - 400 K/uL  CBC     Status: Abnormal   Collection Time: 04/10/16 11:48 AM  Result Value Ref Range   WBC 10.0 4.0 - 10.5 K/uL   RBC 3.36 (L) 3.87 - 5.11 MIL/uL   Hemoglobin 10.0 (L) 12.0 - 15.0 g/dL   HCT 30.1 (L) 36.0 - 46.0 %   MCV 89.6 78.0 - 100.0 fL   MCH 29.8 26.0 - 34.0 pg   MCHC 33.2 30.0 - 36.0 g/dL   RDW 24.5 (H) 11.5 - 15.5 %   Platelets 236 150 - 400 K/uL    Assessment/Plan: 1. Mid sternal chest pain Pain 10/10. Vitals stable. EKG with NSR and poor R wave progression. No sign of STEMI or diffuse ST elevation of pericarditis. Pain only somewhat reproduced with palpation over sternum. Patient noting significant pleuritic pain. Discussed need for ER assessment immediately. Discussed case and findings with supervising MD, Tabori who agrees patient stable to go to ER in car with husband. They are going straight to [facility]. Very well could be a severe costochondritis but needs CTA to r/o clot. IM/IV pain medication needed as well.    - EKG 12-Lead   Leeanne Rio, PA-C

## 2016-04-16 NOTE — ED Notes (Signed)
MD at bedside. 

## 2016-04-16 NOTE — ED Notes (Signed)
Patient transported to CT 

## 2016-04-16 NOTE — Discharge Instructions (Signed)
Please follow-up with your primary doctor for further management of her chest pain. If symptoms return or worsen, please return to the nearest emergency department.

## 2016-04-17 NOTE — Discharge Summary (Signed)
Physician Discharge Summary  Patient ID: Dawn Hayes MRN: [id number] DOB/AGE: [id number] y.o.  Admit date: 04/08/2016 Discharge date:  04/10/16 Admission Diagnoses: 1.  Menorrhagia with irregular menses. 2.  Anemia.  Discharge Diagnoses:  1.  Menorrhagia with irregular menses. 2.  Anemia. 3.  Status post attempted total laparoscopic hysterectomy with conversion to total abdominal hysterectomy with bilateral salpingectomy, lysis of adhesions, and cystoscopy on 04/08/16.  Active Problems:   Status post total abdominal hysterectomy   Discharged Condition: good  Hospital Course:  The patient was admitted on 04/08/16 for an attempted total laparoscopic hysterectomy with conversion to total abdominal hysterectomy with bilateral salpingectomy, lysis of adhesions, and cystoscopy. which were performed without complication while under general anesthesia.  The patient's post op course was uneventful.  She had a Dilaudid PCA for pain control initially, and this was converted over to Percocet and Motrin on post op day one when the patient began taking po well.  She had significant pruritis, and her pain medication was therefore switched to Tramadol.  She ambulated independently and wore PAS and received preop Lovenox for DVT prophylaxis.  Her foley catheter were removed on post op day one, and she voided good volumes. The patient's vital signs remained stable and she demonstrated no signs of infection during her hospitalization.  Her blood pressure remained low, and her heart rate was stable.  She was treated with her usual diuretic and her Lisinopril was held during her hospitalization. The patient's post op Hgb was followed serially.  On post op day one, the Hgb was 10.4.  On post op day two, the hemoglobin was 9.7 in the morning and then 10.0 in the afternoon.  She was tolerating the this well.  She had very minimal vaginal bleeding, and her incision(s) demonstrated no signs of erythema or  significant drainage.  She was found to be in good condition and ready for discharge on post op day two.  Consults: None  Significant Diagnostic Studies: labs: See hospital course.  Treatments: surgery:  Attempted total laparoscopic hysterectomy with conversion to total abdominal hysterectomy with bilateral salpingectomy, lysis of adhesions, and cystoscopy.  Discharge Exam: Blood pressure 113/65, pulse 77, temperature 98 F (36.7 C), temperature source Oral, resp. rate 18, height 5\' 2"  (1.575 m), weight 272 lb (123.4 kg), last menstrual period 02/28/2016, SpO2 97 %. General: alert and cooperative Resp: clear to auscultation bilaterally Cardio: regular rate and rhythm, S1, S2 normal, no murmur, click, rub or gallop GI: soft, non-tender; bowel sounds normal; no masses,  no organomegaly and incision: clean, dry and intact Vaginal Bleeding: none  Disposition: 01-Home or Self Care     Medication List    STOP taking these medications   ENBREL SURECLICK 50 MG/ML injection Generic drug:  etanercept   lisinopril 10 MG tablet Commonly known as:  PRINIVIL,ZESTRIL   medroxyPROGESTERone 10 MG tablet Commonly known as:  PROVERA   predniSONE 10 MG tablet Commonly known as:  DELTASONE     TAKE these medications   clobetasol 0.05 % Gel Commonly known as:  TEMOVATE Apply 1 application topically 2 (two) times daily as needed (for itching/irritation).   diphenhydrAMINE 25 mg capsule Commonly known as:  BENADRYL Take 1 capsule (25 mg total) by mouth every 6 (six) hours as needed for itching.   esomeprazole 40 MG capsule Commonly known as:  NEXIUM Take 40 mg by mouth daily before breakfast.   hydrochlorothiazide 25 MG tablet Commonly known as:  HYDRODIURIL Take 25 mg by mouth  daily.   ibuprofen 800 MG tablet Commonly known as:  ADVIL,MOTRIN Take 800 mg by mouth every 8 (eight) hours as needed for mild pain or moderate pain.   lactobacillus acidophilus Tabs tablet Take 1 tablet  by mouth daily.   multivitamin with minerals Tabs tablet Take 1 tablet by mouth daily.   nystatin 100000 UNIT/ML suspension Commonly known as:  MYCOSTATIN Take 5 mLs by mouth 4 (four) times daily as needed (for thrush). Pt is to swish and spit.   traMADol 50 MG tablet Commonly known as:  ULTRAM Take 1 tablet (50 mg total) by mouth every 6 (six) hours as needed for moderate pain.   vitamin B-6 500 MG tablet Take 1,000 mg by mouth daily.      Follow-up Information    [name], MD In 4 days.   Specialty:  Obstetrics and Gynecology Contact information: [address] [place] [address] [phone]           Signed: [name] 04/17/2016, 6:34 AM

## 2016-04-18 LAB — HEMOGLOBIN, FINGERSTICK: Hemoglobin, fingerstick: 10.2 g/dL — ABNORMAL LOW (ref 12.0–16.0)

## 2016-04-22 ENCOUNTER — Ambulatory Visit
Admission: RE | Admit: 2016-04-22 | Discharge: 2016-04-22 | Disposition: A | Discharge status: Home / Self Care | Payer: Commercial Managed Care - HMO | Source: Ambulatory Visit | Attending: Obstetrics and Gynecology | Admitting: Obstetrics and Gynecology

## 2016-04-22 DIAGNOSIS — Z1231 Encounter for screening mammogram for malignant neoplasm of breast: Secondary | ICD-10-CM

## 2016-04-23 ENCOUNTER — Encounter: Payer: Self-pay | Admitting: Obstetrics and Gynecology

## 2016-04-24 ENCOUNTER — Other Ambulatory Visit: Payer: Self-pay | Admitting: Obstetrics and Gynecology

## 2016-04-24 ENCOUNTER — Encounter: Payer: Self-pay | Admitting: Obstetrics and Gynecology

## 2016-04-24 DIAGNOSIS — R928 Other abnormal and inconclusive findings on diagnostic imaging of breast: Secondary | ICD-10-CM

## 2016-04-25 ENCOUNTER — Telehealth: Payer: Self-pay | Admitting: Obstetrics and Gynecology

## 2016-04-25 NOTE — Telephone Encounter (Signed)
I have signed the letter.

## 2016-04-25 NOTE — Telephone Encounter (Signed)
Patient had surgery on 03/13/16. Patient wants to speak with Dr. Quincy Simmonds about possibly going back to work. She would like to work from home.

## 2016-04-25 NOTE — Telephone Encounter (Signed)
Spoke with patient. Patient had [place] on 04/08/2016. Patient has the capability to work from home. Feel she was able to get good rest this week and would like to be able to work from home for a couple of hours per day next week. Denies any concerns. Work is Conservation officer, historic buildings based. States her employer will need a letter stating it is okay for her to work from home if Dr.Silva is agreeable. Advised I will review with Dr.Silva and return call. Patient is agreeable.

## 2016-04-25 NOTE — Telephone Encounter (Signed)
Spoke with patient. Patient states that she is no longer having to take Percocet. Is taking Ibuprofen occasionally. Advised Dr.Silva is okay with her returning to work from home for a couple of hours per day starting 04/28/2016 as long as she is not having to take Percocet. Patient is agreeable. Advised letter will be written and faxed to Terri Dinallo in HR to [phone] per request. Patient is agreeable.  Letter to Dr.Silva for review and signature before faxing.

## 2016-04-25 NOTE — Telephone Encounter (Signed)
[place] for letter that it is ok to work a couple hours per day from home starting next week.  However, I do not recommend working if patient is still needing any narcotic medication.

## 2016-04-28 NOTE — Telephone Encounter (Signed)
Signed letter faxed to [phone] with cover sheet and confirmation to attention Terri Dinallo in HR.  Routing to provider for final review. Patient agreeable to disposition. Will close encounter.

## 2016-04-30 ENCOUNTER — Ambulatory Visit
Admission: RE | Admit: 2016-04-30 | Discharge: 2016-04-30 | Disposition: A | Discharge status: Home / Self Care | Payer: Commercial Managed Care - HMO | Source: Ambulatory Visit | Attending: Obstetrics and Gynecology | Admitting: Obstetrics and Gynecology

## 2016-04-30 DIAGNOSIS — R928 Other abnormal and inconclusive findings on diagnostic imaging of breast: Secondary | ICD-10-CM

## 2016-05-02 ENCOUNTER — Other Ambulatory Visit: Payer: Commercial Managed Care - HMO

## 2016-05-06 ENCOUNTER — Encounter: Payer: Self-pay | Admitting: Obstetrics and Gynecology

## 2016-05-07 ENCOUNTER — Telehealth: Payer: Self-pay

## 2016-05-07 NOTE — Telephone Encounter (Signed)
Routing to Dr.Silva for review and advise. 

## 2016-05-07 NOTE — Telephone Encounter (Signed)
Visit Follow-Up Question  Message [id number]  From [place], MD Sent 05/06/2016 11:08 AM  Good Morning! I am wondering what my return to work date is. If it is not next week, can we increase my work from home hours to 4? Thank you!  Roselin   Responsible Party   Pool - [facility] Clinical Pool No one has taken responsibility for this message.  No actions have been taken on this message.   Patient had ATTEMPTED HYSTERECTOMY TOTAL LAPAROSCOPIC (N/A Abdomen) [id number] CPT (R)] LAPAROSCOPIC BILATERAL SALPINGECTOMY possible BSO (N/A Abdomen) CYSTOSCOPY (N/A Urethra) HYSTERECTOMY ABDOMINAL WITH SALPINGECTOMY (Bilateral Abdomen) LAPAROSCOPIC LYSIS OF ADHESIONS on 04/08/2016. Patient has been approved to work from home 2 hours per day by Dr.Silva on 04/25/2016. Patient's 6 week post op is scheduled for 05/19/2016. Asking if she may start working 4 hours per day at this time.

## 2016-05-07 NOTE — Telephone Encounter (Signed)
Patient had surgery and wants to know when she can return to work.

## 2016-05-07 NOTE — Telephone Encounter (Signed)
Spoke with patient. Advised signed letter has been faxed with cover sheet and confirmation to [phone] attention Terri Dinallo in HR. Patient is agreeable.  Routing to provider for final review. Patient agreeable to disposition. Will close encounter.

## 2016-05-07 NOTE — Telephone Encounter (Signed)
Spoke with patient. Patient states that she feels she will be able to return to work for four hours from home starting 05/13/2016. Feels she will be able to complete all work responsibilities at that time. Letter written and to Dr.Silva for review and signature before faxing to [phone] attention Terri Dinallo in HR.

## 2016-05-07 NOTE — Telephone Encounter (Signed)
Telephone encounter created to review with Dr.Silva. 

## 2016-05-07 NOTE — Telephone Encounter (Signed)
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 I expect she needs a letter written.  Please assist with this.  Keep appointment for 6 week post op visit.

## 2016-05-19 ENCOUNTER — Encounter: Payer: Self-pay | Admitting: Obstetrics and Gynecology

## 2016-05-19 ENCOUNTER — Ambulatory Visit: Payer: Commercial Managed Care - HMO | Admitting: Obstetrics and Gynecology

## 2016-05-19 ENCOUNTER — Ambulatory Visit (INDEPENDENT_AMBULATORY_CARE_PROVIDER_SITE_OTHER): Payer: Commercial Managed Care - HMO | Admitting: Obstetrics and Gynecology

## 2016-05-19 VITALS — BP 124/80 | HR 80 | Resp 20 | Ht 62.0 in | Wt 269.8 lb

## 2016-05-19 DIAGNOSIS — Z9889 Other specified postprocedural states: Secondary | ICD-10-CM

## 2016-05-19 NOTE — Progress Notes (Signed)
GYNECOLOGY  VISIT   HPI: 46 y.o.   Married  Caucasian  female   G0P0 with Patient's last menstrual period was 02/28/2016.   here for 6 week follow up [place] (N/A Abdomen) [id number] CPT (R)] LAPAROSCOPIC BILATERAL SALPINGECTOMY possible BSO (N/A Abdomen) CYSTOSCOPY (N/A Urethra) HYSTERECTOMY ABDOMINAL WITH SALPINGECTOMY (Bilateral Abdomen) LAPAROSCOPIC LYSIS OF ADHESIONS  Some left sided soreness along the incision.  Working half days from home.   No spotting.  Voiding well.  Normal bowel movements.   Having costochondroitis.  Went to the ER to rule out for a PE.  Having steroid tx through rheumatology, Dr. Dillard Essex.  Has an appt with him on 05/21/16.  Ok to return to work this week.   GYNECOLOGIC HISTORY: Patient's last menstrual period was 02/28/2016. Contraception:  Hysterectomy Menopausal hormone therapy:  none Last mammogram: 04/23/16 [facility] D[facility]; 04/2016-Dx & U/S L Breast, [facility] Last pap smear: 03/17/16 WNL neg HR HPV        OB History    Gravida Para Term Preterm AB Living   0             SAB TAB Ectopic Multiple Live Births                     Patient Active Problem List   Diagnosis Date Noted  . Status post total abdominal hysterectomy 04/08/2016  . Costochondritis 12/04/2015  . Maxillary sinusitis, acute 12/04/2015  . Anemia, iron deficiency 10/10/2015  . Tick bites 10/10/2015  . Fatigue 10/10/2015  . Allergic rhinitis 09/07/2015  . Hemorrhoid   . Diverticulosis of colon without hemorrhage   . Hiatal hernia   . Anemia 05/08/2015  . Rectal bleeding 05/08/2015  . Chest pain, rule out acute myocardial infarction 09/27/2013  . Abnormal finding on EKG 09/16/2013  . Psoriasis 09/16/2013  . Psoriatic arthritis ([place]) 09/16/2013  . HTN (hypertension) 09/16/2013  . Morbid obesity- BMI 47 09/16/2013  . GERD 12/11/2009  . DIARRHEA, CHRONIC 12/11/2009    Past Medical History:  Diagnosis  Date  . Anemia   . Arthritis   . Costochondritis   . Diverticulitis   . Dysmenorrhea   . GERD (gastroesophageal reflux disease)   . Headache    Migraines  . History of hiatal hernia   . Hypertension   . Menorrhagia   . Psoriatic arthritis ([place])   . Smoker     Past Surgical History:  Procedure Laterality Date  . CARPAL TUNNEL RELEASE Left 01/19/2015   Procedure: LEFT CARPAL TUNNEL RELEASE;  Surgeon: Roseanne Kaufman, MD;  Location: [facility];  Service: Orthopedics;  Laterality: Left;  . CHOLECYSTECTOMY    . CHOLECYSTECTOMY, LAPAROSCOPIC  2006  . COLONOSCOPY  1998   normal, [place]  . COLONOSCOPY N/A 05/30/2015   RMR: Minimal anal canal hemorrhoids. colonic diverticulosis. I suspect relatively trivial recent gI Bleed likely related to hemorrhoids. This finding alone would not likely adequatly explain her degree of anemia. she likely has a significent component from menstrual losses. , etc.   . CYSTOSCOPY N/A 04/08/2016   Procedure: Consuela Mimes;  Surgeon: Nunzio Cobbs, MD;  Location: [facility];  Service: Gynecology;  Laterality: N/A;  . DILATATION & CURETTAGE/HYSTEROSCOPY WITH MYOSURE N/A 01/04/2016   Procedure: DILATATION & CURETTAGE/HYSTEROSCOPY;  Surgeon: Nunzio Cobbs, MD;  Location: [facility];  Service: Gynecology;  Laterality: N/A;  . ESOPHAGOGASTRODUODENOSCOPY  09/11/99   tiny esophageal erosions with mild erosive  reflux/no barrett's/normal stomach  . ESOPHAGOGASTRODUODENOSCOPY N/A 05/30/2015   RMR: Hiatal hernia otherwise negative EGD  . HYSTERECTOMY ABDOMINAL WITH SALPINGECTOMY Bilateral 04/08/2016   Procedure: HYSTERECTOMY ABDOMINAL WITH SALPINGECTOMY;  Surgeon: Nunzio Cobbs, MD;  Location: [facility];  Service: Gynecology;  Laterality: Bilateral;  . LAPAROSCOPIC BILATERAL SALPINGECTOMY N/A 04/08/2016   Procedure: LAPAROSCOPIC BILATERAL SALPINGECTOMY possible BSO;  Surgeon: Nunzio Cobbs, MD;  Location: [facility];  Service:  Gynecology;  Laterality: N/A;  . LAPAROSCOPIC HYSTERECTOMY N/A 04/08/2016   Procedure: ATTEMPTED HYSTERECTOMY TOTAL LAPAROSCOPIC;  Surgeon: Nunzio Cobbs, MD;  Location: [facility];  Service: Gynecology;  Laterality: N/A;  . LAPAROSCOPIC LYSIS OF ADHESIONS  04/08/2016   Procedure: LAPAROSCOPIC LYSIS OF ADHESIONS;  Surgeon: Nunzio Cobbs, MD;  Location: [facility];  Service: Gynecology;;    Current Outpatient Prescriptions  Medication Sig Dispense Refill  . clobetasol (TEMOVATE) 0.05 % GEL Apply 1 application topically 2 (two) times daily as needed (for itching/irritation).     Scarlette Shorts SURECLICK 50 MG/ML injection Inject 50 mg into the skin once a week. Tuesday    . esomeprazole (NEXIUM) 40 MG capsule Take 40 mg by mouth daily before breakfast.    . hydrochlorothiazide (HYDRODIURIL) 25 MG tablet Take 25 mg by mouth daily.    Marland Kitchen ibuprofen (ADVIL,MOTRIN) 800 MG tablet Take 800 mg by mouth every 8 (eight) hours as needed for mild pain or moderate pain.    Marland Kitchen lactobacillus acidophilus (BACID) TABS tablet Take 1 tablet by mouth daily.    Marland Kitchen lisinopril (PRINIVIL,ZESTRIL) 10 MG tablet Take 1 tablet by mouth daily.    . Multiple Vitamin (MULTIVITAMIN WITH MINERALS) TABS tablet Take 1 tablet by mouth daily.    Marland Kitchen nystatin (MYCOSTATIN) 100000 UNIT/ML suspension Take 5 mLs by mouth 4 (four) times daily as needed (for thrush). Pt is to swish and spit.    . predniSONE (DELTASONE) 5 MG tablet Take by mouth.    . Pyridoxine HCl (VITAMIN B-6) 500 MG tablet Take 1,000 mg by mouth daily.    . RESTASIS 0.05 % ophthalmic emulsion   4   No current facility-administered medications for this visit.      ALLERGIES: Aspirin; Toradol [ketorolac tromethamine]; and Adhesive [tape]  Family History  Problem Relation Age of Onset  . Brain cancer Father   . Prostate cancer Father   . Hyperlipidemia Mother   . Colon cancer Maternal Grandmother 12  . Stroke Paternal Grandfather     COD  . Aneurysm  Maternal Grandfather     Social History   Social History  . Marital status: Married    Spouse name: N/A  . Number of children: 0  . Years of education: N/A   Occupational History  . [employer]    Social History Main Topics  . Smoking status: Current Every Day Smoker    Packs/day: 0.10    Years: 20.00    Types: Cigarettes  . Smokeless tobacco: Never Used     Comment: smoking 5 cigarettes a day, hasn't quit completely   . Alcohol use 9.6 oz/week    6 Glasses of wine, 10 Standard drinks or equivalent per week     Comment: socially, weekends 4-5  . Drug use: No  . Sexual activity: Yes    Partners: Male    Birth control/ protection: None   Other Topics Concern  . Not on file   Social History Narrative  . No  narrative on file    ROS:  Pertinent items are noted in HPI.  PHYSICAL EXAMINATION:    BP 124/80 (BP Location: Right Arm, Patient Position: Sitting, Cuff Size: Large)   Pulse 80   Resp 20   Ht 5\' 2"  (1.575 m)   Wt 269 lb 12.8 oz (122.4 kg)   LMP 02/28/2016   BMI 49.35 kg/m     General appearance: alert, cooperative and appears stated age   Abdomen: laparoscopic and Pfannenstiel incisions intact, soft, non-tender, no masses,  no organomegaly Skin:  Multiple lesions of psoriasis.   Pelvic: External genitalia:  no lesions              Urethra:  normal appearing urethra with no masses, tenderness or lesions              Bartholins and Skenes: normal                 Vagina: normal appearing vagina with normal color and discharge, no lesions.  Cuff intact.              Cervix:  Absent.                Bimanual Exam:  Uterus:   Absent.               Adnexa: no mass, fullness, tenderness              Chaperone was present for exam.  ASSESSMENT  Status post attempted laparoscopic hysterectomy with conversion to TAH/bilateral salpingectomy/lysis of adhesions. Psoriasis.  Having a flare.  PLAN  Ok to return to work on 05/22/16.  No restrictions.   Letter written. No resumuption of interocourse for one week.  Return for annual exam every year around the date of hysterectomy surgery.    An After Visit Summary was printed and given to the patient.

## 2016-05-21 DIAGNOSIS — L4050 Arthropathic psoriasis, unspecified: Secondary | ICD-10-CM | POA: Diagnosis not present

## 2016-05-21 DIAGNOSIS — M940 Chondrocostal junction syndrome [Tietze]: Secondary | ICD-10-CM | POA: Diagnosis not present

## 2016-05-27 ENCOUNTER — Encounter: Payer: Self-pay | Admitting: Family Medicine

## 2016-05-27 ENCOUNTER — Ambulatory Visit (INDEPENDENT_AMBULATORY_CARE_PROVIDER_SITE_OTHER): Payer: Commercial Managed Care - HMO | Admitting: Family Medicine

## 2016-05-27 VITALS — BP 131/89 | HR 91 | Temp 98.1°F | Resp 17 | Ht 62.0 in | Wt 276.0 lb

## 2016-05-27 DIAGNOSIS — F418 Other specified anxiety disorders: Secondary | ICD-10-CM

## 2016-05-27 DIAGNOSIS — F419 Anxiety disorder, unspecified: Secondary | ICD-10-CM

## 2016-05-27 DIAGNOSIS — Z111 Encounter for screening for respiratory tuberculosis: Secondary | ICD-10-CM

## 2016-05-27 DIAGNOSIS — F329 Major depressive disorder, single episode, unspecified: Secondary | ICD-10-CM

## 2016-05-27 MED ORDER — NYSTATIN 100000 UNIT/ML MT SUSP: 5.0000 mL | Freq: Four times a day (QID) | OROMUCOSAL | 2 refills | Status: DC | PRN

## 2016-05-27 MED ORDER — BUPROPION HCL ER (XL) 150 MG PO TB24: 150.0000 mg | ORAL_TABLET | Freq: Every day | ORAL | 3 refills | Status: DC

## 2016-05-27 NOTE — Progress Notes (Signed)
Pre visit review using our clinic review tool, if applicable. No additional management support is needed unless otherwise documented below in the visit note. 

## 2016-05-27 NOTE — Patient Instructions (Addendum)
Follow up in 3-4 weeks to recheck anxiety/mood We'll complete the TB form once we have the lab results Start the Wellbutrin once daily for smoking and mood Use the Nystatin as needed for thrush Call with any questions or concerns Hang in there!!!

## 2016-05-27 NOTE — Progress Notes (Signed)
   Subjective:    Patient ID: NELVIA MEINEKE, female    DOB: 1970/07/30, 46 y.o.   MRN: [id number]  HPI Mood- pt reports she has been very emotional since her hysterectomy.  Pt reports she was starting to feel this way prior to surgery but sxs have worsened.  Ovaries remain.  Increased anger and irritability.  More tearful.  Pt has also been on prednisone due to costochondritis.  Pt is also interested in quitting smoking.   Review of Systems For ROS see HPI     Objective:   Physical Exam  Constitutional: She is oriented to person, place, and time. She appears well-developed and well-nourished. No distress.  HENT:  Head: Normocephalic and atraumatic.  Neurological: She is alert and oriented to person, place, and time.  Skin: Skin is warm and dry.  Psychiatric: She has a normal mood and affect. Her behavior is normal. Thought content normal.  Vitals reviewed.         Assessment & Plan:

## 2016-05-27 NOTE — Assessment & Plan Note (Signed)
New.  sxs were starting prior to surgery but have acutely worsened.  Since she is interested in smoking cessation, will start Wellbutrin and monitor closely for improvement.  Pt expressed understanding and is in agreement w/ plan.

## 2016-05-30 LAB — QUANTIFERON TB GOLD ASSAY (BLOOD)
INTERFERON GAMMA RELEASE ASSAY: NEGATIVE
Mitogen-Nil: 5.23 IU/mL
QUANTIFERON TB AG MINUS NIL: 0.02 [IU]/mL
Quantiferon Nil Value: 0.04 IU/mL

## 2016-06-06 ENCOUNTER — Other Ambulatory Visit: Payer: Self-pay | Admitting: Family Medicine

## 2016-06-09 DIAGNOSIS — M9902 Segmental and somatic dysfunction of thoracic region: Secondary | ICD-10-CM | POA: Diagnosis not present

## 2016-06-10 DIAGNOSIS — H16223 Keratoconjunctivitis sicca, not specified as Sjogren's, bilateral: Secondary | ICD-10-CM | POA: Diagnosis not present

## 2016-06-10 DIAGNOSIS — H04123 Dry eye syndrome of bilateral lacrimal glands: Secondary | ICD-10-CM | POA: Diagnosis not present

## 2016-06-11 DIAGNOSIS — M9902 Segmental and somatic dysfunction of thoracic region: Secondary | ICD-10-CM | POA: Diagnosis not present

## 2016-06-12 DIAGNOSIS — M9902 Segmental and somatic dysfunction of thoracic region: Secondary | ICD-10-CM | POA: Diagnosis not present

## 2016-06-16 DIAGNOSIS — M940 Chondrocostal junction syndrome [Tietze]: Secondary | ICD-10-CM | POA: Diagnosis not present

## 2016-06-16 DIAGNOSIS — M9902 Segmental and somatic dysfunction of thoracic region: Secondary | ICD-10-CM | POA: Diagnosis not present

## 2016-06-16 DIAGNOSIS — L4050 Arthropathic psoriasis, unspecified: Secondary | ICD-10-CM | POA: Diagnosis not present

## 2016-06-18 DIAGNOSIS — M9902 Segmental and somatic dysfunction of thoracic region: Secondary | ICD-10-CM | POA: Diagnosis not present

## 2016-06-19 DIAGNOSIS — M9902 Segmental and somatic dysfunction of thoracic region: Secondary | ICD-10-CM | POA: Diagnosis not present

## 2016-06-23 DIAGNOSIS — M9902 Segmental and somatic dysfunction of thoracic region: Secondary | ICD-10-CM | POA: Diagnosis not present

## 2016-06-23 DIAGNOSIS — L4050 Arthropathic psoriasis, unspecified: Secondary | ICD-10-CM | POA: Diagnosis not present

## 2016-06-24 ENCOUNTER — Ambulatory Visit (HOSPITAL_BASED_OUTPATIENT_CLINIC_OR_DEPARTMENT_OTHER): Payer: Commercial Managed Care - HMO | Admitting: Oncology

## 2016-06-24 ENCOUNTER — Other Ambulatory Visit (HOSPITAL_BASED_OUTPATIENT_CLINIC_OR_DEPARTMENT_OTHER): Payer: Commercial Managed Care - HMO

## 2016-06-24 VITALS — BP 112/84 | HR 88 | Temp 98.2°F | Resp 18 | Ht 62.0 in | Wt 273.3 lb

## 2016-06-24 DIAGNOSIS — D509 Iron deficiency anemia, unspecified: Secondary | ICD-10-CM | POA: Diagnosis not present

## 2016-06-24 DIAGNOSIS — N920 Excessive and frequent menstruation with regular cycle: Secondary | ICD-10-CM

## 2016-06-24 DIAGNOSIS — D500 Iron deficiency anemia secondary to blood loss (chronic): Secondary | ICD-10-CM

## 2016-06-24 LAB — CBC WITH DIFFERENTIAL/PLATELET
BASO%: 0.2 % (ref 0.0–2.0)
BASOS ABS: 0 10*3/uL (ref 0.0–0.1)
EOS%: 0.5 % (ref 0.0–7.0)
Eosinophils Absolute: 0.1 10*3/uL (ref 0.0–0.5)
HEMATOCRIT: 38.7 % (ref 34.8–46.6)
HGB: 12.7 g/dL (ref 11.6–15.9)
LYMPH#: 1.5 10*3/uL (ref 0.9–3.3)
LYMPH%: 12.5 % — AB (ref 14.0–49.7)
MCH: 32 pg (ref 25.1–34.0)
MCHC: 32.8 g/dL (ref 31.5–36.0)
MCV: 97.5 fL (ref 79.5–101.0)
MONO#: 0.8 10*3/uL (ref 0.1–0.9)
MONO%: 6.1 % (ref 0.0–14.0)
NEUT#: 10 10*3/uL — ABNORMAL HIGH (ref 1.5–6.5)
NEUT%: 80.7 % — AB (ref 38.4–76.8)
PLATELETS: 320 10*3/uL (ref 145–400)
RBC: 3.97 10*6/uL (ref 3.70–5.45)
RDW: 16.5 % — ABNORMAL HIGH (ref 11.2–14.5)
WBC: 12.4 10*3/uL — ABNORMAL HIGH (ref 3.9–10.3)

## 2016-06-24 NOTE — Progress Notes (Signed)
Hematology and Oncology Follow Up Visit  Dawn Hayes [id number] March 24, 1971 46 y.o. 06/24/2016 3:00 PM Dawn Hayes, MDTabori, Dawn Millet, MD   Principle Diagnosis: 46 year old woman with iron deficiency anemia diagnosed in October 2017. At that time she was noted to have iron levels 25 and ferritin of 6. Her hemoglobin was 10.2. Her iron deficiency is related to menorrhagia.   Prior Therapy:  She is status post IV iron infusion in the form of Feraheme for a total of 1 g completed in November 2018.  She is status post hysterectomy on 04/08/2016.  Current therapy: Observation and surveillance.  Interim History: Dawn Hayes presents today for a follow-up visit. Since the last visit, she completed intravenous iron and tolerated it well. She underwent a hysterectomy which she also tolerated reasonably well. She did develop costochondritis which required steroid orally and occasional injection. Her energy and performance status has been improving and have resumed work-related duties. She denied any hematochezia, melena or any other form of bleeding.  She does not report any headaches, blurry vision, syncope or seizures. She does not report any fevers chills or sweats. She does not report any cough, wheezing or hemoptysis. She does not report any nausea, vomiting or bowel pain. She does not report any frequency urgency or hesitancy. She does not report any skeletal complaints. Remaining review of systems unremarkable.  Medications: I have reviewed the patient's current medications.  Current Outpatient Prescriptions  Medication Sig Dispense Refill  . buPROPion (WELLBUTRIN XL) 150 MG 24 hr tablet Take 1 tablet (150 mg total) by mouth daily. 30 tablet 3  . Cholecalciferol (VITAMIN D PO) Take by mouth.    . clobetasol (TEMOVATE) 0.05 % GEL Apply 1 application topically 2 (two) times daily as needed (for itching/irritation).     Marland Kitchen diclofenac (VOLTAREN) 75 MG EC tablet     . ENBREL SURECLICK 50  MG/ML injection Inject 50 mg into the skin once a week. Tuesday    . esomeprazole (NEXIUM) 40 MG capsule Take 40 mg by mouth daily before breakfast.    . hydrochlorothiazide (HYDRODIURIL) 25 MG tablet TAKE 1 TABLET BY MOUTH EVERY DAY 30 tablet 6  . ibuprofen (ADVIL,MOTRIN) 800 MG tablet Take 800 mg by mouth every 8 (eight) hours as needed for mild pain or moderate pain.    Marland Kitchen lactobacillus acidophilus (BACID) TABS tablet Take 1 tablet by mouth daily.    Marland Kitchen lisinopril (PRINIVIL,ZESTRIL) 10 MG tablet TAKE 1 TABLET BY MOUTH EVERY DAY 30 tablet 6  . Multiple Vitamin (MULTIVITAMIN WITH MINERALS) TABS tablet Take 1 tablet by mouth daily.    Marland Kitchen nystatin (MYCOSTATIN) 100000 UNIT/ML suspension Take 5 mLs (500,000 Units total) by mouth 4 (four) times daily as needed (for thrush). Pt is to swish and spit. 60 mL 2  . Omega-3 Fatty Acids (FISH OIL PO) Take by mouth.    . predniSONE (DELTASONE) 5 MG tablet Take by mouth.    . Pyridoxine HCl (VITAMIN B-6) 500 MG tablet Take 1,000 mg by mouth daily.    . RESTASIS 0.05 % ophthalmic emulsion   4   No current facility-administered medications for this visit.      Allergies:  Allergies  Allergen Reactions  . Aspirin Other (See Comments)    Reaction:  Convulsions   . Toradol [Ketorolac Tromethamine] Nausea And Vomiting  . Adhesive [Tape] Itching and Rash    Past Medical History, Surgical history, Social history, and Family History were reviewed and updated.  Physical Exam: Blood pressure 112/84,  pulse 88, temperature 98.2 F (36.8 C), temperature source Oral, resp. rate 18, height 5\' 2"  (1.575 m), weight 273 lb 4.8 oz (124 kg), last menstrual period 02/28/2016, SpO2 96 %. ECOG: 0 General appearance: alert and cooperative Head: Normocephalic, without obvious abnormality Neck: no adenopathy Lymph nodes: Cervical, supraclavicular, and axillary nodes normal. Heart:regular rate and rhythm, S1, S2 normal, no murmur, click, rub or gallop Lung:chest clear, no  wheezing, rales, normal symmetric air entry Abdomin: soft, non-tender, without masses or organomegaly EXT:no erythema, induration, or nodules   Lab Results: Lab Results  Component Value Date   WBC 12.4 (H) 06/24/2016   HGB 12.7 06/24/2016   HCT 38.7 06/24/2016   MCV 97.5 06/24/2016   PLT 320 06/24/2016     Chemistry      Component Value Date/Time   NA 137 04/16/2016 1338   NA 137 06/13/2014   K 3.8 04/16/2016 1338   CL 101 04/16/2016 1338   CO2 26 04/16/2016 1338   BUN 9 04/16/2016 1338   BUN 16 06/13/2014   CREATININE 0.65 04/16/2016 1338   GLU 90 06/13/2014      Component Value Date/Time   CALCIUM 9.7 04/16/2016 1338   ALKPHOS 59 03/27/2016 1212   AST 26 03/27/2016 1212   ALT 36 03/27/2016 1212   BILITOT 0.7 03/27/2016 1212      Impression and Plan:  46 year old woman with the following issues:  1. Iron deficiency anemia related to chronic menstrual blood losses. She is status post IV iron infusion with correction of her iron deficiency.  Her hemoglobin today appears normal and iron stores are currently pending. I have not recommended any further IV iron infusion at this time unless she develops any worsening iron deficiency in the future.  2. Menorrhagia: Related to irregular menses. She is status post hysterectomy done on 04/08/2016. She tolerated it well and appears to have resolved her symptoms.  3. Follow-up: I'm happy to see her in the future as needed.    [facility], MD 2/13/20183:00 PM

## 2016-06-25 ENCOUNTER — Ambulatory Visit (INDEPENDENT_AMBULATORY_CARE_PROVIDER_SITE_OTHER): Payer: Commercial Managed Care - HMO | Admitting: Family Medicine

## 2016-06-25 VITALS — BP 128/80 | HR 81 | Temp 98.1°F | Resp 17 | Ht 62.0 in | Wt 272.4 lb

## 2016-06-25 DIAGNOSIS — F419 Anxiety disorder, unspecified: Secondary | ICD-10-CM

## 2016-06-25 DIAGNOSIS — F418 Other specified anxiety disorders: Secondary | ICD-10-CM

## 2016-06-25 DIAGNOSIS — F32A Depression, unspecified: Secondary | ICD-10-CM

## 2016-06-25 DIAGNOSIS — F329 Major depressive disorder, single episode, unspecified: Secondary | ICD-10-CM

## 2016-06-25 LAB — IRON AND TIBC
%SAT: 17 % — ABNORMAL LOW (ref 21–57)
IRON: 54 ug/dL (ref 41–142)
TIBC: 319 ug/dL (ref 236–444)
UIBC: 264 ug/dL (ref 120–384)

## 2016-06-25 LAB — FERRITIN: Ferritin: 37 ng/ml (ref 9–269)

## 2016-06-25 MED ORDER — VENLAFAXINE HCL ER 75 MG PO CP24: 75.0000 mg | ORAL_CAPSULE | Freq: Every day | ORAL | 3 refills | Status: DC

## 2016-06-25 NOTE — Assessment & Plan Note (Signed)
Pt had no improvement in sxs on Wellbutrin.  Will stop medication and switch to Effexor as she just had a hysterectomy.  Will titrate meds prn.  Pt expressed understanding and is in agreement w/ plan.

## 2016-06-25 NOTE — Patient Instructions (Signed)
Follow up in 4-6 weeks to recheck mood STOP the Wellbutrin START the Effexor daily- in the morning Call with any questions or concerns Happy Valentine's Day!!!

## 2016-06-25 NOTE — Progress Notes (Signed)
   Subjective:    Patient ID: Dawn Hayes, female    DOB: [birth year], 46 y.o.   MRN: [id number]  HPI Mood- pt was very emotional after her hysterectomy and was started on Wellbutrin at last visit to also assist w/ smoking cessation.  Pt has not noticed any improvement in mood and has not decreased her desire to smoke.  Pt's biggest concern is the emotional lability.     Review of Systems For ROS see HPI     Objective:   Physical Exam  Constitutional: She is oriented to person, place, and time. She appears well-developed and well-nourished. No distress.  obese  Neurological: She is alert and oriented to person, place, and time.  Skin: Skin is warm and dry.  Psychiatric: She has a normal mood and affect. Her behavior is normal. Thought content normal.  Vitals reviewed.         Assessment & Plan:

## 2016-06-26 DIAGNOSIS — M9902 Segmental and somatic dysfunction of thoracic region: Secondary | ICD-10-CM | POA: Diagnosis not present

## 2016-06-30 ENCOUNTER — Encounter: Payer: Self-pay | Admitting: Family Medicine

## 2016-06-30 DIAGNOSIS — M9902 Segmental and somatic dysfunction of thoracic region: Secondary | ICD-10-CM | POA: Diagnosis not present

## 2016-06-30 MED ORDER — DULOXETINE HCL 20 MG PO CPEP: 20.0000 mg | ORAL_CAPSULE | Freq: Every day | ORAL | 3 refills | Status: DC

## 2016-07-01 DIAGNOSIS — L4050 Arthropathic psoriasis, unspecified: Secondary | ICD-10-CM | POA: Diagnosis not present

## 2016-07-03 DIAGNOSIS — M9902 Segmental and somatic dysfunction of thoracic region: Secondary | ICD-10-CM | POA: Diagnosis not present

## 2016-07-07 DIAGNOSIS — M9902 Segmental and somatic dysfunction of thoracic region: Secondary | ICD-10-CM | POA: Diagnosis not present

## 2016-07-09 ENCOUNTER — Encounter: Payer: Self-pay | Admitting: Family Medicine

## 2016-07-09 ENCOUNTER — Ambulatory Visit (INDEPENDENT_AMBULATORY_CARE_PROVIDER_SITE_OTHER): Payer: Commercial Managed Care - HMO | Admitting: Family Medicine

## 2016-07-09 VITALS — BP 124/86 | HR 97 | Temp 98.5°F | Resp 16 | Ht 62.0 in | Wt 269.0 lb

## 2016-07-09 DIAGNOSIS — R062 Wheezing: Secondary | ICD-10-CM

## 2016-07-09 DIAGNOSIS — J0100 Acute maxillary sinusitis, unspecified: Secondary | ICD-10-CM | POA: Diagnosis not present

## 2016-07-09 MED ORDER — AMOXICILLIN 875 MG PO TABS: 875.0000 mg | ORAL_TABLET | Freq: Two times a day (BID) | ORAL | 0 refills | Status: DC

## 2016-07-09 MED ORDER — IPRATROPIUM-ALBUTEROL 0.5-2.5 (3) MG/3ML IN SOLN
3.0000 mL | Freq: Once | RESPIRATORY_TRACT | Status: AC
  Administered 2016-07-09: 3 mL via RESPIRATORY_TRACT

## 2016-07-09 MED ORDER — GUAIFENESIN-CODEINE 100-10 MG/5ML PO SYRP: 10.0000 mL | ORAL_SOLUTION | Freq: Three times a day (TID) | ORAL | 0 refills | Status: DC | PRN

## 2016-07-09 MED ORDER — ALBUTEROL SULFATE HFA 108 (90 BASE) MCG/ACT IN AERS: 2.0000 | INHALATION_SPRAY | Freq: Four times a day (QID) | RESPIRATORY_TRACT | 2 refills | Status: DC | PRN

## 2016-07-09 NOTE — Patient Instructions (Signed)
Follow up as needed/scheduled Start the Amoxicillin twice daily- take w/ food Drink plenty of fluids REST! Use the codeine cough syrup as needed- may cause drowsiness Mucinex DM for daytime cough and congestion w/o drowsiness Use the albuterol inhaler- 2 puffs every 4-6 hrs as needed for cough, shortness of breath, wheezing Call with any questions or concerns Hang in there!!!

## 2016-07-09 NOTE — Progress Notes (Signed)
   Subjective:    Patient ID: Dawn Hayes, female    DOB: 11/19/1970, 46 y.o.   MRN: [id number]  HPI URI- sxs started Sunday.  Yesterday was the worst, somewhat better today.  Coughing has caused costochondritis to flare.  + wheezing, increased SOB.  + sore throat.  + HA, nasal congestion.  Bilateral ear fullness.  No fevers.  + sick contacts.   Review of Systems For ROS see HPI     Objective:   Physical Exam  Constitutional: She appears well-developed and well-nourished. No distress.  HENT:  Head: Normocephalic and atraumatic.  Right Ear: Tympanic membrane normal.  Left Ear: Tympanic membrane normal.  Nose: Mucosal edema and rhinorrhea present. Right sinus exhibits maxillary sinus tenderness and frontal sinus tenderness. Left sinus exhibits maxillary sinus tenderness and frontal sinus tenderness.  Mouth/Throat: Uvula is midline and mucous membranes are normal. Posterior oropharyngeal erythema present. No oropharyngeal exudate.  Eyes: Conjunctivae and EOM are normal. Pupils are equal, round, and reactive to light.  Neck: Normal range of motion. Neck supple.  Cardiovascular: Normal rate, regular rhythm and normal heart sounds.   Pulmonary/Chest: Effort normal. No respiratory distress. She has wheezes (expiratory wheezes, R>L improved s/p neb tx).  Lymphadenopathy:    She has no cervical adenopathy.  Vitals reviewed.         Assessment & Plan:  Acute maxillary sinusitis- new.  Pt's sxs and PE consistent w/ infxn.  Start abx.  Pt is wheezing on exam- improved s/p neb tx.  Prescription for albuterol given.  Cough meds prn.  Reviewed supportive care and red flags that should prompt return.  Pt expressed understanding and is in agreement w/ plan.

## 2016-07-09 NOTE — Progress Notes (Signed)
Pre visit review using our clinic review tool, if applicable. No additional management support is needed unless otherwise documented below in the visit note. 

## 2016-07-10 ENCOUNTER — Other Ambulatory Visit: Payer: Self-pay | Admitting: Family Medicine

## 2016-07-10 ENCOUNTER — Telehealth: Payer: Self-pay | Admitting: Family Medicine

## 2016-07-10 DIAGNOSIS — M9902 Segmental and somatic dysfunction of thoracic region: Secondary | ICD-10-CM | POA: Diagnosis not present

## 2016-07-10 MED ORDER — OSELTAMIVIR PHOSPHATE 75 MG PO CAPS: 75.0000 mg | ORAL_CAPSULE | Freq: Two times a day (BID) | ORAL | 0 refills | Status: DC

## 2016-07-10 NOTE — Telephone Encounter (Signed)
Pt did not have flu like sxs when she was here yesterday but due to her recent exposure, we can send in prescription for Tamiflu 75mg  twice daily if she desires.  But she still needs to take the antibiotics from yesterday

## 2016-07-10 NOTE — Telephone Encounter (Signed)
Patient states she just left doctor's office with her husband who was diagnosed with strand A & B of the flu virus.  She states she was seen in the office yesterday but was not tested for flu.  Since she has been exposed she wants to know what pcp recommends for treatment.  Pharmacy: [facility] [place], [place] - [address] [phone] (Phone) [phone] (Fax)

## 2016-07-10 NOTE — Telephone Encounter (Signed)
Pt notified and med was filled to the pharmacy, as requested.

## 2016-07-17 DIAGNOSIS — M9902 Segmental and somatic dysfunction of thoracic region: Secondary | ICD-10-CM | POA: Diagnosis not present

## 2016-07-23 DIAGNOSIS — M9902 Segmental and somatic dysfunction of thoracic region: Secondary | ICD-10-CM | POA: Diagnosis not present

## 2016-07-28 DIAGNOSIS — M9902 Segmental and somatic dysfunction of thoracic region: Secondary | ICD-10-CM | POA: Diagnosis not present

## 2016-07-29 DIAGNOSIS — L4050 Arthropathic psoriasis, unspecified: Secondary | ICD-10-CM | POA: Diagnosis not present

## 2016-07-31 DIAGNOSIS — M9902 Segmental and somatic dysfunction of thoracic region: Secondary | ICD-10-CM | POA: Diagnosis not present

## 2016-08-01 ENCOUNTER — Other Ambulatory Visit: Payer: Self-pay | Admitting: Gastroenterology

## 2016-08-13 DIAGNOSIS — M9902 Segmental and somatic dysfunction of thoracic region: Secondary | ICD-10-CM | POA: Diagnosis not present

## 2016-08-20 ENCOUNTER — Encounter: Payer: Self-pay | Admitting: Family Medicine

## 2016-08-20 ENCOUNTER — Ambulatory Visit (INDEPENDENT_AMBULATORY_CARE_PROVIDER_SITE_OTHER): Payer: Commercial Managed Care - HMO | Admitting: Family Medicine

## 2016-08-20 VITALS — BP 118/64 | HR 96 | Temp 98.1°F | Resp 17 | Ht 62.0 in | Wt 271.1 lb

## 2016-08-20 DIAGNOSIS — G8929 Other chronic pain: Secondary | ICD-10-CM | POA: Diagnosis not present

## 2016-08-20 DIAGNOSIS — M25512 Pain in left shoulder: Secondary | ICD-10-CM

## 2016-08-20 MED ORDER — NYSTATIN 100000 UNIT/ML MT SUSP: 5.0000 mL | Freq: Four times a day (QID) | OROMUCOSAL | 0 refills | Status: DC | PRN

## 2016-08-20 NOTE — Progress Notes (Signed)
Pre visit review using our clinic review tool, if applicable. No additional management support is needed unless otherwise documented below in the visit note. 

## 2016-08-20 NOTE — Progress Notes (Signed)
   Subjective:    Patient ID: Dawn Hayes, female    DOB: 30-Jun-1970, 46 y.o.   MRN: [id number]  HPI Shoulder pain- L shoulder pain, 'it comes and goes'.  Since scheduling appt sxs have primarily resolved.  Is currently on Prednisone- 20mg  daily (has been on this since December).  Pt has appt next week w/ Rheumatology.  sxs started after surgery in November and has been intermittent since then.  Pt feels it may be part of her 'costochondritis'.  When pain flares, it hurts w/ overhead motion.  Today is asymptomatic.     Review of Systems For ROS see HPI     Objective:   Physical Exam  Constitutional: She is oriented to person, place, and time. She appears well-developed and well-nourished. No distress.  obese  Musculoskeletal: She exhibits no edema, tenderness (no TTP over L clavicle, AC joint, or scapula) or deformity.  Full ROM of L shoulder but pain w/ internal rotation and forward flexion above 90  Neurological: She is alert and oriented to person, place, and time.  Skin: Skin is warm and dry.  Psychiatric: She has a normal mood and affect. Her behavior is normal. Thought content normal.  Vitals reviewed.         Assessment & Plan:  L shoulder pain- ongoing since her GYN surgery in November.  She was inverted during this procedure and is not sure if she injured herself under anesthesia.  Already on Prednisone.  Refer to Ortho for complete evaluation and tx.

## 2016-08-20 NOTE — Patient Instructions (Signed)
Follow up as needed/scheduled We'll call you with your Ortho appt Ice your shoulder as needed for pain Call with any questions or concerns Hang in there!!!

## 2016-08-21 DIAGNOSIS — M9902 Segmental and somatic dysfunction of thoracic region: Secondary | ICD-10-CM | POA: Diagnosis not present

## 2016-08-25 ENCOUNTER — Ambulatory Visit (INDEPENDENT_AMBULATORY_CARE_PROVIDER_SITE_OTHER): Payer: Commercial Managed Care - HMO | Admitting: Family Medicine

## 2016-08-25 ENCOUNTER — Encounter: Payer: Self-pay | Admitting: Family Medicine

## 2016-08-25 VITALS — BP 115/85 | HR 106 | Temp 98.1°F | Resp 18 | Ht 62.0 in | Wt 272.1 lb

## 2016-08-25 DIAGNOSIS — R059 Cough, unspecified: Secondary | ICD-10-CM

## 2016-08-25 DIAGNOSIS — R05 Cough: Secondary | ICD-10-CM | POA: Diagnosis not present

## 2016-08-25 DIAGNOSIS — J0100 Acute maxillary sinusitis, unspecified: Secondary | ICD-10-CM

## 2016-08-25 MED ORDER — AMOXICILLIN 875 MG PO TABS: 875.0000 mg | ORAL_TABLET | Freq: Two times a day (BID) | ORAL | 0 refills | Status: DC

## 2016-08-25 MED ORDER — IPRATROPIUM-ALBUTEROL 0.5-2.5 (3) MG/3ML IN SOLN
3.0000 mL | Freq: Once | RESPIRATORY_TRACT | Status: AC
  Administered 2016-08-25: 3 mL via RESPIRATORY_TRACT

## 2016-08-25 MED ORDER — PROMETHAZINE-DM 6.25-15 MG/5ML PO SYRP: 5.0000 mL | ORAL_SOLUTION | Freq: Four times a day (QID) | ORAL | 0 refills | Status: DC | PRN

## 2016-08-25 NOTE — Progress Notes (Signed)
   Subjective:    Patient ID: Dawn Hayes, female    DOB: 10/17/1970, 46 y.o.   MRN: [id number]  HPI URI- sxs 1st appeared 1-2 weeks ago but thought she was improving.  2-3 days ago developed 'painful cough and congestion' in her chest.  Using albuterol inhaler due to wheezing and SOB.  No current fevers.  Still on Prednisone 20mg  daily.  Mild maxillary sinus pressure.  No HA.  No personal hx of asthma.   Review of Systems For ROS see HPI     Objective:   Physical Exam  Constitutional: She is oriented to person, place, and time. She appears well-developed and well-nourished. No distress.  HENT:  Head: Normocephalic and atraumatic.  Right Ear: Tympanic membrane normal.  Left Ear: Tympanic membrane normal.  Nose: Mucosal edema and rhinorrhea present. Right sinus exhibits maxillary sinus tenderness and frontal sinus tenderness. Left sinus exhibits maxillary sinus tenderness and frontal sinus tenderness.  Mouth/Throat: Uvula is midline and mucous membranes are normal. Posterior oropharyngeal erythema present. No oropharyngeal exudate.  Eyes: Conjunctivae and EOM are normal. Pupils are equal, round, and reactive to light.  Neck: Normal range of motion. Neck supple.  Cardiovascular: Normal rate, regular rhythm and normal heart sounds.   Pulmonary/Chest: Effort normal and breath sounds normal. No respiratory distress. She has no wheezes.  Decreased air movement diffusely- improved s/p neb tx in office + dry cough  Lymphadenopathy:    She has no cervical adenopathy.  Neurological: She is alert and oriented to person, place, and time.  Skin: Skin is warm and dry.  Psychiatric: She has a normal mood and affect. Her behavior is normal. Thought content normal.  Vitals reviewed.         Assessment & Plan:  Sinusitis- pt w/ TTP over both frontal and maxillary sinuses.  Given sxs 2 weeks ago and then improvement, this is likely a 2nd sickening.  Given her rheumatologic issues and current  use of Prednisone, she is more predisposed to bacterial infxn.  Start abx.  Cough and air movement improved s/p duoneb in office.  Continue albuterol prn.  Cough meds prn.  Reviewed supportive care and red flags that should prompt return.  Pt expressed understanding and is in agreement w/ plan.

## 2016-08-25 NOTE — Progress Notes (Signed)
Pre visit review using our clinic review tool, if applicable. No additional management support is needed unless otherwise documented below in the visit note. 

## 2016-08-25 NOTE — Patient Instructions (Signed)
Follow up as needed/scheduled Start the Amoxicillin twice daily for the sinus infection Drink plenty of fluids Mucinex DM for daytime cough Cough syrup for nights/weekends- may cause drowsiness Continue to use the albuterol inhaler as needed Call with any questions or concerns Hang in there!!!

## 2016-08-27 DIAGNOSIS — M940 Chondrocostal junction syndrome [Tietze]: Secondary | ICD-10-CM | POA: Diagnosis not present

## 2016-08-27 DIAGNOSIS — L4050 Arthropathic psoriasis, unspecified: Secondary | ICD-10-CM | POA: Diagnosis not present

## 2016-09-03 ENCOUNTER — Encounter: Payer: Self-pay | Admitting: Family Medicine

## 2016-09-03 MED ORDER — DOXYCYCLINE HYCLATE 100 MG PO TABS: 100.0000 mg | ORAL_TABLET | Freq: Two times a day (BID) | ORAL | 0 refills | Status: DC

## 2016-09-10 DIAGNOSIS — M67912 Unspecified disorder of synovium and tendon, left shoulder: Secondary | ICD-10-CM | POA: Diagnosis not present

## 2016-09-10 DIAGNOSIS — M67911 Unspecified disorder of synovium and tendon, right shoulder: Secondary | ICD-10-CM | POA: Diagnosis not present

## 2016-09-15 DIAGNOSIS — M25511 Pain in right shoulder: Secondary | ICD-10-CM | POA: Diagnosis not present

## 2016-09-15 DIAGNOSIS — M25512 Pain in left shoulder: Secondary | ICD-10-CM | POA: Diagnosis not present

## 2016-09-18 DIAGNOSIS — M25511 Pain in right shoulder: Secondary | ICD-10-CM | POA: Diagnosis not present

## 2016-09-18 DIAGNOSIS — M25512 Pain in left shoulder: Secondary | ICD-10-CM | POA: Diagnosis not present

## 2016-09-22 DIAGNOSIS — M25511 Pain in right shoulder: Secondary | ICD-10-CM | POA: Diagnosis not present

## 2016-09-22 DIAGNOSIS — M25512 Pain in left shoulder: Secondary | ICD-10-CM | POA: Diagnosis not present

## 2016-09-23 DIAGNOSIS — L4050 Arthropathic psoriasis, unspecified: Secondary | ICD-10-CM | POA: Diagnosis not present

## 2016-09-29 DIAGNOSIS — M9902 Segmental and somatic dysfunction of thoracic region: Secondary | ICD-10-CM | POA: Diagnosis not present

## 2016-09-29 DIAGNOSIS — M25511 Pain in right shoulder: Secondary | ICD-10-CM | POA: Diagnosis not present

## 2016-09-29 DIAGNOSIS — M25512 Pain in left shoulder: Secondary | ICD-10-CM | POA: Diagnosis not present

## 2016-10-01 DIAGNOSIS — M25511 Pain in right shoulder: Secondary | ICD-10-CM | POA: Diagnosis not present

## 2016-10-01 DIAGNOSIS — M25512 Pain in left shoulder: Secondary | ICD-10-CM | POA: Diagnosis not present

## 2016-10-03 ENCOUNTER — Other Ambulatory Visit: Payer: Self-pay | Admitting: Family Medicine

## 2016-10-10 DIAGNOSIS — M25511 Pain in right shoulder: Secondary | ICD-10-CM | POA: Diagnosis not present

## 2016-10-10 DIAGNOSIS — M25512 Pain in left shoulder: Secondary | ICD-10-CM | POA: Diagnosis not present

## 2016-10-15 DIAGNOSIS — M25512 Pain in left shoulder: Secondary | ICD-10-CM | POA: Diagnosis not present

## 2016-10-15 DIAGNOSIS — M25511 Pain in right shoulder: Secondary | ICD-10-CM | POA: Diagnosis not present

## 2016-11-03 ENCOUNTER — Ambulatory Visit (INDEPENDENT_AMBULATORY_CARE_PROVIDER_SITE_OTHER): Payer: 59 | Admitting: Family Medicine

## 2016-11-03 ENCOUNTER — Encounter: Payer: Self-pay | Admitting: Family Medicine

## 2016-11-03 VITALS — BP 112/81 | HR 104 | Temp 98.3°F | Resp 16 | Ht 62.0 in | Wt 279.2 lb

## 2016-11-03 DIAGNOSIS — R29898 Other symptoms and signs involving the musculoskeletal system: Secondary | ICD-10-CM | POA: Diagnosis not present

## 2016-11-03 DIAGNOSIS — M940 Chondrocostal junction syndrome [Tietze]: Secondary | ICD-10-CM | POA: Diagnosis not present

## 2016-11-03 DIAGNOSIS — R251 Tremor, unspecified: Secondary | ICD-10-CM

## 2016-11-03 DIAGNOSIS — L4050 Arthropathic psoriasis, unspecified: Secondary | ICD-10-CM | POA: Diagnosis not present

## 2016-11-03 LAB — CBC WITH DIFFERENTIAL/PLATELET
BASOS ABS: 0.1 10*3/uL (ref 0.0–0.1)
BASOS PCT: 0.5 % (ref 0.0–3.0)
EOS ABS: 0.1 10*3/uL (ref 0.0–0.7)
Eosinophils Relative: 0.9 % (ref 0.0–5.0)
HCT: 40.4 % (ref 36.0–46.0)
HEMOGLOBIN: 13.6 g/dL (ref 12.0–15.0)
LYMPHS PCT: 28.2 % (ref 12.0–46.0)
Lymphs Abs: 2.9 10*3/uL (ref 0.7–4.0)
MCHC: 33.6 g/dL (ref 30.0–36.0)
MCV: 97.4 fl (ref 78.0–100.0)
Monocytes Absolute: 0.7 10*3/uL (ref 0.1–1.0)
Monocytes Relative: 7 % (ref 3.0–12.0)
NEUTROS ABS: 6.5 10*3/uL (ref 1.4–7.7)
Neutrophils Relative %: 63.4 % (ref 43.0–77.0)
PLATELETS: 266 10*3/uL (ref 150.0–400.0)
RBC: 4.15 Mil/uL (ref 3.87–5.11)
RDW: 15.2 % (ref 11.5–15.5)
WBC: 10.3 10*3/uL (ref 4.0–10.5)

## 2016-11-03 LAB — BASIC METABOLIC PANEL
BUN: 14 mg/dL (ref 6–23)
CALCIUM: 9.8 mg/dL (ref 8.4–10.5)
CHLORIDE: 98 meq/L (ref 96–112)
CO2: 29 meq/L (ref 19–32)
CREATININE: 0.8 mg/dL (ref 0.40–1.20)
GFR: 82.06 mL/min (ref >60.00)
GLUCOSE: 111 mg/dL — AB (ref 70–99)
Potassium: 3.1 mEq/L — ABNORMAL LOW (ref 3.5–5.1)
Sodium: 136 mEq/L (ref 135–145)

## 2016-11-03 LAB — HEPATIC FUNCTION PANEL
ALBUMIN: 4 g/dL (ref 3.5–5.2)
ALT: 53 U/L — AB (ref 0–35)
AST: 33 U/L (ref 0–37)
Alkaline Phosphatase: 54 U/L (ref 39–117)
BILIRUBIN DIRECT: 0.1 mg/dL (ref 0.0–0.3)
TOTAL PROTEIN: 6.5 g/dL (ref 6.0–8.3)
Total Bilirubin: 0.6 mg/dL (ref 0.2–1.2)

## 2016-11-03 LAB — TSH: TSH: 1.54 u[IU]/mL (ref 0.35–4.50)

## 2016-11-03 LAB — CK: CK TOTAL: 40 U/L (ref 7–177)

## 2016-11-03 NOTE — Progress Notes (Signed)
Pre visit review using our clinic review tool, if applicable. No additional management support is needed unless otherwise documented below in the visit note. 

## 2016-11-03 NOTE — Progress Notes (Signed)
   Subjective:    Patient ID: Dawn Hayes, female    DOB: 22-Jul-1970, 46 y.o.   MRN: [id number]  HPI 'i have not felt good in months'- pt has psoriatic arthritis (Dr Amil Amen) and was previously on Simponi infusions that were ineffective.  Just started Cosentyx.  Pt reports she is having bilateral leg weakness- particularly w/ rising to a standing position.  She feels the weakness starts in the thighs and radiates downward.  Pt reports some shaking of hands.  Currently on 20mg  prednisone daily.  Pt spoke w/ Rheum earlier today about the leg weakness and 'he didn't have much to say'.     Review of Systems For ROS see HPI     Objective:   Physical Exam  Constitutional: She is oriented to person, place, and time. She appears well-developed and well-nourished.  Morbidly obese  HENT:  Head: Normocephalic and atraumatic.  Neck: Normal range of motion. Neck supple.  Cardiovascular: Intact distal pulses.   Musculoskeletal: She exhibits no edema or tenderness.  Neurological: She is alert and oriented to person, place, and time. No cranial nerve deficit. Coordination normal.  Fine tremor of hands bilaterally  Skin: Skin is warm and dry. Rash (large, violacious psoriatic plaques covering arms and legs) noted.  Psychiatric: She has a normal mood and affect. Her behavior is normal. Thought content normal.  Vitals reviewed.         Assessment & Plan:  Leg weakness- new.  I suspect that this is due to her psoriatic arthritis but we must r/o other possibilities.  Check CK level to r/o muscle breakdown, TSH level to r/o abnormality, CBC to r/o anemia.  Encouraged lots of fluids to avoid dehydration- particularly in the setting of daily Prednisone.  Stressed need to discuss this w/ rheumatology  Tremor- new.  Suspect this is due to her daily Prednisone use but will check labs to r/o other metabolic causes.  Will follow closely.

## 2016-11-03 NOTE — Patient Instructions (Signed)
Follow up as needed/scheduled We'll notify you of your lab results and make any changes if needed Continue to drink plenty of fluids to prevent dehydration (especially in the setting of Prednisone) Change positions slowly Call with any questions or concerns- particularly if symptoms change/worsen Hang in there!!!

## 2016-11-04 ENCOUNTER — Other Ambulatory Visit: Payer: Self-pay | Admitting: Family Medicine

## 2016-11-04 ENCOUNTER — Other Ambulatory Visit: Payer: Self-pay | Admitting: General Practice

## 2016-11-04 DIAGNOSIS — R7989 Other specified abnormal findings of blood chemistry: Secondary | ICD-10-CM

## 2016-11-04 DIAGNOSIS — R945 Abnormal results of liver function studies: Secondary | ICD-10-CM

## 2016-11-04 DIAGNOSIS — E876 Hypokalemia: Secondary | ICD-10-CM

## 2016-11-04 MED ORDER — POTASSIUM CHLORIDE CRYS ER 20 MEQ PO TBCR: 20.0000 meq | EXTENDED_RELEASE_TABLET | Freq: Every day | ORAL | 3 refills | Status: DC

## 2016-11-18 ENCOUNTER — Other Ambulatory Visit (INDEPENDENT_AMBULATORY_CARE_PROVIDER_SITE_OTHER): Payer: 59

## 2016-11-18 DIAGNOSIS — E876 Hypokalemia: Secondary | ICD-10-CM | POA: Diagnosis not present

## 2016-11-18 DIAGNOSIS — R945 Abnormal results of liver function studies: Secondary | ICD-10-CM

## 2016-11-18 DIAGNOSIS — R7989 Other specified abnormal findings of blood chemistry: Secondary | ICD-10-CM

## 2016-11-18 LAB — HEPATIC FUNCTION PANEL
ALT: 47 U/L — AB (ref 0–35)
AST: 31 U/L (ref 0–37)
Albumin: 4.1 g/dL (ref 3.5–5.2)
Alkaline Phosphatase: 66 U/L (ref 39–117)
Bilirubin, Direct: 0.1 mg/dL (ref 0.0–0.3)
TOTAL PROTEIN: 7.1 g/dL (ref 6.0–8.3)
Total Bilirubin: 0.3 mg/dL (ref 0.2–1.2)

## 2016-11-18 LAB — POTASSIUM: Potassium: 3.9 mEq/L (ref 3.5–5.1)

## 2016-12-05 ENCOUNTER — Ambulatory Visit: Payer: 59 | Admitting: Family Medicine

## 2016-12-08 ENCOUNTER — Encounter: Payer: Self-pay | Admitting: Family Medicine

## 2016-12-08 ENCOUNTER — Ambulatory Visit (INDEPENDENT_AMBULATORY_CARE_PROVIDER_SITE_OTHER): Payer: 59 | Admitting: Family Medicine

## 2016-12-08 VITALS — BP 121/83 | HR 100 | Resp 16 | Ht 62.0 in | Wt 275.5 lb

## 2016-12-08 DIAGNOSIS — M545 Low back pain, unspecified: Secondary | ICD-10-CM

## 2016-12-08 DIAGNOSIS — R29898 Other symptoms and signs involving the musculoskeletal system: Secondary | ICD-10-CM

## 2016-12-08 DIAGNOSIS — L4050 Arthropathic psoriasis, unspecified: Secondary | ICD-10-CM

## 2016-12-08 MED ORDER — DICLOFENAC SODIUM 1 % TD GEL
4.0000 g | Freq: Four times a day (QID) | TRANSDERMAL | 1 refills | Status: DC
Start: 2016-12-08 — End: 2017-06-03

## 2016-12-08 NOTE — Progress Notes (Signed)
Pre visit review using our clinic review tool, if applicable. No additional management support is needed unless otherwise documented below in the visit note. 

## 2016-12-08 NOTE — Progress Notes (Signed)
   Subjective:    Patient ID: Dawn Hayes, female    DOB: 02/27/1971, 46 y.o.   MRN: [id number]  HPI Leg weakness- pt reports ongoing leg weakness despite K+ supplement.  LBP 'has gotten so bad that I can't even walk the grocery store'.  Pain does not radiate.  No bowel or bladder incontinence.  Currently on Prednisone 20mg  daily.  Pt does have psoriatic arthritis and is following w/ Dr Amil Amen.     Review of Systems For ROS see HPI     Objective:   Physical Exam  Constitutional: She is oriented to person, place, and time. She appears well-developed and well-nourished. No distress.  obese  Cardiovascular: Intact distal pulses.   Musculoskeletal: She exhibits tenderness (mild TTP across lower back, no point TTP). She exhibits no edema.  Neurological: She is alert and oriented to person, place, and time. She has normal reflexes. No cranial nerve deficit. Coordination normal.  (-) SLR bilaterally  Skin: Skin is warm and dry.  Psychiatric: She has a normal mood and affect. Her behavior is normal. Thought content normal.  Vitals reviewed.         Assessment & Plan:  Lumbar back pain- given pt's LBP and ongoing leg weakness will get MRI to assess for structural abnormality.  Encouraged her to take her available tramadol and will start Voltaren gel for pain relief.  Reviewed supportive care and red flags that should prompt return.  Pt expressed understanding and is in agreement w/ plan.

## 2016-12-08 NOTE — Assessment & Plan Note (Signed)
Stressed need for healthy diet and regular exercise.  Pt recently started [employer] and is down 4 lbs.  Applauded her efforts.  Will follow.

## 2016-12-08 NOTE — Patient Instructions (Addendum)
Follow up as needed/scheduled We'll call you with your Ortho and MRI appts Continue the Prednisone daily as directed Apply heat to the low back for pain relief Apply the Voltaren gel to the low back to help w/ pain Use the Tramdol from Dr Amil Amen for pain We'll send a copy of today's note to Dr Amil Amen Call with any questions or concerns Hang in there!!!

## 2016-12-08 NOTE — Assessment & Plan Note (Signed)
Chronic problem.  Following w/ Dr Amil Amen.  Back pain and leg weakness may be related to her arthritis but will get MRI to r/o other issues.

## 2016-12-09 ENCOUNTER — Telehealth: Payer: Self-pay | Admitting: Family Medicine

## 2016-12-09 NOTE — Telephone Encounter (Signed)
Pt states that she is scheduled for an MRI on 8/11 and is asking for something to relax her or even put her to sleep due to being claustrophobic, please advise

## 2016-12-10 MED ORDER — ALPRAZOLAM 0.5 MG PO TABS: ORAL_TABLET | ORAL | 0 refills | Status: DC

## 2016-12-10 NOTE — Telephone Encounter (Signed)
Please advise 

## 2016-12-10 NOTE — Telephone Encounter (Signed)
Ok for Alprazolam 0.5mg  1 tab prior to MRI #1 (should have someone drive her home due to risk of ongoing sedation)

## 2016-12-10 NOTE — Telephone Encounter (Signed)
Pt informed and med sent to local pharmacy as requested.

## 2016-12-15 DIAGNOSIS — M5416 Radiculopathy, lumbar region: Secondary | ICD-10-CM | POA: Diagnosis not present

## 2016-12-15 DIAGNOSIS — M545 Low back pain: Secondary | ICD-10-CM | POA: Diagnosis not present

## 2016-12-20 ENCOUNTER — Ambulatory Visit
Admission: RE | Admit: 2016-12-20 | Discharge: 2016-12-20 | Disposition: A | Discharge status: Home / Self Care | Payer: 59 | Source: Ambulatory Visit | Attending: Family Medicine | Admitting: Family Medicine

## 2016-12-20 DIAGNOSIS — M545 Low back pain, unspecified: Secondary | ICD-10-CM

## 2016-12-20 DIAGNOSIS — R29898 Other symptoms and signs involving the musculoskeletal system: Secondary | ICD-10-CM

## 2016-12-20 DIAGNOSIS — M5136 Other intervertebral disc degeneration, lumbar region: Secondary | ICD-10-CM | POA: Diagnosis not present

## 2016-12-22 ENCOUNTER — Other Ambulatory Visit: Payer: Self-pay | Admitting: Family Medicine

## 2016-12-22 DIAGNOSIS — D1779 Benign lipomatous neoplasm of other sites: Secondary | ICD-10-CM

## 2016-12-22 DIAGNOSIS — M5124 Other intervertebral disc displacement, thoracic region: Secondary | ICD-10-CM

## 2016-12-24 ENCOUNTER — Other Ambulatory Visit: Payer: Self-pay | Admitting: Orthopedic Surgery

## 2016-12-24 DIAGNOSIS — D1779 Benign lipomatous neoplasm of other sites: Secondary | ICD-10-CM | POA: Diagnosis not present

## 2016-12-24 DIAGNOSIS — M5124 Other intervertebral disc displacement, thoracic region: Secondary | ICD-10-CM | POA: Diagnosis not present

## 2016-12-24 DIAGNOSIS — M48061 Spinal stenosis, lumbar region without neurogenic claudication: Secondary | ICD-10-CM

## 2016-12-29 ENCOUNTER — Other Ambulatory Visit: Payer: Self-pay | Admitting: Family Medicine

## 2017-01-01 DIAGNOSIS — M545 Low back pain: Secondary | ICD-10-CM | POA: Diagnosis not present

## 2017-01-01 DIAGNOSIS — D1779 Benign lipomatous neoplasm of other sites: Secondary | ICD-10-CM | POA: Diagnosis not present

## 2017-01-04 ENCOUNTER — Other Ambulatory Visit: Payer: 59

## 2017-01-05 DIAGNOSIS — M545 Low back pain: Secondary | ICD-10-CM | POA: Diagnosis not present

## 2017-01-05 DIAGNOSIS — D1779 Benign lipomatous neoplasm of other sites: Secondary | ICD-10-CM | POA: Diagnosis not present

## 2017-01-07 DIAGNOSIS — M940 Chondrocostal junction syndrome [Tietze]: Secondary | ICD-10-CM | POA: Diagnosis not present

## 2017-01-07 DIAGNOSIS — L4050 Arthropathic psoriasis, unspecified: Secondary | ICD-10-CM | POA: Diagnosis not present

## 2017-01-28 ENCOUNTER — Other Ambulatory Visit: Payer: Self-pay | Admitting: Nurse Practitioner

## 2017-01-28 DIAGNOSIS — D1779 Benign lipomatous neoplasm of other sites: Secondary | ICD-10-CM | POA: Diagnosis not present

## 2017-01-28 DIAGNOSIS — M545 Low back pain: Secondary | ICD-10-CM | POA: Diagnosis not present

## 2017-01-29 ENCOUNTER — Ambulatory Visit (INDEPENDENT_AMBULATORY_CARE_PROVIDER_SITE_OTHER): Payer: 59 | Admitting: Family Medicine

## 2017-01-29 ENCOUNTER — Encounter: Payer: Self-pay | Admitting: Family Medicine

## 2017-01-29 VITALS — BP 122/82 | HR 83 | Temp 98.3°F | Resp 17 | Ht 62.0 in | Wt 274.2 lb

## 2017-01-29 DIAGNOSIS — J9801 Acute bronchospasm: Secondary | ICD-10-CM

## 2017-01-29 MED ORDER — ALBUTEROL SULFATE (2.5 MG/3ML) 0.083% IN NEBU
2.5000 mg | INHALATION_SOLUTION | Freq: Once | RESPIRATORY_TRACT | Status: AC
  Administered 2017-01-29: 2.5 mg via RESPIRATORY_TRACT

## 2017-01-29 MED ORDER — PREDNISONE 10 MG PO TABS: ORAL_TABLET | ORAL | 0 refills | Status: DC

## 2017-01-29 NOTE — Progress Notes (Signed)
   Subjective:    Patient ID: Dawn Hayes, female    DOB: 1971-04-02, 46 y.o.   MRN: [id number]  HPI Cough- sxs started 3 weeks ago w/ a sore throat.  Reports other sxs have improved but cough and wheeze remain.  No fevers.  Cough is not productive.  + sick contacts.  No hx of asthma.  Mild SOB.  Back pain due to cough.  Currently on 7mg  of Prednisone.  Review of Systems For ROS see HPI     Objective:   Physical Exam  Constitutional: She appears well-developed and well-nourished. No distress.  HENT:  Head: Normocephalic and atraumatic.  Right Ear: Tympanic membrane normal.  Left Ear: Tympanic membrane normal.  Nose: Mucosal edema and rhinorrhea present. Right sinus exhibits no maxillary sinus tenderness and no frontal sinus tenderness. Left sinus exhibits no maxillary sinus tenderness and no frontal sinus tenderness.  Mouth/Throat: Mucous membranes are normal. Posterior oropharyngeal erythema (w/ PND) present.  Eyes: Pupils are equal, round, and reactive to light. Conjunctivae and EOM are normal.  Neck: Normal range of motion. Neck supple.  Cardiovascular: Normal rate, regular rhythm and normal heart sounds.   Pulmonary/Chest: Effort normal and breath sounds normal. No respiratory distress. She has no wheezes. She has no rales.  Dry cough- improved s/p neb tx  Lymphadenopathy:    She has no cervical adenopathy.  Vitals reviewed.         Assessment & Plan:  Bronchospasm- new.  Suspect this is a post-infectious cough from her recent URI in combination w/ seasonal allergies due to high pollen count.  No evidence of bacterial infxn so no need for abx.  Increase prednisone for short course taper.  Continue albuterol prn.  Reviewed supportive care and red flags that should prompt return.  Pt expressed understanding and is in agreement w/ plan.

## 2017-01-29 NOTE — Progress Notes (Signed)
Pre visit review using our clinic review tool, if applicable. No additional management support is needed unless otherwise documented below in the visit note. 

## 2017-01-29 NOTE — Patient Instructions (Signed)
Follow up as needed or as scheduled Continue to use the albuterol inhaler- 2 puffs every 4-6 hrs as needed Take the Prednisone as directed- 3 tabs at the same time x3 days (take w/ food), then 2 tabs at the same time x3 days, and then 1 tab daily x3 days.  Then resume your 7mg  taper Start Zyrtec daily to decrease post-nasal drip Drink plenty of fluids Call with any questions or concerns Hang in there!!!

## 2017-02-02 ENCOUNTER — Encounter: Payer: Self-pay | Admitting: Family Medicine

## 2017-02-02 DIAGNOSIS — D1779 Benign lipomatous neoplasm of other sites: Secondary | ICD-10-CM | POA: Diagnosis not present

## 2017-02-02 DIAGNOSIS — M545 Low back pain: Secondary | ICD-10-CM | POA: Diagnosis not present

## 2017-02-02 MED ORDER — PROMETHAZINE-DM 6.25-15 MG/5ML PO SYRP: 5.0000 mL | ORAL_SOLUTION | Freq: Four times a day (QID) | ORAL | 0 refills | Status: DC | PRN

## 2017-02-09 DIAGNOSIS — D1779 Benign lipomatous neoplasm of other sites: Secondary | ICD-10-CM | POA: Diagnosis not present

## 2017-02-09 DIAGNOSIS — M545 Low back pain: Secondary | ICD-10-CM | POA: Diagnosis not present

## 2017-02-11 ENCOUNTER — Ambulatory Visit (INDEPENDENT_AMBULATORY_CARE_PROVIDER_SITE_OTHER): Payer: 59 | Admitting: Physician Assistant

## 2017-02-11 ENCOUNTER — Emergency Department (HOSPITAL_COMMUNITY): Payer: 59

## 2017-02-11 ENCOUNTER — Emergency Department (HOSPITAL_COMMUNITY)
Admission: EM | Admit: 2017-02-11 | Discharge: 2017-02-12 | Disposition: A | Discharge status: Home / Self Care | Payer: 59 | Attending: Emergency Medicine | Admitting: Emergency Medicine

## 2017-02-11 ENCOUNTER — Encounter: Payer: Self-pay | Admitting: Physician Assistant

## 2017-02-11 ENCOUNTER — Encounter (HOSPITAL_COMMUNITY): Payer: Self-pay | Admitting: *Deleted

## 2017-02-11 VITALS — BP 120/90 | HR 102 | Temp 98.3°F | Resp 16 | Ht 62.0 in | Wt 273.0 lb

## 2017-02-11 DIAGNOSIS — Z791 Long term (current) use of non-steroidal anti-inflammatories (NSAID): Secondary | ICD-10-CM | POA: Insufficient documentation

## 2017-02-11 DIAGNOSIS — J4521 Mild intermittent asthma with (acute) exacerbation: Secondary | ICD-10-CM | POA: Diagnosis not present

## 2017-02-11 DIAGNOSIS — I10 Essential (primary) hypertension: Secondary | ICD-10-CM | POA: Diagnosis not present

## 2017-02-11 DIAGNOSIS — R079 Chest pain, unspecified: Secondary | ICD-10-CM | POA: Diagnosis not present

## 2017-02-11 DIAGNOSIS — Z720 Tobacco use: Secondary | ICD-10-CM

## 2017-02-11 DIAGNOSIS — D649 Anemia, unspecified: Secondary | ICD-10-CM | POA: Insufficient documentation

## 2017-02-11 DIAGNOSIS — Z79899 Other long term (current) drug therapy: Secondary | ICD-10-CM | POA: Diagnosis not present

## 2017-02-11 DIAGNOSIS — R0789 Other chest pain: Secondary | ICD-10-CM | POA: Diagnosis not present

## 2017-02-11 DIAGNOSIS — F17210 Nicotine dependence, cigarettes, uncomplicated: Secondary | ICD-10-CM | POA: Diagnosis not present

## 2017-02-11 LAB — BASIC METABOLIC PANEL
Anion gap: 14 (ref 5–15)
BUN: 15 mg/dL (ref 6–20)
CHLORIDE: 97 mmol/L — AB (ref 101–111)
CO2: 25 mmol/L (ref 22–32)
Calcium: 9.8 mg/dL (ref 8.9–10.3)
Creatinine, Ser: 0.67 mg/dL (ref 0.44–1.00)
GFR calc non Af Amer: >60 mL/min (ref >60)
Glucose, Bld: 100 mg/dL — ABNORMAL HIGH (ref 65–99)
POTASSIUM: 3.8 mmol/L (ref 3.5–5.1)
Sodium: 136 mmol/L (ref 135–145)

## 2017-02-11 LAB — CBC
HEMATOCRIT: 40.4 % (ref 36.0–46.0)
Hemoglobin: 12.7 g/dL (ref 12.0–15.0)
MCH: 29.9 pg (ref 26.0–34.0)
MCHC: 31.4 g/dL (ref 30.0–36.0)
MCV: 95.1 fL (ref 78.0–100.0)
Platelets: 334 10*3/uL (ref 150–400)
RBC: 4.25 MIL/uL (ref 3.87–5.11)
RDW: 14.3 % (ref 11.5–15.5)
WBC: 16 10*3/uL — AB (ref 4.0–10.5)

## 2017-02-11 LAB — TROPONIN I

## 2017-02-11 LAB — I-STAT TROPONIN, ED: Troponin i, poc: 0 ng/mL (ref 0.00–0.08)

## 2017-02-11 LAB — D-DIMER, QUANTITATIVE (NOT AT ARMC): D DIMER QUANT: 0.97 ug{FEU}/mL — AB (ref 0.00–0.50)

## 2017-02-11 MED ORDER — IPRATROPIUM-ALBUTEROL 0.5-2.5 (3) MG/3ML IN SOLN
3.0000 mL | Freq: Once | RESPIRATORY_TRACT | Status: AC
  Administered 2017-02-11: 3 mL via RESPIRATORY_TRACT
  Filled 2017-02-11: qty 3

## 2017-02-11 MED ORDER — IOPAMIDOL (ISOVUE-370) INJECTION 76%
INTRAVENOUS | Status: AC
  Administered 2017-02-11: 100 mL
  Filled 2017-02-11: qty 100

## 2017-02-11 MED ORDER — MORPHINE SULFATE (PF) 4 MG/ML IV SOLN
4.0000 mg | Freq: Once | INTRAVENOUS | Status: AC
  Administered 2017-02-11: 4 mg via INTRAVENOUS
  Filled 2017-02-11: qty 1

## 2017-02-11 MED ORDER — ONDANSETRON HCL 4 MG/2ML IJ SOLN
4.0000 mg | Freq: Once | INTRAMUSCULAR | Status: AC
  Administered 2017-02-11: 4 mg via INTRAVENOUS
  Filled 2017-02-11: qty 2

## 2017-02-11 MED ORDER — HYDROCODONE-ACETAMINOPHEN 5-325 MG PO TABS: 1.0000 | ORAL_TABLET | ORAL | 0 refills | Status: DC | PRN

## 2017-02-11 MED ORDER — AEROCHAMBER PLUS FLO-VU LARGE MISC
Status: AC
  Filled 2017-02-11: qty 1

## 2017-02-11 MED ORDER — HYDROMORPHONE HCL 1 MG/ML IJ SOLN
1.0000 mg | Freq: Once | INTRAMUSCULAR | Status: AC
  Administered 2017-02-11: 1 mg via INTRAVENOUS
  Filled 2017-02-11: qty 1

## 2017-02-11 MED ORDER — PREDNISONE 10 MG (21) PO TBPK: ORAL_TABLET | ORAL | 0 refills | Status: DC

## 2017-02-11 MED ORDER — METHYLPREDNISOLONE SODIUM SUCC 125 MG IJ SOLR
125.0000 mg | Freq: Once | INTRAMUSCULAR | Status: AC
  Administered 2017-02-11: 125 mg via INTRAVENOUS
  Filled 2017-02-11: qty 2

## 2017-02-11 MED ORDER — AEROCHAMBER PLUS FLO-VU LARGE MISC: 1.0000 | Freq: Once | Status: DC

## 2017-02-11 NOTE — Progress Notes (Signed)
Patient presents to clinic today c/o constant sternal chest pain starting yesterday evening. Has been constant for over 12 hours. Is not worsening but not improving. Is worsened with cough or sneezing, anything that moves her ribs and sternum. Denies trauma or injury. Was seen last visit for post-viral bronchospasm. Was placed on prednisone taper which she endorses taking as directed. States symptoms had been much improved before pain started last night. Denies any shortness of breath. Has denied nausea/vomiting or abdominal pain. Has taken Tramadol with no relief in symptoms. Has history of severe costochondritis with negative ER workups including EKG, troponin, D-dimer and CTA. States current symptoms feel the same as prior episodes but left-shoulder, chest pain is worse than usual.  Past Medical History:  Diagnosis Date  . Anemia   . Arthritis   . Costochondritis   . Diverticulitis   . Dysmenorrhea   . GERD (gastroesophageal reflux disease)   . Headache    Migraines  . History of hiatal hernia   . Hypertension   . Menorrhagia   . Psoriatic arthritis ([place])   . Smoker   . Thyroid disease     Current Outpatient Prescriptions on File Prior to Visit  Medication Sig Dispense Refill  . Cholecalciferol (VITAMIN D PO) Take by mouth.    . diclofenac sodium (VOLTAREN) 1 % GEL Apply 4 g topically 4 (four) times daily. 100 g 1  . esomeprazole (NEXIUM) 40 MG capsule TAKE 1 CAPSULE(40 MG) BY MOUTH DAILY BEFORE BREAKFAST 30 capsule 3  . hydrochlorothiazide (HYDRODIURIL) 25 MG tablet TAKE 1 TABLET BY MOUTH EVERY DAY 30 tablet 3  . ibuprofen (ADVIL,MOTRIN) 800 MG tablet Take 800 mg by mouth every 8 (eight) hours as needed for mild pain or moderate pain.    Marland Kitchen lactobacillus acidophilus (BACID) TABS tablet Take 1 tablet by mouth daily.    Marland Kitchen lisinopril (PRINIVIL,ZESTRIL) 10 MG tablet TAKE 1 TABLET BY MOUTH EVERY DAY 30 tablet 3  . Multiple Vitamin (MULTIVITAMIN WITH MINERALS) TABS tablet Take 1 tablet  by mouth daily.    . Omega-3 Fatty Acids (FISH OIL PO) Take by mouth.    . predniSONE (DELTASONE) 5 MG tablet Take by mouth.    . Pyridoxine HCl (VITAMIN B-6) 500 MG tablet Take 1,000 mg by mouth daily.    . Secukinumab (COSENTYX 300 DOSE) 150 MG/ML SOSY Inject into the skin.    Marland Kitchen XIIDRA 5 % SOLN INT 1 DROP OU BID  12  . albuterol (PROVENTIL HFA;VENTOLIN HFA) 108 (90 Base) MCG/ACT inhaler Inhale 2 puffs into the lungs every 6 (six) hours as needed for wheezing or shortness of breath. 1 Inhaler 2  . clobetasol (TEMOVATE) 0.05 % GEL Apply 1 application topically 2 (two) times daily as needed (for itching/irritation).      No current facility-administered medications on file prior to visit.     Allergies  Allergen Reactions  . Aspirin Other (See Comments)    Reaction:  Convulsions   . Toradol [Ketorolac Tromethamine] Nausea And Vomiting  . Adhesive [Tape] Itching and Rash    Family History  Problem Relation Age of Onset  . Brain cancer Father   . Prostate cancer Father   . Hyperlipidemia Mother   . Colon cancer Maternal Grandmother 45  . Stroke Paternal Grandfather        COD  . Aneurysm Maternal Grandfather     Social History   Social History  . Marital status: Married    Spouse name: N/A  .  Number of children: 0  . Years of education: N/A   Occupational History  . [employer]    Social History Main Topics  . Smoking status: Current Every Day Smoker    Packs/day: 0.10    Years: 20.00    Types: Cigarettes  . Smokeless tobacco: Never Used     Comment: smoking 5 cigarettes a day, hasn't quit completely   . Alcohol use 9.6 oz/week    6 Glasses of wine, 10 Standard drinks or equivalent per week     Comment: socially, weekends 4-5  . Drug use: No  . Sexual activity: Yes    Partners: Male    Birth control/ protection: None   Other Topics Concern  . None   Social History Narrative  . None   Review of Systems - See HPI.  All other ROS are  negative.  BP 120/90   Pulse (!) 102   Temp 98.3 F (36.8 C) (Oral)   Resp 16   Ht 5\' 2"  (1.575 m)   Wt 273 lb (123.8 kg)   LMP 02/28/2016   SpO2 98%   BMI 49.93 kg/m   Physical Exam  Constitutional: She is oriented to person, place, and time.  HENT:  Head: Normocephalic and atraumatic.  Right Ear: Tympanic membrane normal.  Left Ear: Tympanic membrane normal.  Nose: Nose normal.  Eyes: Conjunctivae are normal.  Neck: Neck supple.  Cardiovascular: Normal rate, regular rhythm, normal heart sounds and intact distal pulses.   Pulmonary/Chest: No respiratory distress. She has no wheezes. She has no rales. She exhibits tenderness.  Shallow breathing 2/2 pain.  Abdominal: Normal appearance. There is no tenderness.  Neurological: She is alert and oriented to person, place, and time.  Skin: Skin is warm and dry. No rash noted. She is not diaphoretic.  Vitals reviewed.  Recent Results (from the past 2160 hour(s))  Potassium     Status: None   Collection Time: 11/18/16 12:56 PM  Result Value Ref Range   Potassium 3.9 3.5 - 5.1 mEq/L  Hepatic function panel     Status: Abnormal   Collection Time: 11/18/16 12:56 PM  Result Value Ref Range   Total Bilirubin 0.3 0.2 - 1.2 mg/dL   Bilirubin, Direct 0.1 0.0 - 0.3 mg/dL   Alkaline Phosphatase 66 39 - 117 U/L   AST 31 0 - 37 U/L   ALT 47 (H) 0 - 35 U/L   Total Protein 7.1 6.0 - 8.3 g/dL   Albumin 4.1 3.5 - 5.2 g/dL    Assessment/Plan: 1. Atypical chest pain > 10-12 hours of symptoms. Constant and non-improving. EKG with sinus rhythm and poor R-wave progression. Unchanged from prior tracings. + history of similar symptoms diagnosed as costochondritis after extensive ER workup. Potentially the same as last incident but giving severity and location of pain urgent assessment needed. Vitals stable. Patient transmitted to ER via EMS.  - EKG 12-Lead   Leeanne Rio, PA-C

## 2017-02-11 NOTE — Progress Notes (Signed)
Pre visit review using our clinic review tool, if applicable. No additional management support is needed unless otherwise documented below in the visit note. 

## 2017-02-11 NOTE — Discharge Instructions (Signed)
Try to stop smoking. °

## 2017-02-11 NOTE — ED Notes (Signed)
Patient transported to CT 

## 2017-02-11 NOTE — ED Notes (Signed)
Patient received albuterol sulfate 2.5mg /57ml instead of Ipratropium Bromide & Albuterol sulfate. Dr. Gilford Raid notified of event. Ok that only albuterol was given.

## 2017-02-11 NOTE — ED Triage Notes (Signed)
To ED via [facility] for eval of cp. Sent from PCP. Hx of same. Pt take tramadol for this pain. None since 10am. No fevers.

## 2017-02-11 NOTE — ED Provider Notes (Signed)
[facility] DEPT Provider Note   CSN: [id number] Arrival date & time: 02/11/17  1506     History   Chief Complaint Chief Complaint  Patient presents with  . Chest Pain    HPI Dawn Hayes is a 46 y.o. female.  Pt presents to the ED today with CP.  The pt initially went to pcp who sent her here.  The pt does have a hx of costochondritis and feels like this is costochondritis.  She has pain with palpation and with inspiration.  The pt has taken tramadol for the pain, but it has not helped.        Past Medical History:  Diagnosis Date  . Anemia   . Arthritis   . Costochondritis   . Diverticulitis   . Dysmenorrhea   . GERD (gastroesophageal reflux disease)   . Headache    Migraines  . History of hiatal hernia   . Hypertension   . Menorrhagia   . Psoriatic arthritis ([place])   . Smoker   . Thyroid disease     Patient Active Problem List   Diagnosis Date Noted  . Anxiety and depression 05/27/2016  . Status post total abdominal hysterectomy 04/08/2016  . Costochondritis 12/04/2015  . Maxillary sinusitis, acute 12/04/2015  . Anemia, iron deficiency 10/10/2015  . Tick bites 10/10/2015  . Fatigue 10/10/2015  . Allergic rhinitis 09/07/2015  . Hemorrhoid   . Diverticulosis of colon without hemorrhage   . Hiatal hernia   . Anemia 05/08/2015  . Rectal bleeding 05/08/2015  . Chest pain, rule out acute myocardial infarction 09/27/2013  . Abnormal finding on EKG 09/16/2013  . Psoriasis 09/16/2013  . Psoriatic arthritis ([place]) 09/16/2013  . HTN (hypertension) 09/16/2013  . Morbid obesity, unspecified obesity type ([place]) 09/16/2013  . GERD 12/11/2009  . DIARRHEA, CHRONIC 12/11/2009    Past Surgical History:  Procedure Laterality Date  . CARPAL TUNNEL RELEASE Left 01/19/2015   Procedure: LEFT CARPAL TUNNEL RELEASE;  Surgeon: Roseanne Kaufman, MD;  Location: [facility];  Service: Orthopedics;  Laterality: Left;  . CHOLECYSTECTOMY    . CHOLECYSTECTOMY,  LAPAROSCOPIC  2006  . COLONOSCOPY  1998   normal, [place]  . COLONOSCOPY N/A 05/30/2015   RMR: Minimal anal canal hemorrhoids. colonic diverticulosis. I suspect relatively trivial recent gI Bleed likely related to hemorrhoids. This finding alone would not likely adequatly explain her degree of anemia. she likely has a significent component from menstrual losses. , etc.   . CYSTOSCOPY N/A 04/08/2016   Procedure: Consuela Mimes;  Surgeon: Nunzio Cobbs, MD;  Location: [facility];  Service: Gynecology;  Laterality: N/A;  . DILATATION & CURETTAGE/HYSTEROSCOPY WITH MYOSURE N/A 01/04/2016   Procedure: DILATATION & CURETTAGE/HYSTEROSCOPY;  Surgeon: Nunzio Cobbs, MD;  Location: [facility];  Service: Gynecology;  Laterality: N/A;  . ESOPHAGOGASTRODUODENOSCOPY  09/11/99   tiny esophageal erosions with mild erosive reflux/no barrett's/normal stomach  . ESOPHAGOGASTRODUODENOSCOPY N/A 05/30/2015   RMR: Hiatal hernia otherwise negative EGD  . HYSTERECTOMY ABDOMINAL WITH SALPINGECTOMY Bilateral 04/08/2016   Procedure: HYSTERECTOMY ABDOMINAL WITH SALPINGECTOMY;  Surgeon: Nunzio Cobbs, MD;  Location: [facility];  Service: Gynecology;  Laterality: Bilateral;  . LAPAROSCOPIC BILATERAL SALPINGECTOMY N/A 04/08/2016   Procedure: LAPAROSCOPIC BILATERAL SALPINGECTOMY possible BSO;  Surgeon: Nunzio Cobbs, MD;  Location: [facility];  Service: Gynecology;  Laterality: N/A;  . LAPAROSCOPIC HYSTERECTOMY N/A 04/08/2016   Procedure: ATTEMPTED HYSTERECTOMY TOTAL LAPAROSCOPIC;  Surgeon: Nunzio Cobbs, MD;  Location: [facility];  Service: Gynecology;  Laterality: N/A;  . LAPAROSCOPIC LYSIS OF ADHESIONS  04/08/2016   Procedure: LAPAROSCOPIC LYSIS OF ADHESIONS;  Surgeon: Nunzio Cobbs, MD;  Location: [facility];  Service: Gynecology;;    OB History    Gravida Para Term Preterm AB Living   0             SAB TAB Ectopic Multiple Live Births                   Home Medications     Prior to Admission medications   Medication Sig Start Date End Date Taking? Authorizing Provider  albuterol (PROVENTIL HFA;VENTOLIN HFA) 108 (90 Base) MCG/ACT inhaler Inhale 2 puffs into the lungs every 6 (six) hours as needed for wheezing or shortness of breath. 07/09/16  Yes Midge Minium, MD  diclofenac sodium (VOLTAREN) 1 % GEL Apply 4 g topically 4 (four) times daily. Patient taking differently: Apply 4 g topically 4 (four) times daily as needed (for pain).  12/08/16  Yes Midge Minium, MD  esomeprazole ([place]) 40 MG capsule TAKE 1 CAPSULE(40 MG) BY MOUTH DAILY BEFORE BREAKFAST Patient taking differently: Take 40 mg by mouth once a day 01/28/17  Yes Annitta Needs, NP  hydrochlorothiazide (HYDRODIURIL) 25 MG tablet TAKE 1 TABLET BY MOUTH EVERY DAY Patient taking differently: Take 25 mg by mouth once a day 12/29/16  Yes Midge Minium, MD  ibuprofen (ADVIL,MOTRIN) 800 MG tablet Take 800 mg by mouth every 8 (eight) hours as needed for mild pain or moderate pain.   Yes [provider]  lactobacillus acidophilus (BACID) TABS tablet Take 1 tablet by mouth daily.   Yes [provider]  lisinopril (PRINIVIL,ZESTRIL) 10 MG tablet TAKE 1 TABLET BY MOUTH EVERY DAY Patient taking differently: Take 10 mg by mouth once a day 12/29/16  Yes Midge Minium, MD  Multiple Vitamin (MULTIVITAMIN WITH MINERALS) TABS tablet Take 1 tablet by mouth daily.   Yes [provider]  Omega-3 Fatty Acids (FISH OIL PO) Take 1 capsule by mouth daily.    Yes [provider]  Secukinumab (COSENTYX 300 DOSE) 150 MG/ML SOSY Inject 300 mg into the skin every 28 (twenty-eight) days.    Yes [provider]  traMADol (ULTRAM) 50 MG tablet Take 50 mg by mouth every 6 (six) hours as needed for pain. 02/06/17  Yes [provider]  XIIDRA 5 % SOLN Instill 1 drop into both eyes two times a day 10/10/16  Yes [provider]  HYDROcodone-acetaminophen  (NORCO/VICODIN) 5-325 MG tablet Take 1 tablet by mouth every 4 (four) hours as needed. 02/11/17   Isla Pence, MD  predniSONE (STERAPRED UNI-PAK 21 TAB) 10 MG (21) TBPK tablet Take 6 tabs by mouth daily  for 2 days, then 5 tabs for 2 days, then 4 tabs for 2 days, then 3 tabs for 2 days, 2 tabs for 2 days, then 1 tab by mouth daily for 2 days 02/11/17   Isla Pence, MD    Family History Family History  Problem Relation Age of Onset  . Brain cancer Father   . Prostate cancer Father   . Hyperlipidemia Mother   . Colon cancer Maternal Grandmother 63  . Stroke Paternal Grandfather        COD  . Aneurysm Maternal Grandfather     Social History Social History  Substance Use Topics  . Smoking status: Current Every Day Smoker  Packs/day: 0.10    Years: 20.00    Types: Cigarettes  . Smokeless tobacco: Never Used     Comment: smoking 5 cigarettes a day, hasn't quit completely   . Alcohol use 9.6 oz/week    6 Glasses of wine, 10 Standard drinks or equivalent per week     Comment: socially, weekends 4-5     Allergies   Aspirin; Toradol [ketorolac tromethamine]; and Adhesive [tape]   Review of Systems Review of Systems  Respiratory: Positive for shortness of breath.   Cardiovascular: Positive for chest pain.  All other systems reviewed and are negative.    Physical Exam Updated Vital Signs BP 119/84   Pulse 84   Temp 98.1 F (36.7 C) (Oral)   Resp 12   LMP 02/28/2016   SpO2 (!) 87%   Physical Exam  Constitutional: She is oriented to person, place, and time. She appears well-developed. She appears distressed.  HENT:  Head: Normocephalic and atraumatic.  Right Ear: External ear normal.  Left Ear: External ear normal.  Nose: Nose normal.  Mouth/Throat: Oropharynx is clear and moist.  Eyes: Pupils are equal, round, and reactive to light. Conjunctivae and EOM are normal.  Neck: Normal range of motion. Neck supple.  Cardiovascular: Normal rate, regular rhythm,  normal heart sounds and intact distal pulses.   Pulmonary/Chest: Effort normal. She has wheezes.    Abdominal: Soft. Bowel sounds are normal.  Musculoskeletal: Normal range of motion.  Neurological: She is alert and oriented to person, place, and time.  Skin: Skin is warm.  Psychiatric: She has a normal mood and affect. Her behavior is normal. Judgment and thought content normal.  Nursing note and vitals reviewed.    ED Treatments / Results  Labs (all labs ordered are listed, but only abnormal results are displayed) Labs Reviewed  BASIC METABOLIC PANEL - Abnormal; Notable for the following:       Result Value   Chloride 97 (*)    Glucose, Bld 100 (*)    All other components within normal limits  CBC - Abnormal; Notable for the following:    WBC 16.0 (*)    All other components within normal limits  D-DIMER, QUANTITATIVE (NOT AT [facility]) - Abnormal; Notable for the following:    D-Dimer, Quant 0.97 (*)    All other components within normal limits  TROPONIN I  I-STAT TROPONIN, ED    EKG  EKG Interpretation  Date/Time:  Wednesday February 11 2017 15:20:46 EDT Ventricular Rate:  90 PR Interval:  156 QRS Duration: 80 QT Interval:  370 QTC Calculation: 452 R Axis:   -12 Text Interpretation:  Normal sinus rhythm Nonspecific T wave abnormality Abnormal ECG Confirmed by Isla Pence [phone]) on 02/11/2017 6:31:57 PM       Radiology Dg Chest 2 View  Result Date: 02/11/2017 CLINICAL DATA:  Chest pain EXAM: CHEST  2 VIEW COMPARISON:  04/16/2016 FINDINGS: Normal heart size. Lungs clear. No pneumothorax. No pleural effusion. IMPRESSION: No active cardiopulmonary disease. Electronically Signed   By: Marybelle Killings M.D.   On: 02/11/2017 16:14   Ct Angio Chest Pe W And/or Wo Contrast  Result Date: 02/11/2017 CLINICAL DATA:  Chest pain. EXAM: CT ANGIOGRAPHY CHEST WITH CONTRAST TECHNIQUE: Multidetector CT imaging of the chest was performed using the standard protocol during bolus  administration of intravenous contrast. Multiplanar CT image reconstructions and MIPs were obtained to evaluate the vascular anatomy. CONTRAST:  100 mL Isovue 370 IV COMPARISON:  04/16/2016 FINDINGS: Cardiovascular: Borderline cardiomegaly. Thoracic  aorta is within normal. No evidence of pulmonary embolism. Mediastinum/Nodes: No mediastinal or hilar adenopathy. Remaining mediastinal structures are within normal. Lungs/Pleura: Lungs are adequately inflated without focal airspace consolidation or effusion. Subtle atelectasis over the left base. Airways are normal. Upper Abdomen: Previous cholecystectomy. Musculoskeletal: Minimal degenerative change of the spine. Review of the MIP images confirms the above findings. IMPRESSION: No evidence of pulmonary embolism. No acute cardiopulmonary disease. Borderline cardiomegaly. Electronically Signed   By: Marin Olp M.D.   On: 02/11/2017 23:17    Procedures Procedures (including critical care time)  Medications Ordered in ED Medications  AEROCHAMBER PLUS FLO-VU LARGE MISC 1 each (not administered)  methylPREDNISolone sodium succinate (SOLU-MEDROL) 125 mg/2 mL injection 125 mg (125 mg Intravenous Given 02/11/17 1850)  ipratropium-albuterol (DUONEB) 0.5-2.5 (3) MG/3ML nebulizer solution 3 mL (3 mLs Nebulization Given 02/11/17 1900)  morphine 4 MG/ML injection 4 mg (4 mg Intravenous Given 02/11/17 1851)  ondansetron (ZOFRAN) injection 4 mg (4 mg Intravenous Given 02/11/17 1851)  HYDROmorphone (DILAUDID) injection 1 mg (1 mg Intravenous Given 02/11/17 2012)  iopamidol (ISOVUE-370) 76 % injection (100 mLs  Contrast Given 02/11/17 2246)     Initial Impression / Assessment and Plan / ED Course  I have reviewed the triage vital signs and the nursing notes.  Pertinent labs & imaging results that were available during my care of the patient were reviewed by me and considered in my medical decision making (see chart for details).    Pt is feeling much better.  She is  encouraged to stop smoking.  She knows to return if worse.  Final Clinical Impressions(s) / ED Diagnoses   Final diagnoses:  Chest wall pain  Tobacco abuse  Mild intermittent reactive airway disease with acute exacerbation    New Prescriptions New Prescriptions   HYDROCODONE-ACETAMINOPHEN (NORCO/VICODIN) 5-325 MG TABLET    Take 1 tablet by mouth every 4 (four) hours as needed.   PREDNISONE (STERAPRED UNI-PAK 21 TAB) 10 MG (21) TBPK TABLET    Take 6 tabs by mouth daily  for 2 days, then 5 tabs for 2 days, then 4 tabs for 2 days, then 3 tabs for 2 days, 2 tabs for 2 days, then 1 tab by mouth daily for 2 days     Isla Pence, MD 02/11/17 2350

## 2017-02-18 ENCOUNTER — Encounter: Payer: Self-pay | Admitting: Physician Assistant

## 2017-02-18 ENCOUNTER — Ambulatory Visit (INDEPENDENT_AMBULATORY_CARE_PROVIDER_SITE_OTHER): Payer: 59 | Admitting: Physician Assistant

## 2017-02-18 VITALS — BP 112/84 | HR 94 | Temp 98.4°F | Resp 14 | Ht 62.0 in | Wt 273.0 lb

## 2017-02-18 DIAGNOSIS — J4541 Moderate persistent asthma with (acute) exacerbation: Secondary | ICD-10-CM

## 2017-02-18 DIAGNOSIS — M940 Chondrocostal junction syndrome [Tietze]: Secondary | ICD-10-CM

## 2017-02-18 MED ORDER — IPRATROPIUM-ALBUTEROL 0.5-2.5 (3) MG/3ML IN SOLN
3.0000 mL | Freq: Once | RESPIRATORY_TRACT | Status: AC
  Administered 2017-02-18: 3 mL via RESPIRATORY_TRACT

## 2017-02-18 MED ORDER — BECLOMETHASONE DIPROP HFA 40 MCG/ACT IN AERB: 2.0000 | INHALATION_SPRAY | Freq: Two times a day (BID) | RESPIRATORY_TRACT | 0 refills | Status: DC

## 2017-02-18 NOTE — Progress Notes (Signed)
Pre visit review using our clinic review tool, if applicable. No additional management support is needed unless otherwise documented below in the visit note. 

## 2017-02-18 NOTE — Patient Instructions (Signed)
I am glad you are feeling better.  Please complete the steroid taper. Stay well-hydrated and get plenty of rest. Use the Qvar inhaler as directed until these symptoms are resolving. Then resume normal regimen. Can still use albuterol as directed when needed.

## 2017-02-18 NOTE — Addendum Note (Signed)
Addended by: Leonidas Romberg on: 02/18/2017 04:04 PM   Modules accepted: Orders

## 2017-02-18 NOTE — Progress Notes (Signed)
Patient presents to clinic today for ER follow-up. Patient was seen in the office on 02/11/2017 for severe sternal chest pain. EKG without acute findings. Patient was send to ER for further assessment to rule out ACS/PE. Workup included CXr, labs and CTA that were negative. Patient was given prednisone taper, Rx Norco and discharged home. Since discharge, patient endorses doing much better. Pain has almost resolved. Is aggravated by coughing. Has had some slight residual wheeze despite steroids and inhaler. Denies fever, chills. Denies new or worsening symptoms.  Past Medical History:  Diagnosis Date  . Anemia   . Arthritis   . Costochondritis   . Diverticulitis   . Dysmenorrhea   . GERD (gastroesophageal reflux disease)   . Headache    Migraines  . History of hiatal hernia   . Hypertension   . Menorrhagia   . Psoriatic arthritis ([place])   . Smoker   . Thyroid disease     Current Outpatient Prescriptions on File Prior to Visit  Medication Sig Dispense Refill  . albuterol (PROVENTIL HFA;VENTOLIN HFA) 108 (90 Base) MCG/ACT inhaler Inhale 2 puffs into the lungs every 6 (six) hours as needed for wheezing or shortness of breath. 1 Inhaler 2  . diclofenac sodium (VOLTAREN) 1 % GEL Apply 4 g topically 4 (four) times daily. (Patient taking differently: Apply 4 g topically 4 (four) times daily as needed (for pain). ) 100 g 1  . esomeprazole (NEXIUM) 40 MG capsule TAKE 1 CAPSULE(40 MG) BY MOUTH DAILY BEFORE BREAKFAST (Patient taking differently: Take 40 mg by mouth once a day) 30 capsule 3  . hydrochlorothiazide (HYDRODIURIL) 25 MG tablet TAKE 1 TABLET BY MOUTH EVERY DAY (Patient taking differently: Take 25 mg by mouth once a day) 30 tablet 3  . HYDROcodone-acetaminophen (NORCO/VICODIN) 5-325 MG tablet Take 1 tablet by mouth every 4 (four) hours as needed. 10 tablet 0  . ibuprofen (ADVIL,MOTRIN) 800 MG tablet Take 800 mg by mouth every 8 (eight) hours as needed for mild pain or moderate pain.      Marland Kitchen lactobacillus acidophilus (BACID) TABS tablet Take 1 tablet by mouth daily.    Marland Kitchen lisinopril (PRINIVIL,ZESTRIL) 10 MG tablet TAKE 1 TABLET BY MOUTH EVERY DAY (Patient taking differently: Take 10 mg by mouth once a day) 30 tablet 3  . Multiple Vitamin (MULTIVITAMIN WITH MINERALS) TABS tablet Take 1 tablet by mouth daily.    . Omega-3 Fatty Acids (FISH OIL PO) Take 1 capsule by mouth daily.     . predniSONE (STERAPRED UNI-PAK 21 TAB) 10 MG (21) TBPK tablet Take 6 tabs by mouth daily  for 2 days, then 5 tabs for 2 days, then 4 tabs for 2 days, then 3 tabs for 2 days, 2 tabs for 2 days, then 1 tab by mouth daily for 2 days 42 tablet 0  . Secukinumab (COSENTYX 300 DOSE) 150 MG/ML SOSY Inject 300 mg into the skin every 28 (twenty-eight) days.     . traMADol (ULTRAM) 50 MG tablet Take 50 mg by mouth every 6 (six) hours as needed for pain.  0  . XIIDRA 5 % SOLN Instill 1 drop into both eyes two times a day  12   No current facility-administered medications on file prior to visit.     Allergies  Allergen Reactions  . Aspirin Other (See Comments)    Convulsions   . Toradol [Ketorolac Tromethamine] Nausea And Vomiting  . Adhesive [Tape] Itching and Rash    Family History  Problem Relation  Age of Onset  . Brain cancer Father   . Prostate cancer Father   . Hyperlipidemia Mother   . Colon cancer Maternal Grandmother 49  . Stroke Paternal Grandfather        COD  . Aneurysm Maternal Grandfather     Social History   Social History  . Marital status: Married    Spouse name: N/A  . Number of children: 0  . Years of education: N/A   Occupational History  . [employer]    Social History Main Topics  . Smoking status: Current Every Day Smoker    Packs/day: 0.10    Years: 20.00    Types: Cigarettes  . Smokeless tobacco: Never Used     Comment: smoking 5 cigarettes a day, hasn't quit completely   . Alcohol use 9.6 oz/week    6 Glasses of wine, 10 Standard drinks or  equivalent per week     Comment: socially, weekends 4-5  . Drug use: No  . Sexual activity: Yes    Partners: Male    Birth control/ protection: None   Other Topics Concern  . None   Social History Narrative  . None   Review of Systems - See HPI.  All other ROS are negative.  BP 112/84   Pulse 94   Temp 98.4 F (36.9 C) (Oral)   Resp 14   Ht 5' 2" (1.575 m)   Wt 273 lb (123.8 kg)   LMP 02/28/2016   SpO2 98%   BMI 49.93 kg/m   Physical Exam  Constitutional: She is oriented to person, place, and time and well-developed, well-nourished, and in no distress.  HENT:  Head: Normocephalic and atraumatic.  Right Ear: External ear normal.  Left Ear: External ear normal.  Mouth/Throat: Oropharynx is clear and moist.  Eyes: Conjunctivae are normal.  Neck: Neck supple.  Cardiovascular: Normal rate, regular rhythm, normal heart sounds and intact distal pulses.   Pulmonary/Chest: Effort normal. No respiratory distress. She has wheezes. She has no rales. She exhibits no tenderness.  Lymphadenopathy:    She has no cervical adenopathy.  Neurological: She is alert and oriented to person, place, and time.  Skin: Skin is warm and dry. No rash noted.  Psychiatric: Affect normal.  Vitals reviewed.   Recent Results (from the past 2160 hour(s))  Basic metabolic panel     Status: Abnormal   Collection Time: 02/11/17  3:22 PM  Result Value Ref Range   Sodium 136 135 - 145 mmol/L   Potassium 3.8 3.5 - 5.1 mmol/L   Chloride 97 (L) 101 - 111 mmol/L   CO2 25 22 - 32 mmol/L   Glucose, Bld 100 (H) 65 - 99 mg/dL   BUN 15 6 - 20 mg/dL   Creatinine, Ser 0.67 0.44 - 1.00 mg/dL   Calcium 9.8 8.9 - 10.3 mg/dL   GFR calc non Af Amer >60 >60 mL/min   GFR calc Af Amer >60 >60 mL/min    Comment: (NOTE) The eGFR has been calculated using the CKD EPI equation. This calculation has not been validated in all clinical situations. eGFR's persistently <60 mL/min signify possible Chronic  Kidney Disease.    Anion gap 14 5 - 15  CBC     Status: Abnormal   Collection Time: 02/11/17  3:22 PM  Result Value Ref Range   WBC 16.0 (H) 4.0 - 10.5 K/uL   RBC 4.25 3.87 - 5.11 MIL/uL   Hemoglobin 12.7 12.0 - 15.0  g/dL   HCT 40.4 36.0 - 46.0 %   MCV 95.1 78.0 - 100.0 fL   MCH 29.9 26.0 - 34.0 pg   MCHC 31.4 30.0 - 36.0 g/dL   RDW 14.3 11.5 - 15.5 %   Platelets 334 150 - 400 K/uL  D-dimer, quantitative (not at [facility])     Status: Abnormal   Collection Time: 02/11/17  3:22 PM  Result Value Ref Range   D-Dimer, Quant 0.97 (H) 0.00 - 0.50 ug/mL-FEU    Comment: (NOTE) At the manufacturer cut-off of 0.50 ug/mL FEU, this assay has been documented to exclude PE with a sensitivity and negative predictive value of 97 to 99%.  At this time, this assay has not been approved by the FDA to exclude DVT/VTE. Results should be correlated with clinical presentation.   I-stat troponin, ED     Status: None   Collection Time: 02/11/17  4:01 PM  Result Value Ref Range   Troponin i, poc 0.00 0.00 - 0.08 ng/mL   Comment 3            Comment: Due to the release kinetics of cTnI, a negative result within the first hours of the onset of symptoms does not rule out myocardial infarction with certainty. If myocardial infarction is still suspected, repeat the test at appropriate intervals.   Troponin I     Status: None   Collection Time: 02/11/17 10:04 PM  Result Value Ref Range   Troponin I <0.03 <0.03 ng/mL    Assessment/Plan: 1. Costochondritis Resolving. Complete course of steroids. Norco PRN for severe pain only.   2. Moderate persistent reactive airway disease with acute exacerbation Duoneb given in office with improvement. CXR in ER negative. Finish steroid taper. Will add on Qvar at low dose. Follow-up if not resolving.    Leeanne Rio, PA-C

## 2017-02-19 ENCOUNTER — Telehealth: Payer: Self-pay | Admitting: Family Medicine

## 2017-02-19 NOTE — Telephone Encounter (Signed)
Pt states that [facility] does not have Qvar inhaler and asking if something else could be called in. [facility] in [place].

## 2017-02-20 MED ORDER — BECLOMETHASONE DIPROP HFA 40 MCG/ACT IN AERB: 2.0000 | INHALATION_SPRAY | Freq: Two times a day (BID) | RESPIRATORY_TRACT | 0 refills | Status: DC

## 2017-02-20 NOTE — Telephone Encounter (Signed)
Pharmacy called -- they have Qvar but only in the REdihaler dosing. New Rx was sent in for patient.

## 2017-02-23 ENCOUNTER — Telehealth: Payer: Self-pay | Admitting: General Practice

## 2017-02-23 ENCOUNTER — Telehealth: Payer: Self-pay | Admitting: Family Medicine

## 2017-02-23 NOTE — Telephone Encounter (Signed)
[facility] Patient Name: Dawn Hayes Gender: Female DOB: [birth year] Age: 46 Y 84 M 10 D Return Phone Number: [phone] (Primary), [phone] (Secondary) Address: [place] [address] Client [place] Client Site [facility] - Day Physician Dimple Nanas - MD Contact Type Call Who Is Calling Patient / Member / Family / Caregiver Call Type Triage / Clinical Relationship To Patient Self Return Phone Number [phone] (Primary) Chief Complaint Blood In Stool Reason for Call Symptomatic / Request for [place] states she has blood in stool since Saturday Appointment Disposition EMR Appointment Not Necessary Info pasted into Epic Yes Translation No Nurse Assessment Nurse: Neena Rhymes, RN, Sharyn Lull Date/Time (Eastern Time): 02/23/2017 9:04:09 AM Confirm and document reason for call. If symptomatic, describe symptoms. ---Caller states she has blood in stool since Saturday. % times totally. She states she feels bllod passing as liquid when she has a BM. States she has some abdominal discomfort. She states she is on prednisone and pain medication for costrochondritis diagnosed 1 week ago. Does the patient have any new or worsening symptoms? ---Yes Will a triage be completed? ---Yes Related visit to physician within the last 2 weeks? ---No Does the PT have any chronic conditions? (i.e. diabetes, asthma, etc.) ---Yes List chronic conditions. ---Psoriatic arthritis, costochondritis Is the patient pregnant or possibly pregnant? (Ask all females between the ages of 67-55) ---No Is this a behavioral health or substance abuse call? ---No Guidelines Guideline Title Affirmed Question Affirmed Notes Nurse Date/Time (Eastern Time) Rectal Bleeding [1] MODERATE rectal bleeding  (small blood clots, passing blood without stool, or toilet water turns red) AND [2] more than once a day Neena Rhymes, RNSharyn Lull 02/23/2017 9:09:34 AM PLEASE NOTE: All timestamps contained within this report are represented as [place] Standard Time. CONFIDENTIALTY NOTICE: This fax transmission is intended only for the addressee. It contains information that is legally privileged, confidential or otherwise protected from use or disclosure. If you are not the intended recipient, you are strictly prohibited from reviewing, disclosing, copying using or disseminating any of this information or taking any action in reliance on or regarding this information. If you have received this fax in error, please notify us immediately by telephone so that we can arrange for its return to [place]. Phone: [phone], Toll-Free: [phone], Fax: [phone] Page: 2 of 2 Call Id: [id number] [place]. Time Eilene Ghazi Time) Disposition Final User 02/23/2017 9:13:44 AM Send To RN Personal Neena Rhymes, RN, Sharyn Lull 02/23/2017 9:15:19 AM Send To RN Personal Neena Rhymes, RN, [facility] 02/23/2017 9:35:40 AM Call Completed Ronnald Ramp, RN, Miranda 02/23/2017 9:14:12 AM Go to ED Now Yes Neena Rhymes, RN, Julio Sicks Disagree/Comply Comply Caller Understands Yes PreDisposition Go to ED Care Advice Given Per Guideline GO TO ED NOW: You need to be seen in the Emergency Department. Go to the ER at ___________ [place] now. Drive carefully. DRIVING: Another adult should drive. Do not delay going to the Emergency Department. If immediate transportation is not available via car or taxi, then the patient should be instructed to call [id number]. * Please bring a list of your current medicines when you go to the Emergency Department (ER). * It is also a good idea to bring the pill bottles too. This will help the doctor to make certain you are taking the right medicines and the right dose. CARE ADVICE given per Rectal Bleeding (Adult)  guideline. Referrals GO TO FACILITY  UNDECIDED

## 2017-02-23 NOTE — Telephone Encounter (Signed)
If pt does not go to ER for evaluation, will need to be seen here

## 2017-02-23 NOTE — Telephone Encounter (Signed)
Spoke with patient regarding symptoms. Patient reports rectal bleeding x 3 days with abdominal pain, denies fatigue or lightheadedness. Patient states she does not feel that this is an emergent situation. Strongly encouraged patient to go to Emergency Department for evaluation. Patient states she will go after work.

## 2017-02-23 NOTE — Telephone Encounter (Signed)
Patient Name: KIRIN BRANDENBURGER DOB: 02/05/1971 Initial Comment Caller states she has blood in stool since Saturday Nurse Assessment Nurse: Neena Rhymes, RN, Sharyn Lull Date/Time Eilene Ghazi Time): 02/23/2017 9:04:09 AM Confirm and document reason for call. If symptomatic, describe symptoms. ---Caller states she has blood in stool since Saturday. % times totally. She states she feels bllod passing as liquid when she has a BM. States she has some abdominal discomfort. She states she is on prednisone and pain medication for costrochondritis diagnosed 1 week ago. Does the patient have any new or worsening symptoms? ---Yes Will a triage be completed? ---Yes Related visit to physician within the last 2 weeks? ---No Does the PT have any chronic conditions? (i.e. diabetes, asthma, etc.) ---Yes List chronic conditions. ---Psoriatic arthritis, costochondritis Is the patient pregnant or possibly pregnant? (Ask all females between the ages of 4-55) ---No Is this a behavioral health or substance abuse call? ---No Guidelines Guideline Title Affirmed Question Affirmed Notes Rectal Bleeding [1] MODERATE rectal bleeding (small blood clots, passing blood without stool, or toilet water turns red) AND [2] more than once a day Final Disposition User Go to ED Now Neena Rhymes, RN, Sharyn Lull Referrals GO TO FACILITY UNDECIDED Caller Disagree/Comply Comply Caller Understands Yes PreDisposition Go to ED

## 2017-02-25 DIAGNOSIS — K5792 Diverticulitis of intestine, part unspecified, without perforation or abscess without bleeding: Secondary | ICD-10-CM | POA: Diagnosis not present

## 2017-02-25 DIAGNOSIS — K921 Melena: Secondary | ICD-10-CM | POA: Diagnosis not present

## 2017-02-25 DIAGNOSIS — K5732 Diverticulitis of large intestine without perforation or abscess without bleeding: Secondary | ICD-10-CM | POA: Diagnosis not present

## 2017-02-25 DIAGNOSIS — K625 Hemorrhage of anus and rectum: Secondary | ICD-10-CM | POA: Diagnosis not present

## 2017-02-25 DIAGNOSIS — K219 Gastro-esophageal reflux disease without esophagitis: Secondary | ICD-10-CM | POA: Diagnosis not present

## 2017-02-26 DIAGNOSIS — Z23 Encounter for immunization: Secondary | ICD-10-CM | POA: Diagnosis not present

## 2017-03-03 ENCOUNTER — Ambulatory Visit (INDEPENDENT_AMBULATORY_CARE_PROVIDER_SITE_OTHER): Payer: 59 | Admitting: Internal Medicine

## 2017-03-03 ENCOUNTER — Encounter: Payer: Self-pay | Admitting: Internal Medicine

## 2017-03-03 VITALS — BP 142/86 | HR 103 | Temp 97.5°F | Ht 61.5 in | Wt 275.4 lb

## 2017-03-03 DIAGNOSIS — K625 Hemorrhage of anus and rectum: Secondary | ICD-10-CM | POA: Diagnosis not present

## 2017-03-03 DIAGNOSIS — K921 Melena: Secondary | ICD-10-CM | POA: Diagnosis not present

## 2017-03-03 DIAGNOSIS — K219 Gastro-esophageal reflux disease without esophagitis: Secondary | ICD-10-CM

## 2017-03-03 MED ORDER — HYDROCORTISONE 2.5 % RE CREA: 1.0000 "application " | TOPICAL_CREAM | Freq: Three times a day (TID) | RECTAL | 0 refills | Status: DC

## 2017-03-03 NOTE — Progress Notes (Signed)
Primary Care Physician:  Midge Minium, MD Primary Gastroenterologist:  Dr. Gala Romney  Pre-Procedure History & Physical: HPI:  Dawn Hayes is a 46 y.o. female here for for evaluation of rectal bleeding-painless. For little over a week, patient has seen blood per rectum with bowel movements. Typically has 2 bowel movements daily; has seen some occasional red blood with each bowel movement. Absolutely has not had any abdominal pain, whatsoever. Seen in the ED recently for costochondritis and bleeding. Diagnosed with "diverticulitis" given bleeding by EDP. Again, she's never had any abdominal pain. No abdominal /pelvic CT recently. Finishing up a course of Cipro and Flagyl. Denies constipation or diarrhea.  GI evaluation here last year demonstrated scattered left-sided diverticula and minimal internal hemorrhoids only on colonoscopy. EGD demonstrating only a Hiatal Hernia. That was done for rectal bleeding and IDA. She underwent a hysterectomy last year for IDA. Most recent hemoglobin in Epic-12.3 on October 3.  She has continued to work .  she is ambulatory continues to drive. Hasn't had any melena, nausea or  vomiting.  GERD symptoms well controlled on esomeprazole 40 mg daily. Has significant trouble with recurrent costochondritis. She has been on nonsteroidals in the past along with prednisone and recently started to Cosyntex  Per her rheumatologist.  History of psoriatic arthritis.   Past Medical History:  Diagnosis Date  . Anemia   . Arthritis   . Costochondritis   . Diverticulitis   . Dysmenorrhea   . GERD (gastroesophageal reflux disease)   . Headache    Migraines  . History of hiatal hernia   . Hypertension   . Menorrhagia   . Psoriatic arthritis ([place])   . Smoker   . Thyroid disease     Past Surgical History:  Procedure Laterality Date  . CARPAL TUNNEL RELEASE Left 01/19/2015   Procedure: LEFT CARPAL TUNNEL RELEASE;  Surgeon: Roseanne Kaufman, MD;  Location: [facility];  Service: Orthopedics;  Laterality: Left;  . CHOLECYSTECTOMY    . CHOLECYSTECTOMY, LAPAROSCOPIC  2006  . COLONOSCOPY  1998   normal, [place]  . COLONOSCOPY N/A 05/30/2015   RMR: Minimal anal canal hemorrhoids. colonic diverticulosis. I suspect relatively trivial recent gI Bleed likely related to hemorrhoids. This finding alone would not likely adequatly explain her degree of anemia. she likely has a significent component from menstrual losses. , etc.   . CYSTOSCOPY N/A 04/08/2016   Procedure: Consuela Mimes;  Surgeon: Nunzio Cobbs, MD;  Location: [facility];  Service: Gynecology;  Laterality: N/A;  . DILATATION & CURETTAGE/HYSTEROSCOPY WITH MYOSURE N/A 01/04/2016   Procedure: DILATATION & CURETTAGE/HYSTEROSCOPY;  Surgeon: Nunzio Cobbs, MD;  Location: [facility];  Service: Gynecology;  Laterality: N/A;  . ESOPHAGOGASTRODUODENOSCOPY  09/11/99   tiny esophageal erosions with mild erosive reflux/no barrett's/normal stomach  . ESOPHAGOGASTRODUODENOSCOPY N/A 05/30/2015   RMR: Hiatal hernia otherwise negative EGD  . HYSTERECTOMY ABDOMINAL WITH SALPINGECTOMY Bilateral 04/08/2016   Procedure: HYSTERECTOMY ABDOMINAL WITH SALPINGECTOMY;  Surgeon: Nunzio Cobbs, MD;  Location: [facility];  Service: Gynecology;  Laterality: Bilateral;  . LAPAROSCOPIC BILATERAL SALPINGECTOMY N/A 04/08/2016   Procedure: LAPAROSCOPIC BILATERAL SALPINGECTOMY possible BSO;  Surgeon: Nunzio Cobbs, MD;  Location: [facility];  Service: Gynecology;  Laterality: N/A;  . LAPAROSCOPIC HYSTERECTOMY N/A 04/08/2016   Procedure: ATTEMPTED HYSTERECTOMY TOTAL LAPAROSCOPIC;  Surgeon: Nunzio Cobbs, MD;  Location: [facility];  Service: Gynecology;  Laterality: N/A;  . LAPAROSCOPIC LYSIS OF ADHESIONS  04/08/2016   Procedure: LAPAROSCOPIC LYSIS OF ADHESIONS;  Surgeon: Nunzio Cobbs, MD;  Location: [facility];  Service: Gynecology;;    Prior to Admission medications   Medication Sig  Start Date End Date Taking? Authorizing Provider  albuterol (PROVENTIL HFA;VENTOLIN HFA) 108 (90 Base) MCG/ACT inhaler Inhale 2 puffs into the lungs every 6 (six) hours as needed for wheezing or shortness of breath. 07/09/16  Yes Midge Minium, MD  ciprofloxacin (CIPRO) 500 MG tablet Take 1 tablet by mouth 2 (two) times daily. 02/25/17  Yes [provider]  diclofenac sodium (VOLTAREN) 1 % GEL Apply 4 g topically 4 (four) times daily. Patient taking differently: Apply 4 g topically 4 (four) times daily as needed (for pain).  12/08/16  Yes Midge Minium, MD  esomeprazole ([place]) 40 MG capsule TAKE 1 CAPSULE(40 MG) BY MOUTH DAILY BEFORE BREAKFAST Patient taking differently: Take 40 mg by mouth once a day 01/28/17  Yes Annitta Needs, NP  hydrochlorothiazide (HYDRODIURIL) 25 MG tablet TAKE 1 TABLET BY MOUTH EVERY DAY Patient taking differently: Take 25 mg by mouth once a day 12/29/16  Yes Midge Minium, MD  HYDROcodone-acetaminophen (NORCO/VICODIN) 5-325 MG tablet Take 1 tablet by mouth every 4 (four) hours as needed. 02/11/17  Yes Isla Pence, MD  ibuprofen (ADVIL,MOTRIN) 800 MG tablet Take 800 mg by mouth every 8 (eight) hours as needed for mild pain or moderate pain.   Yes [provider]  lactobacillus acidophilus (BACID) TABS tablet Take 1 tablet by mouth daily.   Yes [provider]  lisinopril (PRINIVIL,ZESTRIL) 10 MG tablet TAKE 1 TABLET BY MOUTH EVERY DAY Patient taking differently: Take 10 mg by mouth once a day 12/29/16  Yes Midge Minium, MD  lisinopril (PRINIVIL,ZESTRIL) 10 MG tablet daily. 01/29/17  Yes [provider]  metroNIDAZOLE (FLAGYL) 500 MG tablet Take 1 tablet by mouth daily. 02/25/17 03/04/17 Yes [provider]  Multiple Vitamin (MULTIVITAMIN WITH MINERALS) TABS tablet Take 1 tablet by mouth daily.   Yes [provider]  predniSONE (DELTASONE) 5 MG tablet Take 7 mg by mouth daily.   Yes [provider]  Secukinumab (COSENTYX 300 DOSE) 150 MG/ML SOSY Inject 300 mg into the skin every 28 (twenty-eight) days.    Yes [provider]  traMADol (ULTRAM) 50 MG tablet Take 50 mg by mouth every 6 (six) hours as needed for pain. 02/06/17  Yes [provider]  XIIDRA 5 % SOLN Instill 1 drop into both eyes two times a day 10/10/16  Yes [provider]  hydrocortisone (ANUSOL-HC) 2.5 % rectal cream Place 1 application rectally 3 (three) times daily. X 5 days 03/03/17   Daneil Dolin, MD    Allergies as of 03/03/2017 - Review Complete 03/03/2017  Allergen Reaction Noted  . Aspirin Other (See Comments) 12/06/2009  . Toradol [ketorolac tromethamine] Nausea And Vomiting 04/07/2016  . Adhesive [tape] Itching and Rash 09/26/2013    Family History  Problem Relation Age of Onset  . Brain cancer Father   . Prostate cancer Father   . Hyperlipidemia Mother   . Colon cancer Maternal Grandmother 23  . Stroke Paternal Grandfather        COD  . Aneurysm Maternal Grandfather     Social History   Social History  . Marital status: Married    Spouse name: N/A  . Number of children: 0  . Years of education: N/A   Occupational History  . [employer]    Social History Main Topics  . Smoking status: Current Every Day Smoker    Packs/day: 0.10    Years: 20.00    Types: Cigarettes  . Smokeless tobacco: Never Used     Comment: smoking 5 cigarettes a day, hasn't quit completely   . Alcohol use 9.6 oz/week    6 Glasses of wine, 10 Standard drinks or equivalent per week     Comment: socially, weekends 4-5  . Drug use: No  . Sexual activity: Yes    Partners: Male    Birth control/ protection: None   Other Topics Concern  . Not on file   Social History Narrative  . No narrative on file    Review of Systems: See HPI, otherwise negative ROS  Physical Exam: BP (!) 142/86   Pulse (!) 103   Temp (!) 97.5 F (36.4 C) (Oral)   Ht 5' 1.5" (1.562  m)   Wt 275 lb 6.4 oz (124.9 kg)   LMP 02/28/2016   BMI 51.19 kg/m  General:   Alert,  obese pleasant and cooperative in NAD Neck:  Supple; no masses or thyromegaly. No significant cervical adenopathy. Lungs:  Clear throughout to auscultation.   No wheezes, crackles, or rhonchi. No acute distress. Heart:  Regular rate and rhythm; no murmurs, clicks, rubs,  or gallops. Abdomen: massively obese. Positive bowel sounds. Entirely soft and nontender without obvious mass or organomegaly Pulses:  Normal pulses noted. Extremities:  Without clubbing or edema. Rectal: No external anal lesions.Digital exam reveals no mass or stool in rectal vault. Mucus is Hemoccult negative.   Impression:  Pleasant 45 year old morbidly obese lady with greater than 1 week history of painless low-volume hematochezia. Bleeding occurs with bowel movements almost exclusively. Denies nonsteroidals in the immediate past but has taken them previously. Known internal hemorrhoids. Colonoscopy findings recently as outlined. She does not at all look appear acutely ill, toxic or hemodynamically unstable at this time. Bleeding likely arising from an ano-rectal source. She's had absolutely no abdominal pain or recent cross-sectional imaging. I doubt the diagnosis of diverticulitis although diverticular bleeding is not absolutely excluded-also less likely.   Recommendations:   Complete blood count  Prescription for Anusol cream - dispense - one unit  - apply to the anus rectum 3x daily x 5 days  Further recommendations to follow.  Further evaluation maybe needed including a sigmoidoscopy.  In the unlikely event bleeding significantly worsens you should go to the ED      Notice: This dictation was prepared with Dragon dictation along with smaller phrase technology. Any transcriptional errors that result from this process are unintentional and may not be corrected upon review.

## 2017-03-03 NOTE — Patient Instructions (Signed)
Complete blood count  Prescription for Anusol cream - dispense - one unit  - apply to the anus rectum 3x daily x 5 days  Further recommendations to follow.  Further evaluation maybe needed including a sigmoidoscopy.  In the unlikely event bleeding significantly worsens you should go to the ED

## 2017-03-04 ENCOUNTER — Other Ambulatory Visit (INDEPENDENT_AMBULATORY_CARE_PROVIDER_SITE_OTHER): Payer: 59

## 2017-03-04 DIAGNOSIS — K625 Hemorrhage of anus and rectum: Secondary | ICD-10-CM | POA: Diagnosis not present

## 2017-03-04 LAB — CBC WITH DIFFERENTIAL/PLATELET
BASOS ABS: 0.1 10*3/uL (ref 0.0–0.1)
BASOS PCT: 0.7 % (ref 0.0–3.0)
Eosinophils Absolute: 0.2 10*3/uL (ref 0.0–0.7)
Eosinophils Relative: 1.4 % (ref 0.0–5.0)
HCT: 34.7 % — ABNORMAL LOW (ref 36.0–46.0)
Hemoglobin: 11.3 g/dL — ABNORMAL LOW (ref 12.0–15.0)
LYMPHS ABS: 2.6 10*3/uL (ref 0.7–4.0)
LYMPHS PCT: 24.4 % (ref 12.0–46.0)
MCHC: 32.6 g/dL (ref 30.0–36.0)
MCV: 96.7 fl (ref 78.0–100.0)
MONOS PCT: 4.7 % (ref 3.0–12.0)
Monocytes Absolute: 0.5 10*3/uL (ref 0.1–1.0)
NEUTROS ABS: 7.5 10*3/uL (ref 1.4–7.7)
Neutrophils Relative %: 68.8 % (ref 43.0–77.0)
PLATELETS: 261 10*3/uL (ref 150.0–400.0)
RBC: 3.59 Mil/uL — ABNORMAL LOW (ref 3.87–5.11)
RDW: 15.1 % (ref 11.5–15.5)
WBC: 10.8 10*3/uL — ABNORMAL HIGH (ref 4.0–10.5)

## 2017-03-05 ENCOUNTER — Telehealth: Payer: Self-pay | Admitting: Internal Medicine

## 2017-03-05 ENCOUNTER — Emergency Department (HOSPITAL_COMMUNITY)
Admission: EM | Admit: 2017-03-05 | Discharge: 2017-03-05 | Disposition: A | Discharge status: Home / Self Care | Payer: 59 | Attending: Emergency Medicine | Admitting: Emergency Medicine

## 2017-03-05 ENCOUNTER — Encounter (HOSPITAL_COMMUNITY): Payer: Self-pay | Admitting: Emergency Medicine

## 2017-03-05 ENCOUNTER — Emergency Department (HOSPITAL_COMMUNITY): Payer: 59

## 2017-03-05 DIAGNOSIS — K922 Gastrointestinal hemorrhage, unspecified: Secondary | ICD-10-CM

## 2017-03-05 DIAGNOSIS — I10 Essential (primary) hypertension: Secondary | ICD-10-CM | POA: Insufficient documentation

## 2017-03-05 DIAGNOSIS — Z8719 Personal history of other diseases of the digestive system: Secondary | ICD-10-CM | POA: Insufficient documentation

## 2017-03-05 DIAGNOSIS — R109 Unspecified abdominal pain: Secondary | ICD-10-CM

## 2017-03-05 DIAGNOSIS — F17210 Nicotine dependence, cigarettes, uncomplicated: Secondary | ICD-10-CM | POA: Diagnosis not present

## 2017-03-05 DIAGNOSIS — Z79899 Other long term (current) drug therapy: Secondary | ICD-10-CM | POA: Diagnosis not present

## 2017-03-05 DIAGNOSIS — R1030 Lower abdominal pain, unspecified: Secondary | ICD-10-CM | POA: Diagnosis not present

## 2017-03-05 LAB — COMPREHENSIVE METABOLIC PANEL
ALK PHOS: 57 U/L (ref 38–126)
ALT: 43 U/L (ref 14–54)
ANION GAP: 11 (ref 5–15)
AST: 35 U/L (ref 15–41)
Albumin: 3.5 g/dL (ref 3.5–5.0)
BILIRUBIN TOTAL: 0.6 mg/dL (ref 0.3–1.2)
BUN: 13 mg/dL (ref 6–20)
CALCIUM: 9.1 mg/dL (ref 8.9–10.3)
CO2: 26 mmol/L (ref 22–32)
CREATININE: 0.63 mg/dL (ref 0.44–1.00)
Chloride: 97 mmol/L — ABNORMAL LOW (ref 101–111)
GFR calc non Af Amer: >60 mL/min (ref >60)
Glucose, Bld: 110 mg/dL — ABNORMAL HIGH (ref 65–99)
Potassium: 3.9 mmol/L (ref 3.5–5.1)
SODIUM: 134 mmol/L — AB (ref 135–145)
TOTAL PROTEIN: 6.7 g/dL (ref 6.5–8.1)

## 2017-03-05 LAB — CBC
HEMATOCRIT: 35 % — AB (ref 36.0–46.0)
HEMOGLOBIN: 11.1 g/dL — AB (ref 12.0–15.0)
MCH: 31 pg (ref 26.0–34.0)
MCHC: 31.7 g/dL (ref 30.0–36.0)
MCV: 97.8 fL (ref 78.0–100.0)
Platelets: 240 10*3/uL (ref 150–400)
RBC: 3.58 MIL/uL — ABNORMAL LOW (ref 3.87–5.11)
RDW: 15.3 % (ref 11.5–15.5)
WBC: 11 10*3/uL — ABNORMAL HIGH (ref 4.0–10.5)

## 2017-03-05 LAB — TYPE AND SCREEN
ABO/RH(D): A POS
ANTIBODY SCREEN: NEGATIVE

## 2017-03-05 MED ORDER — ONDANSETRON HCL 4 MG/2ML IJ SOLN
4.0000 mg | Freq: Once | INTRAMUSCULAR | Status: AC
  Administered 2017-03-05: 4 mg via INTRAVENOUS
  Filled 2017-03-05: qty 2

## 2017-03-05 MED ORDER — HYOSCYAMINE SULFATE SL 0.125 MG SL SUBL: 1.0000 | SUBLINGUAL_TABLET | Freq: Four times a day (QID) | SUBLINGUAL | 0 refills | Status: DC | PRN

## 2017-03-05 MED ORDER — HYOSCYAMINE SULFATE SL 0.125 MG SL SUBL: SUBLINGUAL_TABLET | SUBLINGUAL | 0 refills | Status: DC

## 2017-03-05 MED ORDER — HYOSCYAMINE SULFATE 0.125 MG SL SUBL
0.1250 mg | SUBLINGUAL_TABLET | Freq: Once | SUBLINGUAL | Status: AC
  Administered 2017-03-05: 0.125 mg via SUBLINGUAL

## 2017-03-05 MED ORDER — IOPAMIDOL (ISOVUE-300) INJECTION 61%
100.0000 mL | Freq: Once | INTRAVENOUS | Status: AC | PRN
  Administered 2017-03-05: 100 mL via INTRAVENOUS

## 2017-03-05 MED ORDER — TRAMADOL HCL 50 MG PO TABS: 50.0000 mg | ORAL_TABLET | Freq: Four times a day (QID) | ORAL | 0 refills | Status: DC | PRN

## 2017-03-05 MED ORDER — FENTANYL CITRATE (PF) 100 MCG/2ML IJ SOLN
50.0000 ug | INTRAMUSCULAR | Status: DC | PRN
  Administered 2017-03-05 (×2): 50 ug via INTRAVENOUS
  Filled 2017-03-05 (×2): qty 2

## 2017-03-05 MED ORDER — SODIUM CHLORIDE 0.9 % IV BOLUS (SEPSIS)
500.0000 mL | Freq: Once | INTRAVENOUS | Status: AC
  Administered 2017-03-05: 500 mL via INTRAVENOUS

## 2017-03-05 MED ORDER — HYDROCODONE-ACETAMINOPHEN 5-325 MG PO TABS
2.0000 | ORAL_TABLET | ORAL | 0 refills | Status: DC | PRN
Start: 2017-03-05 — End: 2017-06-09

## 2017-03-05 MED ORDER — SODIUM CHLORIDE 0.9 % IV SOLN
Freq: Once | INTRAVENOUS | Status: AC
  Administered 2017-03-05: 19:00:00 via INTRAVENOUS

## 2017-03-05 MED ORDER — HYOSCYAMINE SULFATE 0.125 MG PO TABS
0.1250 mg | ORAL_TABLET | Freq: Once | ORAL | Status: DC
  Filled 2017-03-05: qty 1

## 2017-03-05 NOTE — Telephone Encounter (Signed)
Communication noted. Patient is finishing a course of Cipro and Flagyl.  She is now reporting new symptoms. This could be side effect of her new medication (ie, cosentyx) plus/minus nonsteroidal effect. Less likely ischemic colitis or infectious process. Would back off to a low residue/full liquid diet for now. Hold off on Anusol cream.  Avoid nonsteroidals including ibuprofen for the time being. She's had symptoms now in evolution for a good week and a half. Trial of Levsin sublingual 0.125 mg tablets. Dispense 30. May take one under the tongue before meals at bedtime as needed for abdominal cramps and loose stool. If she is not markedly improved by the first of next week,  We may  need to consider another colonoscopy.

## 2017-03-05 NOTE — Telephone Encounter (Signed)
Please call patient, still bleeding and is having a new abd pain. [phone]

## 2017-03-05 NOTE — Telephone Encounter (Signed)
Routing message 

## 2017-03-05 NOTE — Telephone Encounter (Signed)
Communication noted. We'll leave off Levsin.

## 2017-03-05 NOTE — ED Provider Notes (Signed)
[facility] EMERGENCY DEPARTMENT Provider Note   CSN: [id number] Arrival date & time: 03/05/17  1727     History   Chief Complaint Chief Complaint  Patient presents with  . GI Bleeding    HPI Dawn Hayes is a 46 y.o. female. Chief complaint is abdominal pain, and GI bleeding.   HPI 46 year old female. History of known diverticulitis. History of previous episodes of GI bleeding. Follows with Dr.Rehman. Has had bleeding intermittently for the last 2 weeks. Seen and evaluated at the clinic in [place] 8 days ago. Was given a seven-day course of Cipro and Flagyl. No imaging done at patient's request as she has had multiple imaging studies in the past.  Was not really having pain until the last 2 days she has developed some lower abdominal pain and crampinCipro and Flagyl yesterday. Contact her physician Dr. Melony Overly today. Was given a prescription or at least offered a prescription for Levsin. She declined stating she would "rather going on. She is not dizzy or lightheaded. Pain is intermittent.    most recent colonoscopy December 2017 showed few diverticuli, and internal hemorrhoid Past Medical History:  Diagnosis Date  . Anemia   . Arthritis   . Costochondritis   . Diverticulitis   . Dysmenorrhea   . GERD (gastroesophageal reflux disease)   . Headache    Migraines  . History of hiatal hernia   . Hypertension   . Menorrhagia   . Psoriatic arthritis ([place])   . Smoker   . Thyroid disease     Patient Active Problem List   Diagnosis Date Noted  . Anxiety and depression 05/27/2016  . Status post total abdominal hysterectomy 04/08/2016  . Costochondritis 12/04/2015  . Maxillary sinusitis, acute 12/04/2015  . Anemia, iron deficiency 10/10/2015  . Tick bites 10/10/2015  . Fatigue 10/10/2015  . Allergic rhinitis 09/07/2015  . Hemorrhoid   . Diverticulosis of colon without hemorrhage   . Hiatal hernia   . Anemia 05/08/2015  . Rectal bleeding 05/08/2015  . Chest pain,  rule out acute myocardial infarction 09/27/2013  . Abnormal finding on EKG 09/16/2013  . Psoriasis 09/16/2013  . Psoriatic arthritis ([place]) 09/16/2013  . HTN (hypertension) 09/16/2013  . Morbid obesity, unspecified obesity type ([place]) 09/16/2013  . GERD 12/11/2009  . DIARRHEA, CHRONIC 12/11/2009    Past Surgical History:  Procedure Laterality Date  . CARPAL TUNNEL RELEASE Left 01/19/2015   Procedure: LEFT CARPAL TUNNEL RELEASE;  Surgeon: Roseanne Kaufman, MD;  Location: [facility];  Service: Orthopedics;  Laterality: Left;  . CHOLECYSTECTOMY    . CHOLECYSTECTOMY, LAPAROSCOPIC  2006  . COLONOSCOPY  1998   normal, [place]  . COLONOSCOPY N/A 05/30/2015   RMR: Minimal anal canal hemorrhoids. colonic diverticulosis. I suspect relatively trivial recent gI Bleed likely related to hemorrhoids. This finding alone would not likely adequatly explain her degree of anemia. she likely has a significent component from menstrual losses. , etc.   . CYSTOSCOPY N/A 04/08/2016   Procedure: Consuela Mimes;  Surgeon: Nunzio Cobbs, MD;  Location: [facility];  Service: Gynecology;  Laterality: N/A;  . DILATATION & CURETTAGE/HYSTEROSCOPY WITH MYOSURE N/A 01/04/2016   Procedure: DILATATION & CURETTAGE/HYSTEROSCOPY;  Surgeon: Nunzio Cobbs, MD;  Location: [facility];  Service: Gynecology;  Laterality: N/A;  . ESOPHAGOGASTRODUODENOSCOPY  09/11/99   tiny esophageal erosions with mild erosive reflux/no barrett's/normal stomach  . ESOPHAGOGASTRODUODENOSCOPY N/A 05/30/2015   RMR: Hiatal hernia otherwise negative EGD  . HYSTERECTOMY ABDOMINAL WITH SALPINGECTOMY  Bilateral 04/08/2016   Procedure: HYSTERECTOMY ABDOMINAL WITH SALPINGECTOMY;  Surgeon: Nunzio Cobbs, MD;  Location: [facility];  Service: Gynecology;  Laterality: Bilateral;  . LAPAROSCOPIC BILATERAL SALPINGECTOMY N/A 04/08/2016   Procedure: LAPAROSCOPIC BILATERAL SALPINGECTOMY possible BSO;  Surgeon: Nunzio Cobbs, MD;   Location: [facility];  Service: Gynecology;  Laterality: N/A;  . LAPAROSCOPIC HYSTERECTOMY N/A 04/08/2016   Procedure: ATTEMPTED HYSTERECTOMY TOTAL LAPAROSCOPIC;  Surgeon: Nunzio Cobbs, MD;  Location: [facility];  Service: Gynecology;  Laterality: N/A;  . LAPAROSCOPIC LYSIS OF ADHESIONS  04/08/2016   Procedure: LAPAROSCOPIC LYSIS OF ADHESIONS;  Surgeon: Nunzio Cobbs, MD;  Location: [facility];  Service: Gynecology;;    OB History    Gravida Para Term Preterm AB Living   0             SAB TAB Ectopic Multiple Live Births                   Home Medications    Prior to Admission medications   Medication Sig Start Date End Date Taking? Authorizing Provider  albuterol (PROVENTIL HFA;VENTOLIN HFA) 108 (90 Base) MCG/ACT inhaler Inhale 2 puffs into the lungs every 6 (six) hours as needed for wheezing or shortness of breath. 07/09/16   Midge Minium, MD  ciprofloxacin (CIPRO) 500 MG tablet Take 1 tablet by mouth 2 (two) times daily. 02/25/17   [provider]  diclofenac sodium (VOLTAREN) 1 % GEL Apply 4 g topically 4 (four) times daily. Patient taking differently: Apply 4 g topically 4 (four) times daily as needed (for pain).  12/08/16   Midge Minium, MD  esomeprazole ([place]) 40 MG capsule TAKE 1 CAPSULE(40 MG) BY MOUTH DAILY BEFORE BREAKFAST Patient taking differently: Take 40 mg by mouth once a day 01/28/17   Annitta Needs, NP  hydrochlorothiazide (HYDRODIURIL) 25 MG tablet TAKE 1 TABLET BY MOUTH EVERY DAY Patient taking differently: Take 25 mg by mouth once a day 12/29/16   Midge Minium, MD  HYDROcodone-acetaminophen (NORCO/VICODIN) 5-325 MG tablet Take 1 tablet by mouth every 4 (four) hours as needed. 02/11/17   Isla Pence, MD  hydrocortisone (ANUSOL-HC) 2.5 % rectal cream Place 1 application rectally 3 (three) times daily. X 5 days 03/03/17   Daneil Dolin, MD  Hyoscyamine Sulfate SL (LEVSIN/SL) 0.125 MG SUBL 1 sl q 6 hrs prn abdominal pain  03/05/17   Tanna Furry, MD  ibuprofen (ADVIL,MOTRIN) 800 MG tablet Take 800 mg by mouth every 8 (eight) hours as needed for mild pain or moderate pain.    [provider]  lactobacillus acidophilus (BACID) TABS tablet Take 1 tablet by mouth daily.    [provider]  lisinopril (PRINIVIL,ZESTRIL) 10 MG tablet TAKE 1 TABLET BY MOUTH EVERY DAY Patient taking differently: Take 10 mg by mouth once a day 12/29/16   Midge Minium, MD  lisinopril (PRINIVIL,ZESTRIL) 10 MG tablet daily. 01/29/17   [provider]  Multiple Vitamin (MULTIVITAMIN WITH MINERALS) TABS tablet Take 1 tablet by mouth daily.    [provider]  predniSONE (DELTASONE) 5 MG tablet Take 7 mg by mouth daily.    [provider]  Secukinumab (COSENTYX 300 DOSE) 150 MG/ML SOSY Inject 300 mg into the skin every 28 (twenty-eight) days.     [provider]  traMADol (ULTRAM) 50 MG tablet Take 1 tablet (50 mg total) by mouth every 6 (six) hours as needed. 03/05/17  Tanna Furry, MD  Shirley Friar 5 % SOLN Instill 1 drop into both eyes two times a day 10/10/16   [provider]    Family History Family History  Problem Relation Age of Onset  . Brain cancer Father   . Prostate cancer Father   . Hyperlipidemia Mother   . Colon cancer Maternal Grandmother 65  . Stroke Paternal Grandfather        COD  . Aneurysm Maternal Grandfather     Social History Social History  Substance Use Topics  . Smoking status: Current Every Day Smoker    Packs/day: 0.10    Years: 20.00    Types: Cigarettes  . Smokeless tobacco: Never Used     Comment: smoking 5 cigarettes a day, hasn't quit completely   . Alcohol use 9.6 oz/week    6 Glasses of wine, 10 Standard drinks or equivalent per week     Comment: socially, weekends 4-5     Allergies   Aspirin; Toradol [ketorolac tromethamine]; and Adhesive [tape]   Review of Systems Review of Systems  Constitutional: Negative for appetite  change, chills, diaphoresis, fatigue and fever.  HENT: Negative for mouth sores, sore throat and trouble swallowing.   Eyes: Negative for visual disturbance.  Respiratory: Negative for cough, chest tightness, shortness of breath and wheezing.   Cardiovascular: Negative for chest pain.  Gastrointestinal: Positive for abdominal pain and blood in stool. Negative for abdominal distention, diarrhea, nausea and vomiting.  Endocrine: Negative for polydipsia, polyphagia and polyuria.  Genitourinary: Negative for dysuria, frequency and hematuria.  Musculoskeletal: Negative for gait problem.  Skin: Negative for color change, pallor and rash.  Neurological: Negative for dizziness, syncope, light-headedness and headaches.  Hematological: Does not bruise/bleed easily.  Psychiatric/Behavioral: Negative for behavioral problems and confusion.     Physical Exam Updated Vital Signs BP 106/72   Pulse (!) 109   Temp 98.4 F (36.9 C) (Oral)   Resp 19   Ht 5\' 1"  (1.549 m)   Wt 124.7 kg (275 lb)   LMP 02/28/2016   SpO2 95%   BMI 51.96 kg/m   Physical Exam  Constitutional: She is oriented to person, place, and time. She appears well-developed and well-nourished. No distress.  HENT:  Head: Normocephalic.  No scleral icterus. Conjunctiva not pale.No scleral icterus.  Conjunctival not pale.  Eyes: Pupils are equal, round, and reactive to light. Conjunctivae are normal. No scleral icterus.  Neck: Normal range of motion. Neck supple. No thyromegaly present.  Cardiovascular: Normal rate and regular rhythm.  Exam reveals no gallop and no friction rub.   No murmur heard. Pulmonary/Chest: Effort normal and breath sounds normal. No respiratory distress. She has no wheezes. She has no rales.  Abdominal: Soft. Bowel sounds are normal. She exhibits no distension. There is no tenderness. There is no rebound.  Abdomen soft. No focal tenderness. No peritoneal rotation.    Musculoskeletal: Normal range of motion.    Neurological: She is alert and oriented to person, place, and time.  Skin: Skin is warm and dry. No rash noted.  Psychiatric: She has a normal mood and affect. Her behavior is normal.     ED Treatments / Results  Labs (all labs ordered are listed, but only abnormal results are displayed) Labs Reviewed  COMPREHENSIVE METABOLIC PANEL - Abnormal; Notable for the following:       Result Value   Sodium 134 (*)    Chloride 97 (*)    Glucose, Bld 110 (*)  All other components within normal limits  CBC - Abnormal; Notable for the following:    WBC 11.0 (*)    RBC 3.58 (*)    Hemoglobin 11.1 (*)    HCT 35.0 (*)    All other components within normal limits  POC OCCULT BLOOD, ED  TYPE AND SCREEN    EKG  EKG Interpretation None       Radiology Ct Abdomen Pelvis W Contrast  Result Date: 03/05/2017 CLINICAL DATA:  Patient with bright red blood per rectum for 2 weeks. Lower abdominal pain. EXAM: CT ABDOMEN AND PELVIS WITH CONTRAST TECHNIQUE: Multidetector CT imaging of the abdomen and pelvis was performed using the standard protocol following bolus administration of intravenous contrast. CONTRAST:  176mL ISOVUE-300 IOPAMIDOL (ISOVUE-300) INJECTION 61% COMPARISON:  None. FINDINGS: Lower chest: Normal heart size. No pericardial effusion. Dependent atelectasis within the bilateral lower lobes. Hepatobiliary: Liver is normal in size and contour. Gallbladder surgically absent. No intrahepatic or extrahepatic biliary ductal dilatation. Pancreas: Unremarkable Spleen: Unremarkable Adrenals/Urinary Tract: The adrenal glands are normal. Kidneys enhance symmetrically with contrast. No hydronephrosis. Urinary bladder is unremarkable. Stomach/Bowel: No abnormal bowel wall thickening or evidence for bowel obstruction. Normal appendix. Normal morphology of the stomach. No free fluid or free intraperitoneal air. Vascular/Lymphatic: Normal caliber abdominal aorta. No retroperitoneal lymphadenopathy.  Peripheral calcified atherosclerotic plaque. Reproductive: Uterus is surgically absent. Adnexal structures are unremarkable. Other: Unremarkable. Musculoskeletal: No aggressive or acute appearing osseous lesions. IMPRESSION: No acute process within the abdomen or pelvis. Electronically Signed   By: Lovey Newcomer M.D.   On: 03/05/2017 19:55    Procedures Procedures (including critical care time)  Medications Ordered in ED Medications  fentaNYL (SUBLIMAZE) injection 50 mcg (50 mcg Intravenous Given 03/05/17 2011)  sodium chloride 0.9 % bolus 500 mL (0 mLs Intravenous Stopped 03/05/17 2003)  0.9 %  sodium chloride infusion ( Intravenous New Bag/Given 03/05/17 1904)  ondansetron (ZOFRAN) injection 4 mg (4 mg Intravenous Given 03/05/17 1905)  iopamidol (ISOVUE-300) 61 % injection 100 mL (100 mLs Intravenous Contrast Given 03/05/17 1928)     Initial Impression / Assessment and Plan / ED Course  I have reviewed the triage vital signs and the nursing notes.  Pertinent labs & imaging results that were available during my care of the patient were reviewed by me and considered in my medical decision making (see chart for details).     Stable hemoglobin from yesterday. No leukocytosis. CT does not show active disease. Patient is not tachycardic, hypotensive, dizzy, lightheaded or anemic. Anxious appropriate for discharge home. We'll offer Levsin. Contact her GI physician tomorrow for further treatment plan  Final Clinical Impressions(s) / ED Diagnoses   Final diagnoses:  Lower GI bleeding  Abdominal pain, unspecified abdominal location    New Prescriptions New Prescriptions   HYOSCYAMINE SULFATE SL (LEVSIN/SL) 0.125 MG SUBL    1 sl q 6 hrs prn abdominal pain   TRAMADOL (ULTRAM) 50 MG TABLET    Take 1 tablet (50 mg total) by mouth every 6 (six) hours as needed.     Tanna Furry, MD 03/05/17 2030

## 2017-03-05 NOTE — Telephone Encounter (Incomplete)
Spoke with pt and pt wasn't interested in starting a new medication. I explained to pt that the sublingual tab would help with loose stool and stomach pain. Pt states she does want to stop her symptoms, she would like to find out what's causing it. I also explained to pt that if she was no better by the first of the week, Dr. Gala Romney would consider doing a colonoscopy. Med hasn't been sent to pharmacy per pts request.

## 2017-03-05 NOTE — ED Triage Notes (Signed)
Patient complains of lower abdominal with bright red blood in stools x 1 week. She states pain in lower GI has gotten worse. Patient was seen by Dr. Sydell Axon and sent of CBC yesterday. Patient's lab work shows decreased hemoglobin and RBC.

## 2017-03-05 NOTE — Telephone Encounter (Signed)
Dr. Gala Romney I was calling pt to go over results and pt phoned our office with concerns of bleeding. Pt is still having some bleeding with stabbing stomach pain. Pt is able to eat, has mild nausea and loose stools.

## 2017-03-05 NOTE — Discharge Instructions (Signed)
Call Dr. Laural Golden tomorrow for update on your bleeding and symptoms Return to ER with any worsening: Dizziness, lightheaded, worsening pain, passing out Levsin for Abdominal Pain

## 2017-03-06 ENCOUNTER — Telehealth: Payer: Self-pay

## 2017-03-06 ENCOUNTER — Telehealth: Payer: Self-pay | Admitting: Internal Medicine

## 2017-03-06 ENCOUNTER — Encounter (HOSPITAL_COMMUNITY)
Admission: RE | Admit: 2017-03-06 | Discharge: 2017-03-06 | Disposition: A | Discharge status: Home / Self Care | Payer: 59 | Source: Ambulatory Visit | Attending: Internal Medicine | Admitting: Internal Medicine

## 2017-03-06 ENCOUNTER — Encounter (HOSPITAL_COMMUNITY): Payer: Self-pay

## 2017-03-06 ENCOUNTER — Other Ambulatory Visit: Payer: Self-pay

## 2017-03-06 DIAGNOSIS — K625 Hemorrhage of anus and rectum: Secondary | ICD-10-CM

## 2017-03-06 MED ORDER — NA SULFATE-K SULFATE-MG SULF 17.5-3.13-1.6 GM/177ML PO SOLN: 1.0000 | ORAL | 0 refills | Status: DC

## 2017-03-06 NOTE — Telephone Encounter (Signed)
Telephone report from patient noted. CT reviewed. Nothing acute and nothing to explain symptoms.  CBC is stable. I doubt she is having significant bleeding. However, next step is a colonoscopy. So, let us offer the patient a diagnostic colonoscopy under propofol on 1029. Thanks.

## 2017-03-06 NOTE — Telephone Encounter (Signed)
Pt called to let Dr.Rourk know that she went the the ER last night due a pain in abdomen and continuous rectal bleeding. A CAT scan was done on pt. Please review notes in chart. Pt stated that they recommend that pt have a colonoscopy. Pt was given RX Levsin. PT wants a call back from Dr. Gala Romney.   I explained to pt that I discussed Dr.Rourk giving her Levsin yesterday and following up to discuss a colonoscopy. Pt is aware and wants to know what her next step is. Pt knows she may not receive a call back today.

## 2017-03-06 NOTE — Telephone Encounter (Signed)
done

## 2017-03-06 NOTE — Telephone Encounter (Signed)
PA info for Colonoscopy submitted via [facility] website. Case approved. PA# [id number].

## 2017-03-06 NOTE — Telephone Encounter (Signed)
Endo scheduler advised that pt will receive a pre-op phone call today since she has current labs.  Called pt. Informed her that RMR wants TCS 03/09/17. Procedure time is 1:15pm. Rx for prep sent to pharmacy. Pt is aware that instructions are being faxed to pharmacy for her to pick up when she gets prep. Verbally gave her instructions on the phone also. She is aware she will be receiving pre-op phone call today. Orders entered.

## 2017-03-06 NOTE — Telephone Encounter (Signed)
Pt notified of Dr. Roseanne Kaufman message. Routing to schedule procedure.

## 2017-03-09 ENCOUNTER — Encounter (HOSPITAL_COMMUNITY): Payer: Self-pay

## 2017-03-09 ENCOUNTER — Ambulatory Visit (HOSPITAL_COMMUNITY)
Admission: RE | Admit: 2017-03-09 | Discharge: 2017-03-09 | Disposition: A | Discharge status: Home / Self Care | Payer: 59 | Source: Ambulatory Visit | Attending: Internal Medicine | Admitting: Internal Medicine

## 2017-03-09 ENCOUNTER — Ambulatory Visit (HOSPITAL_COMMUNITY): Payer: 59 | Admitting: Anesthesiology

## 2017-03-09 ENCOUNTER — Encounter (HOSPITAL_COMMUNITY)
Admission: RE | Disposition: A | Discharge status: Home / Self Care | Payer: Self-pay | Source: Ambulatory Visit | Attending: Internal Medicine

## 2017-03-09 DIAGNOSIS — K5730 Diverticulosis of large intestine without perforation or abscess without bleeding: Secondary | ICD-10-CM | POA: Insufficient documentation

## 2017-03-09 DIAGNOSIS — K219 Gastro-esophageal reflux disease without esophagitis: Secondary | ICD-10-CM | POA: Diagnosis not present

## 2017-03-09 DIAGNOSIS — Z886 Allergy status to analgesic agent status: Secondary | ICD-10-CM | POA: Diagnosis not present

## 2017-03-09 DIAGNOSIS — Z6841 Body Mass Index (BMI) 40.0 and over, adult: Secondary | ICD-10-CM | POA: Diagnosis not present

## 2017-03-09 DIAGNOSIS — I10 Essential (primary) hypertension: Secondary | ICD-10-CM | POA: Diagnosis not present

## 2017-03-09 DIAGNOSIS — F17210 Nicotine dependence, cigarettes, uncomplicated: Secondary | ICD-10-CM | POA: Insufficient documentation

## 2017-03-09 DIAGNOSIS — K625 Hemorrhage of anus and rectum: Secondary | ICD-10-CM

## 2017-03-09 DIAGNOSIS — K921 Melena: Secondary | ICD-10-CM | POA: Diagnosis not present

## 2017-03-09 DIAGNOSIS — K641 Second degree hemorrhoids: Secondary | ICD-10-CM | POA: Insufficient documentation

## 2017-03-09 HISTORY — PX: COLONOSCOPY WITH PROPOFOL: SHX5780

## 2017-03-09 SURGERY — COLONOSCOPY WITH PROPOFOL
Anesthesia: Monitor Anesthesia Care

## 2017-03-09 MED ORDER — LIDOCAINE VISCOUS 2 % MT SOLN
OROMUCOSAL | Status: AC
  Filled 2017-03-09: qty 15

## 2017-03-09 MED ORDER — PROPOFOL 500 MG/50ML IV EMUL
INTRAVENOUS | Status: DC | PRN
  Administered 2017-03-09: 14:00:00 via INTRAVENOUS
  Administered 2017-03-09: 150 ug/kg/min via INTRAVENOUS

## 2017-03-09 MED ORDER — FENTANYL CITRATE (PF) 100 MCG/2ML IJ SOLN
INTRAMUSCULAR | Status: AC
  Filled 2017-03-09: qty 2

## 2017-03-09 MED ORDER — PROPOFOL 10 MG/ML IV BOLUS
INTRAVENOUS | Status: AC
  Filled 2017-03-09: qty 20

## 2017-03-09 MED ORDER — FENTANYL CITRATE (PF) 100 MCG/2ML IJ SOLN
25.0000 ug | Freq: Once | INTRAMUSCULAR | Status: AC
  Administered 2017-03-09: 25 ug via INTRAVENOUS

## 2017-03-09 MED ORDER — MIDAZOLAM HCL 2 MG/2ML IJ SOLN
1.0000 mg | INTRAMUSCULAR | Status: AC
  Administered 2017-03-09: 2 mg via INTRAVENOUS

## 2017-03-09 MED ORDER — CHLORHEXIDINE GLUCONATE CLOTH 2 % EX PADS: 6.0000 | MEDICATED_PAD | Freq: Once | CUTANEOUS | Status: DC

## 2017-03-09 MED ORDER — PROPOFOL 10 MG/ML IV BOLUS
INTRAVENOUS | Status: DC | PRN
  Administered 2017-03-09 (×2): 10 mg via INTRAVENOUS
  Administered 2017-03-09 (×2): 20 mg via INTRAVENOUS

## 2017-03-09 MED ORDER — PROPOFOL 500 MG/50ML IV EMUL: INTRAVENOUS | Status: DC | PRN

## 2017-03-09 MED ORDER — MIDAZOLAM HCL 2 MG/2ML IJ SOLN
INTRAMUSCULAR | Status: AC
  Filled 2017-03-09: qty 2

## 2017-03-09 MED ORDER — MIDAZOLAM HCL 5 MG/5ML IJ SOLN
INTRAMUSCULAR | Status: DC | PRN
  Administered 2017-03-09: 2 mg via INTRAVENOUS

## 2017-03-09 MED ORDER — LACTATED RINGERS IV SOLN
INTRAVENOUS | Status: DC
  Administered 2017-03-09: 13:00:00 via INTRAVENOUS

## 2017-03-09 NOTE — H&P (View-Only) (Signed)
Primary Care Physician:  Midge Minium, MD Primary Gastroenterologist:  Dr. Gala Romney  Pre-Procedure History & Physical: HPI:  Dawn Hayes is a 46 y.o. female here for for evaluation of rectal bleeding-painless. For little over a week, patient has seen blood per rectum with bowel movements. Typically has 2 bowel movements daily; has seen some occasional red blood with each bowel movement. Absolutely has not had any abdominal pain, whatsoever. Seen in the ED recently for costochondritis and bleeding. Diagnosed with "diverticulitis" given bleeding by EDP. Again, she's never had any abdominal pain. No abdominal /pelvic CT recently. Finishing up a course of Cipro and Flagyl. Denies constipation or diarrhea.  GI evaluation here last year demonstrated scattered left-sided diverticula and minimal internal hemorrhoids only on colonoscopy. EGD demonstrating only a Hiatal Hernia. That was done for rectal bleeding and IDA. She underwent a hysterectomy last year for IDA. Most recent hemoglobin in Epic-12.3 on October 3.  She has continued to work .  she is ambulatory continues to drive. Hasn't had any melena, nausea or  vomiting.  GERD symptoms well controlled on esomeprazole 40 mg daily. Has significant trouble with recurrent costochondritis. She has been on nonsteroidals in the past along with prednisone and recently started to Cosyntex  Per her rheumatologist.  History of psoriatic arthritis.   Past Medical History:  Diagnosis Date  . Anemia   . Arthritis   . Costochondritis   . Diverticulitis   . Dysmenorrhea   . GERD (gastroesophageal reflux disease)   . Headache    Migraines  . History of hiatal hernia   . Hypertension   . Menorrhagia   . Psoriatic arthritis ([place])   . Smoker   . Thyroid disease     Past Surgical History:  Procedure Laterality Date  . CARPAL TUNNEL RELEASE Left 01/19/2015   Procedure: LEFT CARPAL TUNNEL RELEASE;  Surgeon: Roseanne Kaufman, MD;  Location: [facility];  Service: Orthopedics;  Laterality: Left;  . CHOLECYSTECTOMY    . CHOLECYSTECTOMY, LAPAROSCOPIC  2006  . COLONOSCOPY  1998   normal, [place]  . COLONOSCOPY N/A 05/30/2015   RMR: Minimal anal canal hemorrhoids. colonic diverticulosis. I suspect relatively trivial recent gI Bleed likely related to hemorrhoids. This finding alone would not likely adequatly explain her degree of anemia. she likely has a significent component from menstrual losses. , etc.   . CYSTOSCOPY N/A 04/08/2016   Procedure: Consuela Mimes;  Surgeon: Nunzio Cobbs, MD;  Location: [facility];  Service: Gynecology;  Laterality: N/A;  . DILATATION & CURETTAGE/HYSTEROSCOPY WITH MYOSURE N/A 01/04/2016   Procedure: DILATATION & CURETTAGE/HYSTEROSCOPY;  Surgeon: Nunzio Cobbs, MD;  Location: [facility];  Service: Gynecology;  Laterality: N/A;  . ESOPHAGOGASTRODUODENOSCOPY  09/11/99   tiny esophageal erosions with mild erosive reflux/no barrett's/normal stomach  . ESOPHAGOGASTRODUODENOSCOPY N/A 05/30/2015   RMR: Hiatal hernia otherwise negative EGD  . HYSTERECTOMY ABDOMINAL WITH SALPINGECTOMY Bilateral 04/08/2016   Procedure: HYSTERECTOMY ABDOMINAL WITH SALPINGECTOMY;  Surgeon: Nunzio Cobbs, MD;  Location: [facility];  Service: Gynecology;  Laterality: Bilateral;  . LAPAROSCOPIC BILATERAL SALPINGECTOMY N/A 04/08/2016   Procedure: LAPAROSCOPIC BILATERAL SALPINGECTOMY possible BSO;  Surgeon: Nunzio Cobbs, MD;  Location: [facility];  Service: Gynecology;  Laterality: N/A;  . LAPAROSCOPIC HYSTERECTOMY N/A 04/08/2016   Procedure: ATTEMPTED HYSTERECTOMY TOTAL LAPAROSCOPIC;  Surgeon: Nunzio Cobbs, MD;  Location: [facility];  Service: Gynecology;  Laterality: N/A;  . LAPAROSCOPIC LYSIS OF ADHESIONS  04/08/2016   Procedure: LAPAROSCOPIC LYSIS OF ADHESIONS;  Surgeon: Nunzio Cobbs, MD;  Location: [facility];  Service: Gynecology;;    Prior to Admission medications   Medication Sig  Start Date End Date Taking? Authorizing Provider  albuterol (PROVENTIL HFA;VENTOLIN HFA) 108 (90 Base) MCG/ACT inhaler Inhale 2 puffs into the lungs every 6 (six) hours as needed for wheezing or shortness of breath. 07/09/16  Yes Midge Minium, MD  ciprofloxacin (CIPRO) 500 MG tablet Take 1 tablet by mouth 2 (two) times daily. 02/25/17  Yes [provider]  diclofenac sodium (VOLTAREN) 1 % GEL Apply 4 g topically 4 (four) times daily. Patient taking differently: Apply 4 g topically 4 (four) times daily as needed (for pain).  12/08/16  Yes Midge Minium, MD  esomeprazole ([place]) 40 MG capsule TAKE 1 CAPSULE(40 MG) BY MOUTH DAILY BEFORE BREAKFAST Patient taking differently: Take 40 mg by mouth once a day 01/28/17  Yes Annitta Needs, NP  hydrochlorothiazide (HYDRODIURIL) 25 MG tablet TAKE 1 TABLET BY MOUTH EVERY DAY Patient taking differently: Take 25 mg by mouth once a day 12/29/16  Yes Midge Minium, MD  HYDROcodone-acetaminophen (NORCO/VICODIN) 5-325 MG tablet Take 1 tablet by mouth every 4 (four) hours as needed. 02/11/17  Yes Isla Pence, MD  ibuprofen (ADVIL,MOTRIN) 800 MG tablet Take 800 mg by mouth every 8 (eight) hours as needed for mild pain or moderate pain.   Yes [provider]  lactobacillus acidophilus (BACID) TABS tablet Take 1 tablet by mouth daily.   Yes [provider]  lisinopril (PRINIVIL,ZESTRIL) 10 MG tablet TAKE 1 TABLET BY MOUTH EVERY DAY Patient taking differently: Take 10 mg by mouth once a day 12/29/16  Yes Midge Minium, MD  lisinopril (PRINIVIL,ZESTRIL) 10 MG tablet daily. 01/29/17  Yes [provider]  metroNIDAZOLE (FLAGYL) 500 MG tablet Take 1 tablet by mouth daily. 02/25/17 03/04/17 Yes [provider]  Multiple Vitamin (MULTIVITAMIN WITH MINERALS) TABS tablet Take 1 tablet by mouth daily.   Yes [provider]  predniSONE (DELTASONE) 5 MG tablet Take 7 mg by mouth daily.   Yes [provider]  Secukinumab (COSENTYX 300 DOSE) 150 MG/ML SOSY Inject 300 mg into the skin every 28 (twenty-eight) days.    Yes [provider]  traMADol (ULTRAM) 50 MG tablet Take 50 mg by mouth every 6 (six) hours as needed for pain. 02/06/17  Yes [provider]  XIIDRA 5 % SOLN Instill 1 drop into both eyes two times a day 10/10/16  Yes [provider]  hydrocortisone (ANUSOL-HC) 2.5 % rectal cream Place 1 application rectally 3 (three) times daily. X 5 days 03/03/17   Daneil Dolin, MD    Allergies as of 03/03/2017 - Review Complete 03/03/2017  Allergen Reaction Noted  . Aspirin Other (See Comments) 12/06/2009  . Toradol [ketorolac tromethamine] Nausea And Vomiting 04/07/2016  . Adhesive [tape] Itching and Rash 09/26/2013    Family History  Problem Relation Age of Onset  . Brain cancer Father   . Prostate cancer Father   . Hyperlipidemia Mother   . Colon cancer Maternal Grandmother 79  . Stroke Paternal Grandfather        COD  . Aneurysm Maternal Grandfather     Social History   Social History  . Marital status: Married    Spouse name: N/A  . Number of children: 0  . Years of education: N/A   Occupational History  . [employer]  [employer]    Social History Main Topics  . Smoking status: Current Every Day Smoker    Packs/day: 0.10    Years: 20.00    Types: Cigarettes  . Smokeless tobacco: Never Used     Comment: smoking 5 cigarettes a day, hasn't quit completely   . Alcohol use 9.6 oz/week    6 Glasses of wine, 10 Standard drinks or equivalent per week     Comment: socially, weekends 4-5  . Drug use: No  . Sexual activity: Yes    Partners: Male    Birth control/ protection: None   Other Topics Concern  . Not on file   Social History Narrative  . No narrative on file    Review of Systems: See HPI, otherwise negative ROS  Physical Exam: BP (!) 142/86   Pulse (!) 103   Temp (!) 97.5 F (36.4 C) (Oral)   Ht 5' 1.5" (1.562  m)   Wt 275 lb 6.4 oz (124.9 kg)   LMP 02/28/2016   BMI 51.19 kg/m  General:   Alert,  obese pleasant and cooperative in NAD Neck:  Supple; no masses or thyromegaly. No significant cervical adenopathy. Lungs:  Clear throughout to auscultation.   No wheezes, crackles, or rhonchi. No acute distress. Heart:  Regular rate and rhythm; no murmurs, clicks, rubs,  or gallops. Abdomen: massively obese. Positive bowel sounds. Entirely soft and nontender without obvious mass or organomegaly Pulses:  Normal pulses noted. Extremities:  Without clubbing or edema. Rectal: No external anal lesions.Digital exam reveals no mass or stool in rectal vault. Mucus is Hemoccult negative.   Impression:  Pleasant 46 year old morbidly obese lady with greater than 1 week history of painless low-volume hematochezia. Bleeding occurs with bowel movements almost exclusively. Denies nonsteroidals in the immediate past but has taken them previously. Known internal hemorrhoids. Colonoscopy findings recently as outlined. She does not at all look appear acutely ill, toxic or hemodynamically unstable at this time. Bleeding likely arising from an ano-rectal source. She's had absolutely no abdominal pain or recent cross-sectional imaging. I doubt the diagnosis of diverticulitis although diverticular bleeding is not absolutely excluded-also less likely.   Recommendations:   Complete blood count  Prescription for Anusol cream - dispense - one unit  - apply to the anus rectum 3x daily x 5 days  Further recommendations to follow.  Further evaluation maybe needed including a sigmoidoscopy.  In the unlikely event bleeding significantly worsens you should go to the ED      Notice: This dictation was prepared with Dragon dictation along with smaller phrase technology. Any transcriptional errors that result from this process are unintentional and may not be corrected upon review.

## 2017-03-09 NOTE — Anesthesia Postprocedure Evaluation (Signed)
Anesthesia Post Note  Patient: Dawn Hayes  Procedure(s) Performed: COLONOSCOPY WITH PROPOFOL (N/A )  Patient location during evaluation: PACU Anesthesia Type: MAC Level of consciousness: awake and alert and oriented Pain management: pain level controlled Vital Signs Assessment: post-procedure vital signs reviewed and stable Respiratory status: spontaneous breathing Cardiovascular status: blood pressure returned to baseline and stable Postop Assessment: no apparent nausea or vomiting and adequate PO intake Anesthetic complications: no     Last Vitals:  Vitals:   03/09/17 1400 03/09/17 1415  BP: (!) 101/59 124/80  Pulse: (!) 104 (!) 105  Resp:  (!) 27  Temp: 36.8 C   SpO2: 100% 100%    Last Pain:  Vitals:   03/09/17 1421  TempSrc:   PainSc: 0-No pain                 [name]

## 2017-03-09 NOTE — Anesthesia Preprocedure Evaluation (Signed)
Anesthesia Evaluation  Patient identified by MRN, date of birth, ID band Patient awake    Reviewed: Allergy & Precautions, NPO status , Patient's Chart, lab work & pertinent test results  Airway Mallampati: II  TM Distance: >3 FB Neck ROM: Full    Dental  (+) Teeth Intact   Pulmonary Current Smoker,    breath sounds clear to auscultation       Cardiovascular hypertension, Pt. on medications  Rhythm:Regular Rate:Normal     Neuro/Psych  Headaches, Anxiety  Neuromuscular disease    GI/Hepatic hiatal hernia, GERD  Controlled and Medicated,  Endo/Other    Renal/GU      Musculoskeletal  (+) Arthritis ,   Abdominal   Peds  Hematology  (+) anemia ,   Anesthesia Other Findings   Reproductive/Obstetrics                            Anesthesia Physical Anesthesia Plan  ASA: III  Anesthesia Plan: MAC   Post-op Pain Management:    Induction: Intravenous  PONV Risk Score and Plan:   Airway Management Planned: Simple Face Mask  Additional Equipment:   Intra-op Plan:   Post-operative Plan:   Informed Consent: I have reviewed the patients History and Physical, chart, labs and discussed the procedure including the risks, benefits and alternatives for the proposed anesthesia with the patient or authorized representative who has indicated his/her understanding and acceptance.     Plan Discussed with:   Anesthesia Plan Comments:         Anesthesia Quick Evaluation

## 2017-03-09 NOTE — Addendum Note (Signed)
Addendum  created 03/09/17 1437 by Ollen Bowl, CRNA   Anesthesia Staff edited

## 2017-03-09 NOTE — Discharge Instructions (Signed)
Colonoscopy Discharge Instructions  Read the instructions outlined below and refer to this sheet in the next few weeks. These discharge instructions provide you with general information on caring for yourself after you leave the hospital. Your doctor may also give you specific instructions. While your treatment has been planned according to the most current medical practices available, unavoidable complications occasionally occur. If you have any problems or questions after discharge, call Dr. Gala Romney at [phone]. ACTIVITY  You may resume your regular activity, but move at a slower pace for the next 24 hours.   Take frequent rest periods for the next 24 hours.   Walking will help get rid of the air and reduce the bloated feeling in your belly (abdomen).   No driving for 24 hours (because of the medicine (anesthesia) used during the test).    Do not sign any important legal documents or operate any machinery for 24 hours (because of the anesthesia used during the test).  NUTRITION  Drink plenty of fluids.   You may resume your normal diet as instructed by your doctor.   Begin with a light meal and progress to your normal diet. Heavy or fried foods are harder to digest and may make you feel sick to your stomach (nauseated).   Avoid alcoholic beverages for 24 hours or as instructed.  MEDICATIONS  You may resume your normal medications unless your doctor tells you otherwise.  WHAT YOU CAN EXPECT TODAY  Some feelings of bloating in the abdomen.   Passage of more gas than usual.   Spotting of blood in your stool or on the toilet paper.  IF YOU HAD POLYPS REMOVED DURING THE COLONOSCOPY:  No aspirin products for 7 days or as instructed.   No alcohol for 7 days or as instructed.   Eat a soft diet for the next 24 hours.  FINDING OUT THE RESULTS OF YOUR TEST Not all test results are available during your visit. If your test results are not back during the visit, make an appointment  with your caregiver to find out the results. Do not assume everything is normal if you have not heard from your caregiver or the medical facility. It is important for you to follow up on all of your test results.  SEEK IMMEDIATE MEDICAL ATTENTION IF:  You have more than a spotting of blood in your stool.   Your belly is swollen (abdominal distention).   You are nauseated or vomiting.   You have a temperature over 101.   You have abdominal pain or discomfort that is severe or gets worse throughout the day.    Diverticulosis Diverticulosis is a condition that develops when small pouches (diverticula) form in the wall of the large intestine (colon). The colon is where water is absorbed and stool is formed. The pouches form when the inside layer of the colon pushes through weak spots in the outer layers of the colon. You may have a few pouches or many of them. What are the causes? The cause of this condition is not known. What increases the risk? The following factors may make you more likely to develop this condition:  Being older than age 35. Your risk for this condition increases with age. Diverticulosis is rare among people younger than age 9. By age 47, many people have it.  Eating a low-fiber diet.  Having frequent constipation.  Being overweight.  Not getting enough exercise.  Smoking.  Taking over-the-counter pain medicines, like aspirin and ibuprofen.  Having  a family history of diverticulosis.  What are the signs or symptoms? In most people, there are no symptoms of this condition. If you do have symptoms, they may include:  Bloating.  Cramps in the abdomen.  Constipation or diarrhea.  Pain in the lower left side of the abdomen.  How is this diagnosed? This condition is most often diagnosed during an exam for other colon problems. Because diverticulosis usually has no symptoms, it often cannot be diagnosed independently. This condition may be diagnosed  by:  Using a flexible scope to examine the colon (colonoscopy).  Taking an X-ray of the colon after dye has been put into the colon (barium enema).  Doing a CT scan.  How is this treated? You may not need treatment for this condition if you have never developed an infection related to diverticulosis. If you have had an infection before, treatment may include:  Eating a high-fiber diet. This may include eating more fruits, vegetables, and grains.  Taking a fiber supplement.  Taking a live bacteria supplement (probiotic).  Taking medicine to relax your colon.  Taking antibiotic medicines.  Follow these instructions at home:  Drink 6-8 glasses of water or more each day to prevent constipation.  Try not to strain when you have a bowel movement.  If you have had an infection before: ? Eat more fiber as directed by your health care provider or your diet and nutrition specialist (dietitian). ? Take a fiber supplement or probiotic, if your health care provider approves.  Take over-the-counter and prescription medicines only as told by your health care provider.  If you were prescribed an antibiotic, take it as told by your health care provider. Do not stop taking the antibiotic even if you start to feel better.  Keep all follow-up visits as told by your health care provider. This is important. Contact a health care provider if:  You have pain in your abdomen.  You have bloating.  You have cramps.  You have not had a bowel movement in 3 days. Get help right away if:  Your pain gets worse.  Your bloating becomes very bad.  You have a fever or chills, and your symptoms suddenly get worse.  You vomit.  You have bowel movements that are bloody or black.  You have bleeding from your rectum. Summary  Diverticulosis is a condition that develops when small pouches (diverticula) form in the wall of the large intestine (colon).  You may have a few pouches or many of  them.  This condition is most often diagnosed during an exam for other colon problems.  If you have had an infection related to diverticulosis, treatment may include increasing the fiber in your diet, taking supplements, or taking medicines. This information is not intended to replace advice given to you by your health care provider. Make sure you discuss any questions you have with your health care provider. Document Released: 01/24/2004 Document Revised: 03/17/2016 Document Reviewed: 03/17/2016   Hemorrhoids Hemorrhoids are swollen veins in and around the rectum or anus. Hemorrhoids can cause pain, itching, or bleeding. Most of the time, they do not cause serious problems. They usually get better with diet changes, lifestyle changes, and other home treatments. Follow these instructions at home: Eating and drinking  Eat foods that have fiber, such as whole grains, beans, nuts, fruits, and vegetables. Ask your doctor about taking products that have added fiber (fibersupplements).  Drink enough fluid to keep your pee (urine) clear or pale yellow. For Pain  and Swelling  Take a warm-water bath (sitz bath) for 20 minutes to ease pain. Do this 3-4 times a day.  If directed, put ice on the painful area. It may be helpful to use ice between your warm baths. ? Put ice in a plastic bag. ? Place a towel between your skin and the bag. ? Leave the ice on for 20 minutes, 2-3 times a day. General instructions  Take over-the-counter and prescription medicines only as told by your doctor. ? Medicated creams and medicines that are inserted into the anus (suppositories) may be used or applied as told.  Exercise often.  Go to the bathroom when you have the urge to poop (to have a bowel movement). Do not wait.  Avoid pushing too hard (straining) when you poop.  Keep the butt area dry and clean. Use wet toilet paper or moist paper towels.  Do not sit on the toilet for a long time. Contact a doctor  if:  You have any of these: ? Pain and swelling that do not get better with treatment or medicine. ? Bleeding that will not stop. ? Trouble pooping or you cannot poop. ? Pain or swelling outside the area of the hemorrhoids. This information is not intended to replace advice given to you by your health care provider. Make sure you discuss any questions you have with your health care provider. Document Released: 02/05/2008 Document Revised: 10/04/2015 Document Reviewed: 01/10/2015 Elsevier Interactive Patient Education  2018 [employer].  Chartered certified accountant Patient Education  2017 [employer].    Diverticulosis and hemorrhoid information provided  Begin Benefiber 1 tablespoon daily for the next 30 days; then increase to 1 tablespoon twice daily  Anusol cream applied to the anorectum twice daily 10 days  Repeat colonoscopy for screening purposes in 10 years  Office visit with [place] in 6 weeks  Pamphlet on hemorrhoid banding provided

## 2017-03-09 NOTE — Transfer of Care (Signed)
Immediate Anesthesia Transfer of Care Note  Patient: Dawn Hayes  Procedure(s) Performed: COLONOSCOPY WITH PROPOFOL (N/A )  Patient Location: PACU  Anesthesia Type:MAC  Level of Consciousness: awake and alert   Airway & Oxygen Therapy: Patient Spontanous Breathing and Patient connected to nasal cannula oxygen  Post-op Assessment: Report given to RN  Post vital signs: Reviewed and stable  Last Vitals:  Vitals:   03/09/17 1310 03/09/17 1400  BP: 118/76 (!) (P) 101/59  Pulse:  (!) (P) 104  Resp: (!) 23   Temp:  (P) 36.8 C  SpO2: 95% (P) 100%    Last Pain:  Vitals:   03/09/17 1400  TempSrc:   PainSc: (P) 0-No pain         Complications: No apparent anesthesia complications

## 2017-03-09 NOTE — Op Note (Signed)
[facility] Patient Name: Dawn Hayes Procedure Date: 03/09/2017 1:01 PM MRN: [id number] Date of Birth: [birth year] Attending MD: Norvel Richards , MD CSN: [id number] Age: 46 Admit Type: Outpatient Procedure:                Colonoscopy Indications:              Hematochezia Providers:                Norvel Richards, MD, Selena Lesser, Lurline Del, RN, Randa Spike, Technician Referring MD:              Medicines:                Propofol per Anesthesia Complications:            No immediate complications. Estimated Blood Loss:     Estimated blood loss: none. Procedure:                Pre-Anesthesia Assessment:                           - Prior to the procedure, a History and Physical                            was performed, and patient medications and                            allergies were reviewed. The patient's tolerance of                            previous anesthesia was also reviewed. The risks                            and benefits of the procedure and the sedation                            options and risks were discussed with the patient.                            All questions were answered, and informed consent                            was obtained. Prior Anticoagulants: The patient has                            taken no previous anticoagulant or antiplatelet                            agents. ASA Grade Assessment: II - A patient with                            mild systemic disease. After reviewing the risks  and benefits, the patient was deemed in                            satisfactory condition to undergo the procedure.                           After obtaining informed consent, the colonoscope                            was passed under direct vision. Throughout the                            procedure, the patient's blood pressure, pulse, and                            oxygen saturations  were monitored continuously. The                            EC-[id number] ([id number]) scope was introduced through                            the and advanced to the 5 cm into the ileum. The                            colonoscopy was performed without difficulty. The                            patient tolerated the procedure well. The quality                            of the bowel preparation was adequate. Anatomical                            landmarks were photographed. The terminal ileum,                            ileocecal valve, appendiceal orifice, and rectum                            were photographed. The colonoscopy was performed                            without difficulty. The patient tolerated the                            procedure well. The quality of the bowel                            preparation was adequate. The entire colon was well                            visualized. The colonoscopy was performed without  difficulty. The patient tolerated the procedure                            well. The quality of the bowel preparation was                            adequate. Scope In: 1:41:14 PM Scope Out: 1:51:56 PM Scope Withdrawal Time: 0 hours 8 minutes 5 seconds  Total Procedure Duration: 0 hours 10 minutes 42 seconds  Findings:      The perianal and digital rectal examinations were normal.      Scattered small-mouthed diverticula were found in the sigmoid colon and       descending colon. Distal 5 cm of terminal ileal mucosa appeared normal       as well.      Non-bleeding internal hemorrhoids were found during retroflexion. The       hemorrhoids were Grade II (internal hemorrhoids that prolapse but reduce       spontaneously). Impression:               - Diverticulosis in the sigmoid colon and in the                            descending colon.                           - Non-bleeding internal hemorrhoids.                           - No  specimens collected. I suspect recent bleeding                            from hemorrhoidal origin. May be a reasonably good                            banding candidate. Moderate Sedation:      Moderate (conscious) sedation was personally administered by an       anesthesia professional. The following parameters were monitored: oxygen       saturation, heart rate, blood pressure, respiratory rate, EKG, adequacy       of pulmonary ventilation, and response to care. Total physician       intraservice time was 19 minutes. Recommendation:           - Repeat colonoscopy in 10 years for screening                            purposes.                           - Return to GI office in 6 weeks. Begin Benefiber                            daily. Anusol cream to the anorectum twice a day x                            10 days.                           -  Resume previous diet. Procedure Code(s):        --- Professional ---                           [phone], Colonoscopy, flexible; diagnostic, including                            collection of specimen(s) by brushing or washing,                            when performed (separate procedure) Diagnosis Code(s):        --- Professional ---                           K64.1, Second degree hemorrhoids                           K92.1, Melena (includes Hematochezia)                           K57.30, Diverticulosis of large intestine without                            perforation or abscess without bleeding CPT copyright 2016 American Medical Association. All rights reserved. The codes documented in this report are preliminary and upon coder review may  be revised to meet current compliance requirements. Cristopher Estimable. Raford Brissett, MD Norvel Richards, MD 03/09/2017 2:04:04 PM This report has been signed electronically. Number of Addenda: 0

## 2017-03-09 NOTE — Interval H&P Note (Signed)
History and Physical Interval Note:  03/09/2017 1:17 PM  Dawn Hayes  has presented today for surgery, with the diagnosis of rectal bleeding  The various methods of treatment have been discussed with the patient and family. After consideration of risks, benefits and other options for treatment, the patient has consented to  Procedure(s) with comments: COLONOSCOPY WITH PROPOFOL (N/A) - 1:15pm as a surgical intervention .  The patient's history has been reviewed, patient examined, no change in status, stable for surgery.  I have reviewed the patient's chart and labs.  Questions were answered to the patient's satisfaction.     [name]  Here for diagnostic colonoscopy. Low volume rectal bleeding. Relatively stable hemoglobin negative CT recently.  The risks, benefits, limitations, alternatives and imponderables have been reviewed with the patient. Questions have been answered. All parties are agreeable.

## 2017-03-10 ENCOUNTER — Telehealth: Payer: Self-pay | Admitting: Internal Medicine

## 2017-03-10 NOTE — Telephone Encounter (Signed)
Spoke with pt and she wants to have hemorrhoid banding before 6 weeks. At advised to use anusol cream as directed to help with hemorrhoids s/p TSC. Routing message to change pts apt with Dr. Gala Romney for possible banding in 4-6 weeks.

## 2017-03-10 NOTE — Telephone Encounter (Signed)
Pt had her colonoscopy done yesterday by [facility] and had a question for the nurse. Please call her at [phone]

## 2017-03-10 NOTE — Telephone Encounter (Signed)
CALLED PATIENT AND RESCHEDULED HER TO A BANDING APPT WITH [facility]

## 2017-03-11 ENCOUNTER — Encounter (HOSPITAL_COMMUNITY): Payer: Self-pay | Admitting: Internal Medicine

## 2017-03-19 ENCOUNTER — Other Ambulatory Visit: Payer: Self-pay | Admitting: Physician Assistant

## 2017-03-23 ENCOUNTER — Telehealth: Payer: Self-pay | Admitting: Family Medicine

## 2017-03-23 NOTE — Telephone Encounter (Signed)
Received medical necessity forms by email, placed in bin upfront w/charge sheet.

## 2017-03-31 ENCOUNTER — Ambulatory Visit: Payer: 59 | Admitting: Internal Medicine

## 2017-03-31 ENCOUNTER — Encounter: Payer: Self-pay | Admitting: Family Medicine

## 2017-04-01 ENCOUNTER — Other Ambulatory Visit: Payer: Self-pay

## 2017-04-01 ENCOUNTER — Encounter: Payer: Self-pay | Admitting: Physician Assistant

## 2017-04-01 ENCOUNTER — Ambulatory Visit: Payer: 59 | Admitting: Physician Assistant

## 2017-04-01 VITALS — BP 110/70 | HR 108 | Temp 98.2°F | Resp 16 | Ht 61.0 in | Wt 275.0 lb

## 2017-04-01 DIAGNOSIS — R062 Wheezing: Secondary | ICD-10-CM | POA: Diagnosis not present

## 2017-04-01 MED ORDER — IPRATROPIUM-ALBUTEROL 0.5-2.5 (3) MG/3ML IN SOLN: 3.0000 mL | Freq: Once | RESPIRATORY_TRACT | Status: DC

## 2017-04-01 MED ORDER — FLUTICASONE PROPIONATE 50 MCG/ACT NA SUSP: 2.0000 | Freq: Every day | NASAL | 0 refills | Status: DC

## 2017-04-01 MED ORDER — BENZONATATE 100 MG PO CAPS: 100.0000 mg | ORAL_CAPSULE | Freq: Two times a day (BID) | ORAL | 0 refills | Status: DC | PRN

## 2017-04-01 NOTE — Telephone Encounter (Signed)
emailed forms back to pt as requested.

## 2017-04-01 NOTE — Telephone Encounter (Signed)
Completed forms on 11/20 and placed in bin

## 2017-04-01 NOTE — Progress Notes (Signed)
Patient presents to clinic today c/o several days of chest tightness with slight wheezing and cough that is dry. Denies fever, chills. Notes some mild PND with nasal congestion. Notes some mild chest tenderness 2/2 coughing. Patient is currently on 30 mg prednisone daily for costochondritis flare. Is on a taper weaning down every few days. Denies recent travel or sick contact.  Past Medical History:  Diagnosis Date  . Anemia   . Arthritis   . Costochondritis   . Diverticulitis   . Dysmenorrhea   . GERD (gastroesophageal reflux disease)   . Headache    Migraines  . History of hiatal hernia   . Hypertension   . Menorrhagia   . Psoriatic arthritis ([place])   . Smoker     Current Outpatient Medications on File Prior to Visit  Medication Sig Dispense Refill  . albuterol (PROVENTIL HFA;VENTOLIN HFA) 108 (90 Base) MCG/ACT inhaler Inhale 2 puffs into the lungs every 6 (six) hours as needed for wheezing or shortness of breath. 1 Inhaler 2  . diclofenac sodium (VOLTAREN) 1 % GEL Apply 4 g topically 4 (four) times daily. (Patient taking differently: Apply 4 g topically 4 (four) times daily as needed (for pain). ) 100 g 1  . esomeprazole (NEXIUM) 40 MG capsule TAKE 1 CAPSULE(40 MG) BY MOUTH DAILY BEFORE BREAKFAST (Patient taking differently: Take 40 mg by mouth once a day) 30 capsule 3  . hydrochlorothiazide (HYDRODIURIL) 25 MG tablet TAKE 1 TABLET BY MOUTH EVERY DAY (Patient taking differently: Take 25 mg by mouth once a day) 30 tablet 3  . HYDROcodone-acetaminophen (NORCO/VICODIN) 5-325 MG tablet Take 2 tablets by mouth every 4 (four) hours as needed. 8 tablet 0  . ibuprofen (ADVIL,MOTRIN) 800 MG tablet Take 800 mg by mouth every 8 (eight) hours as needed for mild pain or moderate pain.    Marland Kitchen lactobacillus acidophilus (BACID) TABS tablet Take 1 tablet by mouth daily.    Marland Kitchen lisinopril (PRINIVIL,ZESTRIL) 10 MG tablet TAKE 1 TABLET BY MOUTH EVERY DAY (Patient taking differently: Take 10 mg by mouth  once a day) 30 tablet 3  . Multiple Vitamin (MULTIVITAMIN WITH MINERALS) TABS tablet Take 1 tablet by mouth daily.    . predniSONE (DELTASONE) 5 MG tablet Take 7 mg by mouth daily.    Marland Kitchen QVAR REDIHALER 40 MCG/ACT inhaler INHALE 2 PUFFS INTO THE LUNGS TWICE DAILY 10.6 g 0  . Secukinumab (COSENTYX 300 DOSE) 150 MG/ML SOSY Inject 300 mg into the skin every 28 (twenty-eight) days.     Marland Kitchen XIIDRA 5 % SOLN Instill 1 drop into both eyes two times a day  12  . traMADol (ULTRAM) 50 MG tablet Take 1 tablet (50 mg total) by mouth every 6 (six) hours as needed. (Patient not taking: Reported on 04/01/2017) 15 tablet 0   No current facility-administered medications on file prior to visit.     Allergies  Allergen Reactions  . Aspirin Other (See Comments)    Convulsions   . Toradol [Ketorolac Tromethamine] Nausea And Vomiting  . Adhesive [Tape] Itching and Rash    Family History  Problem Relation Age of Onset  . Brain cancer Father   . Prostate cancer Father   . Hyperlipidemia Mother   . Colon cancer Maternal Grandmother 33  . Stroke Paternal Grandfather        COD  . Aneurysm Maternal Grandfather     Social History   Socioeconomic History  . Marital status: Married    Spouse name: None  .  Number of children: 0  . Years of education: None  . Highest education level: None  Social Needs  . Financial resource strain: None  . Food insecurity - worry: None  . Food insecurity - inability: None  . Transportation needs - medical: None  . Transportation needs - non-medical: None  Occupational History  . Occupation: [employer], [place]  Tobacco Use  . Smoking status: Current Every Day Smoker    Packs/day: 0.10    Years: 20.00    Pack years: 2.00    Types: Cigarettes  . Smokeless tobacco: Never Used  . Tobacco comment: smoking 5 cigarettes a day, hasn't quit completely   Substance and Sexual Activity  . Alcohol use: Yes    Alcohol/week: 9.6 oz    Types: 6 Glasses of wine, 10  Standard drinks or equivalent per week    Comment: socially, weekends 4-5  . Drug use: No  . Sexual activity: Yes    Partners: Male    Birth control/protection: None  Other Topics Concern  . None  Social History Narrative  . None   Review of Systems - See HPI.  All other ROS are negative.  BP 110/70   Pulse (!) 108   Temp 98.2 F (36.8 C) (Oral)   Resp 16   Ht 5' 1" (1.549 m)   Wt 275 lb (124.7 kg)   LMP 02/28/2016   SpO2 98%   BMI 51.96 kg/m   Physical Exam  Constitutional: She is well-developed, well-nourished, and in no distress.  HENT:  Head: Normocephalic and atraumatic.  Eyes: Conjunctivae are normal.  Neck: Neck supple.  Cardiovascular: Normal rate, regular rhythm, normal heart sounds and intact distal pulses.  Pulmonary/Chest: Effort normal and breath sounds normal. No respiratory distress. She has no wheezes. She has no rales. She exhibits no tenderness.  Skin: Skin is warm and dry. No rash noted.  Psychiatric: Affect normal.    Recent Results (from the past 2160 hour(s))  Basic metabolic panel     Status: Abnormal   Collection Time: 02/11/17  3:22 PM  Result Value Ref Range   Sodium 136 135 - 145 mmol/L   Potassium 3.8 3.5 - 5.1 mmol/L   Chloride 97 (L) 101 - 111 mmol/L   CO2 25 22 - 32 mmol/L   Glucose, Bld 100 (H) 65 - 99 mg/dL   BUN 15 6 - 20 mg/dL   Creatinine, Ser 0.67 0.44 - 1.00 mg/dL   Calcium 9.8 8.9 - 10.3 mg/dL   GFR calc non Af Amer >60 >60 mL/min   GFR calc Af Amer >60 >60 mL/min    Comment: (NOTE) The eGFR has been calculated using the CKD EPI equation. This calculation has not been validated in all clinical situations. eGFR's persistently <60 mL/min signify possible Chronic Kidney Disease.    Anion gap 14 5 - 15  CBC     Status: Abnormal   Collection Time: 02/11/17  3:22 PM  Result Value Ref Range   WBC 16.0 (H) 4.0 - 10.5 K/uL   RBC 4.25 3.87 - 5.11 MIL/uL   Hemoglobin 12.7 12.0 - 15.0 g/dL   HCT 40.4 36.0 - 46.0 %   MCV 95.1  78.0 - 100.0 fL   MCH 29.9 26.0 - 34.0 pg   MCHC 31.4 30.0 - 36.0 g/dL   RDW 14.3 11.5 - 15.5 %   Platelets 334 150 - 400 K/uL  D-dimer, quantitative (not at [facility])     Status: Abnormal  Collection Time: 02/11/17  3:22 PM  Result Value Ref Range   D-Dimer, Quant 0.97 (H) 0.00 - 0.50 ug/mL-FEU    Comment: (NOTE) At the manufacturer cut-off of 0.50 ug/mL FEU, this assay has been documented to exclude PE with a sensitivity and negative predictive value of 97 to 99%.  At this time, this assay has not been approved by the FDA to exclude DVT/VTE. Results should be correlated with clinical presentation.   I-stat troponin, ED     Status: None   Collection Time: 02/11/17  4:01 PM  Result Value Ref Range   Troponin i, poc 0.00 0.00 - 0.08 ng/mL   Comment 3            Comment: Due to the release kinetics of cTnI, a negative result within the first hours of the onset of symptoms does not rule out myocardial infarction with certainty. If myocardial infarction is still suspected, repeat the test at appropriate intervals.   Troponin I     Status: None   Collection Time: 02/11/17 10:04 PM  Result Value Ref Range   Troponin I <0.03 <0.03 ng/mL  CBC with Differential/Platelet     Status: Abnormal   Collection Time: 03/04/17 10:38 AM  Result Value Ref Range   WBC 10.8 (H) 4.0 - 10.5 K/uL   RBC 3.59 (L) 3.87 - 5.11 Mil/uL   Hemoglobin 11.3 (L) 12.0 - 15.0 g/dL   HCT 34.7 (L) 36.0 - 46.0 %   MCV 96.7 78.0 - 100.0 fl   MCHC 32.6 30.0 - 36.0 g/dL   RDW 15.1 11.5 - 15.5 %   Platelets 261.0 150.0 - 400.0 K/uL   Neutrophils Relative % 68.8 43.0 - 77.0 %   Lymphocytes Relative 24.4 12.0 - 46.0 %   Monocytes Relative 4.7 3.0 - 12.0 %   Eosinophils Relative 1.4 0.0 - 5.0 %   Basophils Relative 0.7 0.0 - 3.0 %   Neutro Abs 7.5 1.4 - 7.7 K/uL   Lymphs Abs 2.6 0.7 - 4.0 K/uL   Monocytes Absolute 0.5 0.1 - 1.0 K/uL   Eosinophils Absolute 0.2 0.0 - 0.7 K/uL   Basophils Absolute 0.1 0.0 - 0.1 K/uL    Comprehensive metabolic panel     Status: Abnormal   Collection Time: 03/05/17  5:41 PM  Result Value Ref Range   Sodium 134 (L) 135 - 145 mmol/L   Potassium 3.9 3.5 - 5.1 mmol/L    Comment: SLIGHT HEMOLYSIS   Chloride 97 (L) 101 - 111 mmol/L   CO2 26 22 - 32 mmol/L   Glucose, Bld 110 (H) 65 - 99 mg/dL   BUN 13 6 - 20 mg/dL   Creatinine, Ser 0.63 0.44 - 1.00 mg/dL   Calcium 9.1 8.9 - 10.3 mg/dL   Total Protein 6.7 6.5 - 8.1 g/dL   Albumin 3.5 3.5 - 5.0 g/dL   AST 35 15 - 41 U/L   ALT 43 14 - 54 U/L   Alkaline Phosphatase 57 38 - 126 U/L   Total Bilirubin 0.6 0.3 - 1.2 mg/dL   GFR calc non Af Amer >60 >60 mL/min   GFR calc Af Amer >60 >60 mL/min    Comment: (NOTE) The eGFR has been calculated using the CKD EPI equation. This calculation has not been validated in all clinical situations. eGFR's persistently <60 mL/min signify possible Chronic Kidney Disease.    Anion gap 11 5 - 15  CBC     Status: Abnormal   Collection Time:  03/05/17  5:41 PM  Result Value Ref Range   WBC 11.0 (H) 4.0 - 10.5 K/uL   RBC 3.58 (L) 3.87 - 5.11 MIL/uL   Hemoglobin 11.1 (L) 12.0 - 15.0 g/dL   HCT 35.0 (L) 36.0 - 46.0 %   MCV 97.8 78.0 - 100.0 fL   MCH 31.0 26.0 - 34.0 pg   MCHC 31.7 30.0 - 36.0 g/dL   RDW 15.3 11.5 - 15.5 %   Platelets 240 150 - 400 K/uL  Type and screen [facility]     Status: None   Collection Time: 03/05/17  6:55 PM  Result Value Ref Range   ABO/RH(D) A POS    Antibody Screen NEG    Sample Expiration 03/08/2017     Assessment/Plan: 1. Wheezing With allergic rhinitis. Start Flonase and [employer]. Duoneb given in office with improvement. Resume maintenance and rescue inhaler. Supportive measures and OTC medications reviewed.  - ipratropium-albuterol (DUONEB) 0.5-2.5 (3) MG/3ML nebulizer solution 3 mL   Leeanne Rio, PA-C

## 2017-04-01 NOTE — Progress Notes (Signed)
Pre visit review using our clinic review tool, if applicable. No additional management support is needed unless otherwise documented below in the visit note. 

## 2017-04-01 NOTE — Patient Instructions (Signed)
Please keep hydrated and get plenty of rest. Use plain Mucinex to help with congestions. I am sending in a cough medication and a nasal spray to use as directed. Continue inhaler regimen. Continue prednisone for 2 more days, then resume tapering down per specialist.   Follow-up if not resolving.

## 2017-04-08 ENCOUNTER — Telehealth: Payer: Self-pay

## 2017-04-08 NOTE — Telephone Encounter (Signed)
Pt Left a VM on 04/07/2017 asking if she needed someone to drive her to her Hemorrhoid banding procedure this Friday.   Spoke with Pt on 04/06/17 around 8:20 AM, Pt notified that Banding procedure is a simple procedure that requires topical Lidocaine and no general anesthesia. Pt is ok to drive her self after procedure if wanted.

## 2017-04-09 ENCOUNTER — Other Ambulatory Visit: Payer: Self-pay | Admitting: Family Medicine

## 2017-04-09 NOTE — Telephone Encounter (Signed)
Last OV 04/01/17 (cody- wheeze) Nystatin last filled 10/03/16 4106mL with 0

## 2017-04-10 ENCOUNTER — Encounter: Payer: Self-pay | Admitting: Internal Medicine

## 2017-04-10 ENCOUNTER — Ambulatory Visit (INDEPENDENT_AMBULATORY_CARE_PROVIDER_SITE_OTHER): Payer: 59 | Admitting: Internal Medicine

## 2017-04-10 ENCOUNTER — Ambulatory Visit: Payer: 59 | Admitting: Internal Medicine

## 2017-04-10 VITALS — BP 140/87 | HR 99 | Temp 97.1°F | Ht 62.0 in | Wt 277.2 lb

## 2017-04-10 DIAGNOSIS — K648 Other hemorrhoids: Secondary | ICD-10-CM | POA: Diagnosis not present

## 2017-04-10 NOTE — Patient Instructions (Signed)
Avoid straining.  Continue Metamucil daily.  Limit toilet time to 5 minutes  Call with any interim problems  Schedule followup appointment in 4 weeks from now

## 2017-04-10 NOTE — Progress Notes (Signed)
[place] banding procedure note:  The patient presents with symptomatic  hemorrhoids, unresponsive to maximal medical therapy, requesting rubber band ligation of her hemorrhoidal disease. All risks, benefits, and alternative forms of therapy were described and informed consent was obtained.  In the left lateral decubitus position, a DRE utilizing nitroglycerin and Xylocaine topically used as lubricant. No abnormalities on DRE. Endoscopy performed which revealed a prominent left and right hemorrhoid columns.  The decision was made to band the right anterior internal hemorrhoid;  the [place] was used to perform latex free band ligation without complication. Digital anorectal examination was then performed to assure proper positioning of the band;  Band found to be in excellent position. No pinching or pain; I elected to place a second band in the left lateral orientation. This was done without difficulty. Follow-up DRE revealed good position without pinching or pain.  The patient was discharged home without pain or other issues. Dietary and behavioral recommendations were given  along with follow-up instructions. The patient will return in 4 weeks for followup and possible additional banding as required.  No complications were encountered and the patient tolerated the procedure well.

## 2017-04-13 ENCOUNTER — Telehealth: Payer: Self-pay | Admitting: Family Medicine

## 2017-04-13 ENCOUNTER — Other Ambulatory Visit: Payer: Self-pay | Admitting: Family Medicine

## 2017-04-13 NOTE — Telephone Encounter (Signed)
Please advise, this has been denied 2 times by you

## 2017-04-13 NOTE — Telephone Encounter (Signed)
Ok to refill x1 but if no improvement will need appt as we need to make sure we are treating appropriately

## 2017-04-13 NOTE — Telephone Encounter (Signed)
Pt is requesting Nystatin for thrush. Stated that is coming from the Prednisone.

## 2017-04-13 NOTE — Telephone Encounter (Signed)
Copied from [place]. Topic: Quick Communication - See Telephone Encounter >> Apr 13, 2017  8:48 AM Bea Graff, NT wrote: CRM for notification. See Telephone encounter for: Patient calling to see if she can get an rx mystatin for thrush in her mouth called into [facility] in [place].   04/13/17.

## 2017-04-14 MED ORDER — NYSTATIN 100000 UNIT/ML MT SUSP: OROMUCOSAL | 0 refills | Status: DC

## 2017-04-14 NOTE — Addendum Note (Signed)
Addended by: Davis Gourd on: 04/14/2017 08:28 AM   Modules accepted: Orders

## 2017-04-15 DIAGNOSIS — Z720 Tobacco use: Secondary | ICD-10-CM | POA: Diagnosis not present

## 2017-04-17 DIAGNOSIS — L4050 Arthropathic psoriasis, unspecified: Secondary | ICD-10-CM | POA: Diagnosis not present

## 2017-04-17 DIAGNOSIS — M940 Chondrocostal junction syndrome [Tietze]: Secondary | ICD-10-CM | POA: Diagnosis not present

## 2017-04-21 ENCOUNTER — Other Ambulatory Visit: Payer: Self-pay | Admitting: Family Medicine

## 2017-04-22 ENCOUNTER — Ambulatory Visit: Payer: 59 | Admitting: Gastroenterology

## 2017-04-23 ENCOUNTER — Ambulatory Visit: Payer: 59 | Admitting: Gastroenterology

## 2017-04-24 ENCOUNTER — Encounter: Payer: Self-pay | Admitting: Physician Assistant

## 2017-04-24 ENCOUNTER — Ambulatory Visit: Payer: 59 | Admitting: Physician Assistant

## 2017-04-24 VITALS — BP 110/70 | HR 102 | Temp 98.1°F | Resp 16 | Ht 62.0 in | Wt 277.0 lb

## 2017-04-24 DIAGNOSIS — Z716 Tobacco abuse counseling: Secondary | ICD-10-CM

## 2017-04-24 MED ORDER — VARENICLINE TARTRATE 0.5 MG X 11 & 1 MG X 42 PO MISC: ORAL | 0 refills | Status: DC

## 2017-04-24 NOTE — Patient Instructions (Signed)
Please stay well-hydrated and get plenty of rest. Start the Chantix pack as directed. Follow-up with Dr. Birdie Riddle in 4 weeks.  If you note any side effects of medication as discussed, please call me.

## 2017-04-24 NOTE — Progress Notes (Signed)
Patient presents to clinic today to discuss options for smoking cessation. Patient with a >20 pack-year smoking history, currently only smoking about 3-4 cigarettes per day. States she quit for a short period of time before with acupuncture but this was not lasting. Has tried the patches before without improvement.  Past Medical History:  Diagnosis Date  . Anemia   . Arthritis   . Costochondritis   . Diverticulitis   . Dysmenorrhea   . GERD (gastroesophageal reflux disease)   . Headache    Migraines  . History of hiatal hernia   . Hypertension   . Menorrhagia   . Psoriatic arthritis ([place])   . Smoker     Current Outpatient Medications on File Prior to Visit  Medication Sig Dispense Refill  . albuterol (PROVENTIL HFA;VENTOLIN HFA) 108 (90 Base) MCG/ACT inhaler Inhale 2 puffs into the lungs every 6 (six) hours as needed for wheezing or shortness of breath. 1 Inhaler 2  . benzonatate (TESSALON) 100 MG capsule Take 1 capsule (100 mg total) by mouth 2 (two) times daily as needed for cough. 20 capsule 0  . diclofenac sodium (VOLTAREN) 1 % GEL Apply 4 g topically 4 (four) times daily. (Patient taking differently: Apply 4 g topically 4 (four) times daily as needed (for pain). ) 100 g 1  . esomeprazole (NEXIUM) 40 MG capsule TAKE 1 CAPSULE(40 MG) BY MOUTH DAILY BEFORE BREAKFAST (Patient taking differently: Take 40 mg by mouth once a day) 30 capsule 3  . fluticasone (FLONASE) 50 MCG/ACT nasal spray Place 2 sprays into both nostrils daily. 16 g 0  . hydrochlorothiazide (HYDRODIURIL) 25 MG tablet TAKE 1 TABLET BY MOUTH EVERY DAY 30 tablet 0  . HYDROcodone-acetaminophen (NORCO/VICODIN) 5-325 MG tablet Take 2 tablets by mouth every 4 (four) hours as needed. 8 tablet 0  . hydrocortisone (ANUSOL-HC) 2.5 % rectal cream Place 1 application rectally as needed for hemorrhoids or anal itching.    Marland Kitchen ibuprofen (ADVIL,MOTRIN) 800 MG tablet Take 800 mg by mouth every 8 (eight) hours as needed for mild pain or  moderate pain.    Marland Kitchen lactobacillus acidophilus (BACID) TABS tablet Take 1 tablet by mouth daily.    Marland Kitchen lisinopril (PRINIVIL,ZESTRIL) 10 MG tablet TAKE 1 TABLET BY MOUTH EVERY DAY (Patient taking differently: Take 10 mg by mouth once a day) 30 tablet 3  . Multiple Vitamin (MULTIVITAMIN WITH MINERALS) TABS tablet Take 1 tablet by mouth daily.    Marland Kitchen nystatin (MYCOSTATIN) 100000 UNIT/ML suspension SWISH AND SPIT 5 ML BY MOUTH FOUR TIMES DAILY AS NEEDED FOR THRUSH 473 mL 0  . predniSONE (DELTASONE) 5 MG tablet Take 20 mg by mouth daily.     Marland Kitchen QVAR REDIHALER 40 MCG/ACT inhaler INHALE 2 PUFFS INTO THE LUNGS TWICE DAILY (Patient taking differently: INHALE 2 PUFFS INTO THE LUNGS TWICE DAILY as needed) 10.6 g 0  . Secukinumab (COSENTYX 300 DOSE) 150 MG/ML SOSY Inject 300 mg into the skin every 28 (twenty-eight) days.     . traMADol (ULTRAM) 50 MG tablet Take 1 tablet (50 mg total) by mouth every 6 (six) hours as needed. 15 tablet 0  . XIIDRA 5 % SOLN Instill 1 drop into both eyes two times a day  12   Current Facility-Administered Medications on File Prior to Visit  Medication Dose Route Frequency Provider Last Rate Last Dose  . ipratropium-albuterol (DUONEB) 0.5-2.5 (3) MG/3ML nebulizer solution 3 mL  3 mL Nebulization Once Brunetta Jeans, PA-C  Allergies  Allergen Reactions  . Aspirin Other (See Comments)    Convulsions   . Toradol [Ketorolac Tromethamine] Nausea And Vomiting  . Adhesive [Tape] Itching and Rash    Family History  Problem Relation Age of Onset  . Brain cancer Father   . Prostate cancer Father   . Hyperlipidemia Mother   . Colon cancer Maternal Grandmother 25  . Stroke Paternal Grandfather        COD  . Aneurysm Maternal Grandfather     Social History   Socioeconomic History  . Marital status: Married    Spouse name: None  . Number of children: 0  . Years of education: None  . Highest education level: None  Social Needs  . Financial resource strain: None  .  Food insecurity - worry: None  . Food insecurity - inability: None  . Transportation needs - medical: None  . Transportation needs - non-medical: None  Occupational History  . Occupation: [employer], [place]  Tobacco Use  . Smoking status: Current Every Day Smoker    Packs/day: 0.10    Years: 20.00    Pack years: 2.00    Types: Cigarettes  . Smokeless tobacco: Never Used  . Tobacco comment: smoking 5 cigarettes a day, hasn't quit completely   Substance and Sexual Activity  . Alcohol use: Yes    Alcohol/week: 9.6 oz    Types: 6 Glasses of wine, 10 Standard drinks or equivalent per week    Comment: socially, weekends 4-5  . Drug use: No  . Sexual activity: Yes    Partners: Male    Birth control/protection: None  Other Topics Concern  . None  Social History Narrative  . None   Review of Systems - See HPI.  All other ROS are negative.  BP 110/70   Pulse (!) 102   Temp 98.1 F (36.7 C) (Oral)   Resp 16   Ht '5\' 2"'  (1.575 m)   Wt 277 lb (125.6 kg)   LMP 02/28/2016   SpO2 95%   BMI 50.66 kg/m   Physical Exam  Constitutional: She is oriented to person, place, and time and well-developed, well-nourished, and in no distress.  HENT:  Head: Normocephalic and atraumatic.  Eyes: Conjunctivae are normal.  Cardiovascular: Normal rate, regular rhythm, normal heart sounds and intact distal pulses.  Pulmonary/Chest: Effort normal and breath sounds normal. No respiratory distress. She has no wheezes. She has no rales. She exhibits no tenderness.  Neurological: She is alert and oriented to person, place, and time.  Skin: Skin is warm and dry. No rash noted.  Psychiatric: Affect normal.  Vitals reviewed.   Recent Results (from the past 2160 hour(s))  Basic metabolic panel     Status: Abnormal   Collection Time: 02/11/17  3:22 PM  Result Value Ref Range   Sodium 136 135 - 145 mmol/L   Potassium 3.8 3.5 - 5.1 mmol/L   Chloride 97 (L) 101 - 111 mmol/L   CO2 25 22 - 32  mmol/L   Glucose, Bld 100 (H) 65 - 99 mg/dL   BUN 15 6 - 20 mg/dL   Creatinine, Ser 0.67 0.44 - 1.00 mg/dL   Calcium 9.8 8.9 - 10.3 mg/dL   GFR calc non Af Amer >60 >60 mL/min   GFR calc Af Amer >60 >60 mL/min    Comment: (NOTE) The eGFR has been calculated using the CKD EPI equation. This calculation has not been validated in all clinical situations. eGFR's persistently <  60 mL/min signify possible Chronic Kidney Disease.    Anion gap 14 5 - 15  CBC     Status: Abnormal   Collection Time: 02/11/17  3:22 PM  Result Value Ref Range   WBC 16.0 (H) 4.0 - 10.5 K/uL   RBC 4.25 3.87 - 5.11 MIL/uL   Hemoglobin 12.7 12.0 - 15.0 g/dL   HCT 40.4 36.0 - 46.0 %   MCV 95.1 78.0 - 100.0 fL   MCH 29.9 26.0 - 34.0 pg   MCHC 31.4 30.0 - 36.0 g/dL   RDW 14.3 11.5 - 15.5 %   Platelets 334 150 - 400 K/uL  D-dimer, quantitative (not at [facility])     Status: Abnormal   Collection Time: 02/11/17  3:22 PM  Result Value Ref Range   D-Dimer, Quant 0.97 (H) 0.00 - 0.50 ug/mL-FEU    Comment: (NOTE) At the manufacturer cut-off of 0.50 ug/mL FEU, this assay has been documented to exclude PE with a sensitivity and negative predictive value of 97 to 99%.  At this time, this assay has not been approved by the FDA to exclude DVT/VTE. Results should be correlated with clinical presentation.   I-stat troponin, ED     Status: None   Collection Time: 02/11/17  4:01 PM  Result Value Ref Range   Troponin i, poc 0.00 0.00 - 0.08 ng/mL   Comment 3            Comment: Due to the release kinetics of cTnI, a negative result within the first hours of the onset of symptoms does not rule out myocardial infarction with certainty. If myocardial infarction is still suspected, repeat the test at appropriate intervals.   Troponin I     Status: None   Collection Time: 02/11/17 10:04 PM  Result Value Ref Range   Troponin I <0.03 <0.03 ng/mL  CBC with Differential/Platelet     Status: Abnormal   Collection Time: 03/04/17  10:38 AM  Result Value Ref Range   WBC 10.8 (H) 4.0 - 10.5 K/uL   RBC 3.59 (L) 3.87 - 5.11 Mil/uL   Hemoglobin 11.3 (L) 12.0 - 15.0 g/dL   HCT 34.7 (L) 36.0 - 46.0 %   MCV 96.7 78.0 - 100.0 fl   MCHC 32.6 30.0 - 36.0 g/dL   RDW 15.1 11.5 - 15.5 %   Platelets 261.0 150.0 - 400.0 K/uL   Neutrophils Relative % 68.8 43.0 - 77.0 %   Lymphocytes Relative 24.4 12.0 - 46.0 %   Monocytes Relative 4.7 3.0 - 12.0 %   Eosinophils Relative 1.4 0.0 - 5.0 %   Basophils Relative 0.7 0.0 - 3.0 %   Neutro Abs 7.5 1.4 - 7.7 K/uL   Lymphs Abs 2.6 0.7 - 4.0 K/uL   Monocytes Absolute 0.5 0.1 - 1.0 K/uL   Eosinophils Absolute 0.2 0.0 - 0.7 K/uL   Basophils Absolute 0.1 0.0 - 0.1 K/uL  Comprehensive metabolic panel     Status: Abnormal   Collection Time: 03/05/17  5:41 PM  Result Value Ref Range   Sodium 134 (L) 135 - 145 mmol/L   Potassium 3.9 3.5 - 5.1 mmol/L    Comment: SLIGHT HEMOLYSIS   Chloride 97 (L) 101 - 111 mmol/L   CO2 26 22 - 32 mmol/L   Glucose, Bld 110 (H) 65 - 99 mg/dL   BUN 13 6 - 20 mg/dL   Creatinine, Ser 0.63 0.44 - 1.00 mg/dL   Calcium 9.1 8.9 - 10.3 mg/dL  Total Protein 6.7 6.5 - 8.1 g/dL   Albumin 3.5 3.5 - 5.0 g/dL   AST 35 15 - 41 U/L   ALT 43 14 - 54 U/L   Alkaline Phosphatase 57 38 - 126 U/L   Total Bilirubin 0.6 0.3 - 1.2 mg/dL   GFR calc non Af Amer >60 >60 mL/min   GFR calc Af Amer >60 >60 mL/min    Comment: (NOTE) The eGFR has been calculated using the CKD EPI equation. This calculation has not been validated in all clinical situations. eGFR's persistently <60 mL/min signify possible Chronic Kidney Disease.    Anion gap 11 5 - 15  CBC     Status: Abnormal   Collection Time: 03/05/17  5:41 PM  Result Value Ref Range   WBC 11.0 (H) 4.0 - 10.5 K/uL   RBC 3.58 (L) 3.87 - 5.11 MIL/uL   Hemoglobin 11.1 (L) 12.0 - 15.0 g/dL   HCT 35.0 (L) 36.0 - 46.0 %   MCV 97.8 78.0 - 100.0 fL   MCH 31.0 26.0 - 34.0 pg   MCHC 31.7 30.0 - 36.0 g/dL   RDW 15.3 11.5 - 15.5 %    Platelets 240 150 - 400 K/uL  Type and screen [facility]     Status: None   Collection Time: 03/05/17  6:55 PM  Result Value Ref Range   ABO/RH(D) A POS    Antibody Screen NEG    Sample Expiration 03/08/2017    Assessment/Plan: 1. Encounter for tobacco use cessation counseling Discussed readiness to quit. She seems ready and able. Discussed barriers to cessation and options for OTC and Rx smoking cessation aids. After discussion, patient would like to try Chantix. Discussed potential side effects of medication. Will start Chantix starting pack. Follow-up in 3-4 weeks with PCP for reassessment.    Leeanne Rio, PA-C

## 2017-04-29 ENCOUNTER — Other Ambulatory Visit (HOSPITAL_COMMUNITY): Payer: Self-pay | Admitting: General Surgery

## 2017-04-30 ENCOUNTER — Other Ambulatory Visit: Payer: Self-pay | Admitting: Family Medicine

## 2017-04-30 ENCOUNTER — Other Ambulatory Visit: Payer: Self-pay | Admitting: Physician Assistant

## 2017-05-14 ENCOUNTER — Encounter: Payer: Self-pay | Admitting: Family Medicine

## 2017-05-14 ENCOUNTER — Other Ambulatory Visit: Payer: Self-pay

## 2017-05-14 ENCOUNTER — Other Ambulatory Visit: Payer: Self-pay | Admitting: Family Medicine

## 2017-05-14 ENCOUNTER — Ambulatory Visit: Payer: 59 | Admitting: Family Medicine

## 2017-05-14 VITALS — BP 114/86 | HR 118 | Temp 98.6°F | Resp 18 | Ht 62.0 in | Wt 275.1 lb

## 2017-05-14 DIAGNOSIS — J329 Chronic sinusitis, unspecified: Secondary | ICD-10-CM

## 2017-05-14 DIAGNOSIS — B9689 Other specified bacterial agents as the cause of diseases classified elsewhere: Secondary | ICD-10-CM | POA: Diagnosis not present

## 2017-05-14 DIAGNOSIS — Z1231 Encounter for screening mammogram for malignant neoplasm of breast: Secondary | ICD-10-CM

## 2017-05-14 MED ORDER — AMOXICILLIN 875 MG PO TABS: 875.0000 mg | ORAL_TABLET | Freq: Two times a day (BID) | ORAL | 0 refills | Status: DC

## 2017-05-14 MED ORDER — PROMETHAZINE-DM 6.25-15 MG/5ML PO SYRP: 5.0000 mL | ORAL_SOLUTION | Freq: Four times a day (QID) | ORAL | 0 refills | Status: DC | PRN

## 2017-05-14 NOTE — Progress Notes (Signed)
   Subjective:    Patient ID: Dawn Hayes, female    DOB: [birth year], 47 y.o.   MRN: [id number]  HPI Cough- 'i've had this cough since the last time I was here' (04/24/17)  Cough is intermittently productive.  On Monday ears were full and she started sneezing.  No fevers.  Only using Qvar as needed 'when cough is bad'.  Using Albuterol 2-3x/day 'at least'.  No known sick contacts.  + wheezing.  Mild SOB.  + maxillary sinus pain   Review of Systems For ROS see HPI     Objective:   Physical Exam  Constitutional: She appears well-developed and well-nourished. No distress.  HENT:  Head: Normocephalic and atraumatic.  Right Ear: Tympanic membrane normal.  Left Ear: Tympanic membrane normal.  Nose: Mucosal edema and rhinorrhea present. Right sinus exhibits maxillary sinus tenderness. Right sinus exhibits no frontal sinus tenderness. Left sinus exhibits maxillary sinus tenderness. Left sinus exhibits no frontal sinus tenderness.  Mouth/Throat: Uvula is midline and mucous membranes are normal. Posterior oropharyngeal erythema present. No oropharyngeal exudate.  Eyes: Conjunctivae and EOM are normal. Pupils are equal, round, and reactive to light.  Neck: Normal range of motion. Neck supple.  Cardiovascular: Normal rate, regular rhythm and normal heart sounds.  Pulmonary/Chest: Effort normal and breath sounds normal. No respiratory distress. She has no wheezes.  Lymphadenopathy:    She has no cervical adenopathy.  Vitals reviewed.         Assessment & Plan:

## 2017-05-14 NOTE — Patient Instructions (Addendum)
Follow up as needed or as scheduled Start the Amoxicillin twice daily- take w/ food- for the sinus infection Drink plenty of fluids REST! Start the Qvar inhaler- 2 puffs twice daily until feeling better Use the albuterol as needed for cough/wheezing Use the cough syrup as needed- may cause drowsiness Call with any questions or concerns Happy New Year!!!

## 2017-05-18 DIAGNOSIS — L4050 Arthropathic psoriasis, unspecified: Secondary | ICD-10-CM | POA: Diagnosis not present

## 2017-05-21 ENCOUNTER — Other Ambulatory Visit: Payer: Self-pay | Admitting: Family Medicine

## 2017-05-22 ENCOUNTER — Ambulatory Visit (HOSPITAL_COMMUNITY)
Admission: RE | Admit: 2017-05-22 | Discharge: 2017-05-22 | Disposition: A | Discharge status: Home / Self Care | Payer: 59 | Source: Ambulatory Visit | Attending: General Surgery | Admitting: General Surgery

## 2017-05-22 ENCOUNTER — Other Ambulatory Visit: Payer: Self-pay

## 2017-05-22 DIAGNOSIS — M1990 Unspecified osteoarthritis, unspecified site: Secondary | ICD-10-CM | POA: Insufficient documentation

## 2017-05-22 DIAGNOSIS — I10 Essential (primary) hypertension: Secondary | ICD-10-CM | POA: Diagnosis not present

## 2017-05-22 DIAGNOSIS — Z01818 Encounter for other preprocedural examination: Secondary | ICD-10-CM | POA: Insufficient documentation

## 2017-05-22 DIAGNOSIS — Z9049 Acquired absence of other specified parts of digestive tract: Secondary | ICD-10-CM | POA: Diagnosis not present

## 2017-05-22 DIAGNOSIS — Z79899 Other long term (current) drug therapy: Secondary | ICD-10-CM | POA: Diagnosis not present

## 2017-05-22 DIAGNOSIS — Z6841 Body Mass Index (BMI) 40.0 and over, adult: Secondary | ICD-10-CM | POA: Diagnosis not present

## 2017-05-22 DIAGNOSIS — F17200 Nicotine dependence, unspecified, uncomplicated: Secondary | ICD-10-CM | POA: Insufficient documentation

## 2017-05-22 DIAGNOSIS — Z7952 Long term (current) use of systemic steroids: Secondary | ICD-10-CM | POA: Insufficient documentation

## 2017-05-22 DIAGNOSIS — Z886 Allergy status to analgesic agent status: Secondary | ICD-10-CM | POA: Diagnosis not present

## 2017-05-22 DIAGNOSIS — Z90710 Acquired absence of both cervix and uterus: Secondary | ICD-10-CM | POA: Diagnosis not present

## 2017-05-22 DIAGNOSIS — K219 Gastro-esophageal reflux disease without esophagitis: Secondary | ICD-10-CM | POA: Diagnosis not present

## 2017-05-22 DIAGNOSIS — R918 Other nonspecific abnormal finding of lung field: Secondary | ICD-10-CM | POA: Diagnosis not present

## 2017-05-25 ENCOUNTER — Ambulatory Visit: Payer: 59 | Admitting: Skilled Nursing Facility1

## 2017-05-27 ENCOUNTER — Encounter: Payer: 59 | Attending: General Surgery | Admitting: Registered"

## 2017-05-27 ENCOUNTER — Encounter: Payer: Self-pay | Admitting: Registered"

## 2017-05-27 DIAGNOSIS — E669 Obesity, unspecified: Secondary | ICD-10-CM

## 2017-05-27 DIAGNOSIS — Z886 Allergy status to analgesic agent status: Secondary | ICD-10-CM | POA: Insufficient documentation

## 2017-05-27 DIAGNOSIS — Z6841 Body Mass Index (BMI) 40.0 and over, adult: Secondary | ICD-10-CM | POA: Diagnosis not present

## 2017-05-27 DIAGNOSIS — Z79899 Other long term (current) drug therapy: Secondary | ICD-10-CM | POA: Insufficient documentation

## 2017-05-27 DIAGNOSIS — Z713 Dietary counseling and surveillance: Secondary | ICD-10-CM | POA: Insufficient documentation

## 2017-05-27 NOTE — Progress Notes (Signed)
Pre-Op Assessment Visit:  Pre-Operative Sleeve gastrectomy/RYGB Surgery  Medical Nutrition Therapy:  Appt start time: 2:55  End time:  3:50  Patient was seen on 05/27/2017 for Pre-Operative Nutrition Assessment. Assessment and letter of approval faxed to [facility] Bariatric Surgery Program coordinator on 05/27/2017.   Pt expectation of surgery: improve back pain, reduce medications, be healthier, spend more time outside (camping, fishing on lake, hiking), be more comfortable, improve joint pain, improve arthritis, increase exercise ability  Pt expectation of Dietitian: lists of things to eat/not to eat  Start weight at [facility]: 273.5 BMI: 50.84   Pt states she is currently doing the next 56 days diet: free range organic meats, vegetables; uses its own products of protein powder (15g protein, 10g carbohydrates), vitamins, and supplements. Pt states she quit smoking 5 days ago; Chantix is helping.   Per insurance, pt needs 6 SWL visits prior to surgery.    24 hr Dietary Recall: First Meal: shake (15g) Snack: apple Second Meal: tomato soup or sandwich or leftovers Snack: none Third Meal: bbq grilled chicken, green beans, whole grain rice Snack: chips Beverages: unsweetened almond milk, unsweet tea, water, mixed drink (occasionally)  Encouraged to engage in 150 minutes of moderate physical activity including cardiovascular and weight baring weekly  Handouts given during visit include:  . Pre-Op Goals . Bariatric Surgery Protein Shakes . Vitamin and Mineral Recommendations  During the appointment today the following Pre-Op Goals were reviewed with the patient: . Track your food and beverage: MyFitness Pal or Baritastic App . Make healthy food choices . Begin to limit portion sizes . Limited concentrated sugars and fried foods . Keep fat/sugar in the single digits per serving on food labels . Practice CHEWING your food  (aim for 30 chews per bite or until applesauce  consistency) . Practice not drinking 15 minutes before, during, and 30 minutes after each meal/snack . Avoid all carbonated beverages  . Avoid/limit caffeinated beverages  . Avoid all sugar-sweetened beverages . Avoid alcohol . Consume 3 meals per day; eat every 3-5 hours . Make a list of non-food related activities . Aim for 64-100 ounces of FLUID daily  . Aim for at least 60-80 grams of PROTEIN daily . Look for a liquid protein source that contain ?15 g protein and ?5 g carbohydrate  (ex: shakes, drinks, shots) . Physical activity is an important part of a healthy lifestyle so keep it moving!  Follow diet recommendations listed below Energy and Macronutrient Recommendations: Calories: 1800 Carbohydrate: 200 Protein: 135 Fat: 50  Demonstrated degree of understanding via:  Teach Back   Teaching Method Utilized:  Visual Auditory Hands on  Barriers to learning/adherence to lifestyle change: none identified  Patient to call the Nutrition [facility] Education Services to enroll in Pre-Op and Post-Op Nutrition Education when surgery date is scheduled.

## 2017-06-01 DIAGNOSIS — L4050 Arthropathic psoriasis, unspecified: Secondary | ICD-10-CM | POA: Diagnosis not present

## 2017-06-03 ENCOUNTER — Other Ambulatory Visit: Payer: Self-pay | Admitting: Gastroenterology

## 2017-06-03 ENCOUNTER — Encounter: Payer: Self-pay | Admitting: Family Medicine

## 2017-06-03 ENCOUNTER — Other Ambulatory Visit: Payer: Self-pay

## 2017-06-03 ENCOUNTER — Ambulatory Visit: Payer: 59 | Admitting: Family Medicine

## 2017-06-03 VITALS — BP 120/82 | HR 92 | Temp 98.1°F | Resp 16 | Ht 62.0 in | Wt 272.5 lb

## 2017-06-03 DIAGNOSIS — T464X5A Adverse effect of angiotensin-converting-enzyme inhibitors, initial encounter: Secondary | ICD-10-CM | POA: Diagnosis not present

## 2017-06-03 DIAGNOSIS — Z111 Encounter for screening for respiratory tuberculosis: Secondary | ICD-10-CM

## 2017-06-03 DIAGNOSIS — T50905A Adverse effect of unspecified drugs, medicaments and biological substances, initial encounter: Secondary | ICD-10-CM

## 2017-06-03 MED ORDER — METHYLPREDNISOLONE ACETATE 80 MG/ML IJ SUSP
80.0000 mg | Freq: Once | INTRAMUSCULAR | Status: AC
  Administered 2017-06-03: 80 mg via INTRAMUSCULAR

## 2017-06-03 MED ORDER — PREDNISONE 10 MG PO TABS: ORAL_TABLET | ORAL | 0 refills | Status: DC

## 2017-06-03 MED ORDER — HYDROXYZINE HCL 50 MG PO TABS: 50.0000 mg | ORAL_TABLET | Freq: Three times a day (TID) | ORAL | 0 refills | Status: DC | PRN

## 2017-06-03 NOTE — Progress Notes (Signed)
   Subjective:    Patient ID: Dawn Hayes, female    DOB: 06/25/1970, 47 y.o.   MRN: [id number]  HPI 'i'm itching myself to death'- got drug therapy for psoriatic arthritis on Monday and sxs started yesterday afternoon.  Took 3 benadryl last night w/o relief.  Got Cimzia on Monday- this was 2nd injxn.  No hives, no rash to speak of but does have 'red splotches' on legs.  Itching is widespread.  No difficulty breathing or swallowing.   Review of Systems For ROS see HPI     Objective:   Physical Exam  Constitutional: She is oriented to person, place, and time. She appears well-developed and well-nourished. No distress.  Scratching incessantly  HENT:  Head: Normocephalic and atraumatic.  Neurological: She is alert and oriented to person, place, and time.  Skin: Skin is warm and dry. No rash noted. There is erythema (blotchy redness on anterior chest and legs bilaterally w/o discrete rash).  Psychiatric: She has a normal mood and affect. Her behavior is normal.  Vitals reviewed.         Assessment & Plan:  Drug reaction- pt appears to be having a reaction from her 2nd dose of Cimzia.  Encouraged her to contact Rheumatology and make them aware.  Depo-medrol given in office today and pt to start Prednisone taper tomorrow.  Hydroxyzine prn.  Reviewed supportive care and red flags that should prompt return.  Pt expressed understanding and is in agreement w/ plan.   Immunity status testing- pt needs updated TB test in order to volunteer for Hospice.

## 2017-06-03 NOTE — Patient Instructions (Signed)
Follow up as needed or as scheduled We'll notify you of your lab results Start the Prednisone as directed tomorrow Use the hydroxyzine as needed for itching- may cause drowsiness Please notify your Rheumatologist of the drug reaction Call with any questions or concerns Hang in there!

## 2017-06-05 ENCOUNTER — Encounter: Payer: Self-pay | Admitting: General Practice

## 2017-06-05 LAB — QUANTIFERON-TB GOLD PLUS
NIL: 0.61 [IU]/mL
QuantiFERON-TB Gold Plus: NEGATIVE
TB1-NIL: <0 IU/mL
TB2-NIL: <0 IU/mL

## 2017-06-08 ENCOUNTER — Ambulatory Visit
Admission: RE | Admit: 2017-06-08 | Discharge: 2017-06-08 | Disposition: A | Discharge status: Home / Self Care | Payer: 59 | Source: Ambulatory Visit | Attending: Family Medicine | Admitting: Family Medicine

## 2017-06-08 DIAGNOSIS — Z1231 Encounter for screening mammogram for malignant neoplasm of breast: Secondary | ICD-10-CM

## 2017-06-09 ENCOUNTER — Emergency Department (HOSPITAL_COMMUNITY): Payer: 59

## 2017-06-09 ENCOUNTER — Emergency Department (HOSPITAL_COMMUNITY)
Admission: EM | Admit: 2017-06-09 | Discharge: 2017-06-09 | Disposition: A | Discharge status: Home / Self Care | Payer: 59 | Attending: Emergency Medicine | Admitting: Emergency Medicine

## 2017-06-09 ENCOUNTER — Other Ambulatory Visit: Payer: Self-pay

## 2017-06-09 ENCOUNTER — Encounter (HOSPITAL_COMMUNITY): Payer: Self-pay | Admitting: Emergency Medicine

## 2017-06-09 DIAGNOSIS — I10 Essential (primary) hypertension: Secondary | ICD-10-CM | POA: Diagnosis not present

## 2017-06-09 DIAGNOSIS — R0602 Shortness of breath: Secondary | ICD-10-CM | POA: Insufficient documentation

## 2017-06-09 DIAGNOSIS — Z87891 Personal history of nicotine dependence: Secondary | ICD-10-CM | POA: Insufficient documentation

## 2017-06-09 DIAGNOSIS — R079 Chest pain, unspecified: Secondary | ICD-10-CM | POA: Diagnosis not present

## 2017-06-09 DIAGNOSIS — Z79899 Other long term (current) drug therapy: Secondary | ICD-10-CM | POA: Insufficient documentation

## 2017-06-09 DIAGNOSIS — R0789 Other chest pain: Secondary | ICD-10-CM | POA: Diagnosis not present

## 2017-06-09 DIAGNOSIS — R0781 Pleurodynia: Secondary | ICD-10-CM | POA: Diagnosis not present

## 2017-06-09 LAB — BASIC METABOLIC PANEL
Anion gap: 11 (ref 5–15)
BUN: 16 mg/dL (ref 6–20)
CO2: 24 mmol/L (ref 22–32)
Calcium: 9.7 mg/dL (ref 8.9–10.3)
Chloride: 99 mmol/L — ABNORMAL LOW (ref 101–111)
Creatinine, Ser: 0.7 mg/dL (ref 0.44–1.00)
GFR calc Af Amer: >60 mL/min (ref >60)
GFR calc non Af Amer: >60 mL/min (ref >60)
GLUCOSE: 110 mg/dL — AB (ref 65–99)
POTASSIUM: 3.7 mmol/L (ref 3.5–5.1)
Sodium: 134 mmol/L — ABNORMAL LOW (ref 135–145)

## 2017-06-09 LAB — CBC
HEMATOCRIT: 34.1 % — AB (ref 36.0–46.0)
Hemoglobin: 10.1 g/dL — ABNORMAL LOW (ref 12.0–15.0)
MCH: 23.9 pg — AB (ref 26.0–34.0)
MCHC: 29.6 g/dL — AB (ref 30.0–36.0)
MCV: 80.8 fL (ref 78.0–100.0)
Platelets: 401 10*3/uL — ABNORMAL HIGH (ref 150–400)
RBC: 4.22 MIL/uL (ref 3.87–5.11)
RDW: 17.1 % — AB (ref 11.5–15.5)
WBC: 14.1 10*3/uL — ABNORMAL HIGH (ref 4.0–10.5)

## 2017-06-09 LAB — TROPONIN I: Troponin I: <0.03 ng/mL (ref <0.03)

## 2017-06-09 LAB — D-DIMER, QUANTITATIVE: D-Dimer, Quant: 0.47 ug/mL-FEU (ref 0.00–0.50)

## 2017-06-09 MED ORDER — HYDROCODONE-ACETAMINOPHEN 5-325 MG PO TABS: 2.0000 | ORAL_TABLET | ORAL | 0 refills | Status: DC | PRN

## 2017-06-09 MED ORDER — HYDROMORPHONE HCL 1 MG/ML IJ SOLN
1.0000 mg | Freq: Once | INTRAMUSCULAR | Status: AC
  Administered 2017-06-09: 1 mg via INTRAVENOUS
  Filled 2017-06-09: qty 1

## 2017-06-09 MED ORDER — MORPHINE SULFATE (PF) 4 MG/ML IV SOLN: 4.0000 mg | Freq: Once | INTRAVENOUS | Status: DC

## 2017-06-09 MED ORDER — METHYLPREDNISOLONE SODIUM SUCC 125 MG IJ SOLR
125.0000 mg | Freq: Once | INTRAMUSCULAR | Status: AC
  Administered 2017-06-09: 125 mg via INTRAVENOUS
  Filled 2017-06-09: qty 2

## 2017-06-09 MED ORDER — HYDROMORPHONE HCL 1 MG/ML IJ SOLN
0.2500 mg | Freq: Once | INTRAMUSCULAR | Status: AC
  Administered 2017-06-09: 0.25 mg via INTRAVENOUS
  Filled 2017-06-09: qty 1

## 2017-06-09 MED ORDER — PREDNISONE 20 MG PO TABS: 40.0000 mg | ORAL_TABLET | Freq: Every day | ORAL | 0 refills | Status: DC

## 2017-06-09 MED ORDER — ONDANSETRON HCL 4 MG/2ML IJ SOLN
4.0000 mg | Freq: Once | INTRAMUSCULAR | Status: AC | PRN
  Administered 2017-06-09: 4 mg via INTRAVENOUS
  Filled 2017-06-09: qty 2

## 2017-06-09 NOTE — ED Notes (Signed)
Pt request more pain medicine, edp notified

## 2017-06-09 NOTE — Discharge Instructions (Signed)
Your blood work and x-ray have been normal appearing, please take prednisone 40 mg by mouth daily for 5 days, hydrocodone, 1 tablet every 4-6 hours as needed for severe pain.  Please see your doctor this week for follow-up.  Emergency department for severe or worsening symptoms

## 2017-06-09 NOTE — ED Triage Notes (Signed)
Pt c/o of cp starting yesterday. Starting in the center of chest with no exertion.

## 2017-06-09 NOTE — ED Provider Notes (Signed)
[facility] EMERGENCY DEPARTMENT Provider Note   CSN: [id number] Arrival date & time: 06/09/17  1042     History   Chief Complaint Chief Complaint  Patient presents with  . Chest Pain    HPI Dawn Hayes is a 47 y.o. female.  HPI  The patient is a 47 year old female, she has a history of psoriatic arthritis, mild hypertension, she has been treated for costochondritis multiple times in the past and states that she comes in with pain that started yesterday in the mid chest, sharp and stabbing, worse with deep breathing, worse with position, associated with feeling very short of breath.  She denies any swelling of her legs and denies any recent surgery.  She has had recent colonoscopy and upper endoscopy but has not had major surgery since her hysterectomy in 2017.  She denies any prior history of coronary disease though she does smoke cigarettes, she recently quit 2 weeks ago.  The patient denies any hormone therapy, recent long trips or immobilization.  Her last CT angiogram was in October 2018 and at that time it was negative for pulmonary embolism per my medical record review.  Past Medical History:  Diagnosis Date  . Anemia   . Arthritis   . Costochondritis   . Diverticulitis   . Dysmenorrhea   . GERD (gastroesophageal reflux disease)   . Headache    Migraines  . History of hiatal hernia   . Hypertension   . Menorrhagia   . Psoriatic arthritis ([place])   . Smoker     Patient Active Problem List   Diagnosis Date Noted  . Anxiety and depression 05/27/2016  . Status post total abdominal hysterectomy 04/08/2016  . Costochondritis 12/04/2015  . Anemia, iron deficiency 10/10/2015  . Tick bites 10/10/2015  . Fatigue 10/10/2015  . Allergic rhinitis 09/07/2015  . Hemorrhoid   . Diverticulosis of colon without hemorrhage   . Hiatal hernia   . Anemia 05/08/2015  . Rectal bleeding 05/08/2015  . Chest pain, rule out acute myocardial infarction 09/27/2013  . Abnormal  finding on EKG 09/16/2013  . Psoriasis 09/16/2013  . Psoriatic arthritis ([place]) 09/16/2013  . HTN (hypertension) 09/16/2013  . Morbid obesity, unspecified obesity type ([place]) 09/16/2013  . GERD 12/11/2009  . DIARRHEA, CHRONIC 12/11/2009    Past Surgical History:  Procedure Laterality Date  . ABDOMINAL HYSTERECTOMY    . CARPAL TUNNEL RELEASE Left 01/19/2015   Procedure: LEFT CARPAL TUNNEL RELEASE;  Surgeon: Roseanne Kaufman, MD;  Location: [place];  Service: Orthopedics;  Laterality: Left;  . CHOLECYSTECTOMY    . CHOLECYSTECTOMY, LAPAROSCOPIC  2006  . COLONOSCOPY  1998   normal, [place]  . COLONOSCOPY N/A 05/30/2015   RMR: Minimal anal canal hemorrhoids. colonic diverticulosis. I suspect relatively trivial recent gI Bleed likely related to hemorrhoids. This finding alone would not likely adequatly explain her degree of anemia. she likely has a significent component from menstrual losses. , etc.   . COLONOSCOPY WITH PROPOFOL N/A 03/09/2017   Procedure: COLONOSCOPY WITH PROPOFOL;  Surgeon: Daneil Dolin, MD;  [facility];  Service: Endoscopy;  Laterality: N/A;  1:15pm  . CYSTOSCOPY N/A 04/08/2016   Procedure: CYSTOSCOPY;  Surgeon: Nunzio Cobbs, MD;  Location: [place] [facility];  Service: Gynecology;  Laterality: N/A;  . DILATATION & CURETTAGE/HYSTEROSCOPY WITH MYOSURE N/A 01/04/2016   Procedure: DILATATION & CURETTAGE/HYSTEROSCOPY;  Surgeon: Nunzio Cobbs, MD;  Location: [place] [facility];  Service: Gynecology;  Laterality: N/A;  .  ESOPHAGOGASTRODUODENOSCOPY  09/11/99   tiny esophageal erosions with mild erosive reflux/no barrett's/normal stomach  . ESOPHAGOGASTRODUODENOSCOPY N/A 05/30/2015   RMR: Hiatal hernia otherwise negative EGD  . HYSTERECTOMY ABDOMINAL WITH SALPINGECTOMY Bilateral 04/08/2016   Procedure: HYSTERECTOMY ABDOMINAL WITH SALPINGECTOMY;  Surgeon: Nunzio Cobbs, MD;  Location: [place] [facility];  Service: Gynecology;  Laterality: Bilateral;  .  LAPAROSCOPIC BILATERAL SALPINGECTOMY N/A 04/08/2016   Procedure: LAPAROSCOPIC BILATERAL SALPINGECTOMY possible BSO;  Surgeon: Nunzio Cobbs, MD;  Location: [place] [facility];  Service: Gynecology;  Laterality: N/A;  . LAPAROSCOPIC HYSTERECTOMY N/A 04/08/2016   Procedure: ATTEMPTED HYSTERECTOMY TOTAL LAPAROSCOPIC;  Surgeon: Nunzio Cobbs, MD;  Location: [place] [facility];  Service: Gynecology;  Laterality: N/A;  . LAPAROSCOPIC LYSIS OF ADHESIONS  04/08/2016   Procedure: LAPAROSCOPIC LYSIS OF ADHESIONS;  Surgeon: Nunzio Cobbs, MD;  Location: [place] [facility];  Service: Gynecology;;    OB History    Gravida Para Term Preterm AB Living   0             SAB TAB Ectopic Multiple Live Births                   Home Medications    Prior to Admission medications   Medication Sig Start Date End Date Taking? Authorizing Provider  albuterol (PROVENTIL HFA;VENTOLIN HFA) 108 (90 Base) MCG/ACT inhaler Inhale 2 puffs into the lungs every 6 (six) hours as needed for wheezing or shortness of breath. 07/09/16  Yes Midge Minium, MD  Certolizumab Pegol [facility]) Inject into the skin.   Yes [provider]  esomeprazole (NEXIUM) 40 MG capsule TAKE 1 CAPSULE(40 MG) BY MOUTH DAILY BEFORE BREAKFAST 06/03/17  Yes Annitta Needs, NP  hydrochlorothiazide (HYDRODIURIL) 25 MG tablet Take 1 tablet (25 mg total) by mouth daily. Please call [phone] to schedule a Blood Pressure follow up. 05/21/17  Yes Midge Minium, MD  hydrocortisone (ANUSOL-HC) 2.5 % rectal cream Place 1 application rectally as needed for hemorrhoids or anal itching.   Yes [provider]  ibuprofen (ADVIL,MOTRIN) 200 MG tablet Take 800 mg by mouth every 6 (six) hours as needed.   Yes [provider]  lactobacillus acidophilus (BACID) TABS tablet Take 1 tablet by mouth daily.   Yes [provider]  lisinopril (PRINIVIL,ZESTRIL) 10 MG tablet Take 1 tablet (10 mg total) by mouth daily. Please call [phone]  to schedule a Blood pressure follow up appt. 04/30/17  Yes Midge Minium, MD  Multiple Vitamin (MULTIVITAMIN WITH MINERALS) TABS tablet Take 1 tablet by mouth daily.   Yes [provider]  nystatin (MYCOSTATIN) 100000 UNIT/ML suspension SWISH AND SPIT 5 ML BY MOUTH FOUR TIMES DAILY AS NEEDED FOR THRUSH 04/14/17  Yes Midge Minium, MD  traMADol (ULTRAM) 50 MG tablet Take 1 tablet (50 mg total) by mouth every 6 (six) hours as needed. 03/05/17  Yes Tanna Furry, MD  varenicline (CHANTIX PAK) 0.5 MG X 11 & 1 MG X 42 tablet Take one 0.5 mg tablet by mouth once daily for 3 days, then increase to one 0.5 mg tablet twice daily for 4 days, then increase to one 1 mg tablet twice daily. 04/24/17  Yes Brunetta Jeans, PA-C  XIIDRA 5 % SOLN Instill 1 drop into both eyes two times a day 10/10/16  Yes [provider]  HYDROcodone-acetaminophen (NORCO/VICODIN) 5-325 MG tablet Take 2 tablets by mouth every 4 (four) hours as needed. 06/09/17   Sabra Heck,  Aaron Edelman, MD  hydrOXYzine (ATARAX/VISTARIL) 50 MG tablet Take 1 tablet (50 mg total) by mouth 3 (three) times daily as needed. Patient not taking: Reported on 06/09/2017 06/03/17   Midge Minium, MD  predniSONE (DELTASONE) 20 MG tablet Take 2 tablets (40 mg total) by mouth daily. 06/09/17   Noemi Chapel, MD  QVAR REDIHALER 40 MCG/ACT inhaler INHALE 2 PUFFS INTO THE LUNGS TWICE DAILY Patient not taking: Reported on 06/09/2017 03/19/17   Brunetta Jeans, PA-C    Family History Family History  Problem Relation Age of Onset  . Brain cancer Father   . Prostate cancer Father   . Hyperlipidemia Mother   . Colon cancer Maternal Grandmother 40  . Stroke Paternal Grandfather        COD  . Aneurysm Maternal Grandfather   . Cancer Other   . Hypertension Other     Social History Social History   Tobacco Use  . Smoking status: Former Smoker    Packs/day: 0.10    Years: 20.00    Pack years: 2.00    Types: Cigarettes  . Smokeless tobacco:  Never Used  . Tobacco comment: smoking 5 cigarettes a day, hasn't quit completely   Substance Use Topics  . Alcohol use: Yes    Alcohol/week: 9.6 oz    Types: 6 Glasses of wine, 10 Standard drinks or equivalent per week    Comment: socially, weekends 4-5  . Drug use: No     Allergies   Aspirin; Toradol [ketorolac tromethamine]; and Adhesive [tape]   Review of Systems Review of Systems  All other systems reviewed and are negative.    Physical Exam Updated Vital Signs BP 119/76   Pulse (!) 123   Temp 98 F (36.7 C) (Oral)   Resp (!) 24   LMP 02/28/2016   SpO2 96%   Physical Exam  Constitutional: She appears well-developed and well-nourished. No distress.  HENT:  Head: Normocephalic and atraumatic.  Mouth/Throat: Oropharynx is clear and moist. No oropharyngeal exudate.  Eyes: Conjunctivae and EOM are normal. Pupils are equal, round, and reactive to light. Right eye exhibits no discharge. Left eye exhibits no discharge. No scleral icterus.  Neck: Normal range of motion. Neck supple. No JVD present. No thyromegaly present.  Cardiovascular: Regular rhythm, normal heart sounds and intact distal pulses. Exam reveals no gallop and no friction rub.  No murmur heard. Tachycardic to 120, normal pulses  Pulmonary/Chest: Effort normal and breath sounds normal. No respiratory distress. She has no wheezes. She has no rales.  Lung sounds are normal, the patient is shallow breathing  Abdominal: Soft. Bowel sounds are normal. She exhibits no distension and no mass. There is no tenderness.  Musculoskeletal: Normal range of motion. She exhibits no edema or tenderness.  No edema or tenderness of the lower extremities  Lymphadenopathy:    She has no cervical adenopathy.  Neurological: She is alert. Coordination normal.  Skin: Skin is warm and dry. No rash noted. No erythema.  Psychiatric: She has a normal mood and affect. Her behavior is normal.  Nursing note and vitals  reviewed.    ED Treatments / Results  Labs (all labs ordered are listed, but only abnormal results are displayed) Labs Reviewed  BASIC METABOLIC PANEL - Abnormal; Notable for the following components:      Result Value   Sodium 134 (*)    Chloride 99 (*)    Glucose, Bld 110 (*)    All other components within normal limits  CBC - Abnormal; Notable for the following components:   WBC 14.1 (*)    Hemoglobin 10.1 (*)    HCT 34.1 (*)    MCH 23.9 (*)    MCHC 29.6 (*)    RDW 17.1 (*)    Platelets 401 (*)    All other components within normal limits  TROPONIN I  D-DIMER, QUANTITATIVE (NOT AT [facility])    EKG  EKG Interpretation  Date/Time:  Tuesday June 09 2017 10:54:55 EST Ventricular Rate:  116 PR Interval:  156 QRS Duration: 80 QT Interval:  334 QTC Calculation: 464 R Axis:   60 Text Interpretation:  Sinus tachycardia Low voltage QRS Cannot rule out Anterior infarct , age undetermined Abnormal ECG No STEMI.  Confirmed by Nanda Quinton [phone]) on 06/09/2017 11:08:04 AM       Radiology Dg Chest 2 View  Result Date: 06/09/2017 CLINICAL DATA:  Onset of mid chest pain at rest yesterday. EXAM: CHEST  2 VIEW COMPARISON:  Chest x-ray of March 22, 2018. FINDINGS: The lungs are adequately inflated and clear. The heart is top-normal in size. The pulmonary vascularity is normal. The mediastinum is normal in width. There is no pleural effusion. The bony thorax exhibits no acute abnormality. IMPRESSION: Top-normal cardiac size without pulmonary edema. No acute cardiopulmonary disease. Electronically Signed   By: David  Martinique M.D.   On: 06/09/2017 13:21   Mm Screening Breast Tomo Bilateral  Result Date: 06/08/2017 CLINICAL DATA:  Screening. EXAM: 2D DIGITAL SCREENING BILATERAL MAMMOGRAM WITH 3D TOMO WITH CAD COMPARISON:  Previous exam(s). ACR Breast Density Category a: The breast tissue is almost entirely fatty. FINDINGS: There are no findings suspicious for malignancy. Images were  processed with CAD. IMPRESSION: No mammographic evidence of malignancy. A result letter of this screening mammogram will be mailed directly to the patient. RECOMMENDATION: Screening mammogram in one year. (Code:SM-B-01Y) BI-RADS CATEGORY  1: Negative. Electronically Signed   By: Curlene Dolphin M.D.   On: 06/08/2017 16:14    Procedures Procedures (including critical care time)  Medications Ordered in ED Medications  HYDROmorphone (DILAUDID) injection 0.25 mg (0.25 mg Intravenous Given 06/09/17 1300)  methylPREDNISolone sodium succinate (SOLU-MEDROL) 125 mg/2 mL injection 125 mg (125 mg Intravenous Given 06/09/17 1528)  HYDROmorphone (DILAUDID) injection 1 mg (1 mg Intravenous Given 06/09/17 1528)  ondansetron (ZOFRAN) injection 4 mg (4 mg Intravenous Given 06/09/17 1533)     Initial Impression / Assessment and Plan / ED Course  I have reviewed the triage vital signs and the nursing notes.  Pertinent labs & imaging results that were available during my care of the patient were reviewed by me and considered in my medical decision making (see chart for details).  Clinical Course as of Jun 10 1547  Tue Jun 09, 2017  1333 I have viewed and interpreted the 2 view PA and lateral chest x-ray, the patient has normal-appearing lungs, slight cardiomegaly, no signs of pulmonary infiltrates or edema or mediastinal abnormalities and no subdiaphragmatic air.  [BM]  1411 D dimer normal VS to be rechecked - has normal oxygenation after pulse ox fixed. Dilaudid given[   [BM]  6045 Labs reviewed, x-ray reviewed, these were reviewed with the patient at the bedside.  The patient was informed of the findings, she is continued to be tachycardic at approximately 115 bpm on my last exam, she has a normal oxygen level on room air, she still is tachypneic and states that she is limited because when she takes a deep breath it hurts.  Will give Solu-Medrol, another dose of Dilaudid, no signs of pulmonary embolism based on  testing.  I suspect this is recurrent inflammatory disease of her chest, she states that she has seen a rheumatologist for this in the past.  [BM]  1524 I discussed the patient's care with Elyn Aquas, the physician assistant at his family doctor's office, his family doctor is not available for the call.  Mr. Hassell Done recommends that the patient can follow-up in the office, he will expedite the follow-up for her and states that she has been seen for this exact same complaint in the past, he has even seen her for the exact same issues.  He does recommend and agrees with prednisone and pain medications for a short course.  [BM]    Clinical Course User Index [BM] Noemi Chapel, MD   The patient's EKG is abnormal and that it is tachycardic, she has had a screening mammogram which was performed yesterday and was negative.  She will need a chest x-ray here, d-dimer, she has had multiple workups for PE in the past and states she has had lots of radiation and would like to try to avoid it.  Her pulse oximeter is not picking up very well, nail polish was removed, this was retried, it is still bouncing between the mid 80s and the mid 90s.  Because of tachycardia and a relative hypoxia which albeit could be effort related given her pain with breathing she will need a further workup including a d-dimer.  If this is negative she can probably avoid the CT scan.  She is allergic to NSAIDs and so we will give her endorse of morphine.  Final Clinical Impressions(s) / ED Diagnoses   Final diagnoses:  Pleuritic chest pain    ED Discharge Orders        Ordered    predniSONE (DELTASONE) 20 MG tablet  Daily     06/09/17 1548    HYDROcodone-acetaminophen (NORCO/VICODIN) 5-325 MG tablet  Every 4 hours PRN     06/09/17 1548       Noemi Chapel, MD 06/09/17 1549

## 2017-06-14 ENCOUNTER — Other Ambulatory Visit: Payer: Self-pay | Admitting: Physician Assistant

## 2017-06-15 DIAGNOSIS — L4050 Arthropathic psoriasis, unspecified: Secondary | ICD-10-CM | POA: Diagnosis not present

## 2017-06-16 ENCOUNTER — Other Ambulatory Visit: Payer: Self-pay

## 2017-06-16 ENCOUNTER — Ambulatory Visit: Payer: 59 | Admitting: Physician Assistant

## 2017-06-16 ENCOUNTER — Encounter: Payer: Self-pay | Admitting: Physician Assistant

## 2017-06-16 VITALS — BP 102/78 | HR 96 | Temp 97.8°F | Resp 16 | Ht 62.0 in | Wt 273.0 lb

## 2017-06-16 DIAGNOSIS — S93402A Sprain of unspecified ligament of left ankle, initial encounter: Secondary | ICD-10-CM

## 2017-06-16 NOTE — Progress Notes (Signed)
Patient presents to clinic today c/o pain in left ankle after fall this morning. Patient endorses sliding in water from her dog's water bowl, everting her ankle. Fell onto the floor. Denies head trauma or trauma to other extremities. Has noted pain and swelling in ankle since then. Has taken some ibuprofen which is helping. Denies any numbness or tingling of area.   Past Medical History:  Diagnosis Date  . Anemia   . Arthritis   . Costochondritis   . Diverticulitis   . Dysmenorrhea   . GERD (gastroesophageal reflux disease)   . Headache    Migraines  . History of hiatal hernia   . Hypertension   . Menorrhagia   . Psoriatic arthritis ([place])   . Smoker     Current Outpatient Medications on File Prior to Visit  Medication Sig Dispense Refill  . albuterol (PROVENTIL HFA;VENTOLIN HFA) 108 (90 Base) MCG/ACT inhaler Inhale 2 puffs into the lungs every 6 (six) hours as needed for wheezing or shortness of breath. 1 Inhaler 2  . Certolizumab Pegol (CIMZIA [place]) Inject into the skin.    Marland Kitchen esomeprazole (NEXIUM) 40 MG capsule TAKE 1 CAPSULE(40 MG) BY MOUTH DAILY BEFORE BREAKFAST 30 capsule 3  . hydrochlorothiazide (HYDRODIURIL) 25 MG tablet Take 1 tablet (25 mg total) by mouth daily. Please call [phone] to schedule a Blood Pressure follow up. 30 tablet 2  . HYDROcodone-acetaminophen (NORCO/VICODIN) 5-325 MG tablet Take 2 tablets by mouth every 4 (four) hours as needed. 10 tablet 0  . hydrocortisone (ANUSOL-HC) 2.5 % rectal cream Place 1 application rectally as needed for hemorrhoids or anal itching.    . hydrOXYzine (ATARAX/VISTARIL) 50 MG tablet Take 1 tablet (50 mg total) by mouth 3 (three) times daily as needed. 45 tablet 0  . ibuprofen (ADVIL,MOTRIN) 200 MG tablet Take 800 mg by mouth every 6 (six) hours as needed.    . lactobacillus acidophilus (BACID) TABS tablet Take 1 tablet by mouth daily.    Marland Kitchen lisinopril (PRINIVIL,ZESTRIL) 10 MG tablet Take 1 tablet (10 mg total) by mouth daily. Please  call [phone] to schedule a Blood pressure follow up appt. 30 tablet 1  . Multiple Vitamin (MULTIVITAMIN WITH MINERALS) TABS tablet Take 1 tablet by mouth daily.    Marland Kitchen nystatin (MYCOSTATIN) 100000 UNIT/ML suspension SWISH AND SPIT 5 ML BY MOUTH FOUR TIMES DAILY AS NEEDED FOR THRUSH 473 mL 0  . predniSONE (DELTASONE) 20 MG tablet Take 2 tablets (40 mg total) by mouth daily. 10 tablet 0  . QVAR REDIHALER 40 MCG/ACT inhaler INHALE 2 PUFFS INTO THE LUNGS TWICE DAILY 10.6 g 3  . traMADol (ULTRAM) 50 MG tablet Take 1 tablet (50 mg total) by mouth every 6 (six) hours as needed. 15 tablet 0  . varenicline (CHANTIX PAK) 0.5 MG X 11 & 1 MG X 42 tablet Take one 0.5 mg tablet by mouth once daily for 3 days, then increase to one 0.5 mg tablet twice daily for 4 days, then increase to one 1 mg tablet twice daily. 53 tablet 0  . XIIDRA 5 % SOLN Instill 1 drop into both eyes two times a day  12   No current facility-administered medications on file prior to visit.     Allergies  Allergen Reactions  . Aspirin Other (See Comments)    Convulsions   . Toradol [Ketorolac Tromethamine] Nausea And Vomiting  . Adhesive [Tape] Itching and Rash    Family History  Problem Relation Age of Onset  . Brain  cancer Father   . Prostate cancer Father   . Hyperlipidemia Mother   . Colon cancer Maternal Grandmother 41  . Stroke Paternal Grandfather        COD  . Aneurysm Maternal Grandfather   . Cancer Other   . Hypertension Other     Social History   Socioeconomic History  . Marital status: Married    Spouse name: None  . Number of children: 0  . Years of education: None  . Highest education level: None  Social Needs  . Financial resource strain: None  . Food insecurity - worry: Never true  . Food insecurity - inability: Never true  . Transportation needs - medical: None  . Transportation needs - non-medical: None  Occupational History  . Occupation: [employer], [place]  Tobacco Use  . Smoking  status: Former Smoker    Packs/day: 0.10    Years: 20.00    Pack years: 2.00    Types: Cigarettes  . Smokeless tobacco: Never Used  . Tobacco comment: smoking 5 cigarettes a day, hasn't quit completely   Substance and Sexual Activity  . Alcohol use: Yes    Alcohol/week: 9.6 oz    Types: 6 Glasses of wine, 10 Standard drinks or equivalent per week    Comment: socially, weekends 4-5  . Drug use: No  . Sexual activity: Yes    Partners: Male    Birth control/protection: None  Other Topics Concern  . None  Social History Narrative  . None   Review of Systems - See HPI.  All other ROS are negative.  BP 102/78   Pulse 96   Temp 97.8 F (36.6 C) (Oral)   Resp 16   Ht '5\' 2"'  (1.575 m)   Wt 273 lb (123.8 kg)   LMP 02/28/2016   SpO2 98%   BMI 49.93 kg/m   Physical Exam  Constitutional: She is oriented to person, place, and time and well-developed, well-nourished, and in no distress.  HENT:  Head: Normocephalic and atraumatic.  Eyes: Conjunctivae are normal.  Neck: Neck supple.  Cardiovascular: Normal rate, regular rhythm, normal heart sounds and intact distal pulses.  Pulmonary/Chest: Effort normal and breath sounds normal. No respiratory distress. She has no wheezes. She has no rales. She exhibits no tenderness.  Musculoskeletal:       Left ankle: She exhibits swelling. She exhibits normal range of motion and no ecchymosis. Tenderness. AITFL tenderness found. No lateral malleolus and no medial malleolus tenderness found. Achilles tendon normal.  Neurological: She is alert and oriented to person, place, and time.  Skin: Skin is warm and dry. No rash noted.  Psychiatric: Affect normal.  Vitals reviewed.   Recent Results (from the past 2160 hour(s))  QuantiFERON-TB Gold Plus     Status: None   Collection Time: 06/03/17  2:31 PM  Result Value Ref Range   QuantiFERON-TB Gold Plus NEGATIVE NEGATIVE    Comment: Negative test result. M. tuberculosis complex  infection  unlikely.    NIL 0.61 IU/mL   Mitogen-NIL >10.00 IU/mL   TB1-NIL <0.00 IU/mL   TB2-NIL <0.00 IU/mL    Comment: . The Nil tube value reflects the background interferon gamma immune response of the patient's blood sample. This value has been subtracted from the patient's displayed TB and Mitogen results. . Lower than expected results with the Mitogen tube prevent false-negative Quantiferon readings by detecting a patient with a potential immune suppressive condition and/or suboptimal pre-analytical specimen handling. . The TB1 Antigen  tube is coated with the M. tuberculosis-specific antigens designed to elicit responses from TB antigen primed CD4+ helper T-lymphocytes. . The TB2 Antigen tube is coated with the M. tuberculosis-specific antigens designed to elicit responses from TB antigen primed CD4+ helper and CD8+ cytotoxic T-lymphocytes. . For additional information, please refer to http://education.questdiagnostics.com/faq/204 (This link is being provided for informational/ educational purposes only.) .   Basic metabolic panel     Status: Abnormal   Collection Time: 06/09/17 11:26 AM  Result Value Ref Range   Sodium 134 (L) 135 - 145 mmol/L   Potassium 3.7 3.5 - 5.1 mmol/L   Chloride 99 (L) 101 - 111 mmol/L   CO2 24 22 - 32 mmol/L   Glucose, Bld 110 (H) 65 - 99 mg/dL   BUN 16 6 - 20 mg/dL   Creatinine, Ser 0.70 0.44 - 1.00 mg/dL   Calcium 9.7 8.9 - 10.3 mg/dL   GFR calc non Af Amer >60 >60 mL/min   GFR calc Af Amer >60 >60 mL/min    Comment: (NOTE) The eGFR has been calculated using the CKD EPI equation. This calculation has not been validated in all clinical situations. eGFR's persistently <60 mL/min signify possible Chronic Kidney Disease.    Anion gap 11 5 - 15  CBC     Status: Abnormal   Collection Time: 06/09/17 11:26 AM  Result Value Ref Range   WBC 14.1 (H) 4.0 - 10.5 K/uL   RBC 4.22 3.87 - 5.11 MIL/uL   Hemoglobin 10.1 (L) 12.0 - 15.0 g/dL   HCT  34.1 (L) 36.0 - 46.0 %   MCV 80.8 78.0 - 100.0 fL   MCH 23.9 (L) 26.0 - 34.0 pg   MCHC 29.6 (L) 30.0 - 36.0 g/dL   RDW 17.1 (H) 11.5 - 15.5 %   Platelets 401 (H) 150 - 400 K/uL  Troponin I     Status: None   Collection Time: 06/09/17 11:26 AM  Result Value Ref Range   Troponin I <0.03 <0.03 ng/mL  D-dimer, quantitative (not at [facility])     Status: None   Collection Time: 06/09/17  1:26 PM  Result Value Ref Range   D-Dimer, Quant 0.47 0.00 - 0.50 ug/mL-FEU    Comment: (NOTE) At the manufacturer cut-off of 0.50 ug/mL FEU, this assay has been documented to exclude PE with a sensitivity and negative predictive value of 97 to 99%.  At this time, this assay has not been approved by the FDA to exclude DVT/VTE. Results should be correlated with clinical presentation.     Assessment/Plan: 1. Sprain of left ankle, unspecified ligament, initial encounter No bony tenderness. Tenderness over AITFL noted. Extremity is neurovascularly intact. ASO brace applied. Supportive measures and OTC medications reviewed. Rehab exercises reviewed. Follow-up if symptoms are not improving.    Leeanne Rio, PA-C

## 2017-06-16 NOTE — Patient Instructions (Signed)
Please wear brace daily as directed over the next 5-7 days as this calms down. Tylenol for pain since you are taking Prednisone currently.  Elevate the ankle while resting. Start the stretching exercises below in the next day or so as pain improves.    Ankle Sprain, Phase I Rehab Ask your health care provider which exercises are safe for you. Do exercises exactly as told by your health care provider and adjust them as directed. It is normal to feel mild stretching, pulling, tightness, or discomfort as you do these exercises, but you should stop right away if you feel sudden pain or your pain gets worse.Do not begin these exercises until told by your health care provider. Stretching and range of motion exercises These exercises warm up your muscles and joints and improve the movement and flexibility of your lower leg and ankle. These exercises also help to relieve pain and stiffness. Exercise A: Gastroc and soleus stretch  1. Sit on the floor with your left / right leg extended. 2. Loop a belt or towel around the ball of your left / right foot. The ball of your foot is on the walking surface, right under your toes. 3. Keep your left / right ankle and foot relaxed and keep your knee straight while you use the belt or towel to pull your foot toward you. You should feel a gentle stretch behind your calf or knee. 4. Hold this position for __________ seconds, then release to the starting position. Repeat the exercise with your knee bent. You can put a pillow or a rolled bath towel under your knee to support it. You should feel a stretch deep in your calf or at your Achilles tendon. Repeat each stretch __________ times. Complete these stretches __________ times a day. Exercise B: Ankle alphabet  1. Sit with your left / right leg supported at the lower leg. ? Do not rest your foot on anything. ? Make sure your foot has room to move freely. 2. Think of your left / right foot as a paintbrush, and move  your foot to trace each letter of the alphabet in the air. Keep your hip and knee still while you trace. Make the letters as large as you can without feeling discomfort. 3. Trace every letter from A to Z. Repeat __________ times. Complete this exercise __________ times a day. Strengthening exercises These exercises build strength and endurance in your ankle and lower leg. Endurance is the ability to use your muscles for a long time, even after they get tired. Exercise C: Dorsiflexors  1. Secure a rubber exercise band or tube to an object, such as a table leg, that will stay still when the band is pulled. Secure the other end around your left / right foot. 2. Sit on the floor facing the object, with your left / right leg extended. The band or tube should be slightly tense when your foot is relaxed. 3. Slowly bring your foot toward you, pulling the band tighter. 4. Hold this position for __________ seconds. 5. Slowly return your foot to the starting position. Repeat __________ times. Complete this exercise __________ times a day. Exercise D: Plantar flexors  1. Sit on the floor with your left / right leg extended. 2. Loop a rubber exercise tube or band around the ball of your left / right foot. The ball of your foot is on the walking surface, right under your toes. ? Hold the ends of the band or tube in your hands. ?  The band or tube should be slightly tense when your foot is relaxed. 3. Slowly point your foot and toes downward, pushing them away from you. 4. Hold this position for __________ seconds. 5. Slowly return your foot to the starting position. Repeat __________ times. Complete this exercise __________ times a day. Exercise E: Evertors 1. Sit on the floor with your legs straight out in front of you. 2. Loop a rubber exercise band or tube around the ball of your left / right foot. The ball of your foot is on the walking surface, right under your toes. ? Hold the ends of the band in  your hands, or secure the band to a stable object. ? The band or tube should be slightly tense when your foot is relaxed. 3. Slowly push your foot outward, away from your other leg. 4. Hold this position for __________ seconds. 5. Slowly return your foot to the starting position. Repeat __________ times. Complete this exercise __________ times a day. This information is not intended to replace advice given to you by your health care provider. Make sure you discuss any questions you have with your health care provider. Document Released: 11/27/2004 Document Revised: 01/03/2016 Document Reviewed: 03/12/2015 Elsevier Interactive Patient Education  2018 [employer].

## 2017-06-17 ENCOUNTER — Encounter: Payer: Self-pay | Admitting: Gastroenterology

## 2017-06-17 ENCOUNTER — Ambulatory Visit: Payer: 59 | Admitting: Gastroenterology

## 2017-06-17 VITALS — BP 125/78 | HR 103 | Temp 98.4°F | Ht 62.0 in | Wt 266.6 lb

## 2017-06-17 DIAGNOSIS — K219 Gastro-esophageal reflux disease without esophagitis: Secondary | ICD-10-CM | POA: Diagnosis not present

## 2017-06-17 DIAGNOSIS — K649 Unspecified hemorrhoids: Secondary | ICD-10-CM

## 2017-06-17 DIAGNOSIS — D649 Anemia, unspecified: Secondary | ICD-10-CM

## 2017-06-17 NOTE — Assessment & Plan Note (Signed)
Doing well on Nexium but she would like to try and come off of it. Recommendations provided, see patient instructions.

## 2017-06-17 NOTE — Progress Notes (Signed)
CC'D TO PCP °

## 2017-06-17 NOTE — Progress Notes (Signed)
Please let patient know that after her leaving office I was looking at her recent labs done through the ER. Her Hgb was somewhat lower. She has chronic anemia but her Hgb had been in 12-13 range last year except in 02/2017 when she was bleeding. It got down to 11.1 but last week 10.1.   I would like to have her do some additional labs if she is willing.  I doubt she was a recent blood donor but please ask.   Lab: CBC, iron/tibc, ferritin,

## 2017-06-17 NOTE — Patient Instructions (Signed)
1. Trial off of nexium as discussed. If you have recurrent heartburn more than 3-4 times weekly you should resume Nexium daily.  2. Return to the office in two years or sooner if needed.

## 2017-06-17 NOTE — Assessment & Plan Note (Addendum)
S/p banding of two columns. No further gi bleeding noted. I noted after patient left the office, her Hgb lower last week then back in 02/2017 when she had the rectal bleeding.   Will further investigate her anemia at this time, would advise doing so prior to consideration of weight loss surgery.

## 2017-06-17 NOTE — Addendum Note (Signed)
Addended by: Mahala Menghini on: 06/17/2017 04:14 PM   Modules accepted: Orders

## 2017-06-17 NOTE — Progress Notes (Signed)
Primary Care Physician: Midge Minium, MD  Primary Gastroenterologist:  Garfield Cornea, MD   Chief Complaint  Patient presents with  . Follow-up    pp.  . Rectal Bleeding    none at all    HPI: Dawn Hayes is a 47 y.o. female here for follow up of colonoscopy and hemorrhoid banding. She had both left lateral and right anterior internal hemorrhoids banded in 03/2017. She is doing very well. No bleeding since then. No abd pain. No constipation. She is planning on weight loss surgery in six months. Wonders about coming off Nexium. Prior EGD with normal esophagus in 2017. Recent UGI series unremarkable. No dysphagia.   Noted anemia back in 02/2017 when she presented with rectal bleeding. However, last week labs also show anemia with Hgb 10.1. She denied melena, brbpr. S/p hysterectomy.   Current Outpatient Medications  Medication Sig Dispense Refill  . Certolizumab Pegol (CIMZIA [place]) Inject into the skin. Every other week    . esomeprazole (NEXIUM) 40 MG capsule TAKE 1 CAPSULE(40 MG) BY MOUTH DAILY BEFORE BREAKFAST 30 capsule 3  . hydrochlorothiazide (HYDRODIURIL) 25 MG tablet Take 1 tablet (25 mg total) by mouth daily. Please call [phone] to schedule a Blood Pressure follow up. 30 tablet 2  . HYDROcodone-acetaminophen (NORCO/VICODIN) 5-325 MG tablet Take 2 tablets by mouth every 4 (four) hours as needed. 10 tablet 0  . hydrocortisone (ANUSOL-HC) 2.5 % rectal cream Place 1 application rectally as needed for hemorrhoids or anal itching.    Marland Kitchen ibuprofen (ADVIL,MOTRIN) 200 MG tablet Take 800 mg by mouth every 6 (six) hours as needed.    . lactobacillus acidophilus (BACID) TABS tablet Take 1 tablet by mouth daily.    Marland Kitchen lisinopril (PRINIVIL,ZESTRIL) 10 MG tablet Take 1 tablet (10 mg total) by mouth daily. Please call [phone] to schedule a Blood pressure follow up appt. 30 tablet 1  . Multiple Vitamin (MULTIVITAMIN WITH MINERALS) TABS tablet Take 1 tablet by mouth daily.    Marland Kitchen  nystatin (MYCOSTATIN) 100000 UNIT/ML suspension SWISH AND SPIT 5 ML BY MOUTH FOUR TIMES DAILY AS NEEDED FOR THRUSH 473 mL 0  . predniSONE (DELTASONE) 20 MG tablet Take 2 tablets (40 mg total) by mouth daily. (Patient taking differently: Take 15 mg by mouth daily. ) 10 tablet 0  . traMADol (ULTRAM) 50 MG tablet Take 1 tablet (50 mg total) by mouth every 6 (six) hours as needed. 15 tablet 0  . XIIDRA 5 % SOLN Instill 1 drop into both eyes two times a day  12  . albuterol (PROVENTIL HFA;VENTOLIN HFA) 108 (90 Base) MCG/ACT inhaler Inhale 2 puffs into the lungs every 6 (six) hours as needed for wheezing or shortness of breath. (Patient not taking: Reported on 06/17/2017) 1 Inhaler 2   No current facility-administered medications for this visit.     Allergies as of 06/17/2017 - Review Complete 06/17/2017  Allergen Reaction Noted  . Aspirin Other (See Comments) 12/06/2009  . Toradol [ketorolac tromethamine] Nausea And Vomiting 04/07/2016  . Adhesive [tape] Itching and Rash 09/26/2013    ROS:  General: Negative for anorexia, weight loss, fever, chills, fatigue, weakness. ENT: Negative for hoarseness, difficulty swallowing , nasal congestion. CV: Negative for chest pain, angina, palpitations, dyspnea on exertion, peripheral edema.  Respiratory: Negative for dyspnea at rest, dyspnea on exertion, cough, sputum, wheezing.  GI: See history of present illness. GU:  Negative for dysuria, hematuria, urinary incontinence, urinary frequency, nocturnal urination.  Endo: Negative for  unusual weight change.    Physical Examination:   BP 125/78   Pulse (!) 103   Temp 98.4 F (36.9 C) (Oral)   Ht 5\' 2"  (1.575 m)   Wt 266 lb 9.6 oz (120.9 kg)   LMP 02/28/2016   BMI 48.76 kg/m   General: Well-nourished, well-developed in no acute distress.  Eyes: No icterus. Mouth: Oropharyngeal mucosa moist and pink , no lesions erythema or exudate. Lungs: Clear to auscultation bilaterally.  Heart: Regular rate and  rhythm, no murmurs rubs or gallops.  Abdomen: Bowel sounds are normal, nontender, nondistended, no hepatosplenomegaly or masses, no abdominal bruits or hernia , no rebound or guarding.   Extremities: No lower extremity edema. No clubbing or deformities. Neuro: Alert and oriented x 4   Skin: Warm and dry, no jaundice.   Psych: Alert and cooperative, normal mood and affect.  Imaging Studies: Dg Chest 2 View  Result Date: 06/09/2017 CLINICAL DATA:  Onset of mid chest pain at rest yesterday. EXAM: CHEST  2 VIEW COMPARISON:  Chest x-ray of March 22, 2018. FINDINGS: The lungs are adequately inflated and clear. The heart is top-normal in size. The pulmonary vascularity is normal. The mediastinum is normal in width. There is no pleural effusion. The bony thorax exhibits no acute abnormality. IMPRESSION: Top-normal cardiac size without pulmonary edema. No acute cardiopulmonary disease. Electronically Signed   By: David  Martinique M.D.   On: 06/09/2017 13:21   Dg Chest 2 View  Result Date: 05/22/2017 CLINICAL DATA:  Preop bariatric surgery EXAM: CHEST  2 VIEW COMPARISON:  02/11/2017 FINDINGS: Heart and mediastinal contours are within normal limits. No focal opacities or effusions. No acute bony abnormality. IMPRESSION: No active cardiopulmonary disease. Electronically Signed   By: Rolm Baptise M.D.   On: 05/22/2017 11:26   Dg Duanne Limerick  W/kub  Result Date: 05/22/2017 CLINICAL DATA:  Pre bariatric screening. EXAM: UPPER GI SERIES WITH KUB TECHNIQUE: After obtaining a scout radiograph a routine upper GI series was performed using thin barium FLUOROSCOPY TIME:  Fluoroscopy Time:  1 minute 30 seconds Radiation Exposure Index (if provided by the fluoroscopic device): 26.7 mGy Number of Acquired Spot Images: 0 COMPARISON:  03/05/2017 FINDINGS: On the scout radiograph cholecystectomy clips are noted in the right upper quadrant of the abdomen. The esophagus is patent. There is a normal motility of the esophagus. No  stricture or mass. The stomach, duodenal bulb and sweep have a normal configuration. No reflux identified. IMPRESSION: 1. Normal upper GI exam. Electronically Signed   By: Kerby Moors M.D.   On: 05/22/2017 10:52   Mm Screening Breast Tomo Bilateral  Result Date: 06/08/2017 CLINICAL DATA:  Screening. EXAM: 2D DIGITAL SCREENING BILATERAL MAMMOGRAM WITH 3D TOMO WITH CAD COMPARISON:  Previous exam(s). ACR Breast Density Category a: The breast tissue is almost entirely fatty. FINDINGS: There are no findings suspicious for malignancy. Images were processed with CAD. IMPRESSION: No mammographic evidence of malignancy. A result letter of this screening mammogram will be mailed directly to the patient. RECOMMENDATION: Screening mammogram in one year. (Code:SM-B-01Y) BI-RADS CATEGORY  1: Negative. Electronically Signed   By: Curlene Dolphin M.D.   On: 06/08/2017 16:14   Lab Results  Component Value Date   WBC 14.1 (H) 06/09/2017   HGB 10.1 (L) 06/09/2017   HCT 34.1 (L) 06/09/2017   MCV 80.8 06/09/2017   PLT 401 (H) 06/09/2017   Lab Results  Component Value Date   CREATININE 0.70 06/09/2017   BUN 16 06/09/2017  NA 134 (L) 06/09/2017   K 3.7 06/09/2017   CL 99 (L) 06/09/2017   CO2 24 06/09/2017   Lab Results  Component Value Date   ALT 43 03/05/2017   AST 35 03/05/2017   ALKPHOS 57 03/05/2017   BILITOT 0.6 03/05/2017

## 2017-06-18 NOTE — Progress Notes (Signed)
Spoke with pt, she is ok with having lab work done. Pt is going to call back and let me know where she's going to have lab work done. Can fax orders if not in Epic.

## 2017-06-19 ENCOUNTER — Other Ambulatory Visit (INDEPENDENT_AMBULATORY_CARE_PROVIDER_SITE_OTHER): Payer: 59

## 2017-06-19 ENCOUNTER — Encounter: Payer: Self-pay | Admitting: Family Medicine

## 2017-06-19 DIAGNOSIS — D649 Anemia, unspecified: Secondary | ICD-10-CM | POA: Diagnosis not present

## 2017-06-19 MED ORDER — VARENICLINE TARTRATE 1 MG PO TABS: 1.0000 mg | ORAL_TABLET | Freq: Two times a day (BID) | ORAL | 1 refills | Status: DC

## 2017-06-20 LAB — CBC WITH DIFFERENTIAL/PLATELET
BASOS ABS: 66 {cells}/uL (ref 0–200)
Basophils Relative: 0.5 %
EOS ABS: 157 {cells}/uL (ref 15–500)
Eosinophils Relative: 1.2 %
HCT: 33.6 % — ABNORMAL LOW (ref 35.0–45.0)
Hemoglobin: 10.3 g/dL — ABNORMAL LOW (ref 11.7–15.5)
Lymphs Abs: 3052 cells/uL (ref 850–3900)
MCH: 23.6 pg — AB (ref 27.0–33.0)
MCHC: 30.7 g/dL — AB (ref 32.0–36.0)
MCV: 77.1 fL — ABNORMAL LOW (ref 80.0–100.0)
MONOS PCT: 5.7 %
MPV: 10.3 fL (ref 7.5–12.5)
Neutro Abs: 9078 cells/uL — ABNORMAL HIGH (ref 1500–7800)
Neutrophils Relative %: 69.3 %
PLATELETS: 462 10*3/uL — AB (ref 140–400)
RBC: 4.36 10*6/uL (ref 3.80–5.10)
RDW: 16.6 % — ABNORMAL HIGH (ref 11.0–15.0)
TOTAL LYMPHOCYTE: 23.3 %
WBC mixed population: 747 cells/uL (ref 200–950)
WBC: 13.1 10*3/uL — ABNORMAL HIGH (ref 3.8–10.8)

## 2017-06-20 LAB — IRON,TIBC AND FERRITIN PANEL
%SAT: 8 % (calc) — ABNORMAL LOW (ref 11–50)
Ferritin: 12 ng/mL (ref 10–232)
IRON: 31 ug/dL — AB (ref 40–190)
TIBC: 387 mcg/dL (calc) (ref 250–450)

## 2017-06-22 ENCOUNTER — Inpatient Hospital Stay: Payer: Self-pay | Admitting: Family Medicine

## 2017-06-23 NOTE — Progress Notes (Signed)
Pt had labs done.

## 2017-06-24 ENCOUNTER — Telehealth: Payer: Self-pay | Admitting: Internal Medicine

## 2017-06-24 ENCOUNTER — Telehealth: Payer: Self-pay | Admitting: Family Medicine

## 2017-06-24 DIAGNOSIS — Z720 Tobacco use: Secondary | ICD-10-CM | POA: Diagnosis not present

## 2017-06-24 LAB — BASIC METABOLIC PANEL
BUN: 15 (ref 4–21)
CREATININE: 0.7 (ref 0.5–1.1)
Glucose: 113
Potassium: 3.9 (ref 3.4–5.3)
SODIUM: 138 (ref 137–147)

## 2017-06-24 LAB — CBC AND DIFFERENTIAL
HEMATOCRIT: 33 — AB (ref 36–46)
HEMOGLOBIN: 10.2 — AB (ref 12.0–16.0)
Neutrophils Absolute: 6736
PLATELETS: 369 (ref 150–399)
WBC: 10.3

## 2017-06-24 LAB — HEPATIC FUNCTION PANEL
ALK PHOS: 63 (ref 25–125)
ALT: 24 (ref 7–35)
AST: 16 (ref 13–35)
BILIRUBIN, TOTAL: 0.3

## 2017-06-24 LAB — TSH: TSH: 1.16 (ref 0.41–5.90)

## 2017-06-24 LAB — VITAMIN D 25 HYDROXY (VIT D DEFICIENCY, FRACTURES): Vit D, 25-Hydroxy: 28

## 2017-06-24 NOTE — Telephone Encounter (Signed)
Copied from [place] [id number]. Topic: General - Other >> Jun 24, 2017  9:23 AM Lolita Rieger, RMA wrote: Reason for CRM: pt called back again for lab results and would like a call to her cell number once they are available

## 2017-06-24 NOTE — Telephone Encounter (Signed)
Labs were from GI doctor.  Patient is aware that results will have to come from their office.

## 2017-06-24 NOTE — Telephone Encounter (Signed)
Pt called to see if her lab results were available, please call [phone]

## 2017-06-24 NOTE — Progress Notes (Signed)
Labs suggestive of IDA.   EGD 2017 and TCS 2018.   She needs to complete ifobt.  Start ferrous sulfate 325mg  bid.

## 2017-06-25 NOTE — Telephone Encounter (Signed)
Pt was notified of results

## 2017-06-26 ENCOUNTER — Other Ambulatory Visit: Payer: Self-pay | Admitting: Family Medicine

## 2017-06-29 ENCOUNTER — Other Ambulatory Visit: Payer: Self-pay

## 2017-06-29 ENCOUNTER — Ambulatory Visit (INDEPENDENT_AMBULATORY_CARE_PROVIDER_SITE_OTHER): Payer: 59 | Admitting: Gastroenterology

## 2017-06-29 DIAGNOSIS — D649 Anemia, unspecified: Secondary | ICD-10-CM | POA: Diagnosis not present

## 2017-07-01 ENCOUNTER — Other Ambulatory Visit: Payer: Self-pay

## 2017-07-01 DIAGNOSIS — D649 Anemia, unspecified: Secondary | ICD-10-CM

## 2017-07-02 ENCOUNTER — Encounter: Payer: Self-pay | Admitting: Registered"

## 2017-07-02 ENCOUNTER — Encounter: Payer: 59 | Attending: General Surgery | Admitting: Registered"

## 2017-07-02 DIAGNOSIS — Z79899 Other long term (current) drug therapy: Secondary | ICD-10-CM | POA: Diagnosis not present

## 2017-07-02 DIAGNOSIS — Z713 Dietary counseling and surveillance: Secondary | ICD-10-CM | POA: Insufficient documentation

## 2017-07-02 DIAGNOSIS — E669 Obesity, unspecified: Secondary | ICD-10-CM

## 2017-07-02 DIAGNOSIS — Z886 Allergy status to analgesic agent status: Secondary | ICD-10-CM | POA: Insufficient documentation

## 2017-07-02 DIAGNOSIS — Z6841 Body Mass Index (BMI) 40.0 and over, adult: Secondary | ICD-10-CM | POA: Insufficient documentation

## 2017-07-02 LAB — IFOBT (OCCULT BLOOD): IMMUNOLOGICAL FECAL OCCULT BLOOD TEST: NEGATIVE

## 2017-07-02 NOTE — Progress Notes (Signed)
RYGB/Sleeve Gastrectomy Appt start time: 2:55 end time: 3:10  Assessment: 1st SWL Appointment.   Start Wt at NDES: 273.5 Wt: 268.6 BMI: 49.13   Pt arrives having lost 4.9 lbs from previous visit.   Pt states she is leaning towards wanting to have RYGB but wants to discuss with surgeon again. Pt has done an excellent jog with tracking her intake; has created notebook. Pt states she typically drinks at least 64 ounces of fluid a day; states weekends are more of a challenge.   Per insurance, pt needs 6 SWL visits prior to surgery.  MEDICATIONS: See list   DIETARY INTAKE:  24-hr recall:  B ( AM): string cheese  Snk ( AM): greek yogurt  L ( PM): mini ravioli Snk ( PM): pickeles D ( PM): cheese pizza Snk ( PM): apple Beverages: water, unsweetened tea (sometimes)  Usual physical activity: DVDs 3x/week, walking  Diet to Follow: 1800 calories 200 g carbohydrates 135 g protein 50 g fat  Preferred Learning Style:   No preference indicated   Learning Readiness:   Ready  Change in progress     Nutritional Diagnosis:  [place]-3.3 Overweight/obesity related to past poor dietary habits and physical inactivity as evidenced by patient w/ planned RYGB/sleeve gastrectomy surgery following dietary guidelines for continued weight loss.    Intervention:  Nutrition counseling for upcoming Bariatric Surgery.  Goals:  - Aim for 150 minutes of physical activity including cardio and weight bearing every week - Aim for at least 64 ounces of fluid on the weekends.  - Aim to chew at least 30 times per bite or to applesauce consistency.  - Continue with habits already established.   Teaching Method Utilized:  Visual Auditory Hands on  Handouts given during visit include:  none  Barriers to learning/adherence to lifestyle change: none identified  Demonstrated degree of understanding via:  Teach Back   Monitoring/Evaluation:  Dietary intake, exercise, and body weight in 1  month(s).

## 2017-07-02 NOTE — Patient Instructions (Signed)
-   Aim for at least 64 ounces of fluid on the weekends.   - Aim to chew at least 30 times per bite or to applesauce consistency.   - Continue with habits already established.

## 2017-07-05 NOTE — Progress Notes (Signed)
ifobt negative.  Looking back at her labs, trending towards iron deficiency, decline in MCV, decline in Hgb. EGD 2017/TCS last year.   Previously her celiac labs in 2011 were borderline.   Let's offer her repeat TTG IgA level. If it is negative, would offer her small bowel capsule study to evaluate small bowel that is not see via EGD/TCS.   NIC ofr CBC, iron/tibc, ferritin in 3 months.

## 2017-07-06 ENCOUNTER — Other Ambulatory Visit: Payer: Self-pay

## 2017-07-06 DIAGNOSIS — E611 Iron deficiency: Secondary | ICD-10-CM

## 2017-07-06 NOTE — Progress Notes (Signed)
Orders place for labs, pt notified of IFOBT results

## 2017-07-07 ENCOUNTER — Other Ambulatory Visit: Payer: Self-pay

## 2017-07-07 ENCOUNTER — Encounter: Payer: Self-pay | Admitting: Internal Medicine

## 2017-07-07 DIAGNOSIS — D649 Anemia, unspecified: Secondary | ICD-10-CM

## 2017-07-07 NOTE — Progress Notes (Signed)
PATIENT SCHEDULED FOR APPOINTMENT AND LETTER SENT

## 2017-07-07 NOTE — Addendum Note (Signed)
Addended by: Mahala Menghini on: 07/07/2017 11:18 AM   Modules accepted: Orders

## 2017-07-07 NOTE — Progress Notes (Signed)
PT is aware. OK to repeat TTG IGA.  Dawn Hayes had put in future labs. Forwarding to Micro to nic 3 month OV.

## 2017-07-08 ENCOUNTER — Ambulatory Visit: Payer: Commercial Managed Care - HMO | Admitting: Podiatry

## 2017-07-08 DIAGNOSIS — D649 Anemia, unspecified: Secondary | ICD-10-CM | POA: Diagnosis not present

## 2017-07-10 LAB — TISSUE TRANSGLUTAMINASE, IGA: (TTG) AB, IGA: 1 U/mL

## 2017-07-11 NOTE — Progress Notes (Signed)
TTG normal, makes celiac unlikely. Offer small bowel capsule study to complete GI work up of [place].

## 2017-07-13 DIAGNOSIS — L4050 Arthropathic psoriasis, unspecified: Secondary | ICD-10-CM | POA: Diagnosis not present

## 2017-07-14 ENCOUNTER — Other Ambulatory Visit: Payer: Self-pay

## 2017-07-14 DIAGNOSIS — L4050 Arthropathic psoriasis, unspecified: Secondary | ICD-10-CM | POA: Diagnosis not present

## 2017-07-14 DIAGNOSIS — D509 Iron deficiency anemia, unspecified: Secondary | ICD-10-CM

## 2017-07-14 DIAGNOSIS — M940 Chondrocostal junction syndrome [Tietze]: Secondary | ICD-10-CM | POA: Diagnosis not present

## 2017-07-14 DIAGNOSIS — L409 Psoriasis, unspecified: Secondary | ICD-10-CM | POA: Diagnosis not present

## 2017-07-21 ENCOUNTER — Ambulatory Visit: Payer: 59 | Admitting: Psychiatry

## 2017-07-21 ENCOUNTER — Encounter: Payer: Self-pay | Admitting: General Practice

## 2017-07-27 DIAGNOSIS — L4050 Arthropathic psoriasis, unspecified: Secondary | ICD-10-CM | POA: Diagnosis not present

## 2017-07-29 ENCOUNTER — Other Ambulatory Visit: Payer: Self-pay | Admitting: Family Medicine

## 2017-07-30 ENCOUNTER — Encounter: Payer: 59 | Attending: General Surgery | Admitting: Registered"

## 2017-07-30 ENCOUNTER — Encounter: Payer: Self-pay | Admitting: Registered"

## 2017-07-30 DIAGNOSIS — Z6841 Body Mass Index (BMI) 40.0 and over, adult: Secondary | ICD-10-CM | POA: Diagnosis not present

## 2017-07-30 DIAGNOSIS — Z79899 Other long term (current) drug therapy: Secondary | ICD-10-CM | POA: Insufficient documentation

## 2017-07-30 DIAGNOSIS — Z886 Allergy status to analgesic agent status: Secondary | ICD-10-CM | POA: Insufficient documentation

## 2017-07-30 DIAGNOSIS — Z713 Dietary counseling and surveillance: Secondary | ICD-10-CM | POA: Insufficient documentation

## 2017-07-30 DIAGNOSIS — E669 Obesity, unspecified: Secondary | ICD-10-CM

## 2017-07-30 NOTE — Progress Notes (Signed)
RYGB/Sleeve Gastrectomy Appt start time: 2:55 end time: 3:13  Assessment: 2nd SWL Appointment.   Start Wt at NDES: 273.5 Wt: 275.2 BMI: 50.33   Pt arrives having gained 6.6 lbs from previous visit.   Pt states she has been on a high dosage of prednisone lately; increased appetite. Pt states she is leaning towards wanting to have RYGB but wants to discuss with surgeon again. Pt states she has increased fluid intake on weekends by adding in extra 16 oz bottle of water; ~48 oz. Pt tracks food and drink intake in personal notebook.   Per insurance, pt needs 6 SWL visits prior to surgery.  MEDICATIONS: See list   DIETARY INTAKE:  24-hr recall:  B ( AM): string cheese or apple Snk ( AM): greek yogurt or fritos L ( PM): mini ravioli or meatloaf Snk ( PM): pickles or dark chocolate D ( PM): cheese pizza  Snk ( PM): sometimes fruit Beverages: water, unsweetened tea (sometimes), red wine (occasionally)  Usual physical activity: DVDs 3x/week, walking  Diet to Follow: 1800 calories 200 g carbohydrates 135 g protein 50 g fat  Preferred Learning Style:   No preference indicated   Learning Readiness:   Ready  Change in progress     Nutritional Diagnosis:  [place]-3.3 Overweight/obesity related to past poor dietary habits and physical inactivity as evidenced by patient w/ planned RYGB/sleeve gastrectomy surgery following dietary guidelines for continued weight loss.    Intervention:  Nutrition counseling for upcoming Bariatric Surgery.  Goals:  - Aim for 150 minutes of physical activity including cardio and weight bearing every week - Find 2-3 flavors of protein shakes/drinks/powders that meet protein and carbohydrate criteria.  - Aim to eat more well-balanced meals; increasing vegetable intake.  - Add in another water on weekends; aiming for at least 4 16-oz bottles.  - Continue to work on chewing well. established.   Teaching Method Utilized:  Visual Auditory Hands  on  Handouts given during visit include:  none  Barriers to learning/adherence to lifestyle change: none identified  Demonstrated degree of understanding via:  Teach Back   Monitoring/Evaluation:  Dietary intake, exercise, and body weight in 1 month(s).

## 2017-07-30 NOTE — Patient Instructions (Addendum)
-   Find 2-3 flavors of protein shakes/drinks/powders that meet protein and carbohydrate criteria.   - Aim to eat more well-balanced meals; increasing vegetable intake.   - Add in another water on weekends; aiming for at least 4 16-oz bottles.   - Continue to work on chewing well.

## 2017-08-10 ENCOUNTER — Encounter: Payer: Self-pay | Admitting: Internal Medicine

## 2017-08-10 DIAGNOSIS — L4050 Arthropathic psoriasis, unspecified: Secondary | ICD-10-CM | POA: Diagnosis not present

## 2017-08-11 DIAGNOSIS — L409 Psoriasis, unspecified: Secondary | ICD-10-CM | POA: Diagnosis not present

## 2017-08-11 DIAGNOSIS — L4050 Arthropathic psoriasis, unspecified: Secondary | ICD-10-CM | POA: Diagnosis not present

## 2017-08-11 DIAGNOSIS — M940 Chondrocostal junction syndrome [Tietze]: Secondary | ICD-10-CM | POA: Diagnosis not present

## 2017-08-12 ENCOUNTER — Other Ambulatory Visit: Payer: Self-pay | Admitting: Internal Medicine

## 2017-08-12 DIAGNOSIS — M940 Chondrocostal junction syndrome [Tietze]: Secondary | ICD-10-CM

## 2017-08-14 ENCOUNTER — Encounter (HOSPITAL_COMMUNITY)
Admission: RE | Disposition: A | Discharge status: Home / Self Care | Payer: Self-pay | Source: Ambulatory Visit | Attending: Internal Medicine

## 2017-08-14 ENCOUNTER — Other Ambulatory Visit: Payer: Self-pay

## 2017-08-14 ENCOUNTER — Ambulatory Visit (HOSPITAL_COMMUNITY)
Admission: RE | Admit: 2017-08-14 | Discharge: 2017-08-14 | Disposition: A | Discharge status: Home / Self Care | Payer: 59 | Source: Ambulatory Visit | Attending: Internal Medicine | Admitting: Internal Medicine

## 2017-08-14 DIAGNOSIS — D509 Iron deficiency anemia, unspecified: Secondary | ICD-10-CM | POA: Diagnosis present

## 2017-08-14 DIAGNOSIS — D508 Other iron deficiency anemias: Secondary | ICD-10-CM | POA: Diagnosis not present

## 2017-08-14 DIAGNOSIS — E611 Iron deficiency: Secondary | ICD-10-CM

## 2017-08-14 DIAGNOSIS — Z8719 Personal history of other diseases of the digestive system: Secondary | ICD-10-CM | POA: Diagnosis not present

## 2017-08-14 DIAGNOSIS — Z86010 Personal history of colonic polyps: Secondary | ICD-10-CM | POA: Insufficient documentation

## 2017-08-14 HISTORY — PX: GIVENS CAPSULE STUDY: SHX5432

## 2017-08-14 SURGERY — IMAGING PROCEDURE, GI TRACT, INTRALUMINAL, VIA CAPSULE

## 2017-08-18 ENCOUNTER — Other Ambulatory Visit: Payer: Self-pay | Admitting: Family Medicine

## 2017-08-18 ENCOUNTER — Encounter: Payer: Self-pay | Admitting: General Practice

## 2017-08-18 ENCOUNTER — Other Ambulatory Visit: Payer: Self-pay | Admitting: General Practice

## 2017-08-18 MED ORDER — HYDROCHLOROTHIAZIDE 25 MG PO TABS: 25.0000 mg | ORAL_TABLET | Freq: Every day | ORAL | 2 refills | Status: DC

## 2017-08-18 NOTE — Telephone Encounter (Signed)
Tried calling pt, could not leave a message. mychart message sent. Med filled to local pharmacy.

## 2017-08-18 NOTE — Telephone Encounter (Signed)
Ok to refill, pt needs to schedule CPE

## 2017-08-18 NOTE — Telephone Encounter (Signed)
Last OV 06/16/2017 for Left Ankle Sprain, No future appointment scheduled  Last filled 05/21/17, #30 w/2 refills with message to please call [phone] to schedule a Blood Pressure follow up.  Just want to check to see if refuse since Lisinopril was refused?  Please advise.

## 2017-08-19 ENCOUNTER — Encounter (HOSPITAL_COMMUNITY): Payer: Self-pay | Admitting: Internal Medicine

## 2017-08-20 ENCOUNTER — Telehealth: Payer: Self-pay | Admitting: Gastroenterology

## 2017-08-20 NOTE — Telephone Encounter (Signed)
Please let patient know that I am still reviewing her capsule. I don't see anything overtly bleeding. There are a few areas that look to be possible little erosions but nothing concerning for ulceration or IBD or anything like that. A few images look to be possible normal variants, but I am reviewing this further. Complete note available tomorrow with final recommendations.

## 2017-08-20 NOTE — Telephone Encounter (Signed)
Noted, pt notified of results.

## 2017-08-21 NOTE — Procedures (Addendum)
Small Bowel [place] Capsule Study Procedure date:  08/14/17   Referring Provider:  Neil Crouch, PA/Dr, Shatonya Passon  PCP:  Dr. Midge Minium, MD  Indication for procedure:  History of chronic anemia, Hgb in 12-13 range last year, with most recent Hgb 10 range. Ferritin low at 12, iron low at 31. Colonoscopy 2017 with left-sided diverticula and hemorrhoids. EGD demonstarted hiatal hernia Jan 2017. Capsule study to conclude evaluation for IDA.      Findings: Capsule was complete to the cecum. There were several images with small hyperplastic, benign-appearing polyps in the stomach. Possible few proximal small bowel superficial erosions but no ulcerations. No obvious mass, tumors, strictures, AVMs, sources for occult blood loss. Pertinent images saved in system and reviewed with attending.   First Gastric image:  00:00:15 First Duodenal image: 00:06:27 First Cecal image: 02:30:01 Gastric Passage time: 0h 57m Small Bowel Passage time:  2h 40m  Summary & Recommendations: 47 year old female with IDA, s/p colonoscopy and EGD in 2017 unrevealing, and most recently capsule study without concerning findings. She is heme negative, and repeat CBC, iron, TIBC, ferritin ordered for May 2019. Would limit NSAIDs, continue PPI. May need referral to Hematology for long-term follow-up if IDA worsens. I note she is also under evaluation for bariatric surgery for sleeve gastrectomy, possible Roux-en-Y gastric bypass, which by nature increases potential for iron deficiency in the future. Continue to follow labs serially, continue ferrous sulfate 325 mg po BID. May benefit from [facility] or [facility] in the future if oral iron is not adequate after review of May 2019 labs. Follow-up in office in 3 months.   Dawn Needs, PhD, ANP-BC [facility]   Attending note:  Pertinent images reviewed. Agree with above assessment and recommendations.

## 2017-08-24 ENCOUNTER — Other Ambulatory Visit: Payer: Self-pay | Admitting: Family Medicine

## 2017-08-25 ENCOUNTER — Emergency Department (HOSPITAL_COMMUNITY): Payer: 59

## 2017-08-25 ENCOUNTER — Emergency Department (HOSPITAL_COMMUNITY)
Admission: EM | Admit: 2017-08-25 | Discharge: 2017-08-26 | Disposition: A | Discharge status: Home / Self Care | Payer: 59 | Attending: Emergency Medicine | Admitting: Emergency Medicine

## 2017-08-25 ENCOUNTER — Telehealth: Payer: Self-pay | Admitting: Gastroenterology

## 2017-08-25 ENCOUNTER — Other Ambulatory Visit: Payer: Self-pay

## 2017-08-25 ENCOUNTER — Encounter (HOSPITAL_COMMUNITY): Payer: Self-pay | Admitting: Emergency Medicine

## 2017-08-25 DIAGNOSIS — I10 Essential (primary) hypertension: Secondary | ICD-10-CM | POA: Insufficient documentation

## 2017-08-25 DIAGNOSIS — Z87891 Personal history of nicotine dependence: Secondary | ICD-10-CM | POA: Diagnosis not present

## 2017-08-25 DIAGNOSIS — R1030 Lower abdominal pain, unspecified: Secondary | ICD-10-CM | POA: Diagnosis not present

## 2017-08-25 DIAGNOSIS — R102 Pelvic and perineal pain: Secondary | ICD-10-CM

## 2017-08-25 DIAGNOSIS — Z79899 Other long term (current) drug therapy: Secondary | ICD-10-CM | POA: Insufficient documentation

## 2017-08-25 DIAGNOSIS — R1032 Left lower quadrant pain: Secondary | ICD-10-CM

## 2017-08-25 DIAGNOSIS — N83202 Unspecified ovarian cyst, left side: Secondary | ICD-10-CM | POA: Diagnosis not present

## 2017-08-25 LAB — LIPASE, BLOOD: Lipase: 31 U/L (ref 11–51)

## 2017-08-25 LAB — COMPREHENSIVE METABOLIC PANEL
ALBUMIN: 3.6 g/dL (ref 3.5–5.0)
ALK PHOS: 59 U/L (ref 38–126)
ALT: 24 U/L (ref 14–54)
ANION GAP: 11 (ref 5–15)
AST: 19 U/L (ref 15–41)
BUN: 20 mg/dL (ref 6–20)
CALCIUM: 9.3 mg/dL (ref 8.9–10.3)
CO2: 24 mmol/L (ref 22–32)
Chloride: 102 mmol/L (ref 101–111)
Creatinine, Ser: 0.69 mg/dL (ref 0.44–1.00)
GFR calc Af Amer: >60 mL/min (ref >60)
GFR calc non Af Amer: >60 mL/min (ref >60)
GLUCOSE: 82 mg/dL (ref 65–99)
POTASSIUM: 3.7 mmol/L (ref 3.5–5.1)
SODIUM: 137 mmol/L (ref 135–145)
Total Bilirubin: 0.3 mg/dL (ref 0.3–1.2)
Total Protein: 7.2 g/dL (ref 6.5–8.1)

## 2017-08-25 LAB — URINALYSIS, ROUTINE W REFLEX MICROSCOPIC
Bilirubin Urine: NEGATIVE
Glucose, UA: NEGATIVE mg/dL
HGB URINE DIPSTICK: NEGATIVE
Ketones, ur: NEGATIVE mg/dL
Leukocytes, UA: NEGATIVE
Nitrite: NEGATIVE
PH: 6 (ref 5.0–8.0)
Protein, ur: NEGATIVE mg/dL
SPECIFIC GRAVITY, URINE: 1.027 (ref 1.005–1.030)

## 2017-08-25 LAB — CBC
HEMATOCRIT: 41.7 % (ref 36.0–46.0)
HEMOGLOBIN: 13.6 g/dL (ref 12.0–15.0)
MCH: 29.4 pg (ref 26.0–34.0)
MCHC: 32.6 g/dL (ref 30.0–36.0)
MCV: 90.1 fL (ref 78.0–100.0)
Platelets: 349 10*3/uL (ref 150–400)
RBC: 4.63 MIL/uL (ref 3.87–5.11)
RDW: 12.5 % (ref 11.5–15.5)
WBC: 9.6 10*3/uL (ref 4.0–10.5)

## 2017-08-25 MED ORDER — FENTANYL CITRATE (PF) 100 MCG/2ML IJ SOLN
50.0000 ug | Freq: Once | INTRAMUSCULAR | Status: AC
  Administered 2017-08-25: 50 ug via INTRAVENOUS
  Filled 2017-08-25: qty 2

## 2017-08-25 MED ORDER — HYDROCODONE-ACETAMINOPHEN 5-325 MG PO TABS: 1.0000 | ORAL_TABLET | Freq: Four times a day (QID) | ORAL | 0 refills | Status: DC | PRN

## 2017-08-25 MED ORDER — IOPAMIDOL (ISOVUE-300) INJECTION 61%
100.0000 mL | Freq: Once | INTRAVENOUS | Status: AC | PRN
  Administered 2017-08-25: 100 mL via INTRAVENOUS

## 2017-08-25 MED ORDER — SODIUM CHLORIDE 0.9 % IV SOLN
INTRAVENOUS | Status: DC
  Administered 2017-08-25: 20:00:00 via INTRAVENOUS

## 2017-08-25 MED ORDER — HYDROMORPHONE HCL 1 MG/ML IJ SOLN
1.0000 mg | Freq: Once | INTRAMUSCULAR | Status: AC
  Administered 2017-08-25: 1 mg via INTRAVENOUS
  Filled 2017-08-25: qty 1

## 2017-08-25 NOTE — ED Provider Notes (Signed)
Blood pressure 124/83, pulse 99, temperature 97.9 F (36.6 C), resp. rate 18, height 5\' 2"  (1.575 m), weight 124.7 kg (275 lb), last menstrual period 02/28/2016, SpO2 100 %.  Assuming care from Dr. Sabra Heck.  In short, Dawn Hayes is a 47 y.o. female with a chief complaint of Abdominal Pain .  Refer to the original H&P for additional details.  The current plan of care is to follow TVUS and reassess.  11:40 PM TVUS negative for torsion. Patient will f/u with OB regarding the pain. Reviewed the [place] Narcotic Database and provide a very short course of Vicodin for pain. Patient on Celebrex so cannot take Motrin 800 mg. Reports a Tramadol intolerance.   At this time, I do not feel there is any life-threatening condition present. I have reviewed and discussed all results (EKG, imaging, lab, urine as appropriate), exam findings with patient. I have reviewed nursing notes and appropriate previous records.  I feel the patient is safe to be discharged home without further emergent workup. Discussed usual and customary return precautions. Patient and family (if present) verbalize understanding and are comfortable with this plan.  Patient will follow-up with their primary care provider. If they do not have a primary care provider, information for follow-up has been provided to them. All questions have been answered.  Nanda Quinton, MD   Margette Fast, MD 08/26/17 1351

## 2017-08-25 NOTE — Discharge Instructions (Signed)
You have been seen in the Emergency Department (ED) for abdominal pain.  Your evaluation showed an ovarian cyst on the left which is likely causing your pain. Call your OB tomorrow morning to schedule follow up.   Please follow up as instructed above regarding today?s emergent visit and the symptoms that are bothering you.  Return to the ED if your abdominal pain worsens or fails to improve, you develop bloody vomiting, bloody diarrhea, you are unable to tolerate fluids due to vomiting, fever greater than 101, or other symptoms that concern you.

## 2017-08-25 NOTE — Telephone Encounter (Signed)
Pt.notified

## 2017-08-25 NOTE — ED Triage Notes (Signed)
Pt c/o LLQ abd pain starting this afternoon, denies any other other sx at this time. LBM was this afternoon, did not decrease pain. Denies GU sx. Pt recently had a camera colonoscopy, has not gotten results yet.

## 2017-08-25 NOTE — ED Notes (Signed)
Pt requesting additional pain meds, edp aware.

## 2017-08-25 NOTE — Telephone Encounter (Signed)
    Dawn Hayes: we already spoke with patient last week, but I just wanted to let her know to limit NSAIDs, keep plans for labs in May, keep appt with Magda Paganini in June. Continue oral iron BID. She may benefit from IV iron at some point, but that remains to be seen. Continue Nexium if on NSAIDs. Can decrease if asymptomatic from GERD standpoint and not on NSAIDs.

## 2017-08-25 NOTE — ED Provider Notes (Signed)
[facility] EMERGENCY DEPARTMENT Provider Note   CSN: [id number] Arrival date & time: 08/25/17  1853     History   Chief Complaint Chief Complaint  Patient presents with  . Abdominal Pain    HPI Dawn Hayes is a 47 y.o. female.  HPI  47 year old female, the patient is an obese female with a chronic history of psoriasis, psoriatic arthritis, has a history of several episodes of diverticulitis.  She has had a prior cholecystectomy and a prior hysterectomy but still has her ovaries.  Presents with chief complaint of left-sided abdominal pain.  Abdominal pain started today at 3:00, it is been present for approximately 4-1/2 hours constantly, seems to get worse with palpation, no associated vomiting or rectal bleeding, she did recently have an outpatient study with a capsule endoscopy, searching for a source of chronic anemia.  She no longer gets iron transfusions but did in the past.  She is on chronic prednisone therapy for her psoriatic arthritis.  At this time the pain is fairly severe.  No pain medication prehospital.  Past Medical History:  Diagnosis Date  . Anemia   . Arthritis   . Costochondritis   . Diverticulitis   . Dysmenorrhea   . GERD (gastroesophageal reflux disease)   . Headache    Migraines  . History of hiatal hernia   . Hypertension   . Menorrhagia   . Psoriatic arthritis ([place])   . Smoker     Patient Active Problem List   Diagnosis Date Noted  . Anxiety and depression 05/27/2016  . Status post total abdominal hysterectomy 04/08/2016  . Costochondritis 12/04/2015  . Anemia, iron deficiency 10/10/2015  . Tick bites 10/10/2015  . Fatigue 10/10/2015  . Allergic rhinitis 09/07/2015  . Hemorrhoid   . Diverticulosis of colon without hemorrhage   . Hiatal hernia   . Anemia 05/08/2015  . Rectal bleeding 05/08/2015  . Chest pain, rule out acute myocardial infarction 09/27/2013  . Abnormal finding on EKG 09/16/2013  . Psoriasis 09/16/2013  .  Psoriatic arthritis ([place]) 09/16/2013  . HTN (hypertension) 09/16/2013  . Morbid obesity, unspecified obesity type ([place]) 09/16/2013  . GERD 12/11/2009  . DIARRHEA, CHRONIC 12/11/2009    Past Surgical History:  Procedure Laterality Date  . ABDOMINAL HYSTERECTOMY    . CARPAL TUNNEL RELEASE Left 01/19/2015   Procedure: LEFT CARPAL TUNNEL RELEASE;  Surgeon: Roseanne Kaufman, MD;  Location: [facility];  Service: Orthopedics;  Laterality: Left;  . CHOLECYSTECTOMY    . CHOLECYSTECTOMY, LAPAROSCOPIC  2006  . COLONOSCOPY  1998   normal, [place]  . COLONOSCOPY N/A 05/30/2015   RMR: Minimal anal canal hemorrhoids. colonic diverticulosis. I suspect relatively trivial recent gI Bleed likely related to hemorrhoids. This finding alone would not likely adequatly explain her degree of anemia. she likely has a significent component from menstrual losses. , etc.   . COLONOSCOPY WITH PROPOFOL N/A 03/09/2017   Dr. Gala Romney: scattered diverticula, distal 5cm of TI normal. Grade II non-bleeding internal hemorrhoids. next TCS 10 years.   . CYSTOSCOPY N/A 04/08/2016   Procedure: CYSTOSCOPY;  Surgeon: Nunzio Cobbs, MD;  Location: [facility];  Service: Gynecology;  Laterality: N/A;  . DILATATION & CURETTAGE/HYSTEROSCOPY WITH MYOSURE N/A 01/04/2016   Procedure: DILATATION & CURETTAGE/HYSTEROSCOPY;  Surgeon: Nunzio Cobbs, MD;  Location: [facility];  Service: Gynecology;  Laterality: N/A;  . ESOPHAGOGASTRODUODENOSCOPY  09/11/99   tiny esophageal erosions with mild erosive reflux/no barrett's/normal stomach  . ESOPHAGOGASTRODUODENOSCOPY N/A  05/30/2015   RMR: Hiatal hernia otherwise negative EGD  . GIVENS CAPSULE STUDY N/A 08/14/2017   Procedure: GIVENS CAPSULE STUDY;  Surgeon: Daneil Dolin, MD;  [facility];  Service: Endoscopy;  Laterality: N/A;  7:30am  . HYSTERECTOMY ABDOMINAL WITH SALPINGECTOMY Bilateral 04/08/2016   Procedure: HYSTERECTOMY ABDOMINAL WITH SALPINGECTOMY;   Surgeon: Nunzio Cobbs, MD;  Location: [facility];  Service: Gynecology;  Laterality: Bilateral;  . LAPAROSCOPIC BILATERAL SALPINGECTOMY N/A 04/08/2016   Procedure: LAPAROSCOPIC BILATERAL SALPINGECTOMY possible BSO;  Surgeon: Nunzio Cobbs, MD;  Location: [facility];  Service: Gynecology;  Laterality: N/A;  . LAPAROSCOPIC HYSTERECTOMY N/A 04/08/2016   Procedure: ATTEMPTED HYSTERECTOMY TOTAL LAPAROSCOPIC;  Surgeon: Nunzio Cobbs, MD;  Location: [facility];  Service: Gynecology;  Laterality: N/A;  . LAPAROSCOPIC LYSIS OF ADHESIONS  04/08/2016   Procedure: LAPAROSCOPIC LYSIS OF ADHESIONS;  Surgeon: Nunzio Cobbs, MD;  Location: [facility];  Service: Gynecology;;     OB History    Gravida  0   Para      Term      Preterm      AB      Living        SAB      TAB      Ectopic      Multiple      Live Births               Home Medications    Prior to Admission medications   Medication Sig Start Date End Date Taking? Authorizing Provider  celecoxib (CELEBREX) 200 MG capsule Take 200 mg by mouth daily. 08/12/17  Yes [provider]  Certolizumab Pegol (CIMZIA ) Inject into the skin. Every other week   Yes [provider]  esomeprazole (NEXIUM) 40 MG capsule TAKE 1 CAPSULE(40 MG) BY MOUTH DAILY BEFORE BREAKFAST 06/03/17  Yes Annitta Needs, NP  hydrochlorothiazide (HYDRODIURIL) 25 MG tablet Take 1 tablet (25 mg total) by mouth daily. Please call [phone] to schedule a Blood Pressure follow up. 08/18/17  Yes Midge Minium, MD  HYDROcodone-acetaminophen (NORCO/VICODIN) 5-325 MG tablet Take 2 tablets by mouth every 4 (four) hours as needed. 06/09/17  Yes Noemi Chapel, MD  hydrocortisone (ANUSOL-HC) 2.5 % rectal cream Place 1 application rectally as needed for hemorrhoids or anal itching.   Yes [provider]  lactobacillus acidophilus (BACID) TABS tablet Take 1 tablet by mouth daily.   Yes [provider]    lisinopril (PRINIVIL,ZESTRIL) 10 MG tablet Take 1 tablet (10 mg total) by mouth daily. NO further refills without a Blood Pressure appt. 06/26/17  Yes Midge Minium, MD  Multiple Vitamin (MULTIVITAMIN WITH MINERALS) TABS tablet Take 1 tablet by mouth daily.   Yes [provider]  nystatin (MYCOSTATIN) 100000 UNIT/ML suspension SWISH AND SPIT 5 ML BY MOUTH FOUR TIMES DAILY AS NEEDED FOR THRUSH 04/14/17  Yes Midge Minium, MD  predniSONE (DELTASONE) 20 MG tablet Take 2 tablets (40 mg total) by mouth daily. Patient taking differently: Take 10 mg by mouth daily.  06/09/17  Yes Noemi Chapel, MD  traMADol (ULTRAM) 50 MG tablet Take 1 tablet (50 mg total) by mouth every 6 (six) hours as needed. 03/05/17  Yes Tanna Furry, MD  triamcinolone cream (KENALOG) 0.1 % Apply 1 application topically daily as needed (for irritation).  08/24/17  Yes [provider]  varenicline (CHANTIX CONTINUING MONTH PAK) 1 MG tablet Take 1 tablet (  1 mg total) by mouth 2 (two) times daily. 06/19/17  Yes Midge Minium, MD  XIIDRA 5 % SOLN Instill 1 drop into both eyes two times a day 10/10/16  Yes [provider]    Family History Family History  Problem Relation Age of Onset  . Brain cancer Father   . Prostate cancer Father   . Hyperlipidemia Mother   . Colon cancer Maternal Grandmother 39  . Stroke Paternal Grandfather        COD  . Aneurysm Maternal Grandfather   . Cancer Other   . Hypertension Other     Social History Social History   Tobacco Use  . Smoking status: Former Smoker    Packs/day: 0.10    Years: 20.00    Pack years: 2.00    Types: Cigarettes  . Smokeless tobacco: Never Used  . Tobacco comment: smoking 5 cigarettes a day, hasn't quit completely   Substance Use Topics  . Alcohol use: Yes    Alcohol/week: 9.6 oz    Types: 6 Glasses of wine, 10 Standard drinks or equivalent per week    Comment: socially, weekends 4-5  . Drug use: No     Allergies    Aspirin; Toradol [ketorolac tromethamine]; and Adhesive [tape]   Review of Systems Review of Systems  All other systems reviewed and are negative.    Physical Exam Updated Vital Signs BP (!) 133/95 (BP Location: Right Arm)   Pulse 98   Temp 98.4 F (36.9 C) (Oral)   Resp 16   Ht 5\' 2"  (1.575 m)   Wt 124.7 kg (275 lb)   LMP 02/28/2016   SpO2 99%   BMI 50.30 kg/m   Physical Exam  Constitutional: She appears well-developed and well-nourished. No distress.  HENT:  Head: Normocephalic and atraumatic.  Mouth/Throat: Oropharynx is clear and moist. No oropharyngeal exudate.  Eyes: Pupils are equal, round, and reactive to light. Conjunctivae and EOM are normal. Right eye exhibits no discharge. Left eye exhibits no discharge. No scleral icterus.  Neck: Normal range of motion. Neck supple. No JVD present. No thyromegaly present.  Cardiovascular: Normal rate, regular rhythm, normal heart sounds and intact distal pulses. Exam reveals no gallop and no friction rub.  No murmur heard. Pulmonary/Chest: Effort normal and breath sounds normal. No respiratory distress. She has no wheezes. She has no rales.  Abdominal: Soft. Bowel sounds are normal. She exhibits no distension and no mass. There is tenderness ( Left lower quadrant and left mid abdominal tenderness with mild guarding, no pain on the right side, the patient is morbidly obese with an overall very soft abdomen without distention.).  Musculoskeletal: Normal range of motion. She exhibits no edema or tenderness.  Lymphadenopathy:    She has no cervical adenopathy.  Neurological: She is alert. Coordination normal.  Skin: Skin is warm and dry. No rash noted. No erythema.  Psychiatric: She has a normal mood and affect. Her behavior is normal.  Nursing note and vitals reviewed.    ED Treatments / Results  Labs (all labs ordered are listed, but only abnormal results are displayed) Labs Reviewed  URINALYSIS, ROUTINE W REFLEX  MICROSCOPIC - Abnormal; Notable for the following components:      Result Value   APPearance HAZY (*)    All other components within normal limits  LIPASE, BLOOD  COMPREHENSIVE METABOLIC PANEL  CBC    EKG None  Radiology Ct Abdomen Pelvis W Contrast  Result Date: 08/25/2017 CLINICAL DATA:  Left  lower quadrant pain EXAM: CT ABDOMEN AND PELVIS WITH CONTRAST TECHNIQUE: Multidetector CT imaging of the abdomen and pelvis was performed using the standard protocol following bolus administration of intravenous contrast. CONTRAST:  182mL ISOVUE-300 IOPAMIDOL (ISOVUE-300) INJECTION 61% COMPARISON:  03/05/2017 FINDINGS: Lower chest: Lung bases show no acute consolidation or effusion. Normal heart size. Hepatobiliary: No focal liver abnormality is seen. Status post cholecystectomy. No biliary dilatation. Pancreas: Unremarkable. No pancreatic ductal dilatation or surrounding inflammatory changes. Spleen: Normal in size without focal abnormality. Adrenals/Urinary Tract: Adrenal glands are unremarkable. Kidneys are normal, without renal calculi, focal lesion, or hydronephrosis. Bladder is unremarkable. Stomach/Bowel: Stomach is within normal limits. Appendix appears normal. No evidence of bowel wall thickening, distention, or inflammatory changes. Vascular/Lymphatic: Mild aortic atherosclerosis. No aneurysmal dilatation. No significantly enlarged lymph nodes. Reproductive: Status post hysterectomy. Enlarged appearing left ovary with 4.1 cm low-density lesion. Trace left adnexal fluid and mild stranding in the region. Other: Negative for free air Musculoskeletal: No acute or significant osseous findings. IMPRESSION: 1. Status post hysterectomy. Prominent appearing left ovary with 4.1 cm low attenuation lesion, possible complicated cyst. There is a small amount of left adnexal fluid. Given history of left lower quadrant pain, suggest correlation with pelvic ultrasound and Doppler. 2. There are otherwise no acute  abnormalities visualized. Electronically Signed   By: Donavan Foil M.D.   On: 08/25/2017 21:53    Procedures Procedures (including critical care time)  Medications Ordered in ED Medications  0.9 %  sodium chloride infusion ( Intravenous New Bag/Given 08/25/17 2006)  HYDROmorphone (DILAUDID) injection 1 mg (has no administration in time range)  fentaNYL (SUBLIMAZE) injection 50 mcg (50 mcg Intravenous Given 08/25/17 2005)  iopamidol (ISOVUE-300) 61 % injection 100 mL (100 mLs Intravenous Contrast Given 08/25/17 2113)     Initial Impression / Assessment and Plan / ED Course  I have reviewed the triage vital signs and the nursing notes.  Pertinent labs & imaging results that were available during my care of the patient were reviewed by me and considered in my medical decision making (see chart for details).    The exam was unremarkable except for left-sided abdominal tenderness which raises the suspicion for acute diverticulitis.  The patient has had a gastrointestinal evaluation for possible sources of bleeding but not with the pain that she is having at this time.  With prior episodes of diverticulitis, and her statement that she feels like this is the worst pain she is ever had if it is diverticulitis we will proceed with imaging of her abdomen and pelvis to rule out potential complications including perforation or abscess.  Pain medication and IV fluids have been ordered.  Labs ordered.  Patient agreeable to the plan  Discussed with Dr. Glo Herring after CT scan showed adnexal mass likely ovarian cyst, I would consider torsion given the patient's symptomatology onset and ongoing colicky severe pain.  Has been redosed with Dilaudid, Dr. Glo Herring agrees to help with the patient as needed but ultrasound will be called in for a formal torsion study.  At change of shift, care signed out to oncoming emergency department physician.  10:31 PM   Final Clinical Impressions(s) / ED Diagnoses   Final  diagnoses:  Pelvic pain    ED Discharge Orders    None       Noemi Chapel, MD 08/25/17 2232

## 2017-08-26 ENCOUNTER — Other Ambulatory Visit: Payer: Self-pay | Admitting: *Deleted

## 2017-08-26 ENCOUNTER — Telehealth: Payer: Self-pay | Admitting: Family Medicine

## 2017-08-26 MED ORDER — LISINOPRIL 10 MG PO TABS: 10.0000 mg | ORAL_TABLET | Freq: Every day | ORAL | 0 refills | Status: DC

## 2017-08-26 NOTE — Telephone Encounter (Signed)
Rx for #15 sent to pharmacy- to extend patient per protocol.

## 2017-08-26 NOTE — Telephone Encounter (Signed)
Copied from [place]. Topic: Quick Communication - Rx Refill/Question >> Aug 26, 2017 11:04 AM Boyd Kerbs wrote: Medication: lisinopril (PRINIVIL,ZESTRIL) 10 MG tablet  Pt. Has appt. 5/2 for medication check but is out and needs medication until then.   Has the patient contacted their pharmacy? Yes.   (Agent: If no, request that the patient contact the pharmacy for the refill.) Preferred Pharmacy (with phone number or street name):   [facility], [place] - [address] [place] [address] [place] [address] Phone: [phone] Fax: [phone]   Agent: Please be advised that RX refills may take up to 3 business days. We ask that you follow-up with your pharmacy.

## 2017-09-01 ENCOUNTER — Encounter: Payer: Self-pay | Admitting: Registered"

## 2017-09-01 ENCOUNTER — Encounter: Payer: 59 | Attending: General Surgery | Admitting: Registered"

## 2017-09-01 ENCOUNTER — Ambulatory Visit (INDEPENDENT_AMBULATORY_CARE_PROVIDER_SITE_OTHER): Payer: 59 | Admitting: Psychiatry

## 2017-09-01 DIAGNOSIS — Z886 Allergy status to analgesic agent status: Secondary | ICD-10-CM | POA: Insufficient documentation

## 2017-09-01 DIAGNOSIS — Z713 Dietary counseling and surveillance: Secondary | ICD-10-CM | POA: Insufficient documentation

## 2017-09-01 DIAGNOSIS — E669 Obesity, unspecified: Secondary | ICD-10-CM

## 2017-09-01 DIAGNOSIS — F509 Eating disorder, unspecified: Secondary | ICD-10-CM | POA: Diagnosis not present

## 2017-09-01 DIAGNOSIS — Z79899 Other long term (current) drug therapy: Secondary | ICD-10-CM | POA: Diagnosis not present

## 2017-09-01 DIAGNOSIS — Z6841 Body Mass Index (BMI) 40.0 and over, adult: Secondary | ICD-10-CM | POA: Diagnosis not present

## 2017-09-01 NOTE — Patient Instructions (Signed)
-   Aim to practice not drinking 15 minutes before eating, not while eating, and waiting 30 minutes after eating to drink.   - Continue with the great habits you've established!

## 2017-09-01 NOTE — Progress Notes (Signed)
RYGB/Sleeve Gastrectomy Appt start time: 11:53 end time: 12:08  Assessment: 3rd SWL Appointment.   Start Wt at NDES: 273.5 Wt: 278.8 BMI: 50.99   Pt arrives having gained about 3.6 lbs from previous visit.   Pt states she likes the [employer] protein shakes (30g protein-chocolate and vanilla) and can tolerate Premier Protein as well as Atkins shakes. Pt states she has been able to have more vegetables with lunch and dinner such as peas, green beans, and salad. Pt states cauliflower and broccoli are not her favorite but snacks on them at times. Pt states she has been able to be consistent with drinking more water on weekends; consistently exceeding 64 ounces of fluid a day. Pt states she also keeps water bottle on nightstand to sip when waking up at night.   Pt states she has been on a high dosage of prednisone lately; increased appetite. Pt states she is leaning towards wanting to have RYGB but wants to discuss with surgeon again. Pt tracks food and drink intake in personal notebook.   Per insurance, pt needs 6 SWL visits prior to surgery.  MEDICATIONS: See list   DIETARY INTAKE:  24-hr recall:  B ( AM): string cheese or apple Snk ( AM): greek yogurt or fritos L ( PM): mini ravioli or meatloaf, salad Snk ( PM): pickles or dark chocolate or tomatoes, cucumbers D ( PM): cheese pizza, vegetable medley Snk ( PM): sometimes fruit Beverages: water, unsweetened tea (sometimes), red wine (occasionally)  Usual physical activity: DVDs 3x/week, walking  Diet to Follow: 1800 calories 200 g carbohydrates 135 g protein 50 g fat  Preferred Learning Style:   No preference indicated   Learning Readiness:   Ready  Change in progress     Nutritional Diagnosis:  [place]-3.3 Overweight/obesity related to past poor dietary habits and physical inactivity as evidenced by patient w/ planned RYGB/sleeve gastrectomy surgery following dietary guidelines for continued weight loss.    Intervention:   Nutrition counseling for upcoming Bariatric Surgery.  Goals:  - Aim for 150 minutes of physical activity including cardio and weight bearing every week - Aim to practice not drinking 15 minutes before eating, not while eating, and waiting 30 minutes after eating to drink.  - Continue with the great habits you've established!    Teaching Method Utilized:  Visual Auditory Hands on  Handouts given during visit include:  none  Barriers to learning/adherence to lifestyle change: none identified  Demonstrated degree of understanding via:  Teach Back   Monitoring/Evaluation:  Dietary intake, exercise, and body weight in 1 month(s).

## 2017-09-02 DIAGNOSIS — E611 Iron deficiency: Secondary | ICD-10-CM | POA: Diagnosis not present

## 2017-09-02 LAB — CBC WITH DIFFERENTIAL/PLATELET
Basophils Absolute: 39 cells/uL (ref 0–200)
Basophils Relative: 0.4 %
EOS ABS: 97 {cells}/uL (ref 15–500)
Eosinophils Relative: 1 %
HCT: 35.9 % (ref 35.0–45.0)
Hemoglobin: 11.6 g/dL — ABNORMAL LOW (ref 11.7–15.5)
Lymphs Abs: 1940 cells/uL (ref 850–3900)
MCH: 25.8 pg — AB (ref 27.0–33.0)
MCHC: 32.3 g/dL (ref 32.0–36.0)
MCV: 79.8 fL — AB (ref 80.0–100.0)
MPV: 10.8 fL (ref 7.5–12.5)
Monocytes Relative: 7.3 %
NEUTROS PCT: 71.3 %
Neutro Abs: 6916 cells/uL (ref 1500–7800)
PLATELETS: 316 10*3/uL (ref 140–400)
RBC: 4.5 10*6/uL (ref 3.80–5.10)
RDW: 20.1 % — AB (ref 11.0–15.0)
TOTAL LYMPHOCYTE: 20 %
WBC: 9.7 10*3/uL (ref 3.8–10.8)
WBCMIX: 708 {cells}/uL (ref 200–950)

## 2017-09-02 LAB — IRON,TIBC AND FERRITIN PANEL
%SAT: 10 % (calc) — ABNORMAL LOW (ref 11–50)
Ferritin: 14 ng/mL (ref 10–232)
Iron: 37 ug/dL — ABNORMAL LOW (ref 40–190)
TIBC: 385 ug/dL (ref 250–450)

## 2017-09-03 ENCOUNTER — Ambulatory Visit
Admission: RE | Admit: 2017-09-03 | Discharge: 2017-09-03 | Disposition: A | Discharge status: Home / Self Care | Payer: 59 | Source: Ambulatory Visit | Attending: Internal Medicine | Admitting: Internal Medicine

## 2017-09-03 DIAGNOSIS — M940 Chondrocostal junction syndrome [Tietze]: Secondary | ICD-10-CM

## 2017-09-03 DIAGNOSIS — R0602 Shortness of breath: Secondary | ICD-10-CM | POA: Diagnosis not present

## 2017-09-08 ENCOUNTER — Encounter: Payer: Self-pay | Admitting: Gastroenterology

## 2017-09-08 NOTE — Progress Notes (Signed)
Sent in [place]. Dawn Hayes: as noted in your MyChart message, your Hgb is down a bit from several weeks ago but overall stable from past. It is 11.6, and in February you were in the 10 range. Your ferritin is only slightly improved. As noted in the message, we can refer to Hematology or I can order an iron infusion and recheck labs in 6 weeks. I feel this would help you get your iron stores where they need to be in a more timely fashion. Just let me know.

## 2017-09-10 ENCOUNTER — Other Ambulatory Visit: Payer: Self-pay

## 2017-09-10 ENCOUNTER — Ambulatory Visit: Payer: 59 | Admitting: Family Medicine

## 2017-09-10 ENCOUNTER — Encounter: Payer: Self-pay | Admitting: Family Medicine

## 2017-09-10 VITALS — BP 121/81 | HR 78 | Resp 16 | Ht 62.0 in | Wt 280.2 lb

## 2017-09-10 DIAGNOSIS — L4050 Arthropathic psoriasis, unspecified: Secondary | ICD-10-CM

## 2017-09-10 DIAGNOSIS — I10 Essential (primary) hypertension: Secondary | ICD-10-CM | POA: Diagnosis not present

## 2017-09-10 LAB — CBC WITH DIFFERENTIAL/PLATELET
BASOS PCT: 0.4 % (ref 0.0–3.0)
Basophils Absolute: 0.1 10*3/uL (ref 0.0–0.1)
EOS PCT: 0.1 % (ref 0.0–5.0)
Eosinophils Absolute: 0 10*3/uL (ref 0.0–0.7)
HCT: 38.2 % (ref 36.0–46.0)
Hemoglobin: 12.2 g/dL (ref 12.0–15.0)
LYMPHS ABS: 1.5 10*3/uL (ref 0.7–4.0)
Lymphocytes Relative: 10.8 % — ABNORMAL LOW (ref 12.0–46.0)
MCHC: 32.1 g/dL (ref 30.0–36.0)
MCV: 80.5 fl (ref 78.0–100.0)
MONO ABS: 0.7 10*3/uL (ref 0.1–1.0)
MONOS PCT: 5.2 % (ref 3.0–12.0)
NEUTROS PCT: 83.5 % — AB (ref 43.0–77.0)
Neutro Abs: 11.5 10*3/uL — ABNORMAL HIGH (ref 1.4–7.7)
Platelets: 355 10*3/uL (ref 150.0–400.0)
RBC: 4.74 Mil/uL (ref 3.87–5.11)
RDW: 23.1 % — AB (ref 11.5–15.5)
WBC: 13.8 10*3/uL — ABNORMAL HIGH (ref 4.0–10.5)

## 2017-09-10 LAB — LIPID PANEL
CHOL/HDL RATIO: 3
Cholesterol: 258 mg/dL — ABNORMAL HIGH (ref 0–200)
HDL: 77.4 mg/dL (ref >39.00)
LDL Cholesterol: 146 mg/dL — ABNORMAL HIGH (ref 0–99)
NONHDL: 180.33
TRIGLYCERIDES: 170 mg/dL — AB (ref 0.0–149.0)
VLDL: 34 mg/dL (ref 0.0–40.0)

## 2017-09-10 LAB — BASIC METABOLIC PANEL
BUN: 24 mg/dL — AB (ref 6–23)
CHLORIDE: 96 meq/L (ref 96–112)
CO2: 34 mEq/L — ABNORMAL HIGH (ref 19–32)
Calcium: 9.9 mg/dL (ref 8.4–10.5)
Creatinine, Ser: 0.78 mg/dL (ref 0.40–1.20)
GFR: 84.17 mL/min (ref >60.00)
Glucose, Bld: 113 mg/dL — ABNORMAL HIGH (ref 70–99)
POTASSIUM: 3.9 meq/L (ref 3.5–5.1)
SODIUM: 137 meq/L (ref 135–145)

## 2017-09-10 LAB — HEPATIC FUNCTION PANEL
ALK PHOS: 60 U/L (ref 39–117)
ALT: 31 U/L (ref 0–35)
AST: 15 U/L (ref 0–37)
Albumin: 3.9 g/dL (ref 3.5–5.2)
BILIRUBIN DIRECT: 0.1 mg/dL (ref 0.0–0.3)
BILIRUBIN TOTAL: 0.3 mg/dL (ref 0.2–1.2)
Total Protein: 6.8 g/dL (ref 6.0–8.3)

## 2017-09-10 LAB — TSH: TSH: 0.4 u[IU]/mL (ref 0.35–4.50)

## 2017-09-10 NOTE — Patient Instructions (Signed)
Schedule your complete physical in 6 months We'll notify you of your lab results and make any changes if needed Continue to work on healthy diet and regular exercise- you can do it! Periodically check your blood pressure and if it is climbing or consistently higher than 140/90, restart the Lisinopril. Call with any questions or concerns Happy Spring!!!

## 2017-09-10 NOTE — Progress Notes (Signed)
   Subjective:    Patient ID: Dawn Hayes DOBY, female    DOB: [birth year], 47 y.o.   MRN: [id number]  HPI HTN- chronic problem, on HCTZ 25mg  daily.  She is supposed to be taking Lisinopril 10mg  daily but she has not taken in 'a couple weeks'.  Adequate control.  No CP, SOB, HAs, visual changes, edema.  Obesity- chronic problem.  BMI is now 51.3.  Pt is not getting any formal or regular exercise.  Plans to swim once pool is open.  Pt is seeing Nutritionist- she had lost 10 lbs but gained it back w/ increased dose of Prednisone.  Is shooting for Bariatric surgery late August.  RA- following w/ [facility] (Dr Madaline Guthrie, [facility]).  Pt is switching from the [employer] to [employer].  Review of Systems For ROS see HPI     Objective:   Physical Exam  Constitutional: She is oriented to person, place, and time. She appears well-developed and well-nourished. No distress.  obese  HENT:  Head: Normocephalic and atraumatic.  Eyes: Pupils are equal, round, and reactive to light. Conjunctivae and EOM are normal.  Neck: Normal range of motion. Neck supple. No thyromegaly present.  Cardiovascular: Normal rate, regular rhythm, normal heart sounds and intact distal pulses.  No murmur heard. Pulmonary/Chest: Effort normal and breath sounds normal. No respiratory distress.  Abdominal: Soft. She exhibits no distension. There is no tenderness.  Musculoskeletal: She exhibits no edema.  Lymphadenopathy:    She has no cervical adenopathy.  Neurological: She is alert and oriented to person, place, and time.  Skin: Skin is warm and dry.  Psychiatric: She has a normal mood and affect. Her behavior is normal.  Vitals reviewed.         Assessment & Plan:

## 2017-09-10 NOTE — Assessment & Plan Note (Signed)
Ongoing issue for pt.  She is hoping to have bariatric surgery later this summer.  Weight loss will improve pt's BP- will also help her pain levels

## 2017-09-10 NOTE — Assessment & Plan Note (Signed)
Chronic problem, following w/ Dr Amil Amen.  Will follow along and assist as able.  Her continuous use of Prednisone is not helping her obesity.  Hopefully her Dawn Hayes will better control her sxs and she can wean off Prednisone.

## 2017-09-10 NOTE — Assessment & Plan Note (Signed)
Chronic problem.  Well controlled today w/o Lisinopril.  Will continue her HCTZ daily.  She reports she will have additional swelling in the summer months and sometimes has to take her HCTZ BID to improve swelling.  Told her if this was the case, just to let us know and we can send 90 day supply.  Will follow.

## 2017-09-11 ENCOUNTER — Telehealth: Payer: Self-pay | Admitting: Gastroenterology

## 2017-09-11 ENCOUNTER — Other Ambulatory Visit (INDEPENDENT_AMBULATORY_CARE_PROVIDER_SITE_OTHER): Payer: 59

## 2017-09-11 DIAGNOSIS — R7309 Other abnormal glucose: Secondary | ICD-10-CM

## 2017-09-11 LAB — HEMOGLOBIN A1C: Hgb A1c MFr Bld: 6.2 % (ref 4.6–6.5)

## 2017-09-11 NOTE — Telephone Encounter (Signed)
Can we schedule iron infusion on 5/6 or 5/7? If any openings?

## 2017-09-11 NOTE — Telephone Encounter (Signed)
Dawn Hayes, were you planning on ordering iron infusions or planning on hematology referral. See patient message.

## 2017-09-14 ENCOUNTER — Other Ambulatory Visit: Payer: Self-pay

## 2017-09-14 DIAGNOSIS — D509 Iron deficiency anemia, unspecified: Secondary | ICD-10-CM

## 2017-09-14 NOTE — Telephone Encounter (Signed)
AB will pt need 1 or 2 units?

## 2017-09-14 NOTE — Telephone Encounter (Signed)
IV iron (Feraheme or Injectafer is what we usually use).  Would recommend Feraheme IV X 1. There shouldn't need to be any additional appts other than showing up for iron infusion (usually done on 4th floor unless it has changed?)  Recheck CBC, iron, ferritin, TIBC in 6- 8 weeks post IV infusion.

## 2017-09-14 NOTE — Telephone Encounter (Signed)
Please advise Alicia/Doris. Thanks

## 2017-09-14 NOTE — Telephone Encounter (Signed)
Noted. Paper work has been faxed, pt is aware that Dawn Hayes will contact her with a time to come in for ion iron infusion. Lab orders have been placed s/p iron infusion x 6-8 weeks.

## 2017-09-15 ENCOUNTER — Encounter (HOSPITAL_COMMUNITY)
Admission: RE | Admit: 2017-09-15 | Discharge: 2017-09-15 | Disposition: A | Discharge status: Home / Self Care | Payer: 59 | Source: Ambulatory Visit | Attending: Gastroenterology | Admitting: Gastroenterology

## 2017-09-15 DIAGNOSIS — D509 Iron deficiency anemia, unspecified: Secondary | ICD-10-CM | POA: Diagnosis present

## 2017-09-15 MED ORDER — FERUMOXYTOL INJECTION 510 MG/17 ML
510.0000 mg | Freq: Once | INTRAVENOUS | Status: AC
  Administered 2017-09-15: 510 mg via INTRAVENOUS
  Filled 2017-09-15: qty 17

## 2017-09-15 MED ORDER — SODIUM CHLORIDE 0.9 % IV SOLN
INTRAVENOUS | Status: DC
  Administered 2017-09-15: 250 mL via INTRAVENOUS

## 2017-09-17 ENCOUNTER — Encounter: Payer: Self-pay | Admitting: Family Medicine

## 2017-09-17 DIAGNOSIS — M940 Chondrocostal junction syndrome [Tietze]: Secondary | ICD-10-CM | POA: Diagnosis not present

## 2017-09-17 DIAGNOSIS — L4050 Arthropathic psoriasis, unspecified: Secondary | ICD-10-CM | POA: Diagnosis not present

## 2017-09-17 DIAGNOSIS — L409 Psoriasis, unspecified: Secondary | ICD-10-CM | POA: Diagnosis not present

## 2017-09-21 ENCOUNTER — Other Ambulatory Visit: Payer: Self-pay | Admitting: Family Medicine

## 2017-09-23 ENCOUNTER — Encounter: Payer: Self-pay | Admitting: Family Medicine

## 2017-09-24 ENCOUNTER — Other Ambulatory Visit: Payer: Self-pay

## 2017-09-24 ENCOUNTER — Other Ambulatory Visit: Payer: Self-pay | Admitting: Gastroenterology

## 2017-09-24 DIAGNOSIS — D509 Iron deficiency anemia, unspecified: Secondary | ICD-10-CM

## 2017-09-28 ENCOUNTER — Other Ambulatory Visit: Payer: Self-pay | Admitting: Family Medicine

## 2017-09-28 NOTE — Telephone Encounter (Signed)
Please advise  Last OV 09/10/17 chantix last filled 06/19/17 #60 with 1 (continuing month pak)

## 2017-09-30 ENCOUNTER — Encounter: Payer: 59 | Attending: General Surgery | Admitting: Registered"

## 2017-09-30 ENCOUNTER — Encounter: Payer: Self-pay | Admitting: Registered"

## 2017-09-30 DIAGNOSIS — Z713 Dietary counseling and surveillance: Secondary | ICD-10-CM | POA: Diagnosis not present

## 2017-09-30 DIAGNOSIS — Z886 Allergy status to analgesic agent status: Secondary | ICD-10-CM | POA: Diagnosis not present

## 2017-09-30 DIAGNOSIS — Z79899 Other long term (current) drug therapy: Secondary | ICD-10-CM | POA: Insufficient documentation

## 2017-09-30 DIAGNOSIS — Z6841 Body Mass Index (BMI) 40.0 and over, adult: Secondary | ICD-10-CM | POA: Diagnosis not present

## 2017-09-30 DIAGNOSIS — E669 Obesity, unspecified: Secondary | ICD-10-CM

## 2017-09-30 NOTE — Patient Instructions (Addendum)
-   Make a list of non food related activities.  - Reduce alcohol intake to once during the week and once during the weekend.   - Keep up the great work!

## 2017-09-30 NOTE — Progress Notes (Signed)
RYGB/Sleeve Gastrectomy Appt start time: 11:08 end time: 11:21  Assessment: 3rd SWL Appointment.   Start Wt at NDES: 273.5 Wt: 283.2 BMI: 50.99   Pt arrives having gained about 5 lbs from previous visit.   Pt states she was in the emergency room for a cyst that was on her ovary. Pt states she has been doing well  not drinking 15 minutes before eating, not while eating, and waiting 30 minutes after eating to drink. Pt states sometimes she forgets but very conscious while at work. Pt states she wants to work on not drinking alcohol. Pt states he likes red wine; drinks about 1-2 times during week and 1-2 times on weekends.   Pt states she is leaning more towards the RYGB due to having horrible reflux.   Pt states she likes the [employer] protein shakes (30g protein-chocolate and vanilla) and can tolerate Premier Protein as well as Atkins shakes. Pt states she has been able to have more vegetables with lunch and dinner such as peas, green beans, and salad. Pt states cauliflower and broccoli are not her favorite but snacks on them at times. Pt states she is consistently exceeding 64 ounces of fluid a day. Pt states she also keeps water bottle on nightstand to sip when waking up at night.   Pt states dosage of prednisone has reduced lately; taking 5 mg. Pt reports in previous visits prednisone has increased appetite. Pt states she is leaning towards wanting to have RYGB but wants to discuss with surgeon again. Pt tracks food and drink intake in personal notebook.   Per insurance, pt needs 6 SWL visits prior to surgery.  MEDICATIONS: See list   DIETARY INTAKE:  24-hr recall:  B ( AM): string cheese or apple Snk ( AM): greek yogurt or fritos L ( PM): mini ravioli or meatloaf, salad Snk ( PM): pickles or dark chocolate or tomatoes, cucumbers D ( PM): cheese pizza, vegetable medley Snk ( PM): sometimes fruit Beverages: water, unsweetened tea (sometimes), red wine (occasionally)  Usual physical  activity: DVDs 3x/week, walking  Diet to Follow: 1800 calories 200 g carbohydrates 135 g protein 50 g fat  Preferred Learning Style:   No preference indicated   Learning Readiness:   Ready  Change in progress     Nutritional Diagnosis:  [place]-3.3 Overweight/obesity related to past poor dietary habits and physical inactivity as evidenced by patient w/ planned RYGB/sleeve gastrectomy surgery following dietary guidelines for continued weight loss.    Intervention:  Nutrition counseling for upcoming Bariatric Surgery. Pt was counseled on ways to reduce alcohol intake to prepare for life after surgery. Pt was also encouraged to maintain great habits already established with eating 3 meals a day, not drinking around meals, recommended possibly setting alarms around eating to stop/start drinking, chewing well, and consistently exceeding 64 ounces of fluid a day.  Goals:  - Aim for 150 minutes of physical activity including cardio and weight bearing every week - Make a list of non food related activities. - Reduce alcohol intake to once during the week and once during the weekend.  - Keep up the great work!   Teaching Method Utilized:  Visual Auditory Hands on  Handouts given during visit include:  none  Barriers to learning/adherence to lifestyle change: none identified  Demonstrated degree of understanding via:  Teach Back   Monitoring/Evaluation:  Dietary intake, exercise, and body weight in 1 month(s).

## 2017-10-01 ENCOUNTER — Ambulatory Visit: Payer: Commercial Managed Care - HMO | Admitting: Obstetrics and Gynecology

## 2017-10-06 ENCOUNTER — Ambulatory Visit: Payer: Self-pay | Admitting: Gastroenterology

## 2017-10-07 ENCOUNTER — Ambulatory Visit: Payer: Self-pay | Admitting: Gastroenterology

## 2017-10-07 ENCOUNTER — Encounter: Payer: Self-pay | Admitting: Family Medicine

## 2017-10-07 MED ORDER — HYDROCHLOROTHIAZIDE 25 MG PO TABS: 25.0000 mg | ORAL_TABLET | Freq: Every day | ORAL | 2 refills | Status: DC

## 2017-10-07 MED ORDER — FUROSEMIDE 20 MG PO TABS
20.0000 mg | ORAL_TABLET | Freq: Every day | ORAL | 3 refills | Status: DC
Start: 2017-10-07 — End: 2018-02-02

## 2017-10-08 ENCOUNTER — Ambulatory Visit: Payer: Self-pay | Admitting: Gastroenterology

## 2017-10-14 ENCOUNTER — Ambulatory Visit: Payer: Self-pay | Admitting: Gastroenterology

## 2017-10-14 ENCOUNTER — Encounter: Payer: Self-pay | Admitting: Family Medicine

## 2017-10-21 DIAGNOSIS — L4050 Arthropathic psoriasis, unspecified: Secondary | ICD-10-CM | POA: Diagnosis not present

## 2017-10-28 ENCOUNTER — Other Ambulatory Visit: Payer: Self-pay

## 2017-10-28 ENCOUNTER — Encounter: Payer: Self-pay | Admitting: Gastroenterology

## 2017-10-28 ENCOUNTER — Ambulatory Visit: Payer: 59 | Admitting: Gastroenterology

## 2017-10-28 ENCOUNTER — Ambulatory Visit: Payer: 59 | Admitting: Physician Assistant

## 2017-10-28 ENCOUNTER — Encounter: Payer: Self-pay | Admitting: Physician Assistant

## 2017-10-28 VITALS — BP 138/93 | HR 104 | Temp 96.8°F | Ht 61.5 in | Wt 273.6 lb

## 2017-10-28 VITALS — BP 110/74 | HR 100 | Temp 97.9°F | Resp 15 | Ht 62.0 in | Wt 273.8 lb

## 2017-10-28 DIAGNOSIS — D509 Iron deficiency anemia, unspecified: Secondary | ICD-10-CM

## 2017-10-28 DIAGNOSIS — R52 Pain, unspecified: Secondary | ICD-10-CM | POA: Diagnosis not present

## 2017-10-28 LAB — SEDIMENTATION RATE: SED RATE: 51 mm/h — AB (ref 0–20)

## 2017-10-28 LAB — CK: Total CK: 73 U/L (ref 7–177)

## 2017-10-28 MED ORDER — ACETAMINOPHEN-CODEINE 300-60 MG PO TABS: 1.0000 | ORAL_TABLET | ORAL | 0 refills | Status: DC | PRN

## 2017-10-28 NOTE — Patient Instructions (Signed)
1. Please take orders to [facility].  There are 2 locations in [place]. [place]. [address], [phone] 2. We will contact you within 1 week with results, if you have not heard from me please call the office or send me a message through my chart to make sure we have received the results. 3. Good luck on your upcoming gastric bypass surgery.

## 2017-10-28 NOTE — Assessment & Plan Note (Signed)
Chronic iron deficiency anemia, initially improved after her hysterectomy in 2017.  Received iron infusion last month.  No overt GI bleeding.  Colonoscopy up-to-date as outlined above.  EGD in 2017 and recent capsule study.  We will recheck her labs at this time.  She is going to have a gastric bypass later this summer.  She will be prone for iron deficiency in that setting and she should have this closely monitored and treated appropriately.

## 2017-10-28 NOTE — Progress Notes (Signed)
Patient presents to clinic today c/o generalized aches starting the week of 10/14/17. Denies fever, chills, rash. Has history of arthritis and psoriatic arthritis. Is currently on a regimen of Stelara and Diclofenac. Was on prednisone for several years that she was recently tapered off of. Notes symptoms since discontinuation. Denies any travel or sick contact. Aches are in the joints per patient. No muscular irritation.   Past Medical History:  Diagnosis Date  . Anemia   . Arthritis   . Costochondritis   . Diverticulitis   . Dysmenorrhea   . GERD (gastroesophageal reflux disease)   . Headache    Migraines  . History of hiatal hernia   . Hypertension   . Menorrhagia   . Psoriatic arthritis ([place])   . Smoker     Current Outpatient Medications on File Prior to Visit  Medication Sig Dispense Refill  . celecoxib (CELEBREX) 200 MG capsule Take 200 mg by mouth daily.  2  . CHANTIX 1 MG tablet TAKE 1 TABLET(1 MG) BY MOUTH TWICE DAILY (Patient taking differently: TAKE 1 TABLET(1 MG) BY MOUTH TWICE DAILY. Takes once daily) 60 tablet 0  . diclofenac sodium (VOLTAREN) 1 % GEL APP 4 GRAMS EXT AA QID. Takes as needed  0  . esomeprazole (NEXIUM) 40 MG capsule TAKE 1 CAPSULE(40 MG) BY MOUTH DAILY BEFORE BREAKFAST 30 capsule 11  . furosemide (LASIX) 20 MG tablet Take 1 tablet (20 mg total) by mouth daily. As needed for swelling 30 tablet 3  . hydrochlorothiazide (HYDRODIURIL) 25 MG tablet Take 1 tablet (25 mg total) by mouth daily. 30 tablet 2  . lactobacillus acidophilus (BACID) TABS tablet Take 1 tablet by mouth daily.    . Multiple Vitamin (MULTIVITAMIN WITH MINERALS) TABS tablet Take 1 tablet by mouth daily.    Marland Kitchen nystatin (MYCOSTATIN) 100000 UNIT/ML suspension SWISH AND SPIT 5 ML BY MOUTH FOUR TIMES DAILY AS NEEDED FOR THRUSH 473 mL 0  . triamcinolone cream (KENALOG) 0.1 % Apply 1 application topically daily as needed (for irritation).     Marland Kitchen ustekinumab (STELARA) 130 MG/26ML SOLN injection Inject  into the vein.    Marland Kitchen XIIDRA 5 % SOLN Instill 1 drop into both eyes two times a day. Takes as needed  12   No current facility-administered medications on file prior to visit.     Allergies  Allergen Reactions  . Aspirin Other (See Comments)    Convulsions   . Toradol [Ketorolac Tromethamine] Nausea And Vomiting  . Adhesive [Tape] Itching and Rash    Family History  Problem Relation Age of Onset  . Brain cancer Father   . Prostate cancer Father   . Hyperlipidemia Mother   . Colon cancer Maternal Grandmother 12  . Stroke Paternal Grandfather        COD  . Aneurysm Maternal Grandfather   . Cancer Other   . Hypertension Other   . Other Paternal Uncle        gastric polyps    Social History   Socioeconomic History  . Marital status: Married    Spouse name: Not on file  . Number of children: 0  . Years of education: Not on file  . Highest education level: Not on file  Occupational History  . 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 Financial resource strain: Not on file  . Food insecurity:    Worry: Never true    Inability: Never true  . Transportation needs:    Medical: Not  on file    Non-medical: Not on file  Tobacco Use  . Smoking status: Former Smoker    Packs/day: 0.10    Years: 20.00    Pack years: 2.00    Types: Cigarettes  . Smokeless tobacco: Never Used  . Tobacco comment: smoking 5 cigarettes a day, hasn't quit completely   Substance and Sexual Activity  . Alcohol use: Yes    Alcohol/week: 9.6 oz    Types: 6 Glasses of wine, 10 Standard drinks or equivalent per week    Comment: socially, weekends 4-5  . Drug use: No  . Sexual activity: Yes    Partners: Male    Birth control/protection: None  Lifestyle  . Physical activity:    Days per week: Not on file    Minutes per session: Not on file  . Stress: Not on file  Relationships  . Social connections:    Talks on phone: Not on file    Gets together: Not on file    Attends  religious service: Not on file    Active member of club or organization: Not on file    Attends meetings of clubs or organizations: Not on file    Relationship status: Not on file  Other Topics Concern  . Not on file  Social History Narrative  . Not on file    Review of Systems - See HPI.  All other ROS are negative.  BP 110/74   Pulse 100   Temp 97.9 F (36.6 C) (Oral)   Resp 15   Ht _0  (1.575 m)   Wt 273 lb 12.8 oz (124.2 kg)   LMP 02/28/2016   SpO2 95%   BMI 50.08 kg/m   Physical Exam  Constitutional: She is oriented to person, place, and time. She appears well-developed and well-nourished.  HENT:  Head: Normocephalic and atraumatic.  Neck: Neck supple.  Cardiovascular: Normal rate, regular rhythm and normal heart sounds.  Pulmonary/Chest: Effort normal.  Musculoskeletal: Normal range of motion. She exhibits no edema or tenderness.  Lymphadenopathy:    She has no cervical adenopathy.  Neurological: She is alert and oriented to person, place, and time.  Psychiatric: She has a normal mood and affect.  Vitals reviewed.   Recent Results (from the past 2160 hour(s))  Urinalysis, Routine w reflex microscopic     Status: Abnormal   Collection Time: 08/25/17  7:06 PM  Result Value Ref Range   Color, Urine YELLOW YELLOW   APPearance HAZY (A) CLEAR   Specific Gravity, Urine 1.027 1.005 - 1.030   pH 6.0 5.0 - 8.0   Glucose, UA NEGATIVE NEGATIVE mg/dL   Hgb urine dipstick NEGATIVE NEGATIVE   Bilirubin Urine NEGATIVE NEGATIVE   Ketones, ur NEGATIVE NEGATIVE mg/dL   Protein, ur NEGATIVE NEGATIVE mg/dL   Nitrite NEGATIVE NEGATIVE   Leukocytes, UA NEGATIVE NEGATIVE    Comment: Performed at [facility], [address] [place], [place] [address]  Lipase, blood     Status: None   Collection Time: 08/25/17  7:27 PM  Result Value Ref Range   Lipase 31 11 - 51 U/L    Comment: Performed at [facility], [address], [place], [place] [address]  Comprehensive metabolic  panel     Status: None   Collection Time: 08/25/17  7:27 PM  Result Value Ref Range   Sodium 137 135 - 145 mmol/L   Potassium 3.7 3.5 - 5.1 mmol/L   Chloride 102 101 - 111 mmol/L  CO2 24 22 - 32 mmol/L   Glucose, Bld 82 65 - 99 mg/dL   BUN 20 6 - 20 mg/dL   Creatinine, Ser 0.69 0.44 - 1.00 mg/dL   Calcium 9.3 8.9 - 10.3 mg/dL   Total Protein 7.2 6.5 - 8.1 g/dL   Albumin 3.6 3.5 - 5.0 g/dL   AST 19 15 - 41 U/L   ALT 24 14 - 54 U/L   Alkaline Phosphatase 59 38 - 126 U/L   Total Bilirubin 0.3 0.3 - 1.2 mg/dL   GFR calc non Af Amer >60 >60 mL/min   GFR calc Af Amer >60 >60 mL/min    Comment: (NOTE) The eGFR has been calculated using the CKD EPI equation. This calculation has not been validated in all clinical situations. eGFR's persistently <60 mL/min signify possible Chronic Kidney Disease.    Anion gap 11 5 - 15    Comment: Performed at [facility], [address], [place], [place] [address]  CBC     Status: None   Collection Time: 08/25/17  7:27 PM  Result Value Ref Range   WBC 9.6 4.0 - 10.5 K/uL   RBC 4.63 3.87 - 5.11 MIL/uL   Hemoglobin 13.6 12.0 - 15.0 g/dL   HCT 41.7 36.0 - 46.0 %   MCV 90.1 78.0 - 100.0 fL   MCH 29.4 26.0 - 34.0 pg   MCHC 32.6 30.0 - 36.0 g/dL   RDW 12.5 11.5 - 15.5 %   Platelets 349 150 - 400 K/uL    Comment: Performed at [facility], [address] [place], [place] [address]  Fe+TIBC+Fer     Status: Abnormal   Collection Time: 09/02/17 10:13 AM  Result Value Ref Range   Iron 37 (L) 40 - 190 mcg/dL   TIBC 385 250 - 450 mcg/dL (calc)   %SAT 10 (L) 11 - 50 % (calc)   Ferritin 14 10 - 232 ng/mL  CBC with Differential/Platelet     Status: Abnormal   Collection Time: 09/02/17 10:13 AM  Result Value Ref Range   WBC 9.7 3.8 - 10.8 Thousand/uL   RBC 4.50 3.80 - 5.10 Million/uL   Hemoglobin 11.6 (L) 11.7 - 15.5 g/dL   HCT 35.9 35.0 - 45.0 %   MCV 79.8 (L) 80.0 - 100.0 fL   MCH 25.8 (L) 27.0 - 33.0 pg   MCHC 32.3 32.0 - 36.0 g/dL   RDW 20.1 (H)  11.0 - 15.0 %   Platelets 316 140 - 400 Thousand/uL   MPV 10.8 7.5 - 12.5 fL   Neutro Abs 6,916 1,500 - 7,800 cells/uL   Lymphs Abs 1,940 850 - 3,900 cells/uL   WBC mixed population 708 200 - 950 cells/uL   Eosinophils Absolute 97 15 - 500 cells/uL   Basophils Absolute 39 0 - 200 cells/uL   Neutrophils Relative % 71.3 %   Total Lymphocyte 20.0 %   Monocytes Relative 7.3 %   Eosinophils Relative 1.0 %   Basophils Relative 0.4 %  Lipid panel     Status: Abnormal   Collection Time: 09/10/17  1:29 PM  Result Value Ref Range   Cholesterol 258 (H) 0 - 200 mg/dL    Comment: ATP III Classification       Desirable:  < 200 mg/dL               Borderline High:  200 - 239 mg/dL          High:  > = 240  mg/dL   Triglycerides 170.0 (H) 0.0 - 149.0 mg/dL    Comment: Normal:  <150 mg/dLBorderline High:  150 - 199 mg/dL   HDL 77.40 >39.00 mg/dL   VLDL 34.0 0.0 - 40.0 mg/dL   LDL Cholesterol 146 (H) 0 - 99 mg/dL   Total CHOL/HDL Ratio 3     Comment:                Men          Women1/2 Average Risk     3.4          3.3Average Risk          5.0          4.42X Average Risk          9.6          7.13X Average Risk          15.0          11.0                       NonHDL 180.33     Comment: NOTE:  Non-HDL goal should be 30 mg/dL higher than patient's LDL goal (i.e. LDL goal of < 70 mg/dL, would have non-HDL goal of < 100 mg/dL)  Basic metabolic panel     Status: Abnormal   Collection Time: 09/10/17  1:29 PM  Result Value Ref Range   Sodium 137 135 - 145 mEq/L   Potassium 3.9 3.5 - 5.1 mEq/L   Chloride 96 96 - 112 mEq/L   CO2 34 (H) 19 - 32 mEq/L   Glucose, Bld 113 (H) 70 - 99 mg/dL   BUN 24 (H) 6 - 23 mg/dL   Creatinine, Ser 0.78 0.40 - 1.20 mg/dL   Calcium 9.9 8.4 - 10.5 mg/dL   GFR 84.17 >60.00 mL/min  Hepatic function panel     Status: None   Collection Time: 09/10/17  1:29 PM  Result Value Ref Range   Total Bilirubin 0.3 0.2 - 1.2 mg/dL   Bilirubin, Direct 0.1 0.0 - 0.3 mg/dL   Alkaline  Phosphatase 60 39 - 117 U/L   AST 15 0 - 37 U/L   ALT 31 0 - 35 U/L   Total Protein 6.8 6.0 - 8.3 g/dL   Albumin 3.9 3.5 - 5.2 g/dL  TSH     Status: None   Collection Time: 09/10/17  1:29 PM  Result Value Ref Range   TSH 0.40 0.35 - 4.50 uIU/mL  CBC with Differential/Platelet     Status: Abnormal   Collection Time: 09/10/17  1:29 PM  Result Value Ref Range   WBC 13.8 (H) 4.0 - 10.5 K/uL   RBC 4.74 3.87 - 5.11 Mil/uL   Hemoglobin 12.2 12.0 - 15.0 g/dL   HCT 38.2 36.0 - 46.0 %   MCV 80.5 78.0 - 100.0 fl   MCHC 32.1 30.0 - 36.0 g/dL   RDW 23.1 (H) 11.5 - 15.5 %   Platelets 355.0 150.0 - 400.0 K/uL   Neutrophils Relative % 83.5 (H) 43.0 - 77.0 %   Lymphocytes Relative 10.8 (L) 12.0 - 46.0 %   Monocytes Relative 5.2 3.0 - 12.0 %   Eosinophils Relative 0.1 0.0 - 5.0 %   Basophils Relative 0.4 0.0 - 3.0 %   Neutro Abs 11.5 (H) 1.4 - 7.7 K/uL   Lymphs Abs 1.5 0.7 - 4.0 K/uL   Monocytes Absolute 0.7 0.1 - 1.0 K/uL  Eosinophils Absolute 0.0 0.0 - 0.7 K/uL   Basophils Absolute 0.1 0.0 - 0.1 K/uL  Hemoglobin A1c     Status: None   Collection Time: 09/11/17 11:39 AM  Result Value Ref Range   Hgb A1c MFr Bld 6.2 4.6 - 6.5 %    Comment: Glycemic Control Guidelines for People with Diabetes:Non Diabetic:  <6%Goal of Therapy: <7%Additional Action Suggested:  >8%   Sedimentation rate     Status: Abnormal   Collection Time: 10/28/17  1:29 PM  Result Value Ref Range   Sed Rate 51 (H) 0 - 20 mm/hr  Antinuclear Antib (ANA)     Status: None   Collection Time: 10/28/17  1:29 PM  Result Value Ref Range   Anti Nuclear Antibody(ANA) NEGATIVE NEGATIVE    Comment: ANA IFA is a first line screen for detecting the presence of up to approximately 150 autoantibodies in various autoimmune diseases. A negative ANA IFA result suggests ANA-associated autoimmune diseases are not present at this time. . Visit Physician FAQs for interpretation of all antibodies in the [place], prevalence, and  association with diseases at http://education.[facility].com/ [email or web address] .   CK (Creatine Kinase)     Status: None   Collection Time: 10/28/17  1:29 PM  Result Value Ref Range   Total CK 73 7 - 177 U/L  B. burgdorfi antibodies     Status: None   Collection Time: 10/28/17  1:29 PM  Result Value Ref Range   B burgdorferi Ab IgG+IgM <0.90 index    Comment:                    Index                Interpretation                    -----                --------------                    < 0.90               Negative                    0.90-1.09            Equivocal                    > 1.09               Positive . As recommended by the Food and Drug Administration  (FDA), all samples with positive or equivocal  results in a Borrelia burgdorferi antibody screen will be tested using a blot method. Positive or  equivocal screening test results should not be  interpreted as truly positive until verified as such  using a supplemental assay (e.g., B. burgdorferi blot). . The screening test and/or blot for B. burgdorferi  antibodies may be falsely negative in early stages of Lyme disease, including the period when erythema  migrans is apparent. .     Assessment/Plan: 1. Body aches Unclear etiology. Question if her chronic prednisone was masking another AI issue or if there is worsening of her psoriatic arthritis. Will obtain lab assessment today to further assess. Recommend follow-up with her specialist. Diclofenac and other pain medications as directed. - Sedimentation rate - Antinuclear Antib (ANA) - CK (Creatine Kinase) - B. burgdorfi antibodies   Leeanne Rio, PA-C

## 2017-10-28 NOTE — Patient Instructions (Signed)
Please go to the lab today for blood work.  I will call you with your results. We will alter treatment regimen(s) if indicated by your results.   Please use the Tylenol w codeine as directed instead of the Tramadol. Continue topical medications Again we will switch things up once we get some lab results.

## 2017-10-28 NOTE — Progress Notes (Signed)
Primary Care Physician: Midge Minium, MD  Primary Gastroenterologist:  Garfield Cornea, MD   Chief Complaint  Patient presents with  . Anemia    HPI: Dawn Hayes is a 47 y.o. female here for follow-up of iron deficiency anemia.  Last seen in the office in February.  Had underwent hemorrhoid banding of the left lateral and right anterior internal hemorrhoids in November 2018.  No bleeding since that time.  She had a colonoscopy prior to that in October 2018 showing diverticulosis and grade 2 internal hemorrhoids, normal terminal ileum.  Last EGD January 2017 with hiatal hernia.  Mobile capsule study done in April 2019 with possible few proximal small bowel superficial erosions but no ulcerations, hyperplastic benign-appearing polyps in the stomach.  She has been on anti-inflammatories in the past, prior to the capsule study she had been on ibuprofen but recently started on Celebrex.  She tapered off prednisone which she was on for psoriatic arthritis and costochondritis.  After tapering off prednisone she started noticing diffuse body aches.  Saw her PCP today and blood work being done for possible tickborne illness as well as ANA and sed rate.  Typically having about 1 bowel movement per day.  She takes oral iron pill every other day.  No melena or rectal bleeding.  No abdominal pain.  No heartburn.  She is going to have gastric bypass around August of this year.  Patient has history of iron deficiency anemia dating back several years.  She underwent hysterectomy for heavy menstrual cycles back in 2017, finally nearly normalized her iron and ferritin in February 2018 but has had subsequent decline since then.  She received 2 iron infusions prior to her hysterectomy, she received iron infusion last month as well.  Current Outpatient Medications  Medication Sig Dispense Refill  . acetaminophen-codeine (TYLENOL/CODEINE #4) 300-60 MG tablet Take 1 tablet by mouth every 4 (four)  hours as needed for pain. 20 tablet 0  . celecoxib (CELEBREX) 200 MG capsule Take 200 mg by mouth daily.  2  . CHANTIX 1 MG tablet TAKE 1 TABLET(1 MG) BY MOUTH TWICE DAILY (Patient taking differently: TAKE 1 TABLET(1 MG) BY MOUTH TWICE DAILY. Takes once daily) 60 tablet 0  . diclofenac sodium (VOLTAREN) 1 % GEL APP 4 GRAMS EXT AA QID. Takes as needed  0  . esomeprazole (NEXIUM) 40 MG capsule TAKE 1 CAPSULE(40 MG) BY MOUTH DAILY BEFORE BREAKFAST 30 capsule 11  . furosemide (LASIX) 20 MG tablet Take 1 tablet (20 mg total) by mouth daily. As needed for swelling 30 tablet 3  . hydrochlorothiazide (HYDRODIURIL) 25 MG tablet Take 1 tablet (25 mg total) by mouth daily. 30 tablet 2  . lactobacillus acidophilus (BACID) TABS tablet Take 1 tablet by mouth daily.    . Multiple Vitamin (MULTIVITAMIN WITH MINERALS) TABS tablet Take 1 tablet by mouth daily.    Marland Kitchen nystatin (MYCOSTATIN) 100000 UNIT/ML suspension SWISH AND SPIT 5 ML BY MOUTH FOUR TIMES DAILY AS NEEDED FOR THRUSH 473 mL 0  . triamcinolone cream (KENALOG) 0.1 % Apply 1 application topically daily as needed (for irritation).     Marland Kitchen ustekinumab (STELARA) 130 MG/26ML SOLN injection Inject into the vein.    Marland Kitchen XIIDRA 5 % SOLN Instill 1 drop into both eyes two times a day. Takes as needed  12   No current facility-administered medications for this visit.     Allergies as of 10/28/2017 - Review Complete 10/28/2017  Allergen Reaction Noted  .  Aspirin Other (See Comments) 12/06/2009  . Toradol [ketorolac tromethamine] Nausea And Vomiting 04/07/2016  . Adhesive [tape] Itching and Rash 09/26/2013    ROS:  General: Negative for anorexia, weight loss, fever, chills, fatigue, weakness. ENT: Negative for hoarseness, difficulty swallowing , nasal congestion. CV: Negative for chest pain, angina, palpitations, dyspnea on exertion, peripheral edema.  Respiratory: Negative for dyspnea at rest, dyspnea on exertion, cough, sputum, wheezing.  GI: See history of  present illness. GU:  Negative for dysuria, hematuria, urinary incontinence, urinary frequency, nocturnal urination.  Endo: Negative for unusual weight change.    Physical Examination:   BP (!) 138/93   Pulse (!) 104   Temp (!) 96.8 F (36 C) (Oral)   Ht 5' 1.5" (1.562 m)   Wt 273 lb 9.6 oz (124.1 kg)   LMP 02/28/2016   BMI 50.86 kg/m   General: Well-nourished, well-developed in no acute distress.  Eyes: No icterus. Mouth: Oropharyngeal mucosa moist and pink , no lesions erythema or exudate. Lungs: Clear to auscultation bilaterally.  Heart: Regular rate and rhythm, no murmurs rubs or gallops.  Abdomen: Bowel sounds are normal, nontender, nondistended, no hepatosplenomegaly or masses, no abdominal bruits or hernia , no rebound or guarding.   Extremities: No lower extremity edema. No clubbing or deformities. Neuro: Alert and oriented x 4   Skin: Warm and dry, no jaundice.   Psych: Alert and cooperative, normal mood and affect.  Labs:  Lab Results  Component Value Date   CREATININE 0.78 09/10/2017   BUN 24 (H) 09/10/2017   NA 137 09/10/2017   K 3.9 09/10/2017   CL 96 09/10/2017   CO2 34 (H) 09/10/2017   Lab Results  Component Value Date   IRON 37 (L) 09/02/2017   TIBC 385 09/02/2017   FERRITIN 14 09/02/2017   Lab Results  Component Value Date   WBC 13.8 (H) 09/10/2017   HGB 12.2 09/10/2017   HCT 38.2 09/10/2017   MCV 80.5 09/10/2017   PLT 355.0 09/10/2017    Imaging Studies: No results found.

## 2017-10-29 ENCOUNTER — Encounter: Payer: Self-pay | Admitting: Registered"

## 2017-10-29 ENCOUNTER — Encounter: Payer: 59 | Attending: General Surgery | Admitting: Registered"

## 2017-10-29 DIAGNOSIS — Z79899 Other long term (current) drug therapy: Secondary | ICD-10-CM | POA: Diagnosis not present

## 2017-10-29 DIAGNOSIS — Z6841 Body Mass Index (BMI) 40.0 and over, adult: Secondary | ICD-10-CM | POA: Diagnosis not present

## 2017-10-29 DIAGNOSIS — Z713 Dietary counseling and surveillance: Secondary | ICD-10-CM | POA: Insufficient documentation

## 2017-10-29 DIAGNOSIS — E669 Obesity, unspecified: Secondary | ICD-10-CM

## 2017-10-29 DIAGNOSIS — Z886 Allergy status to analgesic agent status: Secondary | ICD-10-CM | POA: Diagnosis not present

## 2017-10-29 NOTE — Progress Notes (Signed)
cc'd to pcp 

## 2017-10-29 NOTE — Progress Notes (Signed)
RYGB/Sleeve Gastrectomy Appt start time: 12:00 end time: 12:22  Assessment: 5th SWL Appointment.   Start Wt at [facility]: 273.5 Wt: 274.6 BMI: 50.22   Pt arrives having lost about 8.6 lbs from previous visit.   Pt states she is no longer taking prednisone after taking it for 2 years. Pt states she is having more aches and pains, still able to do pool workouts. Pt states she has been feeling more nauseous since stopping prednisone; not eating as much as before. Pt states she has reduced alcohol intake. Pt states overall she has not been feeling well lately.   Pt states she has been doing well  not drinking 15 minutes before eating, not while eating, and waiting 30 minutes after eating to drink. Pt states sometimes she forgets but very conscious while at work.   Pt states she is leaning more towards the RYGB due to having horrible reflux.   Pt states she likes the [employer] protein shakes (30g protein-chocolate and vanilla) and can tolerate Premier Protein as well as Atkins shakes. Pt states she has been able to have more vegetables with lunch and dinner such as peas, green beans, and salad. Pt states cauliflower and broccoli are not her favorite but snacks on them at times. Pt states she is consistently exceeding 64 ounces of fluid a day. Pt states she also keeps water bottle on nightstand to sip when waking up at night.   Pt tracks food and drink intake in personal notebook.   Per insurance, pt needs 6 SWL visits prior to surgery.  MEDICATIONS: See list   DIETARY INTAKE:  24-hr recall:  B ( AM): 1/2 greek yogurt (7g) or protein shake Snk ( AM): greek yogurt or fritos L ( PM): carrots and cucumbers or popcorn or mini ravioli or meatloaf, salad Snk ( PM): pickles or dark chocolate or tomatoes, cucumbers D ( PM): ham, potatoes, cabbage, carrots or cheese pizza, vegetable medley Snk ( PM): sometimes fruit Beverages: water, unsweetened tea (sometimes), red wine (occasionally)  Usual  physical activity: DVD's, walking, gardening, swimming 4 days/week  Diet to Follow: 1800 calories 200 g carbohydrates 135 g protein 50 g fat  Preferred Learning Style:   No preference indicated   Learning Readiness:   Ready  Change in progress     Nutritional Diagnosis:  [place]-3.3 Overweight/obesity related to past poor dietary habits and physical inactivity as evidenced by patient w/ planned RYGB/sleeve gastrectomy surgery following dietary guidelines for continued weight loss.    Intervention:  Nutrition counseling for upcoming Bariatric Surgery. Pt was also encouraged to maintain great habits already established and trying to get back on track with eating 3 meals a day.  Goals:  - Aim for 150 minutes of physical activity including cardio and weight bearing every week - Aim to get back on track on with eating 3 meals day.  - Aim to consistently have non-starchy vegetables with at least 1 meal a day.Non-starchy vegetables are any vegetable other than peas, corn, and potatoes.   Teaching Method Utilized:  Visual Auditory Hands on  Handouts given during visit include:  none  Barriers to learning/adherence to lifestyle change: none identified  Demonstrated degree of understanding via:  Teach Back   Monitoring/Evaluation:  Dietary intake, exercise, and body weight in 1 month(s).

## 2017-10-29 NOTE — Patient Instructions (Addendum)
-   Aim to get back on track on with eating 3 meals day.   - Aim to consistently have non-starchy vegetables with at least 1 meal a day.Non-starchy vegetables are any vegetable other than peas, corn, and potatoes.

## 2017-10-30 LAB — B. BURGDORFI ANTIBODIES

## 2017-10-30 LAB — ANA: Anti Nuclear Antibody(ANA): NEGATIVE

## 2017-11-03 ENCOUNTER — Encounter: Payer: Self-pay | Admitting: Family Medicine

## 2017-11-04 ENCOUNTER — Other Ambulatory Visit: Payer: Self-pay | Admitting: Physician Assistant

## 2017-11-04 ENCOUNTER — Encounter: Payer: Self-pay | Admitting: Emergency Medicine

## 2017-11-04 DIAGNOSIS — M940 Chondrocostal junction syndrome [Tietze]: Secondary | ICD-10-CM | POA: Diagnosis not present

## 2017-11-04 DIAGNOSIS — L409 Psoriasis, unspecified: Secondary | ICD-10-CM | POA: Diagnosis not present

## 2017-11-04 DIAGNOSIS — L4050 Arthropathic psoriasis, unspecified: Secondary | ICD-10-CM | POA: Diagnosis not present

## 2017-11-04 MED ORDER — DULOXETINE HCL 20 MG PO CPEP: 20.0000 mg | ORAL_CAPSULE | Freq: Every day | ORAL | 3 refills | Status: DC

## 2017-11-04 NOTE — Telephone Encounter (Signed)
Copied from [place] [id number]. Topic: Quick Communication - Office Called Patient >> Nov 03, 2017  3:42 PM Leonidas Romberg, CMA wrote: Reason for CRM:  LMOVM I really feel that her pain symptoms are related to tapering off of the prednisone as it may have been masking these symptoms. I would like for her to see rheumatology for further assessment. Can consider starting something like Cymbalta to help with symptoms while awaiting assessment.

## 2017-11-04 NOTE — Telephone Encounter (Signed)
Pt is calling back Because she has some Questions. Please advise Cb# [phone]. Office states it is okay for pec nurse to disclose

## 2017-11-10 ENCOUNTER — Ambulatory Visit: Payer: 59 | Admitting: Family Medicine

## 2017-11-10 ENCOUNTER — Other Ambulatory Visit: Payer: Self-pay

## 2017-11-10 ENCOUNTER — Encounter: Payer: Self-pay | Admitting: Family Medicine

## 2017-11-10 VITALS — BP 112/80 | HR 88 | Temp 97.9°F | Resp 15 | Ht 61.5 in | Wt 275.2 lb

## 2017-11-10 DIAGNOSIS — M797 Fibromyalgia: Secondary | ICD-10-CM

## 2017-11-10 DIAGNOSIS — D509 Iron deficiency anemia, unspecified: Secondary | ICD-10-CM

## 2017-11-10 DIAGNOSIS — E559 Vitamin D deficiency, unspecified: Secondary | ICD-10-CM | POA: Insufficient documentation

## 2017-11-10 LAB — B12 AND FOLATE PANEL
Folate: 11.5 ng/mL (ref >5.9)
Vitamin B-12: 401 pg/mL (ref 211–911)

## 2017-11-10 LAB — CBC WITH DIFFERENTIAL/PLATELET
BASOS ABS: 0.1 10*3/uL (ref 0.0–0.1)
Basophils Relative: 0.7 % (ref 0.0–3.0)
Eosinophils Absolute: 0.1 10*3/uL (ref 0.0–0.7)
Eosinophils Relative: 1 % (ref 0.0–5.0)
HCT: 38.3 % (ref 36.0–46.0)
Hemoglobin: 13 g/dL (ref 12.0–15.0)
LYMPHS PCT: 28.4 % (ref 12.0–46.0)
Lymphs Abs: 2.6 10*3/uL (ref 0.7–4.0)
MCHC: 33.9 g/dL (ref 30.0–36.0)
MCV: 86.8 fl (ref 78.0–100.0)
MONOS PCT: 6.6 % (ref 3.0–12.0)
Monocytes Absolute: 0.6 10*3/uL (ref 0.1–1.0)
NEUTROS ABS: 5.9 10*3/uL (ref 1.4–7.7)
Neutrophils Relative %: 63.3 % (ref 43.0–77.0)
PLATELETS: 385 10*3/uL (ref 150.0–400.0)
RBC: 4.42 Mil/uL (ref 3.87–5.11)
RDW: 18.3 % — ABNORMAL HIGH (ref 11.5–15.5)
WBC: 9.3 10*3/uL (ref 4.0–10.5)

## 2017-11-10 LAB — VITAMIN D 25 HYDROXY (VIT D DEFICIENCY, FRACTURES): VITD: 31.25 ng/mL (ref 30.00–100.00)

## 2017-11-10 NOTE — Progress Notes (Signed)
   Subjective:    Patient ID: Dawn Hayes, female    DOB: [birth year], 47 y.o.   MRN: [id number]  HPI Anemia- pt had iron infusion done in May.  Would like to repeat blood work.  Vit D deficiency- pt has hx of this, would like blood work repeated  Fibromyalgia- pt was told by Rheumatology that they feel there is a component of fibro in addition to her psoriatic arthritis.  Pt was started on low dose Cymbalta on 6/26 but was back on Prednisone after 2 days.  She reports that when she was off her prednisone, 'I was miserable all day every day'.  Pt reports 'random stabbing pains all over'. Pt is now wondering if her 'costochondritis' is really a fibro flare.   Review of Systems For ROS see HPI     Objective:   Physical Exam  Constitutional: She appears well-developed and well-nourished. No distress.  obese  HENT:  Head: Normocephalic and atraumatic.  Eyes: Pupils are equal, round, and reactive to light. Conjunctivae and EOM are normal.  Neck: Normal range of motion. Neck supple. No thyromegaly present.  Musculoskeletal: She exhibits tenderness (TTP over multiple trigger points).  Lymphadenopathy:    She has no cervical adenopathy.  Neurological: No cranial nerve deficit. She exhibits normal muscle tone. Coordination normal.  Skin: Skin is warm and dry. No rash noted. No erythema.  Vitals reviewed.         Assessment & Plan:

## 2017-11-10 NOTE — Patient Instructions (Signed)
Follow up as needed or as scheduled We'll notify you of your lab results and make any changes if needed Continue the Cymbalta daily- we may need to increase this in 1 month to improve pain control Call with any questions or concerns Hang in there!!!

## 2017-11-11 LAB — IRON,TIBC AND FERRITIN PANEL
%SAT: 17 % (calc) (ref 16–45)
FERRITIN: 43 ng/mL (ref 16–232)
Iron: 52 ug/dL (ref 40–190)
TIBC: 311 mcg/dL (calc) (ref 250–450)

## 2017-11-17 NOTE — Assessment & Plan Note (Signed)
Pt has hx of this, check labs and replete prn.

## 2017-11-17 NOTE — Assessment & Plan Note (Signed)
New.  Pt was dx'd by Rheumatology.  Just recently started on Cymbalta but had to resume prednisone almost immediately- so not able to determine if Cymbalta has had any impact on pain level.  Pt has multiple tender trigger points, indicating that Rheum's dx of Fibro is likely.  Discussed need for sleep, regular exercise, control of anxiety/depression if present and trying to limit amount of prednisone.  Will follow along.

## 2017-11-17 NOTE — Assessment & Plan Note (Signed)
Pt has hx of this.  Required iron infusion in May.  Asking for repeat CBC.

## 2017-11-18 ENCOUNTER — Ambulatory Visit: Payer: Commercial Managed Care - HMO | Admitting: Obstetrics and Gynecology

## 2017-11-20 ENCOUNTER — Encounter: Payer: Self-pay | Admitting: Family Medicine

## 2017-11-20 ENCOUNTER — Other Ambulatory Visit: Payer: Self-pay | Admitting: Family Medicine

## 2017-11-20 MED ORDER — PREGABALIN 75 MG PO CAPS: 75.0000 mg | ORAL_CAPSULE | Freq: Two times a day (BID) | ORAL | 1 refills | Status: DC

## 2017-11-20 NOTE — Progress Notes (Signed)
Prescription sent

## 2017-11-26 ENCOUNTER — Encounter: Payer: Self-pay | Admitting: Registered"

## 2017-11-26 ENCOUNTER — Encounter: Payer: 59 | Attending: General Surgery | Admitting: Registered"

## 2017-11-26 ENCOUNTER — Ambulatory Visit (INDEPENDENT_AMBULATORY_CARE_PROVIDER_SITE_OTHER): Payer: 59 | Admitting: Psychiatry

## 2017-11-26 DIAGNOSIS — Z79899 Other long term (current) drug therapy: Secondary | ICD-10-CM | POA: Diagnosis not present

## 2017-11-26 DIAGNOSIS — Z6841 Body Mass Index (BMI) 40.0 and over, adult: Secondary | ICD-10-CM | POA: Diagnosis not present

## 2017-11-26 DIAGNOSIS — Z713 Dietary counseling and surveillance: Secondary | ICD-10-CM | POA: Diagnosis not present

## 2017-11-26 DIAGNOSIS — E669 Obesity, unspecified: Secondary | ICD-10-CM

## 2017-11-26 DIAGNOSIS — Z886 Allergy status to analgesic agent status: Secondary | ICD-10-CM | POA: Diagnosis not present

## 2017-11-26 DIAGNOSIS — F509 Eating disorder, unspecified: Secondary | ICD-10-CM

## 2017-11-26 NOTE — Progress Notes (Signed)
RYGB/Sleeve Gastrectomy Appt start time: 2:55 end time: 3:15  Assessment: 6th SWL Appointment.   Start Wt at [facility]: 273.5 Wt: 272.9 BMI: 49.91   Pt arrives having lost about 1.7 lbs from previous visit.   Pt states she may have fibromyalgia. Pt states she has been eating more vegetables: squash, zucchini, tomatoes, and green beans. Pt states she has 2 gardens. Pt states has started back eating 3 meals a day. Pt has made great behavioral changes!  Pt states she has reduced alcohol intake. Pt states she has been doing well not drinking 15 minutes before eating, not while eating, and waiting 30 minutes after eating to drink. Pt states sometimes she forgets but very conscious while at work.   Pt states she is leaning more towards the RYGB due to having horrible reflux.   Pt states she likes the [employer] protein shakes (30g protein-chocolate and vanilla) and can tolerate Premier Protein as well as Atkins shakes. Pt states she has been able to have more vegetables with lunch and dinner such as peas, green beans, and salad. Pt states cauliflower and broccoli are not her favorite but snacks on them at times. Pt states she is consistently exceeding 64 ounces of fluid a day. Pt states she also keeps water bottle on nightstand to sip when waking up at night.   Pt tracks food and drink intake in personal notebook.   Per insurance, pt needs 6 SWL visits prior to surgery.  MEDICATIONS: See list   DIETARY INTAKE:  24-hr recall:  B ( AM): veggie omelette with tomato Snk ( AM): 2 homemade pickles L ( PM): cucumbers, carrots, cauliflower, tomatoes with hummus Snk ( PM): caprese salad D ( PM): [place] burgers, grilled zucchini, squash Snk ( PM): none Beverages: water (4 16.9 oz bottles), unsweetened tea (sometimes), red wine (occasionally)  Usual physical activity: DVD's, walking, gardening, swimming 4 days/week  Diet to Follow: 1800 calories 200 g carbohydrates 135 g protein 50 g  fat  Preferred Learning Style:   No preference indicated   Learning Readiness:   Ready  Change in progress     Nutritional Diagnosis:  [place]-3.3 Overweight/obesity related to past poor dietary habits and physical inactivity as evidenced by patient w/ planned RYGB/sleeve gastrectomy surgery following dietary guidelines for continued weight loss.    Intervention:  Nutrition counseling for upcoming Bariatric Surgery. Pt was also encouraged to maintain great habits already established. Pt was in agreement with goals listed.   Goals:  - Aim for 150 minutes of physical activity including cardio and weight bearing every week - Support group is every 3rd Thursday of the month, 6-7 pm in classroom 1.  - Keep up the great work!   Teaching Method Utilized:  Visual Auditory Hands on  Handouts given during visit include:  none  Barriers to learning/adherence to lifestyle change: none identified  Demonstrated degree of understanding via:  Teach Back   Monitoring/Evaluation:  Dietary intake, exercise, and body weight prn.

## 2017-11-26 NOTE — Patient Instructions (Addendum)
-   Support group is every 3rd Thursday of the month, 6-7 pm in classroom 1.   - Keep up the great work!

## 2017-11-27 ENCOUNTER — Other Ambulatory Visit: Payer: Self-pay | Admitting: General Practice

## 2017-11-27 ENCOUNTER — Encounter: Payer: Self-pay | Admitting: Family Medicine

## 2017-11-27 DIAGNOSIS — M545 Low back pain, unspecified: Secondary | ICD-10-CM

## 2017-11-27 DIAGNOSIS — L4050 Arthropathic psoriasis, unspecified: Secondary | ICD-10-CM

## 2017-11-27 DIAGNOSIS — M797 Fibromyalgia: Secondary | ICD-10-CM

## 2017-11-27 MED ORDER — DULOXETINE HCL 30 MG PO CPEP: 30.0000 mg | ORAL_CAPSULE | Freq: Every day | ORAL | 3 refills | Status: DC

## 2017-11-30 ENCOUNTER — Encounter: Payer: 59 | Admitting: Skilled Nursing Facility1

## 2017-11-30 DIAGNOSIS — Z713 Dietary counseling and surveillance: Secondary | ICD-10-CM | POA: Diagnosis not present

## 2017-12-01 ENCOUNTER — Ambulatory Visit: Payer: 59 | Admitting: Obstetrics and Gynecology

## 2017-12-01 ENCOUNTER — Encounter: Payer: Self-pay | Admitting: Skilled Nursing Facility1

## 2017-12-01 NOTE — Progress Notes (Signed)
Pre-Operative Nutrition Class:  Appt start time: 4332   End time:  1830.  Patient was seen on 11/30/2017 for Pre-Operative Bariatric Surgery Education at [facility].   Surgery date:  Surgery type: RYGB Start weight at [facility]: 273.5 Weight today: 273  Samples given per MNT protocol. Patient educated on appropriate usage: Bariatric Advantage Multivitamin Lot # [id number]  Exp: 10/20  Bariatric Advantage Calcium  Lot # [id number] Exp: jan-21-2020  Premier Protein  Shake Lot # [id number] Exp: August 18, 2018   The following the learning objectives were met by the patient during this course:  Identify Pre-Op Dietary Goals and will begin 2 weeks pre-operatively  Identify appropriate sources of fluids and proteins   State protein recommendations and appropriate sources pre and post-operatively  Identify Post-Operative Dietary Goals and will follow for 2 weeks post-operatively  Identify appropriate multivitamin and calcium sources  Describe the need for physical activity post-operatively and will follow MD recommendations  State when to call healthcare provider regarding medication questions or post-operative complications  Handouts given during class include:  Pre-Op Bariatric Surgery Diet Handout  Protein Shake Handout  Post-Op Bariatric Surgery Nutrition Handout  BELT Program Information Flyer  Support Group Information Flyer  [facility] Bariatric Supplements Price List  Follow-Up Plan: Patient will follow-up at [facility] 2 weeks post operatively for diet advancement per MD.

## 2017-12-02 ENCOUNTER — Ambulatory Visit: Payer: Self-pay | Admitting: Registered"

## 2017-12-03 ENCOUNTER — Other Ambulatory Visit: Payer: Self-pay | Admitting: Physician Assistant

## 2017-12-03 NOTE — Telephone Encounter (Signed)
Last OV 11/10/17, No future OV  Last filled 10/28/17, # 20 with 0 refills

## 2017-12-03 NOTE — Telephone Encounter (Signed)
Einar Pheasant, please assess appropriateness of cod#4 request. I have reviewed chart; you evaluated her pain in June and started this I believe. Thank you.

## 2017-12-04 DIAGNOSIS — Z720 Tobacco use: Secondary | ICD-10-CM | POA: Diagnosis not present

## 2017-12-14 DIAGNOSIS — G894 Chronic pain syndrome: Secondary | ICD-10-CM | POA: Diagnosis not present

## 2017-12-14 DIAGNOSIS — Z79891 Long term (current) use of opiate analgesic: Secondary | ICD-10-CM | POA: Diagnosis not present

## 2017-12-14 DIAGNOSIS — M797 Fibromyalgia: Secondary | ICD-10-CM | POA: Diagnosis not present

## 2017-12-14 DIAGNOSIS — Z79899 Other long term (current) drug therapy: Secondary | ICD-10-CM | POA: Diagnosis not present

## 2017-12-14 DIAGNOSIS — M47816 Spondylosis without myelopathy or radiculopathy, lumbar region: Secondary | ICD-10-CM | POA: Diagnosis not present

## 2017-12-30 ENCOUNTER — Ambulatory Visit: Payer: Self-pay | Admitting: Family Medicine

## 2017-12-30 DIAGNOSIS — M546 Pain in thoracic spine: Secondary | ICD-10-CM | POA: Diagnosis not present

## 2017-12-31 ENCOUNTER — Ambulatory Visit: Payer: Self-pay | Admitting: Family Medicine

## 2017-12-31 ENCOUNTER — Ambulatory Visit: Payer: 59 | Admitting: Family Medicine

## 2017-12-31 ENCOUNTER — Other Ambulatory Visit: Payer: Self-pay

## 2017-12-31 ENCOUNTER — Encounter: Payer: Self-pay | Admitting: Family Medicine

## 2017-12-31 VITALS — BP 122/80 | HR 118 | Temp 99.4°F | Resp 18 | Ht 61.5 in | Wt 264.0 lb

## 2017-12-31 DIAGNOSIS — M940 Chondrocostal junction syndrome [Tietze]: Secondary | ICD-10-CM

## 2017-12-31 DIAGNOSIS — M797 Fibromyalgia: Secondary | ICD-10-CM | POA: Diagnosis not present

## 2017-12-31 MED ORDER — METHYLPREDNISOLONE ACETATE 80 MG/ML IJ SUSP
80.0000 mg | Freq: Once | INTRAMUSCULAR | Status: AC
  Administered 2017-12-31: 80 mg via INTRAMUSCULAR

## 2017-12-31 MED ORDER — DULOXETINE HCL 60 MG PO CPEP: 60.0000 mg | ORAL_CAPSULE | Freq: Every day | ORAL | 3 refills | Status: DC

## 2017-12-31 NOTE — Assessment & Plan Note (Signed)
Recurrent problem for pt.  She presents dramatically which is her typical presentation.  She has been sent to the ER for a complete workup when presenting in this manner and everything was negative.  I consulted with another provider who has seen for this previously and this presentation is consistent.  She is hoping to avoid a prednisone taper at the request of her bariatric surgeon.  Will do DepoMedrol injxn here in office and if she doesn't have relief by tomorrow afternoon, will start Pred taper.  Reviewed supportive care and red flags that should prompt return.  Pt expressed understanding and is in agreement w/ plan.

## 2017-12-31 NOTE — Assessment & Plan Note (Signed)
Ongoing.  She was not able to tolerate Lyrica due to side effects.  Will increase Cymbalta to 60mg  daily and continue to monitor.  Pt expressed understanding and is in agreement w/ plan.

## 2017-12-31 NOTE — Progress Notes (Signed)
   Subjective:    Patient ID: Dawn Hayes, female    DOB: 1970-12-07, 47 y.o.   MRN: [id number]  HPI 'i'm have a flare of costochondritis or fibro or whatever'- sxs started yesterday, 'today it's bad'.  Taking Norco TID w/ minimal relief per pain management.  Pt reports sxs started on R side and have spread across chest- 'shooting pain up into my shoulder (L)'.  'I just can't get a good breath'- due to pain.  Pain w/ movement.  Taking Celebrex daily.  Had to stop Lyrica after a few days due to palpitations and dizziness.  Pt is on Cymbalta 30mg - has never been on 60mg  dose.  Pt has been off Prednisone since mid-July.   Review of Systems For ROS see HPI     Objective:   Physical Exam  Constitutional: She is oriented to person, place, and time. She appears distressed (tearful, anxious).  HENT:  Head: Normocephalic and atraumatic.  Cardiovascular: Regular rhythm, normal heart sounds and intact distal pulses.  Tachy but regular  Pulmonary/Chest: Breath sounds normal. No stridor. No respiratory distress. She has no wheezes. She exhibits tenderness (TTP over L sternoclavicular joint, sternum).  Poor effort due to pain  Neurological: She is alert and oriented to person, place, and time.  Skin: Skin is warm and dry. No rash noted. No erythema.  Psychiatric:  Anxious, tearful  Vitals reviewed.         Assessment & Plan:

## 2017-12-31 NOTE — Patient Instructions (Signed)
Follow up as needed or as scheduled Alternate heat/ice for pain relief If no improvement in pain by tomorrow at lunch, please let me know and we can send in Prednisone taper Increase the Cymbalta to 60mg  daily- 2 of what you have at home and 1 of the new prescription If your shortness of breath worsens, please go to the ER Call with any questions or concerns Hang in there!

## 2018-01-01 MED ORDER — PREDNISONE 10 MG PO TABS: ORAL_TABLET | ORAL | 0 refills | Status: DC

## 2018-01-05 DIAGNOSIS — M545 Low back pain: Secondary | ICD-10-CM | POA: Diagnosis not present

## 2018-01-05 DIAGNOSIS — M79609 Pain in unspecified limb: Secondary | ICD-10-CM | POA: Diagnosis not present

## 2018-01-07 ENCOUNTER — Encounter: Payer: Self-pay | Admitting: Family Medicine

## 2018-01-08 NOTE — Progress Notes (Signed)
ekg 06-10-17 epic  CT chest 09-03-17 epic

## 2018-01-08 NOTE — Patient Instructions (Addendum)
Dawn Hayes  01/08/2018   Your procedure is scheduled on: 01-19-18  Report to [facility] Main  Entrance              Report to admitting at     [place] AM    Call this number if you have problems the morning of surgery [phone]    Remember: Do not eat food  :After Midnight.   MORNING OF SURGERY DRINK:  1SHAKE BEFORE YOU LEAVE HOME, DRINK ALL OF THE SHAKE AT ONE TIME.   NO SOLID FOOD AFTER 600 PM THE NIGHT BEFORE YOUR SURGERY. YOU MAY DRINK CLEAR FLUIDS. THE SHAKE YOU DRINK BEFORE YOU LEAVE HOME WILL BE THE LAST FLUIDS YOU DRINK BEFORE SURGERY.  PAIN IS EXPECTED AFTER SURGERY AND WILL NOT BE COMPLETELY ELIMINATED. AMBULATION AND TYLENOL WILL HELP REDUCE INCISIONAL AND GAS PAIN. MOVEMENT IS KEY!  YOU ARE EXPECTED TO BE OUT OF BED WITHIN 4 HOURS OF ADMISSION TO YOUR PATIENT ROOM.  SITTING IN THE RECLINER THROUGHOUT THE DAY IS IMPORTANT FOR DRINKING FLUIDS AND MOVING GAS THROUGHOUT THE GI TRACT.  COMPRESSION STOCKINGS SHOULD BE WORN [place] UNLESS YOU ARE WALKING.   INCENTIVE SPIROMETER SHOULD BE USED EVERY HOUR WHILE AWAKE TO DECREASE POST-OPERATIVE COMPLICATIONS SUCH AS PNEUMONIA.  WHEN DISCHARGED HOME, IT IS IMPORTANT TO CONTINUE TO WALK EVERY HOUR AND USE THE INCENTIVE SPIROMETER EVERY HOUR.      CLEAR LIQUID DIET   Foods Allowed                                                                     Foods Excluded  Coffee and tea, regular and decaf                             liquids that you cannot  Plain Jell-O in any flavor                                             see through such as: Fruit ices (not with fruit pulp)                                     milk, soups, orange juice  Iced Popsicles                                    All solid food Carbonated beverages, regular and diet                                    Cranberry, grape and apple juices Sports drinks like Gatorade Lightly seasoned clear broth or consume(fat  free) Sugar, honey syrup  Sample Menu Breakfast  Lunch                                     Supper Cranberry juice                    Beef broth                            Chicken broth Jell-O                                     Grape juice                           Apple juice Coffee or tea                        Jell-O                                      Popsicle                                                Coffee or tea                        Coffee or tea  _____________________________________________________________________          Take these medicines the morning of surgery with A SIP OF WATER: Esomeprazole (Nexium), use eye drops                                You may not have any metal on your body including hair pins and              piercings  Do not wear jewelry, make-up, lotions, powders or perfumes, deodorant             Do not wear nail polish.  Do not shave  48 hours prior to surgery.     Do not bring valuables to the hospital. [place].  Contacts, dentures or bridgework may not be worn into surgery.  Leave suitcase in the car. After surgery it may be brought to your room.                   Please read over the following fact sheets you were given: _____________________________________________________________________           [facility] - Preparing for Surgery Before surgery, you can play an important role.  Because skin is not sterile, your skin needs to be as free of germs as possible.  You can reduce the number of germs on your skin by washing with CHG (chlorahexidine gluconate) soap before surgery.  CHG is an antiseptic cleaner which kills germs and bonds with the skin to continue killing germs even after washing. Please DO NOT use if you have an allergy  to CHG or antibacterial soaps.  If your skin becomes reddened/irritated stop using the CHG and inform your nurse when you  arrive at Short Stay. Do not shave (including legs and underarms) for at least 48 hours prior to the first CHG shower.  You may shave your face/neck. Please follow these instructions carefully:  1.  Shower with CHG Soap the night before surgery and the  morning of Surgery.  2.  If you choose to wash your hair, wash your hair first as usual with your  normal  shampoo.  3.  After you shampoo, rinse your hair and body thoroughly to remove the  shampoo.                           4.  Use CHG as you would any other liquid soap.  You can apply chg directly  to the skin and wash                       Gently with a scrungie or clean washcloth.  5.  Apply the CHG Soap to your body ONLY FROM THE NECK DOWN.   Do not use on face/ open                           Wound or open sores. Avoid contact with eyes, ears mouth and genitals (private parts).                       Wash face,  Genitals (private parts) with your normal soap.             6.  Wash thoroughly, paying special attention to the area where your surgery  will be performed.  7.  Thoroughly rinse your body with warm water from the neck down.  8.  DO NOT shower/wash with your normal soap after using and rinsing off  the CHG Soap.                9.  Pat yourself dry with a clean towel.            10.  Wear clean pajamas.            11.  Place clean sheets on your bed the night of your first shower and do not  sleep with pets. Day of Surgery : Do not apply any lotions/deodorants the morning of surgery.  Please wear clean clothes to the hospital/surgery center.  FAILURE TO FOLLOW THESE INSTRUCTIONS MAY RESULT IN THE CANCELLATION OF YOUR SURGERY PATIENT SIGNATURE_________________________________  NURSE SIGNATURE__________________________________  ________________________________________________________________________

## 2018-01-12 ENCOUNTER — Encounter (HOSPITAL_COMMUNITY): Payer: Self-pay

## 2018-01-12 ENCOUNTER — Encounter (HOSPITAL_COMMUNITY)
Admission: RE | Admit: 2018-01-12 | Discharge: 2018-01-12 | Disposition: A | Discharge status: Home / Self Care | Payer: 59 | Source: Ambulatory Visit | Attending: General Surgery | Admitting: General Surgery

## 2018-01-12 ENCOUNTER — Other Ambulatory Visit: Payer: Self-pay

## 2018-01-12 ENCOUNTER — Ambulatory Visit: Payer: Self-pay | Admitting: General Surgery

## 2018-01-12 DIAGNOSIS — M797 Fibromyalgia: Secondary | ICD-10-CM | POA: Diagnosis not present

## 2018-01-12 DIAGNOSIS — Z79891 Long term (current) use of opiate analgesic: Secondary | ICD-10-CM | POA: Diagnosis not present

## 2018-01-12 DIAGNOSIS — Z01812 Encounter for preprocedural laboratory examination: Secondary | ICD-10-CM | POA: Diagnosis not present

## 2018-01-12 DIAGNOSIS — Z79899 Other long term (current) drug therapy: Secondary | ICD-10-CM | POA: Diagnosis not present

## 2018-01-12 DIAGNOSIS — M47816 Spondylosis without myelopathy or radiculopathy, lumbar region: Secondary | ICD-10-CM | POA: Diagnosis not present

## 2018-01-12 DIAGNOSIS — G894 Chronic pain syndrome: Secondary | ICD-10-CM | POA: Diagnosis not present

## 2018-01-12 HISTORY — DX: Fibromyalgia: M79.7

## 2018-01-12 LAB — CBC WITH DIFFERENTIAL/PLATELET
BASOS ABS: 0 10*3/uL (ref 0.0–0.1)
Basophils Relative: 0 %
Eosinophils Absolute: 0.2 10*3/uL (ref 0.0–0.7)
Eosinophils Relative: 1 %
HEMATOCRIT: 42.5 % (ref 36.0–46.0)
Hemoglobin: 13.9 g/dL (ref 12.0–15.0)
LYMPHS PCT: 24 %
Lymphs Abs: 2.8 10*3/uL (ref 0.7–4.0)
MCH: 29.2 pg (ref 26.0–34.0)
MCHC: 32.7 g/dL (ref 30.0–36.0)
MCV: 89.3 fL (ref 78.0–100.0)
Monocytes Absolute: 0.6 10*3/uL (ref 0.1–1.0)
Monocytes Relative: 5 %
NEUTROS ABS: 8.2 10*3/uL — AB (ref 1.7–7.7)
NEUTROS PCT: 70 %
Platelets: 493 10*3/uL — ABNORMAL HIGH (ref 150–400)
RBC: 4.76 MIL/uL (ref 3.87–5.11)
RDW: 14.4 % (ref 11.5–15.5)
WBC: 11.8 10*3/uL — AB (ref 4.0–10.5)

## 2018-01-12 LAB — COMPREHENSIVE METABOLIC PANEL
ALT: 32 U/L (ref 0–44)
AST: 56 U/L — AB (ref 15–41)
Albumin: 3.6 g/dL (ref 3.5–5.0)
Alkaline Phosphatase: 66 U/L (ref 38–126)
Anion gap: 13 (ref 5–15)
BILIRUBIN TOTAL: 2.1 mg/dL — AB (ref 0.3–1.2)
BUN: 24 mg/dL — AB (ref 6–20)
CHLORIDE: 97 mmol/L — AB (ref 98–111)
CO2: 28 mmol/L (ref 22–32)
Calcium: 9.3 mg/dL (ref 8.9–10.3)
Creatinine, Ser: 0.74 mg/dL (ref 0.44–1.00)
GFR calc Af Amer: >60 mL/min (ref >60)
GLUCOSE: 94 mg/dL (ref 70–99)
Potassium: 4.5 mmol/L (ref 3.5–5.1)
Sodium: 138 mmol/L (ref 135–145)
Total Protein: 6.7 g/dL (ref 6.5–8.1)

## 2018-01-13 LAB — ABO/RH: ABO/RH(D): A POS

## 2018-01-14 ENCOUNTER — Other Ambulatory Visit: Payer: Self-pay | Admitting: Pain Medicine

## 2018-01-14 DIAGNOSIS — M546 Pain in thoracic spine: Secondary | ICD-10-CM

## 2018-01-18 MED ORDER — BUPIVACAINE LIPOSOME 1.3 % IJ SUSP
20.0000 mL | INTRAMUSCULAR | Status: DC
  Filled 2018-01-18: qty 20

## 2018-01-18 NOTE — Anesthesia Preprocedure Evaluation (Addendum)
Anesthesia Evaluation  Patient identified by MRN, date of birth, ID band Patient awake    Reviewed: Allergy & Precautions, NPO status , Patient's Chart, lab work & pertinent test results  Airway Mallampati: III  TM Distance: <3 FB Neck ROM: Full    Dental  (+) Dental Advisory Given   Pulmonary former smoker,    breath sounds clear to auscultation       Cardiovascular hypertension, Pt. on medications  Rhythm:Regular Rate:Normal     Neuro/Psych  Headaches, Anxiety    GI/Hepatic Neg liver ROS, hiatal hernia, GERD  ,  Endo/Other  Morbid obesity  Renal/GU negative Renal ROS     Musculoskeletal  (+) Arthritis , Fibromyalgia -  Abdominal   Peds  Hematology negative hematology ROS (+)   Anesthesia Other Findings   Reproductive/Obstetrics                            Lab Results  Component Value Date   WBC 11.8 (H) 01/12/2018   HGB 13.9 01/12/2018   HCT 42.5 01/12/2018   MCV 89.3 01/12/2018   PLT 493 (H) 01/12/2018   Lab Results  Component Value Date   CREATININE 0.74 01/12/2018   BUN 24 (H) 01/12/2018   NA 138 01/12/2018   K 4.5 01/12/2018   CL 97 (L) 01/12/2018   CO2 28 01/12/2018    Anesthesia Physical Anesthesia Plan  ASA: III  Anesthesia Plan: General   Post-op Pain Management:    Induction: Intravenous  PONV Risk Score and Plan: 3 and Ondansetron, Dexamethasone, Scopolamine patch - Pre-op and Treatment may vary due to age or medical condition  Airway Management Planned: Oral ETT  Additional Equipment:   Intra-op Plan:   Post-operative Plan: Extubation in OR  Informed Consent: I have reviewed the patients History and Physical, chart, labs and discussed the procedure including the risks, benefits and alternatives for the proposed anesthesia with the patient or authorized representative who has indicated his/her understanding and acceptance.   Dental advisory  given  Plan Discussed with: CRNA  Anesthesia Plan Comments:        Anesthesia Quick Evaluation

## 2018-01-19 ENCOUNTER — Other Ambulatory Visit: Payer: Self-pay

## 2018-01-19 ENCOUNTER — Encounter (HOSPITAL_COMMUNITY): Payer: Self-pay

## 2018-01-19 ENCOUNTER — Inpatient Hospital Stay (HOSPITAL_COMMUNITY): Payer: 59 | Admitting: Anesthesiology

## 2018-01-19 ENCOUNTER — Inpatient Hospital Stay (HOSPITAL_COMMUNITY)
Admission: RE | Admit: 2018-01-19 | Discharge: 2018-01-21 | DRG: 621 | Disposition: A | Discharge status: Home / Self Care | Payer: 59 | Attending: General Surgery | Admitting: General Surgery

## 2018-01-19 ENCOUNTER — Encounter (HOSPITAL_COMMUNITY)
Admission: RE | Disposition: A | Discharge status: Home / Self Care | Payer: Self-pay | Source: Home / Self Care | Attending: General Surgery

## 2018-01-19 DIAGNOSIS — Z23 Encounter for immunization: Secondary | ICD-10-CM

## 2018-01-19 DIAGNOSIS — M5124 Other intervertebral disc displacement, thoracic region: Secondary | ICD-10-CM | POA: Diagnosis present

## 2018-01-19 DIAGNOSIS — F17210 Nicotine dependence, cigarettes, uncomplicated: Secondary | ICD-10-CM | POA: Diagnosis present

## 2018-01-19 DIAGNOSIS — Z6841 Body Mass Index (BMI) 40.0 and over, adult: Secondary | ICD-10-CM | POA: Diagnosis not present

## 2018-01-19 DIAGNOSIS — L4050 Arthropathic psoriasis, unspecified: Secondary | ICD-10-CM | POA: Diagnosis not present

## 2018-01-19 DIAGNOSIS — J309 Allergic rhinitis, unspecified: Secondary | ICD-10-CM | POA: Diagnosis present

## 2018-01-19 DIAGNOSIS — D509 Iron deficiency anemia, unspecified: Secondary | ICD-10-CM | POA: Diagnosis present

## 2018-01-19 DIAGNOSIS — Z9049 Acquired absence of other specified parts of digestive tract: Secondary | ICD-10-CM

## 2018-01-19 DIAGNOSIS — Z973 Presence of spectacles and contact lenses: Secondary | ICD-10-CM | POA: Diagnosis not present

## 2018-01-19 DIAGNOSIS — Z7982 Long term (current) use of aspirin: Secondary | ICD-10-CM

## 2018-01-19 DIAGNOSIS — Z79899 Other long term (current) drug therapy: Secondary | ICD-10-CM

## 2018-01-19 DIAGNOSIS — Z90710 Acquired absence of both cervix and uterus: Secondary | ICD-10-CM

## 2018-01-19 DIAGNOSIS — I10 Essential (primary) hypertension: Secondary | ICD-10-CM | POA: Diagnosis not present

## 2018-01-19 DIAGNOSIS — K219 Gastro-esophageal reflux disease without esophagitis: Secondary | ICD-10-CM | POA: Diagnosis present

## 2018-01-19 DIAGNOSIS — M940 Chondrocostal junction syndrome [Tietze]: Secondary | ICD-10-CM | POA: Diagnosis present

## 2018-01-19 DIAGNOSIS — K449 Diaphragmatic hernia without obstruction or gangrene: Secondary | ICD-10-CM | POA: Diagnosis not present

## 2018-01-19 DIAGNOSIS — Z9884 Bariatric surgery status: Secondary | ICD-10-CM

## 2018-01-19 DIAGNOSIS — G8929 Other chronic pain: Secondary | ICD-10-CM | POA: Diagnosis present

## 2018-01-19 DIAGNOSIS — M797 Fibromyalgia: Secondary | ICD-10-CM | POA: Diagnosis present

## 2018-01-19 DIAGNOSIS — R110 Nausea: Secondary | ICD-10-CM | POA: Diagnosis not present

## 2018-01-19 DIAGNOSIS — K660 Peritoneal adhesions (postprocedural) (postinfection): Secondary | ICD-10-CM | POA: Diagnosis present

## 2018-01-19 HISTORY — PX: LAPAROSCOPIC LYSIS OF ADHESIONS: SHX5905

## 2018-01-19 HISTORY — PX: GASTRIC ROUX-EN-Y: SHX5262

## 2018-01-19 LAB — TYPE AND SCREEN
ABO/RH(D): A POS
Antibody Screen: NEGATIVE

## 2018-01-19 LAB — HEMOGLOBIN AND HEMATOCRIT, BLOOD
HEMATOCRIT: 37.8 % (ref 36.0–46.0)
Hemoglobin: 12.4 g/dL (ref 12.0–15.0)

## 2018-01-19 LAB — PREGNANCY, URINE: PREG TEST UR: NEGATIVE

## 2018-01-19 SURGERY — LAPAROSCOPIC ROUX-EN-Y GASTRIC BYPASS WITH UPPER ENDOSCOPY
Anesthesia: General | Site: Abdomen

## 2018-01-19 MED ORDER — SODIUM CHLORIDE 0.9 % IJ SOLN
INTRAMUSCULAR | Status: AC
  Filled 2018-01-19: qty 10

## 2018-01-19 MED ORDER — POLYVINYL ALCOHOL 1.4 % OP SOLN
1.0000 [drp] | OPHTHALMIC | Status: DC | PRN
  Filled 2018-01-19: qty 15

## 2018-01-19 MED ORDER — STERILE WATER FOR IRRIGATION IR SOLN
Status: DC | PRN
  Administered 2018-01-19: 2000 mL

## 2018-01-19 MED ORDER — LIFITEGRAST 5 % OP SOLN: 1.0000 [drp] | Freq: Every day | OPHTHALMIC | Status: DC | PRN

## 2018-01-19 MED ORDER — ONDANSETRON HCL 4 MG/2ML IJ SOLN
4.0000 mg | Freq: Four times a day (QID) | INTRAMUSCULAR | Status: DC | PRN
  Administered 2018-01-19 – 2018-01-20 (×3): 4 mg via INTRAVENOUS
  Filled 2018-01-19 (×3): qty 2

## 2018-01-19 MED ORDER — KCL IN DEXTROSE-NACL 20-5-0.45 MEQ/L-%-% IV SOLN
INTRAVENOUS | Status: DC
  Administered 2018-01-19 – 2018-01-21 (×5): via INTRAVENOUS
  Filled 2018-01-19 (×5): qty 1000

## 2018-01-19 MED ORDER — PREMIER PROTEIN SHAKE
2.0000 [oz_av] | ORAL | Status: DC
  Administered 2018-01-20 – 2018-01-21 (×6): 2 [oz_av] via ORAL

## 2018-01-19 MED ORDER — FENTANYL CITRATE (PF) 250 MCG/5ML IJ SOLN
INTRAMUSCULAR | Status: AC
  Filled 2018-01-19: qty 5

## 2018-01-19 MED ORDER — SODIUM CHLORIDE 0.9 % IJ SOLN
INTRAMUSCULAR | Status: DC | PRN
  Administered 2018-01-19: 50 mL

## 2018-01-19 MED ORDER — PROPOFOL 10 MG/ML IV BOLUS
INTRAVENOUS | Status: DC | PRN
  Administered 2018-01-19: 200 mg via INTRAVENOUS

## 2018-01-19 MED ORDER — SCOPOLAMINE 1 MG/3DAYS TD PT72
1.0000 | MEDICATED_PATCH | TRANSDERMAL | Status: DC
  Administered 2018-01-19: 1.5 mg via TRANSDERMAL
  Filled 2018-01-19: qty 1

## 2018-01-19 MED ORDER — HEPARIN SODIUM (PORCINE) 5000 UNIT/ML IJ SOLN
5000.0000 [IU] | INTRAMUSCULAR | Status: AC
  Administered 2018-01-19: 5000 [IU] via SUBCUTANEOUS
  Filled 2018-01-19: qty 1

## 2018-01-19 MED ORDER — ONDANSETRON HCL 4 MG/2ML IJ SOLN
INTRAMUSCULAR | Status: AC
  Filled 2018-01-19: qty 2

## 2018-01-19 MED ORDER — LIDOCAINE 20MG/ML (2%) 15 ML SYRINGE OPTIME
INTRAMUSCULAR | Status: DC | PRN
  Administered 2018-01-19: 1.5 mg/kg/h via INTRAVENOUS

## 2018-01-19 MED ORDER — MIDAZOLAM HCL 2 MG/2ML IJ SOLN
INTRAMUSCULAR | Status: AC
  Filled 2018-01-19: qty 2

## 2018-01-19 MED ORDER — ONDANSETRON HCL 4 MG/2ML IJ SOLN
INTRAMUSCULAR | Status: DC | PRN
  Administered 2018-01-19: 4 mg via INTRAVENOUS

## 2018-01-19 MED ORDER — GABAPENTIN 300 MG PO CAPS
300.0000 mg | ORAL_CAPSULE | ORAL | Status: AC
  Administered 2018-01-19: 300 mg via ORAL
  Filled 2018-01-19: qty 1

## 2018-01-19 MED ORDER — MIDAZOLAM HCL 5 MG/5ML IJ SOLN
INTRAMUSCULAR | Status: DC | PRN
  Administered 2018-01-19: 2 mg via INTRAVENOUS

## 2018-01-19 MED ORDER — FENTANYL CITRATE (PF) 100 MCG/2ML IJ SOLN
INTRAMUSCULAR | Status: AC
  Filled 2018-01-19: qty 2

## 2018-01-19 MED ORDER — EVICEL 5 ML EX KIT: PACK | CUTANEOUS | Status: DC | PRN

## 2018-01-19 MED ORDER — APREPITANT 40 MG PO CAPS
40.0000 mg | ORAL_CAPSULE | ORAL | Status: AC
  Administered 2018-01-19: 40 mg via ORAL
  Filled 2018-01-19: qty 1

## 2018-01-19 MED ORDER — ROCURONIUM BROMIDE 10 MG/ML (PF) SYRINGE
PREFILLED_SYRINGE | INTRAVENOUS | Status: AC
  Filled 2018-01-19: qty 10

## 2018-01-19 MED ORDER — MORPHINE SULFATE (PF) 2 MG/ML IV SOLN
1.0000 mg | INTRAVENOUS | Status: DC | PRN
  Administered 2018-01-19 – 2018-01-20 (×2): 2 mg via INTRAVENOUS
  Filled 2018-01-19 (×2): qty 1

## 2018-01-19 MED ORDER — INFLUENZA VAC SPLIT QUAD 0.5 ML IM SUSY
0.5000 mL | PREFILLED_SYRINGE | INTRAMUSCULAR | Status: AC
  Administered 2018-01-20: 0.5 mL via INTRAMUSCULAR
  Filled 2018-01-19: qty 0.5

## 2018-01-19 MED ORDER — ENOXAPARIN SODIUM 30 MG/0.3ML ~~LOC~~ SOLN
30.0000 mg | Freq: Two times a day (BID) | SUBCUTANEOUS | Status: DC
  Administered 2018-01-19 – 2018-01-21 (×4): 30 mg via SUBCUTANEOUS
  Filled 2018-01-19 (×4): qty 0.3

## 2018-01-19 MED ORDER — DIPHENHYDRAMINE HCL 50 MG/ML IJ SOLN: 12.5000 mg | Freq: Three times a day (TID) | INTRAMUSCULAR | Status: DC | PRN

## 2018-01-19 MED ORDER — SUCCINYLCHOLINE 20MG/ML (10ML) SYRINGE FOR MEDFUSION PUMP - OPTIME
INTRAMUSCULAR | Status: DC | PRN
  Administered 2018-01-19: 140 mg via INTRAVENOUS

## 2018-01-19 MED ORDER — GABAPENTIN 100 MG PO CAPS
200.0000 mg | ORAL_CAPSULE | Freq: Two times a day (BID) | ORAL | Status: DC
  Administered 2018-01-19 – 2018-01-21 (×4): 200 mg via ORAL
  Filled 2018-01-19 (×4): qty 2

## 2018-01-19 MED ORDER — FENTANYL CITRATE (PF) 100 MCG/2ML IJ SOLN
25.0000 ug | INTRAMUSCULAR | Status: DC | PRN
  Administered 2018-01-19 (×3): 50 ug via INTRAVENOUS

## 2018-01-19 MED ORDER — PROPOFOL 10 MG/ML IV BOLUS
INTRAVENOUS | Status: AC
  Filled 2018-01-19: qty 20

## 2018-01-19 MED ORDER — FENTANYL CITRATE (PF) 100 MCG/2ML IJ SOLN
INTRAMUSCULAR | Status: DC | PRN
  Administered 2018-01-19: 50 ug via INTRAVENOUS
  Administered 2018-01-19: 100 ug via INTRAVENOUS
  Administered 2018-01-19 (×2): 50 ug via INTRAVENOUS

## 2018-01-19 MED ORDER — LIDOCAINE HCL 2 % IJ SOLN
INTRAMUSCULAR | Status: AC
  Filled 2018-01-19: qty 20

## 2018-01-19 MED ORDER — HYDROMORPHONE HCL 1 MG/ML IJ SOLN
INTRAMUSCULAR | Status: AC
  Filled 2018-01-19: qty 1

## 2018-01-19 MED ORDER — KETAMINE HCL 10 MG/ML IJ SOLN
INTRAMUSCULAR | Status: AC
  Filled 2018-01-19: qty 1

## 2018-01-19 MED ORDER — ACETAMINOPHEN 500 MG PO TABS
1000.0000 mg | ORAL_TABLET | ORAL | Status: AC
  Administered 2018-01-19: 1000 mg via ORAL
  Filled 2018-01-19: qty 2

## 2018-01-19 MED ORDER — CHLORHEXIDINE GLUCONATE 4 % EX LIQD: 60.0000 mL | Freq: Once | CUTANEOUS | Status: DC

## 2018-01-19 MED ORDER — DEXAMETHASONE SODIUM PHOSPHATE 10 MG/ML IJ SOLN
INTRAMUSCULAR | Status: AC
  Filled 2018-01-19: qty 1

## 2018-01-19 MED ORDER — SODIUM CHLORIDE 0.9 % IJ SOLN
INTRAMUSCULAR | Status: AC
  Filled 2018-01-19: qty 50

## 2018-01-19 MED ORDER — OXYCODONE HCL 5 MG/5ML PO SOLN
5.0000 mg | ORAL | Status: DC | PRN
  Administered 2018-01-19 – 2018-01-21 (×5): 5 mg via ORAL
  Filled 2018-01-19 (×6): qty 5

## 2018-01-19 MED ORDER — SODIUM CHLORIDE 0.9 % IV SOLN
2.0000 g | INTRAVENOUS | Status: AC
  Administered 2018-01-19: 2 g via INTRAVENOUS
  Filled 2018-01-19: qty 2

## 2018-01-19 MED ORDER — SUGAMMADEX SODIUM 500 MG/5ML IV SOLN
INTRAVENOUS | Status: DC | PRN
  Administered 2018-01-19: 400 mg via INTRAVENOUS

## 2018-01-19 MED ORDER — LACTATED RINGERS IV SOLN
INTRAVENOUS | Status: DC
  Administered 2018-01-19: 07:00:00 via INTRAVENOUS

## 2018-01-19 MED ORDER — FAMOTIDINE IN NACL 20-0.9 MG/50ML-% IV SOLN
20.0000 mg | Freq: Two times a day (BID) | INTRAVENOUS | Status: DC
  Administered 2018-01-19 – 2018-01-20 (×4): 20 mg via INTRAVENOUS
  Filled 2018-01-19 (×4): qty 50

## 2018-01-19 MED ORDER — DEXAMETHASONE SODIUM PHOSPHATE 10 MG/ML IJ SOLN
INTRAMUSCULAR | Status: DC | PRN
  Administered 2018-01-19: 10 mg via INTRAVENOUS

## 2018-01-19 MED ORDER — EVICEL 5 ML EX KIT
PACK | Freq: Once | CUTANEOUS | Status: AC
  Administered 2018-01-19: 5 mL
  Filled 2018-01-19: qty 1

## 2018-01-19 MED ORDER — ONDANSETRON HCL 4 MG/2ML IJ SOLN: 4.0000 mg | Freq: Once | INTRAMUSCULAR | Status: DC | PRN

## 2018-01-19 MED ORDER — ACETAMINOPHEN 160 MG/5ML PO SOLN
650.0000 mg | Freq: Four times a day (QID) | ORAL | Status: DC
  Administered 2018-01-19 – 2018-01-21 (×6): 650 mg via ORAL
  Filled 2018-01-19 (×7): qty 20.3

## 2018-01-19 MED ORDER — SIMETHICONE 80 MG PO CHEW
80.0000 mg | CHEWABLE_TABLET | Freq: Four times a day (QID) | ORAL | Status: DC | PRN
  Filled 2018-01-19: qty 1

## 2018-01-19 MED ORDER — BUPIVACAINE LIPOSOME 1.3 % IJ SUSP
INTRAMUSCULAR | Status: DC | PRN
  Administered 2018-01-19: 20 mL

## 2018-01-19 MED ORDER — DEXAMETHASONE SODIUM PHOSPHATE 4 MG/ML IJ SOLN: 4.0000 mg | INTRAMUSCULAR | Status: DC

## 2018-01-19 MED ORDER — SUGAMMADEX SODIUM 500 MG/5ML IV SOLN
INTRAVENOUS | Status: AC
  Filled 2018-01-19: qty 5

## 2018-01-19 MED ORDER — ENALAPRILAT 1.25 MG/ML IV SOLN
1.2500 mg | Freq: Four times a day (QID) | INTRAVENOUS | Status: DC | PRN
  Filled 2018-01-19: qty 1

## 2018-01-19 MED ORDER — HYDROMORPHONE HCL 1 MG/ML IJ SOLN
0.2500 mg | INTRAMUSCULAR | Status: DC | PRN
  Administered 2018-01-19 (×4): 0.5 mg via INTRAVENOUS

## 2018-01-19 MED ORDER — LIDOCAINE 2% (20 MG/ML) 5 ML SYRINGE
INTRAMUSCULAR | Status: AC
  Filled 2018-01-19: qty 5

## 2018-01-19 MED ORDER — PROMETHAZINE HCL 25 MG/ML IJ SOLN
12.5000 mg | Freq: Four times a day (QID) | INTRAMUSCULAR | Status: DC | PRN
  Administered 2018-01-20: 12.5 mg via INTRAVENOUS
  Filled 2018-01-19: qty 1

## 2018-01-19 MED ORDER — KETAMINE HCL 10 MG/ML IJ SOLN
INTRAMUSCULAR | Status: DC | PRN
  Administered 2018-01-19: 60 mg via INTRAVENOUS

## 2018-01-19 MED ORDER — ROCURONIUM BROMIDE 10 MG/ML (PF) SYRINGE
PREFILLED_SYRINGE | INTRAVENOUS | Status: DC | PRN
  Administered 2018-01-19: 10 mg via INTRAVENOUS
  Administered 2018-01-19: 20 mg via INTRAVENOUS
  Administered 2018-01-19: 50 mg via INTRAVENOUS
  Administered 2018-01-19: 20 mg via INTRAVENOUS

## 2018-01-19 MED ORDER — LIDOCAINE HCL (CARDIAC) PF 100 MG/5ML IV SOSY
PREFILLED_SYRINGE | INTRAVENOUS | Status: DC | PRN
  Administered 2018-01-19: 80 mg via INTRAVENOUS

## 2018-01-19 MED ORDER — LACTATED RINGERS IR SOLN
Status: DC | PRN
  Administered 2018-01-19: 1000 mL

## 2018-01-19 SURGICAL SUPPLY — 70 items
ADH SKN CLS APL DERMABOND .7 (GAUZE/BANDAGES/DRESSINGS) ×1
APL SKNCLS STERI-STRIP NONHPOA (GAUZE/BANDAGES/DRESSINGS) ×1
APL SWBSTK 6 STRL LF DISP (MISCELLANEOUS)
APPLICATOR COTTON TIP 6 STRL (MISCELLANEOUS) IMPLANT
APPLICATOR COTTON TIP 6IN STRL (MISCELLANEOUS)
APPLIER CLIP ROT 13.4 12 LRG (CLIP) ×2
APR CLP LRG 13.4X12 ROT 20 MLT (CLIP) ×1
BANDAGE ADH SHEER 1  50/CT (GAUZE/BANDAGES/DRESSINGS) ×12 IMPLANT
BENZOIN TINCTURE PRP APPL 2/3 (GAUZE/BANDAGES/DRESSINGS) ×2 IMPLANT
BLADE SURG SZ11 CARB STEEL (BLADE) ×2 IMPLANT
CABLE HIGH FREQUENCY MONO STRZ (ELECTRODE) IMPLANT
CHLORAPREP W/TINT 26ML (MISCELLANEOUS) ×4 IMPLANT
CLIP APPLIE ROT 13.4 12 LRG (CLIP) ×1 IMPLANT
CLIP SUT LAPRA TY ABSORB (SUTURE) ×4 IMPLANT
CUTTER FLEX LINEAR 45M (STAPLE) IMPLANT
DERMABOND ADVANCED (GAUZE/BANDAGES/DRESSINGS) ×1
DERMABOND ADVANCED .7 DNX12 (GAUZE/BANDAGES/DRESSINGS) ×1 IMPLANT
DEVICE SUT QUICK LOAD TK 5 (STAPLE) IMPLANT
DEVICE SUT TI-KNOT TK 5X26 (MISCELLANEOUS) IMPLANT
DEVICE SUTURE ENDOST 10MM (ENDOMECHANICALS) ×2 IMPLANT
DRAIN PENROSE 18X1/4 LTX STRL (WOUND CARE) ×2 IMPLANT
ELECT REM PT RETURN 15FT ADLT (MISCELLANEOUS) ×2 IMPLANT
GAUZE 4X4 16PLY RFD (DISPOSABLE) ×2 IMPLANT
GAUZE SPONGE 4X4 12PLY STRL (GAUZE/BANDAGES/DRESSINGS) IMPLANT
GLOVE BIO SURGEON STRL SZ7.5 (GLOVE) IMPLANT
GLOVE INDICATOR 8.0 STRL GRN (GLOVE) ×2 IMPLANT
GOWN STRL REUS W/TWL XL LVL3 (GOWN DISPOSABLE) ×8 IMPLANT
HOVERMATT SINGLE USE (MISCELLANEOUS) ×2 IMPLANT
KIT BASIN OR (CUSTOM PROCEDURE TRAY) ×2 IMPLANT
KIT GASTRIC LAVAGE 34FR ADT (SET/KITS/TRAYS/PACK) ×2 IMPLANT
LUBRICANT JELLY K Y 4OZ (MISCELLANEOUS) ×2 IMPLANT
MARKER SKIN DUAL TIP RULER LAB (MISCELLANEOUS) ×2 IMPLANT
NEEDLE SPNL 22GX3.5 QUINCKE BK (NEEDLE) ×2 IMPLANT
PACK CARDIOVASCULAR III (CUSTOM PROCEDURE TRAY) ×2 IMPLANT
RELOAD 45 VASCULAR/THIN (ENDOMECHANICALS) IMPLANT
RELOAD ENDO STITCH 2.0 (ENDOMECHANICALS) ×16
RELOAD STAPLE TA45 3.5 REG BLU (ENDOMECHANICALS) IMPLANT
RELOAD STAPLER BLUE 60MM (STAPLE) ×5 IMPLANT
RELOAD STAPLER GOLD 60MM (STAPLE) ×1 IMPLANT
RELOAD STAPLER WHITE 60MM (STAPLE) ×2 IMPLANT
SCISSORS LAP 5X45 EPIX DISP (ENDOMECHANICALS) ×2 IMPLANT
SET IRRIG TUBING LAPAROSCOPIC (IRRIGATION / IRRIGATOR) ×2 IMPLANT
SHEARS HARMONIC ACE PLUS 45CM (MISCELLANEOUS) ×2 IMPLANT
SLEEVE XCEL OPT CAN 5 100 (ENDOMECHANICALS) ×4 IMPLANT
SOLUTION ANTI FOG 6CC (MISCELLANEOUS) ×2 IMPLANT
STAPLER ECHELON BIOABSB 60 FLE (MISCELLANEOUS) IMPLANT
STAPLER ECHELON LONG 60 440 (INSTRUMENTS) ×2 IMPLANT
STAPLER RELOAD BLUE 60MM (STAPLE) ×10
STAPLER RELOAD GOLD 60MM (STAPLE) ×2
STAPLER RELOAD WHITE 60MM (STAPLE) ×4
STRIP CLOSURE SKIN 1/2X4 (GAUZE/BANDAGES/DRESSINGS) ×2 IMPLANT
SUT MNCRL AB 4-0 PS2 18 (SUTURE) ×2 IMPLANT
SUT RELOAD ENDO STITCH 2 48X1 (ENDOMECHANICALS) ×4
SUT RELOAD ENDO STITCH 2.0 (ENDOMECHANICALS) ×4
SUT SURGIDAC NAB ES-9 0 48 120 (SUTURE) IMPLANT
SUT VIC AB 2-0 SH 27 (SUTURE) ×2
SUT VIC AB 2-0 SH 27X BRD (SUTURE) ×1 IMPLANT
SUTURE RELOAD END STTCH 2 48X1 (ENDOMECHANICALS) ×4 IMPLANT
SUTURE RELOAD ENDO STITCH 2.0 (ENDOMECHANICALS) ×4 IMPLANT
SYR 10ML ECCENTRIC (SYRINGE) ×2 IMPLANT
SYR 20CC LL (SYRINGE) ×4 IMPLANT
TIP RIGID 35CM EVICEL (HEMOSTASIS) ×2 IMPLANT
TOWEL OR 17X26 10 PK STRL BLUE (TOWEL DISPOSABLE) ×2 IMPLANT
TOWEL OR NON WOVEN STRL DISP B (DISPOSABLE) ×2 IMPLANT
TRAY FOLEY MTR SLVR 16FR STAT (SET/KITS/TRAYS/PACK) ×2 IMPLANT
TROCAR BLADELESS OPT 5 100 (ENDOMECHANICALS) ×2 IMPLANT
TROCAR UNIVERSAL OPT 12M 100M (ENDOMECHANICALS) ×6 IMPLANT
TROCAR XCEL 12X100 BLDLESS (ENDOMECHANICALS) ×2 IMPLANT
TUBING CONNECTING 10 (TUBING) ×4 IMPLANT
TUBING INSUF HEATED (TUBING) ×2 IMPLANT

## 2018-01-19 NOTE — Interval H&P Note (Signed)
History and Physical Interval Note:  01/19/2018 7:13 AM  Dawn Hayes  has presented today for surgery, with the diagnosis of Morbid Obesity, HTN, GERD  The various methods of treatment have been discussed with the patient and family. After consideration of risks, benefits and other options for treatment, the patient has consented to  Procedure(s): LAPAROSCOPIC ROUX-EN-Y GASTRIC BYPASS WITH UPPER ENDOSCOPY, ERAS Pathway (N/A) as a surgical intervention .  The patient's history has been reviewed, patient examined, no change in status, stable for surgery.  I have reviewed the patient's chart and labs.  Questions were answered to the patient's satisfaction.     Greer Pickerel

## 2018-01-19 NOTE — Progress Notes (Addendum)
Patient has walked to the chair, and then to the rocking chair. She has attempted her incentive spirometer and reached 1250. Will continue to monitor progress.   Patient started water at 1630

## 2018-01-19 NOTE — Op Note (Signed)
Preoperative diagnosis: Roux-en-Y gastric bypass  Postoperative diagnosis: Same   Procedure: Upper endoscopy   Surgeon: Luke Kinsinger, M.D.  Anesthesia: Gen.   Indications for procedure: This patient was undergoing a Roux-en-Y gastric bypass.   Description of procedure: The endoscopy was placed in the mouth and into the oropharynx and under endoscopic vision it was advanced to the esophagogastric junction. The pouch was insufflated and no bleeding or bubbles were seen. The GEJ was identified at 39 cm from the teeth. The anastomosis was seen at 45 cm. No bleeding or leaks were detected. The scope was withdrawn without difficulty.   Luke Kinsinger, M.D. General, Bariatric, & Minimally Invasive Surgery [facility]    

## 2018-01-19 NOTE — H&P (Signed)
Dawn Hayes Documented: 12/30/2017 11:29 AM Location: [facility] Patient #: [id number] DOB: 10/24/1970 Married / Language: Dawn Hayes / Race: White Female   History of Present Illness Dawn Hiss M. Rahm Minix MD; 12/30/2017 12:29 PM) The patient is a 47 year old female who presents for a bariatric surgery evaluation. She comes in for preoperative evaluation. She has completed her supervised weight loss. She has also been cleared by psychology. She has also stopped smoking. Her urine nicotine test was negative. She is accompanied by her husband. She states since her last visit she has been diagnosed with fibromyalgia in addition to her psoarthrish arthritis. She is no longer on prednisone. We have switched her to biologic therapy - an injection every 3 months. She believes it is helped. She is also on Norco for back pain. Otherwise she denies any medical changes. She has gotten in IV iron infusions since her last visit. She denies any chest tightness, angina, TIAs or amaurosis fugax. Her upper GI was normal. Her bariatric evaluation labs were unremarkable except for iron deficiency anemia.  october 2018 --[facility] is referred by Dr Cyndy Freeze for evaluation of weight loss surgery. She completed our Neurosurgeon. She is interested in weight loss surgery in order to improve her overall health, decrease the number medication she has to take, and improve her chronic back pain. Despite numerous attempts for sustained weight loss she has been unsuccessful. She is tried [employer], [facility], dietitian supervised programs-all without any long-term success.  Her comorbidities include hypertension, gastroesophageal reflux, chronic back pain, costochondritis.  She denies any chest pain, chest pressure, shortness of breath, angina, paroxysmal nocturnal dyspnea, orthopnea. She does have some dyspnea on exertion. She does have some ankle edema. She denies any personal family history of  blood clots. She takes Nexium daily. She has had a laparoscopic cholecystectomy. She has a daily bowel movement. She has had both an upper and lower endoscopic. She has had a total abdominal hysterectomy.She denies any melena, hematochezia, dysuria or hematuria.  She has chronic back pain. She has seen a neurosurgeon. She has T11-12 disc herniation as well as epidural lipomatosis. It is felt that if she was able to lose weight this would help with her chronic back pain. She also has severe costochondritis requiring several ER trips a year. She is also been seeing a rheumatologist for this. She was most recently started cosyntix as well as she has to take daily prednisone to try to decrease her flares. She will have several flares of her costochondritis per year. She only takes hydrocodone during her flares. She denies any TIAs or amaurosis fugax. She denies any lightheadedness or dizziness. She does smoke. A pack last 2-3 days. She also has 1 or 2 glasses of wine per day.  ROS - otherwise a 12 point ROS was negative except what is mentioned in HPI   Problem List/Past Medical Dawn Hiss M. Redmond Pulling, MD; 12/30/2017 12:29 PM) PRE-OP EVALUATION (Z01.818)  HIATAL HERNIA (K44.9)  MORBID OBESITY WITH BMI OF 50.0-59.9, ADULT (E66.01)  PSORIATIC ARTHRITIS (L40.50)  IRON DEFICIENCY ANEMIA, UNSPECIFIED IRON DEFICIENCY ANEMIA TYPE (D50.9)   Past Surgical History Dawn Hiss M. Redmond Pulling, MD; 12/30/2017 12:29 PM) Gallbladder Surgery - Laparoscopic  Hemorrhoidectomy  Hysterectomy (not due to cancer) - Complete   Diagnostic Studies History Dawn Hiss M. Redmond Pulling, MD; 12/30/2017 12:29 PM) Colonoscopy  within last year Mammogram  within last year Pap Smear  1-5 years ago  Allergies Dawn Hiss M. Redmond Pulling, MD; 12/30/2017 12:29 PM) Aspirin *ANALGESICS - NonNarcotic*  Medication History Dawn Hiss M. Redmond Pulling, MD; 12/30/2017 12:29 PM) Lyrica (75MG  Capsule, Oral) Active. Furosemide (20MG  Tablet, Oral)  Active. Celecoxib (200MG  Capsule, Oral) Active. Medications Reconciled Hydrocodone-Acetaminophen (5-325MG  Tablet, Oral) Active. HydroCHLOROthiazide (25MG  Tablet, Oral) Active. Lisinopril (10MG  Tablet, Oral) Active. NexIUM (40MG  Capsule DR, Oral) Active. Cosentyx Sensoready 300 Dose (150MG /ML Soln Auto-inj, Subcutaneous) Active. Multiple Vitamin (Oral) Active.  Social History Dawn Hiss M. Redmond Pulling, MD; 12/30/2017 12:29 PM) Alcohol use  Moderate alcohol use. Caffeine use  Tea. No drug use  Tobacco use  Current every day smoker.  Family History Dawn Hiss M. Redmond Pulling, MD; 12/30/2017 12:29 PM) Cancer  Father. Respiratory Condition  Father.  Pregnancy / Birth History Dawn Hiss M. Redmond Pulling, MD; 12/30/2017 12:29 PM) Age at menarche  58 years. Gravida  0 Para  0  Other Problems Dawn Hiss M. Redmond Pulling, MD; 12/30/2017 12:29 PM) Diverticulosis  Hemorrhoids  Other disease, cancer, significant illness  COSTOCHONDRITIS (M94.0)  GASTROESOPHAGEAL REFLUX DISEASE, ESOPHAGITIS PRESENCE NOT SPECIFIED (K21.9)  CHRONIC MIDLINE THORACIC BACK PAIN (M54.6)  ESSENTIAL HYPERTENSION (I10)     Review of Systems Dawn Hiss M. [name]; 12/30/2017 12:26 PM) General Present- Fatigue. Not Present- Appetite Loss, Chills, Fever, Night Sweats, Weight Gain and Weight Loss. HEENT Present- Wears glasses/contact lenses. Not Present- Earache, Hearing Loss, Hoarseness, Nose Bleed, Oral Ulcers, Ringing in the Ears, Seasonal Allergies, Sinus Pain, Sore Throat, Visual Disturbances and Yellow Eyes. Gastrointestinal Present- Hemorrhoids. Not Present- Abdominal Pain, Bloating, Bloody Stool, Change in Bowel Habits, Chronic diarrhea, Constipation, Difficulty Swallowing, Excessive gas, Gets full quickly at meals, Indigestion, Nausea, Rectal Pain and Vomiting. Musculoskeletal Present- Back Pain. Not Present- Joint Pain, Joint Stiffness, Muscle Pain, Muscle Weakness and Swelling of Extremities. All other systems negative  Vitals (Dawn  Hayes CMA; 12/30/2017 11:30 AM) 12/30/2017 11:29 AM Weight: 264.8 lb Height: 61.5in Body Surface Area: 2.14 m Body Mass Index: 49.22 kg/m  Pulse: 112 (Regular)  BP: 132/82 (Sitting, Left Arm, Standard)       Physical Exam Dawn Hiss M. Zulema Pulaski MD; 12/30/2017 12:26 PM) General Mental Status-Alert. General Appearance-Consistent with stated age. Hydration-Well hydrated. Voice-Normal. Note: morbidly obese, central truncal   Head and Neck Head-normocephalic, atraumatic with no lesions or palpable masses. Trachea-midline. Thyroid Gland Characteristics - normal size and consistency.  Eye Eyeball - Bilateral-Extraocular movements intact. Sclera/Conjunctiva - Bilateral-No scleral icterus.  Chest and Lung Exam Chest and lung exam reveals -quiet, even and easy respiratory effort with no use of accessory muscles and on auscultation, normal breath sounds, no adventitious sounds and normal vocal resonance. Inspection Chest Wall - Normal. Back - normal.  Breast - Did not examine.  Cardiovascular Cardiovascular examination reveals -normal heart sounds, regular rate and rhythm with no murmurs and normal pedal pulses bilaterally.  Abdomen Inspection Inspection of the abdomen reveals - No Hernias. Skin - Scar - no surgical scars. Palpation/Percussion Palpation and Percussion of the abdomen reveal - Soft, Non Tender, No Rebound tenderness, No Rigidity (guarding) and No hepatosplenomegaly. Auscultation Auscultation of the abdomen reveals - Bowel sounds normal.  Peripheral Vascular Upper Extremity Palpation - Pulses bilaterally normal.  Neurologic Neurologic evaluation reveals -alert and oriented x 3 with no impairment of recent or remote memory. Mental Status-Normal.  Neuropsychiatric The patient's mood and affect are described as -normal. Judgment and Insight-insight is appropriate concerning matters relevant to self.  Musculoskeletal Normal  Exam - Left-Upper Extremity Strength Normal and Lower Extremity Strength Normal. Normal Exam - Right-Upper Extremity Strength Normal and Lower Extremity Strength Normal. Note: bilateral knee crepitus   Lymphatic Head & Neck  General  Head & Neck Lymphatics: Bilateral - Description - Normal. Axillary - Did not examine. Femoral & Inguinal - Did not examine.    Assessment & Plan Dawn Hiss M. Yaniv Lage MD; 12/30/2017 12:26 PM) MORBID OBESITY WITH BMI OF 50.0-59.9, ADULT (E66.01) Impression: The patient meets weight loss surgery criteria. I think the patient would be an acceptable candidate for Laparoscopic Roux-en-Y Gastric bypass.  We briefly rediscussed laparoscopic Roux-en-Y gastric bypass.  We reviewed her workup and labs. we rediscussed the typical postop course. all of her questions asked/answered  she has stopped smoking and has negative urine screen. congratulated her on stopping smoking. Current Plans Pt Education - EMW_preopbariatric CHRONIC MIDLINE THORACIC BACK PAIN (M54.6) COSTOCHONDRITIS (M94.0) GASTROESOPHAGEAL REFLUX DISEASE, ESOPHAGITIS PRESENCE NOT SPECIFIED (K21.9) Impression: With respect to GERD, we discussed how gerd could worsen in some patients after sleeve gastrectomy or potentially adjustable gastric band ESSENTIAL HYPERTENSION (I10) PSORIATIC ARTHRITIS (L40.50) IRON DEFICIENCY ANEMIA, UNSPECIFIED IRON DEFICIENCY ANEMIA TYPE (D50.9) Impression: We discussed her iron deficiency anemia. We discussed that she will likely need ongoing iron supplementation postoperatively. She is had a negative colonoscopy. She had a negative upper endoscopy. She had a capsule study that showed a few proximal small bowel superficial erosions as well as benign-appearing polyps in the stomach. We discussed that after Roux-en-Y gastric bypass surveilling the gastric remnant and proximal small bowel would be very challenging; however, I do not think it is risk prohibitive to proceed with  Roux-en-Y gastric bypass especially given her history of GERD HIATAL HERNIA (K44.9) Impression: Her upper GI showed no hiatal hernia but a upper endoscopy was concerning for to submit hiatal hernia. I advised that we would test for one intraoperatively. If found to have a clinically significant hiatal hernia, we would repair it.   Leighton Ruff. Redmond Pulling, MD, FACS General, Bariatric, & Minimally Invasive Surgery [facility] [place]

## 2018-01-19 NOTE — Discharge Instructions (Signed)
° ° ° °GASTRIC BYPASS/SLEEVE ° Home Care Instructions ° ° These instructions are to help you care for yourself when you go home. ° °Call: If you have any problems. °• Call [phone] and ask for the surgeon on call °• If you need immediate help, come to the ER at [facility].  °• Tell the ER staff that you are a new post-op gastric bypass or gastric sleeve patient °  °Signs and symptoms to report: • Severe vomiting or nausea °o If you cannot keep down clear liquids for longer than 1 day, call your surgeon  °• Abdominal pain that does not get better after taking your pain medication °• Fever over 100.4° F with chills °• Heart beating over 100 beats a minute °• Shortness of breath at rest °• Chest pain °•  Redness, swelling, drainage, or foul odor at incision (surgical) sites °•  If your incisions open or pull apart °• Swelling or pain in calf (lower leg) °• Diarrhea (Loose bowel movements that happen often), frequent watery, uncontrolled bowel movements °• Constipation, (no bowel movements for 3 days) if this happens: Pick one °o Milk of Magnesia, 2 tablespoons by mouth, 3 times a day for 2 days if needed °o Stop taking Milk of Magnesia once you have a bowel movement °o Call your doctor if constipation continues °Or °o Miralax  (instead of Milk of Magnesia) following the label instructions °o Stop taking Miralax once you have a bowel movement °o Call your doctor if constipation continues °• Anything you think is not normal °  °Normal side effects after surgery: • Unable to sleep at night or unable to focus °• Irritability or moody °• Being tearful (crying) or depressed °These are common complaints, possibly related to your anesthesia medications that put you to sleep, stress of surgery, and change in lifestyle.  This usually goes away a few weeks after surgery.  If these feelings continue, call your primary care doctor. °  °Wound Care: You may have surgical glue, steri-strips, or staples over your incisions after  surgery °• Surgical glue:  Looks like a clear film over your incisions and will wear off a little at a time °• Steri-strips: Strips of tape over your incisions. You may notice a yellowish color on the skin under the steri-strips. This is used to make the   steri-strips stick better. Do not pull the steri-strips off - let them fall off °• Staples: Staples may be removed before you leave the hospital °o If you go home with staples, call [facility], [phone] at for an appointment with your surgeon’s nurse to have staples removed 10 days after surgery. °• Showering: You may shower two (2) days after your surgery unless your surgeon tells you differently °o Wash gently around incisions with warm soapy water, rinse well, and gently pat dry  °o No tub baths until staples are removed, steri-strips fall off or glue is gone.  °  °Medications: • Medications should be liquid or crushed if larger than the size of a dime °• Extended release pills (medication that release a little bit at a time through the day) should NOT be crushed or cut. (examples include XL, ER, DR, SR) °• Depending on the size and number of medications you take, you may need to space (take a few throughout the day)/change the time you take your medications so that you do not over-fill your pouch (smaller stomach) °• Make sure you follow-up with your primary care doctor to   make medication changes needed during rapid weight loss and life-style changes °• If you have diabetes, follow up with the doctor that orders your diabetes medication(s) within one week after surgery and check your blood sugar regularly. °• Do not drive while taking prescription pain medication  °• It is ok to take Tylenol by the bottle instructions with your pain medicine or instead of your pain medicine as needed.  DO NOT TAKE NSAIDS (EXAMPLES OF NSAIDS:  IBUPROFREN/ NAPROXEN)  °Diet:                    First 2 Weeks ° You will see the dietician t about two (2) weeks  after your surgery. The dietician will increase the types of foods you can eat if you are handling liquids well: °• If you have severe vomiting or nausea and cannot keep down clear liquids lasting longer than 1 day, call your surgeon @ ([phone]) °Protein Shake °• Drink at least 2 ounces of shake 5-6 times per day °• Each serving of protein shakes (usually 8 - 12 ounces) should have: °o 15 grams of protein  °o And no more than 5 grams of carbohydrate  °• Goal for protein each day: °o Men = 80 grams per day °o Women = 60 grams per day °• Protein powder may be added to fluids such as non-fat milk or Lactaid milk or unsweetened Soy/Almond milk (limit to 35 grams added protein powder per serving) ° °Hydration °• Slowly increase the amount of water and other clear liquids as tolerated (See Acceptable Fluids) °• Slowly increase the amount of protein shake as tolerated  °•  Sip fluids slowly and throughout the day.  Do not use straws. °• May use sugar substitutes in small amounts (no more than 6 - 8 packets per day; i.e. Splenda) ° °Fluid Goal °• The first goal is to drink at least 8 ounces of protein shake/drink per day (or as directed by the nutritionist); some examples of protein shakes are Syntrax Nectar, Adkins Advantage, EAS Edge HP, and Unjury. See handout from pre-op Bariatric Education Class: °o Slowly increase the amount of protein shake you drink as tolerated °o You may find it easier to slowly sip shakes throughout the day °o It is important to get your proteins in first °• Your fluid goal is to drink 64 - 100 ounces of fluid daily °o It may take a few weeks to build up to this °• 32 oz (or more) should be clear liquids  °And  °• 32 oz (or more) should be full liquids (see below for examples) °• Liquids should not contain sugar, caffeine, or carbonation ° °Clear Liquids: °• Water or Sugar-free flavored water (i.e. Fruit H2O, Propel) °• Decaffeinated coffee or tea (sugar-free) °• Crystal Lite, Wyler’s Lite,  Minute Maid Lite °• Sugar-free Jell-O °• Bouillon or broth °• Sugar-free Popsicle:   *Less than 20 calories each; Limit 1 per day ° °Full Liquids: °Protein Shakes/Drinks + 2 choices per day of other full liquids °• Full liquids must be: °o No More Than 15 grams of Carbs per serving  °o No More Than 3 grams of Fat per serving °• Strained low-fat cream soup (except Cream of Potato or Tomato) °• Non-Fat milk °• Fat-free Lactaid Milk °• Unsweetened Soy Or Unsweetened Almond Milk °• Low Sugar yogurt (Dannon Lite & Fit, Greek yogurt; Oikos Triple Zero; Chobani Simply 100; Yoplait 100 calorie Greek - No Fruit on the Bottom) ° °  °Vitamins   and Minerals • Start 1 day after surgery unless otherwise directed by your surgeon °• 2 Chewable Bariatric Specific Multivitamin / Multimineral Supplement with iron (Example: Bariatric Advantage Multi EA) °• Chewable Calcium with Vitamin D-3 °(Example: 3 Chewable Calcium Plus 600 with Vitamin D-3) °o Take 500 mg three (3) times a day for a total of 1500 mg each day °o Do not take all 3 doses of calcium at one time as it may cause constipation, and you can only absorb 500 mg  at a time  °o Do not mix multivitamins containing iron with calcium supplements; take 2 hours apart °• Menstruating women and those with a history of anemia (a blood disease that causes weakness) may need extra iron °o Talk with your doctor to see if you need more iron °• Do not stop taking or change any vitamins or minerals until you talk to your dietitian or surgeon °• Your Dietitian and/or surgeon must approve all vitamin and mineral supplements °  °Activity and Exercise: Limit your physical activity as instructed by your doctor.  It is important to continue walking at home.  During this time, use these guidelines: °• Do not lift anything greater than ten (10) pounds for at least two (2) weeks °• Do not go back to work or drive until your surgeon says you can °• You may have sex when you feel comfortable  °o It is  VERY important for female patients to use a reliable birth control method; fertility often increases after surgery  °o All hormonal birth control will be ineffective for 30 days after surgery due to medications given during surgery a barrier method must be used. °o Do not get pregnant for at least 18 months °• Start exercising as soon as your doctor tells you that you can °o Make sure your doctor approves any physical activity °• Start with a simple walking program °• Walk 5-15 minutes each day, 7 days per week.  °• Slowly increase until you are walking 30-45 minutes per day °Consider joining our BELT program. [phone] or email [email or web address] °  °Special Instructions Things to remember: °• Use your CPAP when sleeping if this applies to you ° °• [facility] has two free Bariatric Surgery Support Groups that meet monthly °o The 3rd Thursday of each month, 6 pm, [facility]  °o The 2nd Friday of each month, 11:45 am in the private dining room in the basement of [facility] °• It is very important to keep all follow up appointments with your surgeon, dietitian, primary care physician, and behavioral health practitioner °• Routine follow up schedule with your surgeon include appointments at 2-3 weeks, 6-8 weeks, 6 months, and 1 year at a minimum.  Your surgeon may request to see you more often.   °o After the first year, please follow up with your bariatric surgeon and dietitian at least once a year in order to maintain best weight loss results °[facility]: [phone] °[facility]: [phone] °Bariatric Nurse Coordinator: [phone] °  °   Reviewed and Endorsed  °by [facility] Patient Education Committee, June, 2016 °Edits Approved: Aug, 2018 ° ° ° °

## 2018-01-19 NOTE — Anesthesia Postprocedure Evaluation (Signed)
Anesthesia Post Note  Patient: Dawn Hayes  Procedure(s) Performed: LAPAROSCOPIC ROUX-EN-Y GASTRIC BYPASS WITH HIATAL HERNIA REPAIR, WITH UPPER ENDOSCOPY, ERAS Pathway (N/A Abdomen) LAPAROSCOPIC LYSIS OF ADHESIONS (N/A Abdomen)     Patient location during evaluation: PACU Anesthesia Type: General Level of consciousness: awake and alert Pain management: pain level controlled Vital Signs Assessment: post-procedure vital signs reviewed and stable Respiratory status: spontaneous breathing, nonlabored ventilation, respiratory function stable and patient connected to nasal cannula oxygen Cardiovascular status: blood pressure returned to baseline and stable Postop Assessment: no apparent nausea or vomiting Anesthetic complications: no    Last Vitals:  Vitals:   01/19/18 1415 01/19/18 1431  BP: (!) 150/90 (!) 153/79  Pulse:  81  Resp:  16  Temp: 36.6 C (!) 36.3 C  SpO2:  98%    Last Pain:  Vitals:   01/19/18 1431  TempSrc: Oral  PainSc:                  [name]

## 2018-01-19 NOTE — Anesthesia Procedure Notes (Signed)
Procedure Name: Intubation Date/Time: 01/19/2018 7:40 AM Performed by: Glory Buff, CRNA Pre-anesthesia Checklist: Patient identified, Emergency Drugs available, Suction available and Patient being monitored Patient Re-evaluated:Patient Re-evaluated prior to induction Oxygen Delivery Method: Circle system utilized Preoxygenation: Pre-oxygenation with 100% oxygen Induction Type: IV induction Ventilation: Mask ventilation without difficulty Laryngoscope Size: Glidescope Grade View: Grade I Tube type: Oral Tube size: 7.0 mm Number of attempts: 1 Airway Equipment and Method: Stylet and Oral airway Placement Confirmation: ETT inserted through vocal cords under direct vision,  positive ETCO2 and breath sounds checked- equal and bilateral Secured at: 21 cm Tube secured with: Tape Dental Injury: Teeth and Oropharynx as per pre-operative assessment  Difficulty Due To: Difficult Airway- due to limited oral opening and Difficult Airway- due to anterior larynx Comments: DL with Sabra Heck 3 with grade III view, opted to use glidescope and achieved a grade I view.

## 2018-01-19 NOTE — Transfer of Care (Signed)
Immediate Anesthesia Transfer of Care Note  Patient: Dawn Hayes  Procedure(s) Performed: LAPAROSCOPIC ROUX-EN-Y GASTRIC BYPASS WITH HIATAL HERNIA REPAIR, WITH UPPER ENDOSCOPY, ERAS Pathway (N/A Abdomen) LAPAROSCOPIC LYSIS OF ADHESIONS (N/A Abdomen)  Patient Location: PACU  Anesthesia Type:General  Level of Consciousness: awake, alert , oriented and patient cooperative  Airway & Oxygen Therapy: Patient Spontanous Breathing and Patient connected to face mask oxygen  Post-op Assessment: Report given to RN, Post -op Vital signs reviewed and stable and Patient moving all extremities  Post vital signs: Reviewed and stable  Last Vitals:  Vitals Value Taken Time  BP 138/85 01/19/2018 10:45 AM  Temp    Pulse 88 01/19/2018 10:46 AM  Resp 16 01/19/2018 10:46 AM  SpO2 100 % 01/19/2018 10:46 AM  Vitals shown include unvalidated device data.  Last Pain:  Vitals:   01/19/18 0622  TempSrc: Oral  PainSc:       Patients Stated Pain Goal: 4 (68/37/29 0211)  Complications: No apparent anesthesia complications

## 2018-01-19 NOTE — Progress Notes (Signed)
Discussed post op day goals with patient including ambulation, IS, diet progression, pain, and nausea control.  Questions answered. 

## 2018-01-19 NOTE — Progress Notes (Signed)
Learned about pt's recent prednisone usage.  Discussed this with her.  We had previously discussed prednisone and it's risk with healing previously in clinic She voiced understanding that recent prednisone usage changes her risk of leak/infection perioperatively and wishes to proceed  Dawn Hayes. Redmond Pulling, MD, FACS General, Bariatric, & Minimally Invasive Surgery [facility], [place]

## 2018-01-19 NOTE — Progress Notes (Signed)
PHARMACY CONSULT FOR:  Risk Assessment for Post-Discharge VTE Following Bariatric Surgery  Post-Discharge VTE Risk Assessment: This patient's probability of 30-day post-discharge VTE is increased due to the factors marked:   Female    Age >/=60 years    BMI >/=50 kg/m2    CHF    Dyspnea at Rest    Paraplegia  X  Non-gastric-band surgery    Operation Time >/=3 hr    Return to OR     Length of Stay >/= 3 d   Predicted probability of 30-day post-discharge VTE: 0.16%  Other patient-specific factors to consider: N/A   Recommendation for Discharge: No pharmacologic prophylaxis post-discharge   Will continue to monitor for return to operating room and LOS, which can affect probability of 30-day post-discharge VTE. Will update recommendations as needed.    Dawn Hayes is a 47 y.o. female who underwent laparoscopic Roux-en-Y gastric bypass, upper endoscopy, laparoscopic repair of small hiatal hernias, lysis of adhesions on 01/19/18.    Case start: 01/19/18 at 0805 Case end: 01/19/18 at 1040   Allergies  Allergen Reactions  . Aspirin Other (See Comments)    Convulsions   . Toradol [Ketorolac Tromethamine] Nausea And Vomiting  . Adhesive [Tape] Itching and Rash    Patient Measurements: Height: 5' 1.5" (156.2 cm) Weight: 259 lb (117.5 kg) IBW/kg (Calculated) : 48.95 Body mass index is 48.15 kg/m.  Recent Labs    01/19/18 1105  HGB 12.4  HCT 37.8   Estimated Creatinine Clearance: 104.9 mL/min (by C-G formula based on SCr of 0.74 mg/dL).    Past Medical History:  Diagnosis Date  . Anemia   . Arthritis    psoriatic arthritis  . Costochondritis   . Diverticulitis   . Dysmenorrhea   . Fibromyalgia   . GERD (gastroesophageal reflux disease)   . Headache    Migraines  . History of hiatal hernia   . Hypertension   . Menorrhagia   . Psoriatic arthritis ([place])   . Smoker      Medications Prior to Admission  Medication Sig Dispense Refill Last Dose  . celecoxib  (CELEBREX) 200 MG capsule Take 200 mg by mouth daily.  2 01/12/2018  . Cholecalciferol (VITAMIN D3 PO) Take 1 tablet by mouth daily with lunch.   01/17/2018  . diclofenac sodium (VOLTAREN) 1 % GEL Apply 2-4 g topically 4 (four) times daily as needed (for muscle/joint pain.).   0 01/05/2018  . DULoxetine (CYMBALTA) 60 MG capsule Take 1 capsule (60 mg total) by mouth daily. (Patient taking differently: Take 60 mg by mouth at bedtime. ) 30 capsule 3 01/17/2018  . esomeprazole (NEXIUM) 40 MG capsule TAKE 1 CAPSULE(40 MG) BY MOUTH DAILY BEFORE BREAKFAST 30 capsule 11 01/19/2018 at 0330  . furosemide (LASIX) 20 MG tablet Take 1 tablet (20 mg total) by mouth daily. As needed for swelling (Patient taking differently: Take 20 mg by mouth daily as needed (for swelling/retention.). ) 30 tablet 3 01/17/2018  . hydrochlorothiazide (HYDRODIURIL) 25 MG tablet Take 1 tablet (25 mg total) by mouth daily. 30 tablet 2 01/18/2018 at Unknown time  . HYDROcodone-acetaminophen (NORCO) 10-325 MG tablet Take 1 tablet by mouth 3 (three) times daily as needed (for pain).   0 01/18/2018 at Unknown time  . Menthol, Topical Analgesic, (BIOFREEZE EX) Apply 1 application topically 4 (four) times daily as needed (for muscle/joint pain.).   12/29/2017  . Multiple Vitamin (MULTIVITAMIN WITH MINERALS) TABS tablet Take 1 tablet by mouth daily.  01/17/2018  . predniSONE (DELTASONE) 10 MG tablet 3 tabs x3 days and then 2 tabs x3 days and then 1 tab x3 days.  Take w/ food. (Patient taking differently: Take 10-30 mg by mouth See admin instructions. 3 tabs x3 days and then 2 tabs x3 days and then 1 tab x3 days.  Take w/ food.) 18 tablet 0 01/17/2018  . triamcinolone cream (KENALOG) 0.1 % Apply 1 application topically daily as needed (for irritation).    12/29/2017  . XIIDRA 5 % SOLN Place 1 drop into both eyes daily as needed (dry/irritated eyes.).   12 01/05/2018  . acetaminophen-codeine (TYLENOL/CODEINE #4) 300-60 MG tablet Take 1 tablet by mouth every 4 (four)  hours as needed for pain. (Patient not taking: Reported on 01/08/2018) 20 tablet 0 Not Taking at Unknown time  . CHANTIX 1 MG tablet TAKE 1 TABLET(1 MG) BY MOUTH TWICE DAILY (Patient not taking: Reported on 12/31/2017) 60 tablet 0 Not Taking  . nystatin (MYCOSTATIN) 100000 UNIT/ML suspension SWISH AND SPIT 5 ML BY MOUTH FOUR TIMES DAILY AS NEEDED FOR THRUSH (Patient taking differently: Use as directed 5 mLs in the mouth or throat 4 (four) times daily as needed (thrush). ) 473 mL 0 More than a month at Unknown time  . ustekinumab (STELARA) 130 MG/26ML SOLN injection Inject 130 mg into the vein every 3 (three) months.    More than a month at Unknown time       Luiz Ochoa 01/19/2018,3:14 PM

## 2018-01-19 NOTE — Op Note (Signed)
Dawn Hayes [id number] [birth year]. 01/19/2018  Preoperative diagnosis:    Morbid obesity, BMI 48   GERD   HTN (hypertension)   Hiatal hernia   Fibromyalgia  Postoperative  diagnosis:   same + intra-abdominal adhesions  Surgical procedure: Laparoscopic Roux-en-Y gastric bypass (ante-colic, ante-gastric); upper endoscopy; laparoscopic repair of small hiatal hernia; lysis of adhesions x 30 mins  Surgeon: Gayland Curry, M.D. FACS  Asst.: Gurney Maxin MD   Anesthesia: General plus exparel/marcaine mix  Complications: None   EBL: Minimal   Drains: None   Disposition: PACU in good condition   Indications for procedure: 47 y.o. yo female with morbid obesity who has been unsuccessful at sustained weight loss. The patient's comorbidities are listed above. We discussed the risk and benefits of surgery including but not limited to anesthesia risk, bleeding, infection, blood clot formation, anastomotic leak, anastomotic stricture, ulcer formation, death, respiratory complications, intestinal blockage, internal hernia, gallstone formation, vitamin and nutritional deficiencies, injury to surrounding structures, failure to lose weight and mood changes.   Description of procedure: Patient is brought to the operating room and general anesthesia induced. The patient had received preoperative broad-spectrum IV antibiotics and subcutaneous heparin. The abdomen was widely sterilely prepped with Chloraprep and draped. Patient timeout was performed and correct patient and procedure confirmed. Access was obtained with a 12 mm Optiview trocar in the left upper quadrant and pneumoperitoneum established without difficulty.  There is evidence of omental adhesions to the anterior abdominal wall.  There is also some evidence of the omentum adhered to the descending colon.  At placing an additional 5 mm trocar in the left lateral abdominal wall.  I then used a harmonic scalpel to lyse the omental adhesions from  the anterior abdominal wall.  I then ended up taking down some of the omentum from the descending colon in order to lift the omentum into the upper abdomen after placing my additional trochars as described below. under direct vision 12 mm trocars were placed laterally in the right upper quadrant, right upper quadrant midclavicular line, and to the left and above the umbilicus for the camera port.  Exparel/marcaine mix was infiltrated in bilateral lateral abdominal walls as a TAP block.  The omentum was brought into the upper abdomen and the transverse mesocolon elevated and the ligament of Treitz clearly identified. A 40 cm biliopancreatic limb was then carefully measured from the ligament of Treitz. The small intestine was divided at this point with a single firing of the white load linear stapler. A Penrose drain was sutured to the end of the Roux-en-Y limb for later identification. A 100 cm Roux-en-Y limb was then carefully measured. At this point a side-to-side anastomosis was created between the Roux limb and the end of the biliopancreatic limb. This was accomplished with a single firing of the 60 mm white load linear stapler. The common enterotomy was closed with a running 2-0 Vicryl begun at either end of the enterotomy and tied centrally. Eviceal tissue sealant was placed over the anastomosis. The mesenteric defect was then closed with running 2-0 silk. The omentum was then divided with the harmonic scalpel up towards the transverse colon to allow mobility of the Roux limb toward the gastric pouch. The patient was then placed in steep reversed Trendelenburg. Through a 5 mm subxiphoid site the [facility] retractor was placed and the left lobe of the liver elevated with excellent exposure of the upper stomach and hiatus.  Her preoperative upper GI showed no evidence of a hiatal  hernia but she had had a remote upper endoscopy that was concerning for a small hiatal hernia.  There is a fair amount of adipose  tissue around the proximal stomach especially along the lesser curve.  I decided to incise the gastrohepatic ligament with Harmonic scalpel.  The right crus the diaphragm was identified and it was incised with harmonic scalpel.  I then ended up reducing a fairly large fat pad from the mediastinum this revealed evidence of a small defect between the left and right crura of the diaphragm.  The left crura of the diaphragm was identified.  I ended up placing one suture between the left and right crease to reapproximate if this was done with a 0 Ethibond with an Endo Stitch secured with a titanium tie knot.  The angle of Hiss was then mobilized with the harmonic scalpel. A 4-5 cm gastric pouch was then carefully measured along the lesser curve of the stomach. Dissection was carried along the lesser curve at this point with the Harmonic scalpel working carefully back toward the lesser sac at right angles to the lesser curve. The free lesser sac was then entered. After being sure all tubes were removed from the stomach an initial firing of the gold load 60 mm linear stapler was fired at right angles across the lesser curve for about 4 cm. The gastric pouch was further mobilized posteriorly and then the pouch was completed with 3 further firings of the 60 mm blue load linear stapler up through the previously dissected angle of His. It was ensured that the pouch was completely mobilized away from the gastric remnant. This created a nice tubular 4-5 cm gastric pouch. The Roux limb was then brought up in an antecolic fashion with the candycane facing to the patient's left without undue tension. The gastrojejunostomy was created with an initial posterior row of 2-0 Vicryl between the Roux limb and the staple line of the gastric pouch. Enterotomies were then made in the gastric pouch and the Roux limb with the harmonic scalpel and at approximately 2-2-1/2 cm anastomosis was created with a single firing of the 41mm blue load  linear stapler. The staple line was inspected and was intact without bleeding. The common enterotomy was then closed with running 2-0 Vicryl begun at either end and tied centrally. There was some bleeding in the gastric pouch wall as the anastomosis was closed. The Ewall tube was then easily passed through the anastomosis and an outer anterior layer of running 2-0 Vicryl was placed. The Ewald tube was removed. With the outlet of the gastrojejunostomy clamped and under saline irrigation the assistant performed upper endoscopy and with the gastric pouch tensely distended with air-there was no evidence of leak on this test. The pouch was desufflated. The Terance Hart defect was closed with running 2-0 silk. The abdomen was inspected for any evidence of bleeding or bowel injury and everything looked fine. The Nathanson retractor was removed under direct vision after coating the anastomosis with Eviceal tissue sealant. All CO2 was evacuated and trochars removed. Skin incisions were closed with 4-0 monocryl in a subcuticular fashion followed by dermabond. Sponge needle and instrument counts were correct. The patient was taken to the PACU in good condition.    Leighton Ruff. Redmond Pulling, MD, FACS General, Bariatric, & Minimally Invasive Surgery [facility], [place]

## 2018-01-20 ENCOUNTER — Encounter (HOSPITAL_COMMUNITY): Payer: Self-pay | Admitting: General Surgery

## 2018-01-20 LAB — CBC WITH DIFFERENTIAL/PLATELET
BASOS ABS: 0 10*3/uL (ref 0.0–0.1)
Basophils Relative: 0 %
EOS ABS: 0 10*3/uL (ref 0.0–0.7)
EOS PCT: 0 %
HCT: 37.3 % (ref 36.0–46.0)
Hemoglobin: 12.1 g/dL (ref 12.0–15.0)
Lymphocytes Relative: 16 %
Lymphs Abs: 1.9 10*3/uL (ref 0.7–4.0)
MCH: 29.7 pg (ref 26.0–34.0)
MCHC: 32.4 g/dL (ref 30.0–36.0)
MCV: 91.4 fL (ref 78.0–100.0)
MONO ABS: 1 10*3/uL (ref 0.1–1.0)
MONOS PCT: 8 %
Neutro Abs: 9 10*3/uL — ABNORMAL HIGH (ref 1.7–7.7)
Neutrophils Relative %: 76 %
Platelets: 353 10*3/uL (ref 150–400)
RBC: 4.08 MIL/uL (ref 3.87–5.11)
RDW: 14.6 % (ref 11.5–15.5)
WBC: 11.8 10*3/uL — ABNORMAL HIGH (ref 4.0–10.5)

## 2018-01-20 LAB — COMPREHENSIVE METABOLIC PANEL
ALK PHOS: 52 U/L (ref 38–126)
ALT: 32 U/L (ref 0–44)
ANION GAP: 10 (ref 5–15)
AST: 25 U/L (ref 15–41)
Albumin: 3.1 g/dL — ABNORMAL LOW (ref 3.5–5.0)
BUN: 7 mg/dL (ref 6–20)
CALCIUM: 8.8 mg/dL — AB (ref 8.9–10.3)
CO2: 26 mmol/L (ref 22–32)
Chloride: 102 mmol/L (ref 98–111)
Creatinine, Ser: 0.52 mg/dL (ref 0.44–1.00)
GFR calc Af Amer: >60 mL/min (ref >60)
GFR calc non Af Amer: >60 mL/min (ref >60)
GLUCOSE: 117 mg/dL — AB (ref 70–99)
POTASSIUM: 3.4 mmol/L — AB (ref 3.5–5.1)
Sodium: 138 mmol/L (ref 135–145)
Total Bilirubin: 0.9 mg/dL (ref 0.3–1.2)
Total Protein: 6.2 g/dL — ABNORMAL LOW (ref 6.5–8.1)

## 2018-01-20 NOTE — Progress Notes (Signed)
Patient ID: Dawn Hayes, female   DOB: [birth year], 47 y.o.   MRN: [id number]   Progress Note: Metabolic and Bariatric Surgery Service   Chief Complaint/Subjective: Walked several times A little nausea at times Some abd discomfort Chronic back pain  Objective: Vital signs in last 24 hours: Temp:  [97.4 F (36.3 C)-98.7 F (37.1 C)] 98.3 F (36.8 C) (09/11 0514) Pulse Rate:  [65-94] 90 (09/11 0514) Resp:  [9-20] 20 (09/11 0514) BP: (127-153)/(75-101) 140/80 (09/11 0514) SpO2:  [94 %-100 %] 96 % (09/11 0514) Last BM Date: 01/18/18  Intake/Output from previous day: 09/10 0701 - 09/11 0700 In: 3703.5 [P.O.:450; I.V.:3053.5; IV Piggyback:200] Out: 1120 [Urine:1095; Blood:25] Intake/Output this shift: No intake/output data recorded.  Lungs: cta  Cardiovascular: reg  Abd: soft, obese, expected mild TTP, incisions ok  Extremities: no edema, +SCDs  Neuro: asleep but arouses approp, nonfocal  Lab Results: CBC  Recent Labs    01/19/18 1105 01/20/18 0505  WBC  --  11.8*  HGB 12.4 12.1  HCT 37.8 37.3  PLT  --  353   BMET Recent Labs    01/20/18 0505  NA 138  K 3.4*  CL 102  CO2 26  GLUCOSE 117*  BUN 7  CREATININE 0.52  CALCIUM 8.8*   PT/INR No results for input(s): LABPROT, INR in the last 72 hours. ABG No results for input(s): PHART, HCO3 in the last 72 hours.  Invalid input(s): PCO2, PO2  Studies/Results:  Anti-infectives: Anti-infectives (From admission, onward)   Start     Dose/Rate Route Frequency Ordered Stop   01/19/18 0600  cefoTEtan (CEFOTAN) 2 g in sodium chloride 0.9 % 100 mL IVPB     2 g 200 mL/hr over 30 Minutes Intravenous On call to O.R. 01/19/18 0542 01/19/18 0729      Medications: Scheduled Meds: . acetaminophen (TYLENOL) oral liquid 160 mg/5 mL  650 mg Oral Q6H  . enoxaparin (LOVENOX) injection  30 mg Subcutaneous Q12H  . gabapentin  200 mg Oral Q12H  . Influenza vac split quadrivalent PF  0.5 mL Intramuscular Tomorrow-1000    . protein supplement shake  2 oz Oral Q2H   Continuous Infusions: . dextrose 5 % and 0.45 % NaCl with KCl 20 mEq/L 125 mL/hr at 01/20/18 0200  . famotidine (PEPCID) IV Stopped (01/19/18 2238)   PRN Meds:.diphenhydrAMINE, enalaprilat, morphine injection, ondansetron (ZOFRAN) IV, oxyCODONE, polyvinyl alcohol, promethazine, simethicone  Assessment/Plan: Patient Active Problem List   Diagnosis Date Noted  . Morbid obesity ([place]) 01/19/2018  . Vitamin D deficiency 11/10/2017  . Fibromyalgia 11/10/2017  . Anxiety and depression 05/27/2016  . Status post total abdominal hysterectomy 04/08/2016  . Costochondritis 12/04/2015  . Anemia, iron deficiency 10/10/2015  . Tick bites 10/10/2015  . Fatigue 10/10/2015  . Allergic rhinitis 09/07/2015  . Hemorrhoid   . Diverticulosis of colon without hemorrhage   . Hiatal hernia   . Anemia 05/08/2015  . Rectal bleeding 05/08/2015  . Chest pain, rule out acute myocardial infarction 09/27/2013  . Abnormal finding on EKG 09/16/2013  . Psoriasis 09/16/2013  . Psoriatic arthritis ([place]) 09/16/2013  . HTN (hypertension) 09/16/2013  . Morbid obesity, unspecified obesity type ([place]) 09/16/2013  . GERD 12/11/2009  . DIARRHEA, CHRONIC 12/11/2009   s/p Procedure(s): LAPAROSCOPIC ROUX-EN-Y GASTRIC BYPASS WITH HIATAL HERNIA REPAIR, WITH UPPER ENDOSCOPY, ERAS Pathway LAPAROSCOPIC LYSIS OF ADHESIONS 01/19/2018  No fever, no tachy. Looks ok Cont postop bari diet Ambulate, is, pulm toilet Cont chemical vte prophylaxis  Disposition:  LOS:  1 day  The patient will be in the hospital for normal postop protocol  Greer Pickerel, MD [phone] [facility]

## 2018-01-20 NOTE — Progress Notes (Signed)
Patient alert and oriented, Post op day 1.  Provided support and encouragement.  Encouraged pulmonary toilet, ambulation and small sips of liquids.  Completed 12 ounces of bari clear fluid.  To start protein, some nausea.  Medicated for pain/nausea.  All questions answered.  Will continue to monitor.

## 2018-01-20 NOTE — Plan of Care (Signed)

## 2018-01-20 NOTE — Progress Notes (Addendum)
Patient nauseated throughout the morning.  Tolerated 2 ounces of protein. Sleeping off and on.  Encouraged patient to increase mobility.  Set expectations of 4-5 walks per day in the hallway.  Patient states understanding.  Discussed plan of care with Jeannine Boga, Bedside RN.  Will continue to monitor

## 2018-01-21 LAB — CBC WITH DIFFERENTIAL/PLATELET
Basophils Absolute: 0 10*3/uL (ref 0.0–0.1)
Basophils Relative: 0 %
EOS ABS: 0 10*3/uL (ref 0.0–0.7)
EOS PCT: 1 %
HCT: 35.6 % — ABNORMAL LOW (ref 36.0–46.0)
Hemoglobin: 11.2 g/dL — ABNORMAL LOW (ref 12.0–15.0)
LYMPHS ABS: 1.8 10*3/uL (ref 0.7–4.0)
Lymphocytes Relative: 24 %
MCH: 29.3 pg (ref 26.0–34.0)
MCHC: 31.5 g/dL (ref 30.0–36.0)
MCV: 93.2 fL (ref 78.0–100.0)
Monocytes Absolute: 0.6 10*3/uL (ref 0.1–1.0)
Monocytes Relative: 8 %
Neutro Abs: 5 10*3/uL (ref 1.7–7.7)
Neutrophils Relative %: 67 %
PLATELETS: 312 10*3/uL (ref 150–400)
RBC: 3.82 MIL/uL — AB (ref 3.87–5.11)
RDW: 15 % (ref 11.5–15.5)
WBC: 7.6 10*3/uL (ref 4.0–10.5)

## 2018-01-21 MED ORDER — ONDANSETRON 4 MG PO TBDP: 4.0000 mg | ORAL_TABLET | Freq: Four times a day (QID) | ORAL | 0 refills | Status: DC | PRN

## 2018-01-21 NOTE — Progress Notes (Signed)
Patient alert and oriented, Post op day 2.  Provided support and encouragement.  Encouraged pulmonary toilet, ambulation and small sips of liquids.  Completed 4 ounces of protein and 2 ounces of water this am.  Reports a morning walk in hallway.  Plan to discharge today, await husband for discharge education.  All questions answered.  Will continue to monitor.

## 2018-01-21 NOTE — Progress Notes (Signed)
Assessment unchanged. Pt and husband verbalized understanding of all dc instructions through teach back given by bariatric nurse coordinator as well as primary care nurse. No scripts at dc. Discharged via wc to front entrance accompanied by husband and NT.

## 2018-01-21 NOTE — Progress Notes (Signed)
Patient alert and oriented, pain is controlled. Patient is tolerating fluids, advanced to protein shake today, patient is tolerating well. Reviewed Gastric Bypass discharge instructions with patient and patient is able to articulate understanding. Provided information on BELT program, Support Group and [facility] outpatient pharmacy. All questions answered, will continue to monitor.   Total fluid intake 750 Per dehydration protocol call back one week post op

## 2018-01-21 NOTE — Discharge Summary (Signed)
Physician Discharge Summary  Dawn Hayes HDQ:[id number] DOB: 03/09/1971 DOA: 01/19/2018  PCP: Midge Minium, MD  Admit date: 01/19/2018 Discharge date: 01/21/2018  Recommendations for Outpatient Follow-up:    Follow-up Information    Greer Pickerel, MD. Go on 02/11/2018.   Specialty:  General Surgery Why:  at 1015 Contact information: [address] [place] [address] [phone]        Carlena Hurl, PA-C .   Specialty:  General Surgery Contact information: [place] [address] [phone]          Discharge Diagnoses:  Principal Problem:   Morbid obesity, unspecified obesity type ([place]) Active Problems:   GERD   HTN (hypertension)   Hiatal hernia   Fibromyalgia   Morbid obesity ([place])   Surgical Procedure: Laparoscopic Roux-en-Y gastric bypass with hiatal hernia repair, upper endoscopy  Discharge Condition: Good Disposition: Home  Diet recommendation: Postoperative gastric bypass diet  Filed Weights   01/19/18 0620 01/20/18 0726 01/21/18 0638  Weight: 117.5 kg (!) 141.9 kg (!) 139 kg     Hospital Course:  The patient was admitted for a planned laparoscopic Roux-en-Y gastric bypass. Please see operative note. Preoperatively the patient was given 5000 units of subcutaneous heparin for DVT prophylaxis. ERAS protocol was used. Postoperative prophylactic Lovenox dosing was started on the evening of postoperative day 0.  The patient was started on ice chips and water on the evening of POD 0 which they tolerated. On postoperative day 1 The patient's diet was advanced to protein shakes which they also tolerated but she had nausea preventing her from reaching fluid goals. On POD 2, The patient was ambulating without difficulty. Their vital signs are stable without fever or tachycardia. Their hemoglobin had remained stable. Her nausea was significantly improved. The patient had received discharge instructions and counseling.  They were deemed stable for discharge.  BP (!) 142/82 (BP Location: Left Arm)   Pulse 84   Temp 98.3 F (36.8 C) (Oral)   Resp 17   Ht 5' 1.5" (1.562 m)   Wt (!) 139 kg   LMP 02/28/2016   SpO2 98%   BMI 56.96 kg/m   Gen: alert, NAD, non-toxic appearing Pupils: equal, no scleral icterus Pulm: Lungs clear to auscultation, symmetric chest rise CV: regular rate and rhythm Abd: soft, min tender, nondistended. No cellulitis. No incisional hernia Ext: no edema, no calf tenderness Skin: no rash, no jaundice  Discharge Instructions  Discharge Instructions    Ambulate hourly while awake   Complete by:  As directed    Call MD for:  difficulty breathing, headache or visual disturbances   Complete by:  As directed    Call MD for:  persistant dizziness or light-headedness   Complete by:  As directed    Call MD for:  persistant nausea and vomiting   Complete by:  As directed    Call MD for:  redness, tenderness, or signs of infection (pain, swelling, redness, odor or green/yellow discharge around incision site)   Complete by:  As directed    Call MD for:  severe uncontrolled pain   Complete by:  As directed    Call MD for:  temperature >101 F   Complete by:  As directed    Diet bariatric full liquid   Complete by:  As directed    Discharge instructions   Complete by:  As directed    See bariatric discharge instructions   Incentive spirometry  Complete by:  As directed    Perform hourly while awake     Allergies as of 01/21/2018      Reactions   Aspirin Other (See Comments)   Convulsions    Toradol [ketorolac Tromethamine] Nausea And Vomiting   Adhesive [tape] Itching, Rash      Medication List    STOP taking these medications   celecoxib 200 MG capsule Commonly known as:  CELEBREX   CHANTIX 1 MG tablet Generic drug:  varenicline   predniSONE 10 MG tablet Commonly known as:  DELTASONE     TAKE these medications   acetaminophen-codeine 300-60 MG tablet Commonly  known as:  TYLENOL #4 Take 1 tablet by mouth every 4 (four) hours as needed for pain.   BIOFREEZE EX Apply 1 application topically 4 (four) times daily as needed (for muscle/joint pain.).   diclofenac sodium 1 % Gel Commonly known as:  VOLTAREN Apply 2-4 g topically 4 (four) times daily as needed (for muscle/joint pain.).   DULoxetine 60 MG capsule Commonly known as:  CYMBALTA Take 1 capsule (60 mg total) by mouth daily. What changed:  when to take this   esomeprazole 40 MG capsule Commonly known as:  NEXIUM TAKE 1 CAPSULE(40 MG) BY MOUTH DAILY BEFORE BREAKFAST   furosemide 20 MG tablet Commonly known as:  LASIX Take 1 tablet (20 mg total) by mouth daily. As needed for swelling What changed:    when to take this  reasons to take this  additional instructions   hydrochlorothiazide 25 MG tablet Commonly known as:  HYDRODIURIL Take 1 tablet (25 mg total) by mouth daily. Notes to patient:  Monitor Blood Pressure Daily and keep a log for primary care physician.  Monitor for symptoms of dehydration.  You may need to make changes to your medications with rapid weight loss.     HYDROcodone-acetaminophen 10-325 MG tablet Commonly known as:  NORCO Take 1 tablet by mouth 3 (three) times daily as needed (for pain).   multivitamin with minerals Tabs tablet Take 1 tablet by mouth daily.   nystatin 100000 UNIT/ML suspension Commonly known as:  MYCOSTATIN SWISH AND SPIT 5 ML BY MOUTH FOUR TIMES DAILY AS NEEDED FOR THRUSH What changed:    how much to take  how to take this  when to take this  reasons to take this  additional instructions   ondansetron 4 MG disintegrating tablet Commonly known as:  ZOFRAN-ODT Take 1 tablet (4 mg total) by mouth every 6 (six) hours as needed for nausea or vomiting.   STELARA 130 MG/26ML Soln injection Generic drug:  ustekinumab Inject 130 mg into the vein every 3 (three) months.   triamcinolone cream 0.1 % Commonly known as:   KENALOG Apply 1 application topically daily as needed (for irritation).   VITAMIN D3 PO Take 1 tablet by mouth daily with lunch.   XIIDRA 5 % Soln Generic drug:  Lifitegrast Place 1 drop into both eyes daily as needed (dry/irritated eyes.).      Follow-up Information    Greer Pickerel, MD. Go on 02/11/2018.   Specialty:  General Surgery Why:  at 1015 Contact information: [address] [place] [address] [phone]        Carlena Hurl, PA-C .   Specialty:  General Surgery Contact information: [place] [address] [phone]            The results of significant diagnostics from this hospitalization (including imaging,  microbiology, ancillary and laboratory) are listed below for reference.    Significant Diagnostic Studies: No results found.  Labs: Basic Metabolic Panel: Recent Labs  Lab 01/20/18 0505  NA 138  K 3.4*  CL 102  CO2 26  GLUCOSE 117*  BUN 7  CREATININE 0.52  CALCIUM 8.8*   Liver Function Tests: Recent Labs  Lab 01/20/18 0505  AST 25  ALT 32  ALKPHOS 52  BILITOT 0.9  PROT 6.2*  ALBUMIN 3.1*    CBC: Recent Labs  Lab 01/19/18 1105 01/20/18 0505 01/21/18 0422  WBC  --  11.8* 7.6  NEUTROABS  --  9.0* 5.0  HGB 12.4 12.1 11.2*  HCT 37.8 37.3 35.6*  MCV  --  91.4 93.2  PLT  --  353 312    CBG: No results for input(s): GLUCAP in the last 168 hours.  Principal Problem:   Morbid obesity, unspecified obesity type ([place]) Active Problems:   GERD   HTN (hypertension)   Hiatal hernia   Fibromyalgia   Morbid obesity ([place])   Time coordinating discharge: 15 min  Signed:  Gayland Curry, MD [facility], [place] [phone] 01/21/2018, 9:00 AM

## 2018-01-25 ENCOUNTER — Telehealth (HOSPITAL_COMMUNITY): Payer: Self-pay

## 2018-01-25 NOTE — Telephone Encounter (Signed)
Patient called to discuss post bariatric surgery follow up questions.  See below: ° ° °1.  Tell me about your pain and pain management? ° °2.  Let's talk about fluid intake.  How much total fluid are you taking in? ° °3.  How much protein have you taken in the last 2 days? ° °4.  Have you had nausea?  Tell me about when have experienced nausea and what you did to help? ° °5.  Has the frequency or color changed with your urine? ° °6.  Tell me what your incisions look like? ° °7.  Have you been passing gas? BM? ° °8.  If a problem or question were to arise who would you call?  Do you know contact numbers for [facility], [facility], and [facility]? ° °9.  How has the walking going? ° °10.  How are your vitamins and calcium going?  How are you taking them? °

## 2018-01-26 ENCOUNTER — Other Ambulatory Visit: Payer: Self-pay

## 2018-01-26 ENCOUNTER — Other Ambulatory Visit: Payer: Self-pay | Admitting: General Surgery

## 2018-01-26 ENCOUNTER — Ambulatory Visit: Payer: 59 | Admitting: Family Medicine

## 2018-01-26 ENCOUNTER — Telehealth (HOSPITAL_COMMUNITY): Payer: Self-pay

## 2018-01-26 ENCOUNTER — Encounter: Payer: Self-pay | Admitting: Family Medicine

## 2018-01-26 VITALS — BP 138/86 | HR 78 | Temp 98.1°F | Resp 16 | Ht 62.0 in | Wt 252.0 lb

## 2018-01-26 DIAGNOSIS — M797 Fibromyalgia: Secondary | ICD-10-CM | POA: Diagnosis not present

## 2018-01-26 DIAGNOSIS — Z9884 Bariatric surgery status: Secondary | ICD-10-CM | POA: Diagnosis not present

## 2018-01-26 DIAGNOSIS — G2581 Restless legs syndrome: Secondary | ICD-10-CM | POA: Diagnosis not present

## 2018-01-26 MED ORDER — ROPINIROLE HCL 0.5 MG PO TABS: ORAL_TABLET | ORAL | 3 refills | Status: DC

## 2018-01-26 NOTE — Assessment & Plan Note (Signed)
New.  Start Ropinirole nightly and titrate to effective dose.  Pt expressed understanding and is in agreement w/ plan.

## 2018-01-26 NOTE — Assessment & Plan Note (Signed)
Ongoing issue.  Pt is fearful that she will flare now that she stopped her NSAIDs.  Encouraged her not to focus on that and instead enjoy that she is feeling well.  Continue Cymbalta.  Start Requip for improved sleep which should improve pain.  Pt expressed understanding and is in agreement w/ plan.

## 2018-01-26 NOTE — Progress Notes (Signed)
   Subjective:    Patient ID: Dawn Hayes, female    DOB: Dec 14, 1970, 47 y.o.   MRN: [id number]  HPI Bariatric surgery- Roux-en-Y done 9/10. Had a few elevated readings while in the hospital.  Has been tracking BP at home and it has been fine 110/72, 112/84.  Denies CP, SOB, HAs, visual changes, edema, N/V.  Had to stop Celebrex due to surgery.  Currently on Cymbalta for fibro and pain meds from surgery.  Pt reports she continues to have poor sleep due to 'restless leg'.  Is worried that she will have Fibro flares b/c she has stopped the NSAIDs.  Reviewed past medical, surgical, family and social histories.    Review of Systems For ROS see HPI     Objective:   Physical Exam  Constitutional: She is oriented to person, place, and time. She appears well-developed and well-nourished. No distress.  Morbidly obese  HENT:  Head: Normocephalic and atraumatic.  Neurological: She is alert and oriented to person, place, and time.  Skin: Skin is warm and dry.  Psychiatric: She has a normal mood and affect. Her behavior is normal. Thought content normal.  Vitals reviewed.         Assessment & Plan:

## 2018-01-26 NOTE — Patient Instructions (Signed)
Follow up as needed or as scheduled Switch the Duloxetine to the AM and START the Ropinirole nightly for Restless Leg I am so proud of your weight loss! Call with any questions or concerns Happy Fall!!

## 2018-01-26 NOTE — Assessment & Plan Note (Signed)
Pt had Roux-en-Y done 9/10 by Dr Redmond Pulling.  Pt is down 12 lbs and feeling good.  Applauded her efforts.  Will continue to follow.

## 2018-01-26 NOTE — Telephone Encounter (Signed)
Follow up Patient called to discuss post bariatric surgery follow up questions.  See below:  Going to hydration clinic after calling office on 01-25-18 with dizziness and frequent BM's  1.  Tell me about your pain and pain management?crampy with bowel movements, dizziness  2.  Let's talk about fluid intake.  How much total fluid are you taking in?14-20 ounces  3.  How much protein have you taken in the last 2 days?30 grams  4.  Have you had nausea?  Tell me about when have experienced nausea and what you did to help?nausea at times  5.  Has the frequency or color changed with your urine?making urine, less amount dark yellow  6.  Tell me what your incisions look like?itchy no problem  7.  Have you been passing gas? BM?frequent loose bms talked with office  8.  If a problem or question were to arise who would you call?  Do you know contact numbers for [facility], [facility], and [facility]?awre of how to contact all services  9.  How has the walking going?walking around regularly  10.  How are your vitamins and calcium going?  How are you taking them?taking calcium not vitamins due to nausea

## 2018-01-27 ENCOUNTER — Ambulatory Visit (HOSPITAL_COMMUNITY)
Admission: RE | Admit: 2018-01-27 | Discharge: 2018-01-27 | Disposition: A | Discharge status: Home / Self Care | Payer: 59 | Source: Ambulatory Visit | Attending: General Surgery | Admitting: General Surgery

## 2018-01-27 DIAGNOSIS — R110 Nausea: Secondary | ICD-10-CM | POA: Diagnosis present

## 2018-01-27 MED ORDER — ACETAMINOPHEN 325 MG RE SUPP
325.0000 mg | RECTAL | Status: DC | PRN
  Filled 2018-01-27: qty 2

## 2018-01-27 MED ORDER — ACETAMINOPHEN 160 MG/5ML PO SOLN
325.0000 mg | ORAL | Status: DC | PRN
  Filled 2018-01-27: qty 20.3

## 2018-01-27 MED ORDER — THIAMINE HCL 100 MG/ML IJ SOLN
INTRAVENOUS | Status: DC
  Administered 2018-01-27: 12:00:00 via INTRAVENOUS
  Filled 2018-01-27 (×4): qty 1000

## 2018-01-27 MED ORDER — ONDANSETRON 4 MG PO TBDP: 4.0000 mg | ORAL_TABLET | ORAL | Status: DC | PRN

## 2018-01-27 MED ORDER — SODIUM CHLORIDE 0.9 % IV SOLN: INTRAVENOUS | Status: DC

## 2018-01-27 MED ORDER — ACETAMINOPHEN 325 MG PO TABS: 325.0000 mg | ORAL_TABLET | ORAL | Status: DC | PRN

## 2018-01-27 MED ORDER — SODIUM CHLORIDE 0.9 % IV BOLUS
1000.0000 mL | Freq: Once | INTRAVENOUS | Status: AC
  Administered 2018-01-27: 1000 mL via INTRAVENOUS

## 2018-01-27 MED ORDER — ONDANSETRON HCL 4 MG/2ML IJ SOLN: 4.0000 mg | INTRAMUSCULAR | Status: DC | PRN

## 2018-01-27 NOTE — Progress Notes (Signed)
Patient received  1 L of banana bag followed by 1 L bolus of Normal saline as prescribed via PIV. Tolerated well, vitals stable, discharge instructions given, verbalized understanding. Patient alert, oriented and ambulatory at the time of discharge.

## 2018-01-27 NOTE — Discharge Instructions (Signed)
Dehydration, Adult Dehydration is when there is not enough fluid or water in your body. This happens when you lose more fluids than you take in. Dehydration can range from mild to very bad. It should be treated right away to keep it from getting very bad. Symptoms of mild dehydration may include:  Thirst.  Dry lips.  Slightly dry mouth.  Dry, warm skin.  Dizziness. Symptoms of moderate dehydration may include:  Very dry mouth.  Muscle cramps.  Dark pee (urine). Pee may be the color of tea.  Your body making less pee.  Your eyes making fewer tears.  Heartbeat that is uneven or faster than normal (palpitations).  Headache.  Light-headedness, especially when you stand up from sitting.  Fainting (syncope). Symptoms of very bad dehydration may include:  Changes in skin, such as: ? Cold and clammy skin. ? Blotchy (mottled) or pale skin. ? Skin that does not quickly return to normal after being lightly pinched and let go (poor skin turgor).  Changes in body fluids, such as: ? Feeling very thirsty. ? Your eyes making fewer tears. ? Not sweating when body temperature is high, such as in hot weather. ? Your body making very little pee.  Changes in vital signs, such as: ? Weak pulse. ? Pulse that is more than 100 beats a minute when you are sitting still. ? Fast breathing. ? Low blood pressure.  Other changes, such as: ? Sunken eyes. ? Cold hands and feet. ? Confusion. ? Lack of energy (lethargy). ? Trouble waking up from sleep. ? Short-term weight loss. ? Unconsciousness. Follow these instructions at home:  If told by your doctor, drink an ORS: ? Make an ORS by using instructions on the package. ? Start by drinking small amounts, about  cup (120 mL) every 5-10 minutes. ? Slowly drink more until you have had the amount that your doctor said to have.  Drink enough clear fluid to keep your pee clear or pale yellow. If you were told to drink an ORS, finish the ORS  first, then start slowly drinking clear fluids. Drink fluids such as: ? Water. Do not drink only water by itself. Doing that can make the salt (sodium) level in your body get too low (hyponatremia). ? Ice chips. ? Fruit juice that you have added water to (diluted). ? Low-calorie sports drinks.  Avoid: ? Alcohol. ? Drinks that have a lot of sugar. These include high-calorie sports drinks, fruit juice that does not have water added, and soda. ? Caffeine. ? Foods that are greasy or have a lot of fat or sugar.  Take over-the-counter and prescription medicines only as told by your doctor.  Do not take salt tablets. Doing that can make the salt level in your body get too high (hypernatremia).  Eat foods that have minerals (electrolytes). Examples include bananas, oranges, potatoes, tomatoes, and spinach.  Keep all follow-up visits as told by your doctor. This is important. Contact a doctor if:  You have belly (abdominal) pain that: ? Gets worse. ? Stays in one area (localizes).  You have a rash.  You have a stiff neck.  You get angry or annoyed more easily than normal (irritability).  You are more sleepy than normal.  You have a harder time waking up than normal.  You feel: ? Weak. ? Dizzy. ? Very thirsty.  You have peed (urinated) only a small amount of very dark pee during 6-8 hours. Get help right away if:  You have symptoms of   very bad dehydration.  You cannot drink fluids without throwing up (vomiting).  Your symptoms get worse with treatment.  You have a fever.  You have a very bad headache.  You are throwing up or having watery poop (diarrhea) and it: ? Gets worse. ? Does not go away.  You have blood or something green (bile) in your throw-up.  You have blood in your poop (stool). This may cause poop to look black and tarry.  You have not peed in 6-8 hours.  You pass out (faint).  Your heart rate when you are sitting still is more than 100 beats a  minute.  You have trouble breathing. This information is not intended to replace advice given to you by your health care provider. Make sure you discuss any questions you have with your health care provider. Document Released: 02/22/2009 Document Revised: 11/16/2015 Document Reviewed: 06/22/2015 Elsevier Interactive Patient Education  2018 Elsevier Inc.  

## 2018-01-27 NOTE — Progress Notes (Signed)
Pt arrived for infusion of IV fluids per order; upon arrival, attempted to establish IV access with no success; order placed for IV team consult; IV nurse in with patient for IV insertion attempt; will continue to monitor

## 2018-02-01 ENCOUNTER — Telehealth: Payer: Self-pay | Admitting: General Practice

## 2018-02-01 MED ORDER — ONDANSETRON HCL 4 MG PO TABS: 4.0000 mg | ORAL_TABLET | Freq: Three times a day (TID) | ORAL | 0 refills | Status: DC | PRN

## 2018-02-01 NOTE — Telephone Encounter (Signed)
Pt made aware, stated an understanding and medication filled to local pharmacy.

## 2018-02-01 NOTE — Telephone Encounter (Signed)
Ok for Zofran ODT 4mg  TID prn nausea, #20, no refills.  And she needs to take the Cymbalta w/ food

## 2018-02-01 NOTE — Telephone Encounter (Signed)
Please advise?   Copied from [place] [id number]. Topic: General - Other >> Feb 01, 2018  1:53 PM Carolyn Stare wrote:   Pt said she started back taking DULoxetine (CYMBALTA) 60 MG capsule  and it is making her nausea and is asking if something can be called in for the nausea    Zofran she said is  sublingual   Pharmacy [facility]

## 2018-02-02 ENCOUNTER — Other Ambulatory Visit: Payer: Self-pay | Admitting: Family Medicine

## 2018-02-02 ENCOUNTER — Encounter: Payer: 59 | Attending: General Surgery | Admitting: Skilled Nursing Facility1

## 2018-02-02 DIAGNOSIS — Z6841 Body Mass Index (BMI) 40.0 and over, adult: Secondary | ICD-10-CM | POA: Insufficient documentation

## 2018-02-02 DIAGNOSIS — Z79899 Other long term (current) drug therapy: Secondary | ICD-10-CM | POA: Diagnosis not present

## 2018-02-02 DIAGNOSIS — Z713 Dietary counseling and surveillance: Secondary | ICD-10-CM | POA: Diagnosis not present

## 2018-02-02 DIAGNOSIS — Z886 Allergy status to analgesic agent status: Secondary | ICD-10-CM | POA: Diagnosis not present

## 2018-02-02 DIAGNOSIS — E669 Obesity, unspecified: Secondary | ICD-10-CM

## 2018-02-03 ENCOUNTER — Encounter: Payer: Self-pay | Admitting: Skilled Nursing Facility1

## 2018-02-03 NOTE — Progress Notes (Signed)
Bariatric Class:  Appt start time: 1530 end time:  1630.  2 Week Post-Operative Nutrition Class  Patient was seen on 02/02/2018 for Post[facility]ative Nutrition education [facility].   Pt has needed the rehydration clinic. Pt is still struggling to meet her 64 fluid ounce goal. Dietitian will call pt 02/04/2018 to assess her fluid progress.   Surgery date: 01/19/2018 Surgery type: RYGB Start weight at [facility]: 273.5 Weight today: 273  TANITA  BODY COMP RESULTS  02/02/2018   BMI (kg/m^2) 44.8   Fat Mass (lbs) 126.6   Fat Free Mass (lbs) 118.4   Total Body Water (lbs) 86.6   The following the learning objectives were met by the patient during this course:  Identifies Phase 3A (Soft, High Proteins) Dietary Goals and will begin from 2 weeks post-operatively to 2 months post-operatively  Identifies appropriate sources of fluids and proteins   States protein recommendations and appropriate sources post-operatively  Identifies the need for appropriate texture modifications, mastication, and bite sizes when consuming solids  Identifies appropriate multivitamin and calcium sources post-operatively  Describes the need for physical activity post-operatively and will follow MD recommendations  States when to call healthcare provider regarding medication questions or post-operative complications  Handouts given during class include:  Phase 3A: Soft, High Protein Diet Handout  Follow-Up Plan: Patient will follow-up at [facility] in 6 weeks for 2 month post-op nutrition visit for diet advancement per MD.    

## 2018-02-04 ENCOUNTER — Other Ambulatory Visit: Payer: Self-pay

## 2018-02-04 ENCOUNTER — Encounter: Payer: Self-pay | Admitting: Family Medicine

## 2018-02-04 ENCOUNTER — Telehealth: Payer: Self-pay | Admitting: Skilled Nursing Facility1

## 2018-02-04 ENCOUNTER — Ambulatory Visit: Payer: 59 | Admitting: Family Medicine

## 2018-02-04 VITALS — BP 122/80 | HR 88 | Temp 98.1°F | Ht 62.0 in | Wt 244.6 lb

## 2018-02-04 DIAGNOSIS — L4050 Arthropathic psoriasis, unspecified: Secondary | ICD-10-CM | POA: Diagnosis not present

## 2018-02-04 DIAGNOSIS — M797 Fibromyalgia: Secondary | ICD-10-CM | POA: Diagnosis not present

## 2018-02-04 DIAGNOSIS — L409 Psoriasis, unspecified: Secondary | ICD-10-CM | POA: Diagnosis not present

## 2018-02-04 DIAGNOSIS — L231 Allergic contact dermatitis due to adhesives: Secondary | ICD-10-CM | POA: Diagnosis not present

## 2018-02-04 MED ORDER — BETAMETHASONE DIPROPIONATE 0.05 % EX CREA: TOPICAL_CREAM | Freq: Two times a day (BID) | CUTANEOUS | 0 refills | Status: DC

## 2018-02-04 NOTE — Telephone Encounter (Signed)
Dietitian called pt to assess her progress from liquid to solid diet and hydration status.   Pt states he is drinking protein shakes and water and eating solid foods without issue.   stating she is getting about 45 ounces of water and 60 grams of protein.  Dietitian told pt she would call her again Monday to ensure she got up to 64 fluid ounces.

## 2018-02-04 NOTE — Progress Notes (Signed)
Subjective  CC:  Chief Complaint  Patient presents with  . Rash    Had Gastric Bypass on 01/19/2018, rash near incision site. Red and itchy     HPI: Dawn Hayes is a 47 y.o. female who presents to the office today to address the problems listed above in the chief complaint.  S/p gastric bypass 01/19/2018 - c/o 9 days of red itching rash on abdomen. Has been using benadryl cream with some relief from itching but rash persists. Not spreading. No associated systemic sxs.    ROS + constipation Assessment  1. Allergic contact dermatitis due to adhesives      Plan   Allergic irritant dermatitis, possibly related to adhesives but uncertain: Apply steroid cream twice daily.  Start antihistamines for itching.  Follow-up if unimproved.  Patient to follow-up with her surgeon regarding constipation  Follow up: No follow-ups on file.   No orders of the defined types were placed in this encounter.  Meds ordered this encounter  Medications  . betamethasone dipropionate (DIPROLENE) 0.05 % cream    Sig: Apply topically 2 (two) times daily.    Dispense:  45 g    Refill:  0      I reviewed the patients updated PMH, FH, and SocHx.    Patient Active Problem List   Diagnosis Date Noted  . Nausea 01/27/2018  . Bariatric surgery status 01/26/2018  . RLS (restless legs syndrome) 01/26/2018  . Morbid obesity ([place]) 01/19/2018  . Vitamin D deficiency 11/10/2017  . Fibromyalgia 11/10/2017  . Anxiety and depression 05/27/2016  . Status post total abdominal hysterectomy 04/08/2016  . Costochondritis 12/04/2015  . Anemia, iron deficiency 10/10/2015  . Tick bites 10/10/2015  . Fatigue 10/10/2015  . Allergic rhinitis 09/07/2015  . Hemorrhoid   . Diverticulosis of colon without hemorrhage   . Hiatal hernia   . Anemia 05/08/2015  . Rectal bleeding 05/08/2015  . Chest pain, rule out acute myocardial infarction 09/27/2013  . Abnormal finding on EKG 09/16/2013  . Psoriasis 09/16/2013  .  Psoriatic arthritis ([place]) 09/16/2013  . HTN (hypertension) 09/16/2013  . Morbid obesity, unspecified obesity type ([place]) 09/16/2013  . GERD 12/11/2009  . DIARRHEA, CHRONIC 12/11/2009   Current Meds  Medication Sig  . DULoxetine (CYMBALTA) 60 MG capsule Take 1 capsule (60 mg total) by mouth daily.  Marland Kitchen esomeprazole (NEXIUM) 40 MG capsule TAKE 1 CAPSULE(40 MG) BY MOUTH DAILY BEFORE BREAKFAST  . hydrochlorothiazide (HYDRODIURIL) 25 MG tablet TAKE 1 TABLET(25 MG) BY MOUTH DAILY  . ondansetron (ZOFRAN-ODT) 4 MG disintegrating tablet Take 1 tablet (4 mg total) by mouth every 6 (six) hours as needed for nausea or vomiting.  Marland Kitchen rOPINIRole (REQUIP) 0.5 MG tablet Take 1/2 tab nightly x3 days and then increase to 1 tab nightly  . triamcinolone cream (KENALOG) 0.1 % Apply 1 application topically daily as needed (for irritation).   Marland Kitchen ustekinumab (STELARA) 130 MG/26ML SOLN injection Inject 130 mg into the vein every 3 (three) months.   Marland Kitchen XIIDRA 5 % SOLN Place 1 drop into both eyes daily as needed (dry/irritated eyes.).   . [DISCONTINUED] furosemide (LASIX) 20 MG tablet TAKE 1 TABLET(20 MG) BY MOUTH DAILY AS NEEDED FOR SWELLING  . [DISCONTINUED] ondansetron (ZOFRAN) 4 MG tablet Take 1 tablet (4 mg total) by mouth every 8 (eight) hours as needed for nausea or vomiting.    Allergies: Patient is allergic to aspirin; toradol [ketorolac tromethamine]; and adhesive [tape]. Family History: Patient family history includes Aneurysm in  her maternal grandfather; Brain cancer in her father; Cancer in her other; Colon cancer (age of onset: 1) in her maternal grandmother; Hyperlipidemia in her mother; Hypertension in her other; Other in her paternal uncle; Prostate cancer in her father; Stroke in her paternal grandfather. Social History:  Patient  reports that she has quit smoking. Her smoking use included cigarettes. She has a 2.00 pack-year smoking history. She has never used smokeless tobacco. She reports that she  drinks about 16.0 standard drinks of alcohol per week. She reports that she does not use drugs.  Review of Systems: Constitutional: Negative for fever malaise or anorexia Cardiovascular: negative for chest pain Respiratory: negative for SOB or persistent cough Gastrointestinal: negative for abdominal pain  Objective  Vitals: BP 122/80   Pulse 88   Temp 98.1 F (36.7 C)   Ht 5\' 2"  (1.575 m)   Wt 244 lb 9.6 oz (110.9 kg)   LMP 02/28/2016   SpO2 99%   BMI 44.74 kg/m  General: no acute distress , A&Ox3 Skin:  Warm, abdominal wall with diffuse erythematous macular rash.  Several well healed incisions are present without redness, irritation or drainage.  No urticaria.     Commons side effects, risks, benefits, and alternatives for medications and treatment plan prescribed today were discussed, and the patient expressed understanding of the given instructions. Patient is instructed to call or message via MyChart if he/she has any questions or concerns regarding our treatment plan. No barriers to understanding were identified. We discussed Red Flag symptoms and signs in detail. Patient expressed understanding regarding what to do in case of urgent or emergency type symptoms.   Medication list was reconciled, printed and provided to the patient in AVS. Patient instructions and summary information was reviewed with the patient as documented in the AVS. This note was prepared with assistance of Dragon voice recognition software. Occasional wrong-word or sound-a-like substitutions may have occurred due to the inherent limitations of voice recognition software

## 2018-02-04 NOTE — Patient Instructions (Signed)
Please follow up if symptoms do not improve or as needed.   Use the cream and zyrtec for the next week or so. This should help calm down the rash. Avoid tapes and adhesives in the future as possible.

## 2018-02-08 DIAGNOSIS — M797 Fibromyalgia: Secondary | ICD-10-CM | POA: Diagnosis not present

## 2018-02-08 DIAGNOSIS — L4050 Arthropathic psoriasis, unspecified: Secondary | ICD-10-CM | POA: Diagnosis not present

## 2018-02-08 DIAGNOSIS — M47816 Spondylosis without myelopathy or radiculopathy, lumbar region: Secondary | ICD-10-CM | POA: Diagnosis not present

## 2018-02-08 DIAGNOSIS — G894 Chronic pain syndrome: Secondary | ICD-10-CM | POA: Diagnosis not present

## 2018-02-09 ENCOUNTER — Telehealth: Payer: Self-pay | Admitting: Registered"

## 2018-02-09 NOTE — Telephone Encounter (Signed)
RD called pt to verify fluid intake once starting soft, solid proteins 2 week post-bariatric surgery. Pt unavailable. RD left voicemail.

## 2018-02-10 ENCOUNTER — Telehealth: Payer: Self-pay | Admitting: General Practice

## 2018-02-10 NOTE — Telephone Encounter (Signed)
Copied from [place] [phone]. Topic: General - Other >> Feb 10, 2018  1:08 PM Yvette Rack wrote: Reason for CRM: pt calling stating that she cant take any more time release medicine because of her Bariatric surgery status so she would need something else instead of the DULoxetine (CYMBALTA) 60 MG capsule if its not in the delayed  release form      [place], [place] - [address] [phone] (Phone) [phone] (Fax)

## 2018-02-10 NOTE — Telephone Encounter (Signed)
Patient notified of PCP recommendations and is agreement and expresses an understanding.   Ok for [facility] to Discuss results / PCP recommendations / Schedule patient.   

## 2018-02-10 NOTE — Telephone Encounter (Signed)
According to our pharmacist, he indicates that you are able to continue Cymbalta and he would recommend this

## 2018-02-10 NOTE — Telephone Encounter (Signed)
Spoke with Suezanne Jacquet at [facility]. He states that with the quick journal sear he completed the medication has a delayed release by standard practice and is enteric coated. All the things you would want in Bariatric surgery. The only issues is that is may have some decreased effectiveness and absorption but that is to be expected with Bariatric surgery as well.

## 2018-02-10 NOTE — Telephone Encounter (Signed)
I am finding conflicting information when doing a journal search.  Some say to continue Cymbalta, others say to stop it.  I would prefer if we could ask Pharmacist Suezanne Jacquet and get a direct answer to this question.  Please call him if he's available.

## 2018-02-16 ENCOUNTER — Telehealth: Payer: Self-pay | Admitting: Family Medicine

## 2018-02-16 NOTE — Telephone Encounter (Signed)
Copied from [place] [id number]. Topic: Quick Communication - Rx Refill/Question >> Feb 16, 2018  2:41 PM Margot Ables wrote: Medication: pt has MRI scheduled for Saturday 10/12 - she is claustrophobic - she is asking for xanax or something that she had before to help her relax for MRI. Preferred Pharmacy (with phone number or street name): [id number] - [place], [place] - [address] [place] [address] [phone] (Phone) [phone] (Fax)

## 2018-02-16 NOTE — Telephone Encounter (Signed)
Please advise 

## 2018-02-17 MED ORDER — ALPRAZOLAM 0.5 MG PO TBDP: ORAL_TABLET | ORAL | 0 refills | Status: DC

## 2018-02-17 NOTE — Telephone Encounter (Signed)
Medication filled to pharmacy as requested.  Pt made aware.  

## 2018-02-17 NOTE — Telephone Encounter (Signed)
Ok for Alprazolam 0.5mg  1 tab prior to MRI and 1 tab after if needed, #2, no refills

## 2018-02-20 ENCOUNTER — Ambulatory Visit
Admission: RE | Admit: 2018-02-20 | Discharge: 2018-02-20 | Disposition: A | Discharge status: Home / Self Care | Payer: 59 | Source: Ambulatory Visit | Attending: Pain Medicine | Admitting: Pain Medicine

## 2018-02-20 DIAGNOSIS — M4805 Spinal stenosis, thoracolumbar region: Secondary | ICD-10-CM | POA: Diagnosis not present

## 2018-02-20 DIAGNOSIS — M5124 Other intervertebral disc displacement, thoracic region: Secondary | ICD-10-CM | POA: Diagnosis not present

## 2018-02-20 DIAGNOSIS — M4804 Spinal stenosis, thoracic region: Secondary | ICD-10-CM | POA: Diagnosis not present

## 2018-02-20 DIAGNOSIS — M546 Pain in thoracic spine: Secondary | ICD-10-CM

## 2018-02-26 DIAGNOSIS — M546 Pain in thoracic spine: Secondary | ICD-10-CM | POA: Diagnosis not present

## 2018-03-02 ENCOUNTER — Encounter: Payer: Self-pay | Admitting: Family Medicine

## 2018-03-02 MED ORDER — GABAPENTIN 100 MG PO CAPS: 100.0000 mg | ORAL_CAPSULE | Freq: Three times a day (TID) | ORAL | 3 refills | Status: DC

## 2018-03-03 ENCOUNTER — Encounter (HOSPITAL_COMMUNITY): Payer: Self-pay | Admitting: Emergency Medicine

## 2018-03-03 ENCOUNTER — Other Ambulatory Visit: Payer: Self-pay

## 2018-03-03 ENCOUNTER — Emergency Department (HOSPITAL_COMMUNITY): Payer: 59

## 2018-03-03 ENCOUNTER — Emergency Department (HOSPITAL_COMMUNITY)
Admission: EM | Admit: 2018-03-03 | Discharge: 2018-03-03 | Disposition: A | Discharge status: Home / Self Care | Payer: 59 | Attending: Emergency Medicine | Admitting: Emergency Medicine

## 2018-03-03 DIAGNOSIS — Z79899 Other long term (current) drug therapy: Secondary | ICD-10-CM | POA: Insufficient documentation

## 2018-03-03 DIAGNOSIS — Z87891 Personal history of nicotine dependence: Secondary | ICD-10-CM | POA: Diagnosis not present

## 2018-03-03 DIAGNOSIS — R112 Nausea with vomiting, unspecified: Secondary | ICD-10-CM | POA: Diagnosis not present

## 2018-03-03 DIAGNOSIS — R1110 Vomiting, unspecified: Secondary | ICD-10-CM | POA: Diagnosis not present

## 2018-03-03 DIAGNOSIS — I10 Essential (primary) hypertension: Secondary | ICD-10-CM | POA: Insufficient documentation

## 2018-03-03 DIAGNOSIS — R197 Diarrhea, unspecified: Secondary | ICD-10-CM | POA: Diagnosis not present

## 2018-03-03 DIAGNOSIS — R1012 Left upper quadrant pain: Secondary | ICD-10-CM | POA: Diagnosis not present

## 2018-03-03 LAB — URINALYSIS, ROUTINE W REFLEX MICROSCOPIC
Bilirubin Urine: NEGATIVE
GLUCOSE, UA: NEGATIVE mg/dL
HGB URINE DIPSTICK: NEGATIVE
Ketones, ur: 20 mg/dL — AB
NITRITE: NEGATIVE
Protein, ur: NEGATIVE mg/dL
SPECIFIC GRAVITY, URINE: 1.027 (ref 1.005–1.030)
pH: 5 (ref 5.0–8.0)

## 2018-03-03 LAB — CBC
HCT: 43.2 % (ref 36.0–46.0)
Hemoglobin: 13.8 g/dL (ref 12.0–15.0)
MCH: 29.1 pg (ref 26.0–34.0)
MCHC: 31.9 g/dL (ref 30.0–36.0)
MCV: 90.9 fL (ref 80.0–100.0)
NRBC: 0 % (ref 0.0–0.2)
PLATELETS: 269 10*3/uL (ref 150–400)
RBC: 4.75 MIL/uL (ref 3.87–5.11)
RDW: 15.7 % — AB (ref 11.5–15.5)
WBC: 8.7 10*3/uL (ref 4.0–10.5)

## 2018-03-03 LAB — COMPREHENSIVE METABOLIC PANEL
ALT: 70 U/L — AB (ref 0–44)
AST: 56 U/L — AB (ref 15–41)
Albumin: 3.7 g/dL (ref 3.5–5.0)
Alkaline Phosphatase: 87 U/L (ref 38–126)
Anion gap: 12 (ref 5–15)
BILIRUBIN TOTAL: 0.5 mg/dL (ref 0.3–1.2)
BUN: 13 mg/dL (ref 6–20)
CO2: 26 mmol/L (ref 22–32)
CREATININE: 0.58 mg/dL (ref 0.44–1.00)
Calcium: 9.5 mg/dL (ref 8.9–10.3)
Chloride: 100 mmol/L (ref 98–111)
Glucose, Bld: 109 mg/dL — ABNORMAL HIGH (ref 70–99)
Potassium: 2.9 mmol/L — ABNORMAL LOW (ref 3.5–5.1)
Sodium: 138 mmol/L (ref 135–145)
TOTAL PROTEIN: 7.4 g/dL (ref 6.5–8.1)

## 2018-03-03 LAB — LIPASE, BLOOD: LIPASE: 28 U/L (ref 11–51)

## 2018-03-03 LAB — I-STAT BETA HCG BLOOD, ED (MC, WL, AP ONLY)

## 2018-03-03 MED ORDER — IOPAMIDOL (ISOVUE-300) INJECTION 61%
100.0000 mL | Freq: Once | INTRAVENOUS | Status: AC | PRN
  Administered 2018-03-03: 100 mL via INTRAVENOUS

## 2018-03-03 MED ORDER — LACTATED RINGERS IV BOLUS
1000.0000 mL | Freq: Once | INTRAVENOUS | Status: AC
  Administered 2018-03-03: 1000 mL via INTRAVENOUS

## 2018-03-03 MED ORDER — HYDROMORPHONE HCL 1 MG/ML IJ SOLN
1.0000 mg | Freq: Once | INTRAMUSCULAR | Status: AC
  Administered 2018-03-03: 1 mg via INTRAVENOUS
  Filled 2018-03-03: qty 1

## 2018-03-03 MED ORDER — METOCLOPRAMIDE HCL 5 MG/ML IJ SOLN
5.0000 mg | Freq: Once | INTRAMUSCULAR | Status: AC
  Administered 2018-03-03: 5 mg via INTRAVENOUS
  Filled 2018-03-03: qty 2

## 2018-03-03 MED ORDER — SODIUM CHLORIDE 0.9 % IJ SOLN
INTRAMUSCULAR | Status: AC
  Filled 2018-03-03: qty 50

## 2018-03-03 MED ORDER — POTASSIUM CHLORIDE CRYS ER 20 MEQ PO TBCR
40.0000 meq | EXTENDED_RELEASE_TABLET | Freq: Once | ORAL | Status: AC
  Administered 2018-03-03: 40 meq via ORAL
  Filled 2018-03-03: qty 2

## 2018-03-03 MED ORDER — ONDANSETRON HCL 4 MG/2ML IJ SOLN
4.0000 mg | Freq: Once | INTRAMUSCULAR | Status: AC | PRN
  Administered 2018-03-03: 4 mg via INTRAVENOUS
  Filled 2018-03-03: qty 2

## 2018-03-03 MED ORDER — IOPAMIDOL (ISOVUE-300) INJECTION 61%
INTRAVENOUS | Status: AC
  Filled 2018-03-03: qty 100

## 2018-03-03 NOTE — ED Notes (Signed)
ED Provider at bedside. 

## 2018-03-03 NOTE — ED Triage Notes (Signed)
Patient c/o N/V/D and headache since yesterday. Reports gastric bypass surgery on 9/10. Denies chest pain and SOB.

## 2018-03-03 NOTE — Discharge Instructions (Addendum)
Dawn Hayes,   The CT scan we did availability does not show that you have any obstruction or perforation.  For your nausea, I gave you Reglan here in the ED and the seems to have helped you.  I will continue the Zofran that was prescribed to you by your doctor.  For diet please start of with clear liquid diet and slowly advance as tolerated.

## 2018-03-03 NOTE — ED Provider Notes (Signed)
[facility] DEPT Provider Note   CSN: [id number] Arrival date & time: 03/03/18  1315     History   Chief Complaint Chief Complaint  Patient presents with  . Abdominal Pain  . Emesis    HPI Dawn Hayes is a 47 y.o. female with medical history significant for morbid obesity status post gastric bypass on 01/19/2018, psoriatic arthritis, GERD, fibromyalgia, migraine headaches presenting for an evaluation of nausea.  She reported that since her surgery she has had ongoing nausea however it worsened last night and says that she has had 2 episodes of nonbilious nonbloody emesis.  She also reported of 2 episodes of loose stools which has since subsided.  She denies fevers, chills, hematochezia, melena, chest pain, shortness of breath, lightheadedness or dizziness.  Past Medical History:  Diagnosis Date  . Anemia   . Arthritis    psoriatic arthritis  . Costochondritis   . Diverticulitis   . Dysmenorrhea   . Fibromyalgia   . GERD (gastroesophageal reflux disease)   . Headache    Migraines  . History of hiatal hernia   . Hypertension   . Menorrhagia   . Psoriatic arthritis ([place])   . Smoker     Patient Active Problem List   Diagnosis Date Noted  . Nausea 01/27/2018  . Bariatric surgery status 01/26/2018  . RLS (restless legs syndrome) 01/26/2018  . Morbid obesity ([place]) 01/19/2018  . Vitamin D deficiency 11/10/2017  . Fibromyalgia 11/10/2017  . Anxiety and depression 05/27/2016  . Status post total abdominal hysterectomy 04/08/2016  . Costochondritis 12/04/2015  . Anemia, iron deficiency 10/10/2015  . Tick bites 10/10/2015  . Fatigue 10/10/2015  . Allergic rhinitis 09/07/2015  . Hemorrhoid   . Diverticulosis of colon without hemorrhage   . Hiatal hernia   . Anemia 05/08/2015  . Rectal bleeding 05/08/2015  . Chest pain, rule out acute myocardial infarction 09/27/2013  . Abnormal finding on EKG 09/16/2013  . Psoriasis 09/16/2013  .  Psoriatic arthritis ([place]) 09/16/2013  . HTN (hypertension) 09/16/2013  . Morbid obesity, unspecified obesity type ([place]) 09/16/2013  . GERD 12/11/2009  . DIARRHEA, CHRONIC 12/11/2009    Past Surgical History:  Procedure Laterality Date  . ABDOMINAL HYSTERECTOMY    . CARPAL TUNNEL RELEASE Left 01/19/2015   Procedure: LEFT CARPAL TUNNEL RELEASE;  Surgeon: Roseanne Kaufman, MD;  Location: [facility];  Service: Orthopedics;  Laterality: Left;  . CHOLECYSTECTOMY    . CHOLECYSTECTOMY, LAPAROSCOPIC  2006  . COLONOSCOPY  1998   normal, [place]  . COLONOSCOPY N/A 05/30/2015   RMR: Minimal anal canal hemorrhoids. colonic diverticulosis. I suspect relatively trivial recent gI Bleed likely related to hemorrhoids. This finding alone would not likely adequatly explain her degree of anemia. she likely has a significent component from menstrual losses. , etc.   . COLONOSCOPY WITH PROPOFOL N/A 03/09/2017   Dr. Gala Romney: scattered diverticula, distal 5cm of TI normal. Grade II non-bleeding internal hemorrhoids. next TCS 10 years.   . CYSTOSCOPY N/A 04/08/2016   Procedure: CYSTOSCOPY;  Surgeon: Nunzio Cobbs, MD;  Location: [facility];  Service: Gynecology;  Laterality: N/A;  . DILATATION & CURETTAGE/HYSTEROSCOPY WITH MYOSURE N/A 01/04/2016   Procedure: DILATATION & CURETTAGE/HYSTEROSCOPY;  Surgeon: Nunzio Cobbs, MD;  Location: [facility];  Service: Gynecology;  Laterality: N/A;  . ESOPHAGOGASTRODUODENOSCOPY  09/11/99   tiny esophageal erosions with mild erosive reflux/no barrett's/normal stomach  . ESOPHAGOGASTRODUODENOSCOPY N/A 05/30/2015   RMR: Hiatal hernia otherwise  negative EGD  . GASTRIC ROUX-EN-Y N/A 01/19/2018   Procedure: LAPAROSCOPIC ROUX-EN-Y GASTRIC BYPASS WITH HIATAL HERNIA REPAIR, WITH UPPER ENDOSCOPY, ERAS Pathway;  Surgeon: Greer Pickerel, MD;  Location: [facility];  Service: General;  Laterality: N/A;  . GIVENS CAPSULE STUDY N/A 08/14/2017   Procedure: GIVENS CAPSULE STUDY;   Surgeon: Daneil Dolin, MD;  [facility];  Service: Endoscopy;  Laterality: N/A;  7:30am  . HYSTERECTOMY ABDOMINAL WITH SALPINGECTOMY Bilateral 04/08/2016   Procedure: HYSTERECTOMY ABDOMINAL WITH SALPINGECTOMY;  Surgeon: Nunzio Cobbs, MD;  Location: [facility];  Service: Gynecology;  Laterality: Bilateral;  . LAPAROSCOPIC BILATERAL SALPINGECTOMY N/A 04/08/2016   Procedure: LAPAROSCOPIC BILATERAL SALPINGECTOMY possible BSO;  Surgeon: Nunzio Cobbs, MD;  Location: [facility];  Service: Gynecology;  Laterality: N/A;  . LAPAROSCOPIC HYSTERECTOMY N/A 04/08/2016   Procedure: ATTEMPTED HYSTERECTOMY TOTAL LAPAROSCOPIC;  Surgeon: Nunzio Cobbs, MD;  Location: [facility];  Service: Gynecology;  Laterality: N/A;  . LAPAROSCOPIC LYSIS OF ADHESIONS  04/08/2016   Procedure: LAPAROSCOPIC LYSIS OF ADHESIONS;  Surgeon: Nunzio Cobbs, MD;  Location: [facility];  Service: Gynecology;;  . LAPAROSCOPIC LYSIS OF ADHESIONS N/A 01/19/2018   Procedure: LAPAROSCOPIC LYSIS OF ADHESIONS;  Surgeon: Greer Pickerel, MD;  Location: [facility];  Service: General;  Laterality: N/A;     OB History    Gravida  0   Para      Term      Preterm      AB      Living        SAB      TAB      Ectopic      Multiple      Live Births               Home Medications    Prior to Admission medications   Medication Sig Start Date End Date Taking? Authorizing Provider  ALPRAZolam (NIRAVAM) 0.5 MG dissolvable tablet Take 1 tablet before MRI and 1 tablet after MRI. 02/17/18   Midge Minium, MD  betamethasone dipropionate (DIPROLENE) 0.05 % cream Apply topically 2 (two) times daily. 02/04/18   Leamon Arnt, MD  diclofenac sodium (VOLTAREN) 1 % GEL Apply 2-4 g topically 4 (four) times daily as needed (for muscle/joint pain.).  09/25/17   [provider]  DULoxetine (CYMBALTA) 60 MG capsule Take 1 capsule (60 mg total) by mouth daily. 12/31/17   Midge Minium, MD    esomeprazole (NEXIUM) 40 MG capsule TAKE 1 CAPSULE(40 MG) BY MOUTH DAILY BEFORE BREAKFAST 09/26/17   Mahala Menghini, PA-C  gabapentin (NEURONTIN) 100 MG capsule Take 1 capsule (100 mg total) by mouth 3 (three) times daily. 03/02/18   Midge Minium, MD  hydrochlorothiazide (HYDRODIURIL) 25 MG tablet TAKE 1 TABLET(25 MG) BY MOUTH DAILY 02/02/18   Midge Minium, MD  HYDROcodone-acetaminophen (NORCO) 10-325 MG tablet Take 1 tablet by mouth 3 (three) times daily as needed (for pain).  12/14/17   [provider]  Menthol, Topical Analgesic, (BIOFREEZE EX) Apply 1 application topically 4 (four) times daily as needed (for muscle/joint pain.).    [provider]  ondansetron (ZOFRAN-ODT) 4 MG disintegrating tablet Take 1 tablet (4 mg total) by mouth every 6 (six) hours as needed for nausea or vomiting. 01/21/18   Greer Pickerel, MD  rOPINIRole (REQUIP) 0.5 MG tablet Take 1/2 tab nightly x3 days and then increase to 1 tab nightly 01/26/18  Midge Minium, MD  triamcinolone cream (KENALOG) 0.1 % Apply 1 application topically daily as needed (for irritation).  08/24/17   [provider]  ustekinumab (STELARA) 130 MG/26ML SOLN injection Inject 130 mg into the vein every 3 (three) months.     [provider]  XIIDRA 5 % SOLN Place 1 drop into both eyes daily as needed (dry/irritated eyes.).  10/10/16   [provider]    Family History Family History  Problem Relation Age of Onset  . Brain cancer Father   . Prostate cancer Father   . Hyperlipidemia Mother   . Colon cancer Maternal Grandmother 49  . Stroke Paternal Grandfather        COD  . Aneurysm Maternal Grandfather   . Cancer Other   . Hypertension Other   . Other Paternal Uncle        gastric polyps    Social History Social History   Tobacco Use  . Smoking status: Former Smoker    Packs/day: 0.10    Years: 20.00    Pack years: 2.00    Types: Cigarettes  . Smokeless tobacco: Never Used   . Tobacco comment: smoking 5 cigarettes a day, hasn't quit completely   Substance Use Topics  . Alcohol use: Yes    Alcohol/week: 16.0 standard drinks    Types: 6 Glasses of wine, 10 Standard drinks or equivalent per week    Comment: socially, weekends 4-5  . Drug use: No     Allergies   Aspirin; Toradol [ketorolac tromethamine]; and Adhesive [tape]   Review of Systems Review of Systems  Constitutional: Negative.   HENT: Negative.   Respiratory: Negative.   Cardiovascular: Negative.   Gastrointestinal: Positive for diarrhea, nausea and vomiting.  Endocrine: Negative.   Genitourinary: Negative.   Musculoskeletal: Negative.   Skin: Negative.   Neurological: Negative.   Psychiatric/Behavioral: Negative.      Physical Exam Updated Vital Signs BP 121/81   Pulse 83   Temp 98 F (36.7 C) (Oral)   Resp 19   LMP 02/28/2016   SpO2 97%   Physical Exam  Constitutional: She appears well-developed and well-nourished.  HENT:  Head: Normocephalic and atraumatic.  Cardiovascular: Normal rate and regular rhythm.  Pulmonary/Chest: Effort normal and breath sounds normal.  Abdominal: Normal appearance and bowel sounds are normal. There is tenderness in the left upper quadrant and left lower quadrant. There is no rigidity, no rebound and no guarding.  Neurological: She is alert.  Skin: Skin is warm.  Psychiatric: She has a normal mood and affect. Her behavior is normal.     ED Treatments / Results  Labs (all labs ordered are listed, but only abnormal results are displayed) Labs Reviewed  COMPREHENSIVE METABOLIC PANEL - Abnormal; Notable for the following components:      Result Value   Potassium 2.9 (*)    Glucose, Bld 109 (*)    AST 56 (*)    ALT 70 (*)    All other components within normal limits  CBC - Abnormal; Notable for the following components:   RDW 15.7 (*)    All other components within normal limits  URINALYSIS, ROUTINE W REFLEX MICROSCOPIC - Abnormal;  Notable for the following components:   Color, Urine AMBER (*)    APPearance HAZY (*)    Ketones, ur 20 (*)    Leukocytes, UA TRACE (*)    Bacteria, UA FEW (*)    All other components within normal limits  LIPASE,  BLOOD  I-STAT BETA HCG BLOOD, ED ([facility], [facility], [facility] ONLY)    EKG None  Radiology No results found.  Procedures Procedures (including critical care time)  Medications Ordered in ED Medications  lactated ringers bolus 1,000 mL (1,000 mLs Intravenous New Bag/Given 03/03/18 1543)  HYDROmorphone (DILAUDID) injection 1 mg (has no administration in time range)  ondansetron (ZOFRAN) injection 4 mg (4 mg Intravenous Given 03/03/18 1358)  potassium chloride SA (K-DUR,KLOR-CON) CR tablet 40 mEq (40 mEq Oral Given 03/03/18 1540)  metoCLOPramide (REGLAN) injection 5 mg (5 mg Intravenous Given 03/03/18 1540)     Initial Impression / Assessment and Plan / ED Course  I have reviewed the triage vital signs and the nursing notes.  Pertinent labs & imaging results that were available during my care of the patient were reviewed by me and considered in my medical decision making (see chart for details).   47 year old morbidly obese woman status post gastric bypass on 01/19/2018 presented with 1 day history of nausea, nonbilious nonbloody emesis.  Patient does report she has a chronic history of mixed intermittent diarrhea as well as constipation.  Denies fevers, chills, chest pain, shortness of breath.  Physical exams, she had tenderness at the left upper quadrant and left mid abdomen with no guarding, rigidity or rebound denies.  Labs were significant for hypokalemia of 2.9 which was repleted with K-Dur 40 mEq.   There is concern for small bowel obstruction given her recent abdominal surgery.  She was made n.p.o. and started on IV fluids. CT abdomen and pelvis with IV and oral contrast showed no acute findings in the abdomen/pelvis. No evidence of bowel obstruction.   Final Clinical  Impressions(s) / ED Diagnoses   Final diagnoses:  Non-intractable vomiting with nausea, unspecified vomiting type    ED Discharge Orders    None       Jean Rosenthal, MD 03/03/18 1654    Quintella Reichert, MD 03/06/18 [phone]

## 2018-03-08 DIAGNOSIS — M47816 Spondylosis without myelopathy or radiculopathy, lumbar region: Secondary | ICD-10-CM | POA: Diagnosis not present

## 2018-03-08 DIAGNOSIS — M797 Fibromyalgia: Secondary | ICD-10-CM | POA: Diagnosis not present

## 2018-03-08 DIAGNOSIS — G894 Chronic pain syndrome: Secondary | ICD-10-CM | POA: Diagnosis not present

## 2018-03-09 DIAGNOSIS — M2560 Stiffness of unspecified joint, not elsewhere classified: Secondary | ICD-10-CM | POA: Diagnosis not present

## 2018-03-09 DIAGNOSIS — M545 Low back pain: Secondary | ICD-10-CM | POA: Diagnosis not present

## 2018-03-09 DIAGNOSIS — M25551 Pain in right hip: Secondary | ICD-10-CM | POA: Diagnosis not present

## 2018-03-15 ENCOUNTER — Encounter: Payer: Self-pay | Admitting: Dietician

## 2018-03-15 ENCOUNTER — Encounter: Payer: 59 | Attending: General Surgery | Admitting: Dietician

## 2018-03-15 DIAGNOSIS — Z886 Allergy status to analgesic agent status: Secondary | ICD-10-CM | POA: Diagnosis not present

## 2018-03-15 DIAGNOSIS — Z6841 Body Mass Index (BMI) 40.0 and over, adult: Secondary | ICD-10-CM | POA: Insufficient documentation

## 2018-03-15 DIAGNOSIS — Z713 Dietary counseling and surveillance: Secondary | ICD-10-CM | POA: Diagnosis not present

## 2018-03-15 DIAGNOSIS — Z79899 Other long term (current) drug therapy: Secondary | ICD-10-CM | POA: Insufficient documentation

## 2018-03-15 NOTE — Progress Notes (Signed)
Bariatric Follow-Up Visit 8 Weeks Post-Operative RYGB Surgery Medical Nutrition Therapy  Appt Start Time: 3:30pm End Time: 4:05pm  Pt was seen on 03/15/2018 for Post-Operative Bariatric Surgery Nutrition Management.  Primary Concerns Today: Follow up to assess bariatric post-op diet progression and discuss next phase of diet. Pt has visited the ER dt nausea/vomiting and reports constipation.    NUTRITION ASSESSMENT  Anthropometrics  Start weight at [facility]: 273.5 lbs (Date: 05/27/2017) Today's weight: 225.6 lbs Weight change: -47.9 lbs  TANITA  Body Composition Results  02/02/2018 03/15/2018   BMI (kg/m2) 44.8 42.6   Fat Mass (lbs) 126.6 108   Fat Free Mass (lbs) 118.4 117.6   Total Body Water (lbs) 86.6 85.4   Clinical Medications: Cymbalta, Nexium, Neurontin, Hydrodiuril, Hydrocodone, Zofran-ODT, Phenergan, Requip, Stelara Supplements: bariatric multivitamin + calcium   24-Hr Dietary Recall First Meal: cottage cheese or yogurt Snack: water  Second Meal: protein shake or soup Snack: water  Third Meal: protein shake or soup  Snack: water Beverages: water, hot tea (unsweetened)   Food & Nutrition Related Hx Dietary Hx: Pt reports drinking 1 protein shake per day, either breakfast or lunch or dinner. Pt states she is able to tolerate cottage cheese, yogurt, and eggs, but otherwise is not chewing many protein foods. Pt states whey protein drinks cause upset stomach. Pt reports constipation, however may be dt medications. Pt states she was recently prescribed a stool softener to help with constipation. Pt states she would like to go back to the hydration clinic for help staying hydrated since she is not meeting her fluid goals orally.  Estimated Daily Fluid Intake: 50 oz Estimated Daily Protein Intake: 50 g  Physical Activity  Current average weekly physical activity: none currently, but working with physical therapist and plans to join the [facility] for swimming/walking on the treadmill.   Encouraged to engage in 150 minutes of moderate physical activity including cardiovascular and weight baring weekly.   Post-Op Goals Drinking while eating: no Chewing/swallowing difficulties: no Changes in vision: no Changes to mood/headaches: off and on headaches  Hair loss/changes to skin/nails: no  Difficulty focusing/concentrating: yes in the mornings  Sweating: no  Dizziness/lightheadedness: no  Palpitations: no  Carbonated/caffeinated beverages: no  N/V/D/C/Gas: yes- nausea, vomiting, constipation  Abdominal pain: yes Dumping syndrome: 1 episode   *Progress Towards Goals: Pt's tolerance of solid proteins has increased since ED visit a couple weeks ago.   NUTRITION DIAGNOSIS  Overweight/obesity ([place]-3.3) related to past poor dietary habits and physical inactivity as evidenced by patient w/ completed RYGB surgery following dietary guidelines for continued weight loss.   NUTRITION INTERVENTION Nutrition counseling (C-1) and education (E-2) to facilitate bariatric surgery goals, including: . Diet advancement to the next phase (phase 4) now including non-starchy vegetables  . The importance of consuming adequate calories as well as certain nutrients daily due to the body's need for essential vitamins, minerals, and fats . The importance of daily physical activity and to reach a goal of at least 150 minutes of moderate to vigorous physical activity weekly (or as directed by their physician) due to benefits such as increased musculature and improved lab values  Handouts Provided Include   Phase IV: Protein + Non-Starchy Vegetables  Learning Style & Readiness for Change Teaching method utilized: Visual & Auditory  Demonstrated degree of understanding via: Teach Back  Barriers to learning/adherence to lifestyle change: reported issues with N/V have interfered with pt meeting fluid and protein goals.   Since pt is not meeting fluid or  protein goals, pt encouraged to continue  prioritizing small portions of soft, moist, protein foods before adding non-starchy vegetables. RD encouraged pt to sip water more frequently throughout the day to meet fluid goal.    MONITORING & EVALUATION Dietary intake, weekly physical activity, body weight, and fluid intake.  Next Steps Patient is to follow-up in 4 months for 6 month post-op class.

## 2018-03-15 NOTE — Patient Instructions (Signed)
.   Continue to aim for a minimum of 64 fluid ounces 7 days a week with at least 30 ounces being plain water . Eat non-starchy vegetables 2 times a day 7 days a week . Start out with soft cooked vegetables today and tomorrow; if tolerated, begin to eat raw vegetables including salads . Per meal/snack, eat 3 ounces of protein first then start on non-starchy vegetables; once you understand how much of your meal leads to satisfaction (and not full) while still eating 3 ounces of protein and non-starchy vegetables, you can eat them in any order (figure out how much you can eat at a time to get enough but not too much)  . Continue to aim for 30 minutes of physical activity at least 5 times a week  

## 2018-03-16 ENCOUNTER — Other Ambulatory Visit: Payer: Self-pay | Admitting: General Surgery

## 2018-03-16 DIAGNOSIS — Z9884 Bariatric surgery status: Secondary | ICD-10-CM

## 2018-03-17 ENCOUNTER — Encounter: Payer: Self-pay | Admitting: Family Medicine

## 2018-03-17 MED ORDER — PREDNISONE 10 MG PO TABS: ORAL_TABLET | ORAL | 0 refills | Status: DC

## 2018-03-18 ENCOUNTER — Encounter (HOSPITAL_COMMUNITY): Payer: Self-pay

## 2018-03-22 ENCOUNTER — Ambulatory Visit: Payer: Self-pay | Admitting: Skilled Nursing Facility1

## 2018-03-22 ENCOUNTER — Ambulatory Visit (HOSPITAL_COMMUNITY)
Admission: RE | Admit: 2018-03-22 | Discharge: 2018-03-22 | Disposition: A | Discharge status: Home / Self Care | Payer: 59 | Source: Ambulatory Visit | Attending: General Surgery | Admitting: General Surgery

## 2018-03-22 DIAGNOSIS — E860 Dehydration: Secondary | ICD-10-CM | POA: Insufficient documentation

## 2018-03-22 MED ORDER — SODIUM CHLORIDE 0.9 % IV BOLUS
2000.0000 mL | Freq: Once | INTRAVENOUS | Status: AC
  Administered 2018-03-22: 2000 mL via INTRAVENOUS

## 2018-03-22 MED ORDER — SODIUM CHLORIDE 0.9 % IV SOLN: INTRAVENOUS | Status: DC

## 2018-03-22 MED ORDER — ONDANSETRON 4 MG PO TBDP: 4.0000 mg | ORAL_TABLET | ORAL | Status: DC | PRN

## 2018-03-22 MED ORDER — ONDANSETRON HCL 4 MG/2ML IJ SOLN: 4.0000 mg | INTRAMUSCULAR | Status: DC | PRN

## 2018-03-22 NOTE — Discharge Instructions (Signed)
Dehydration, Adult Dehydration is when there is not enough fluid or water in your body. This happens when you lose more fluids than you take in. Dehydration can range from mild to very bad. It should be treated right away to keep it from getting very bad. Symptoms of mild dehydration may include:  Thirst.  Dry lips.  Slightly dry mouth.  Dry, warm skin.  Dizziness. Symptoms of moderate dehydration may include:  Very dry mouth.  Muscle cramps.  Dark pee (urine). Pee may be the color of tea.  Your body making less pee.  Your eyes making fewer tears.  Heartbeat that is uneven or faster than normal (palpitations).  Headache.  Light-headedness, especially when you stand up from sitting.  Fainting (syncope). Symptoms of very bad dehydration may include:  Changes in skin, such as: ? Cold and clammy skin. ? Blotchy (mottled) or pale skin. ? Skin that does not quickly return to normal after being lightly pinched and let go (poor skin turgor).  Changes in body fluids, such as: ? Feeling very thirsty. ? Your eyes making fewer tears. ? Not sweating when body temperature is high, such as in hot weather. ? Your body making very little pee.  Changes in vital signs, such as: ? Weak pulse. ? Pulse that is more than 100 beats a minute when you are sitting still. ? Fast breathing. ? Low blood pressure.  Other changes, such as: ? Sunken eyes. ? Cold hands and feet. ? Confusion. ? Lack of energy (lethargy). ? Trouble waking up from sleep. ? Short-term weight loss. ? Unconsciousness. Follow these instructions at home:  If told by your doctor, drink an ORS: ? Make an ORS by using instructions on the package. ? Start by drinking small amounts, about  cup (120 mL) every 5-10 minutes. ? Slowly drink more until you have had the amount that your doctor said to have.  Drink enough clear fluid to keep your pee clear or pale yellow. If you were told to drink an ORS, finish the ORS  first, then start slowly drinking clear fluids. Drink fluids such as: ? Water. Do not drink only water by itself. Doing that can make the salt (sodium) level in your body get too low (hyponatremia). ? Ice chips. ? Fruit juice that you have added water to (diluted). ? Low-calorie sports drinks.  Avoid: ? Alcohol. ? Drinks that have a lot of sugar. These include high-calorie sports drinks, fruit juice that does not have water added, and soda. ? Caffeine. ? Foods that are greasy or have a lot of fat or sugar.  Take over-the-counter and prescription medicines only as told by your doctor.  Do not take salt tablets. Doing that can make the salt level in your body get too high (hypernatremia).  Eat foods that have minerals (electrolytes). Examples include bananas, oranges, potatoes, tomatoes, and spinach.  Keep all follow-up visits as told by your doctor. This is important. Contact a doctor if:  You have belly (abdominal) pain that: ? Gets worse. ? Stays in one area (localizes).  You have a rash.  You have a stiff neck.  You get angry or annoyed more easily than normal (irritability).  You are more sleepy than normal.  You have a harder time waking up than normal.  You feel: ? Weak. ? Dizzy. ? Very thirsty.  You have peed (urinated) only a small amount of very dark pee during 6-8 hours. Get help right away if:  You have symptoms of   very bad dehydration.  You cannot drink fluids without throwing up (vomiting).  Your symptoms get worse with treatment.  You have a fever.  You have a very bad headache.  You are throwing up or having watery poop (diarrhea) and it: ? Gets worse. ? Does not go away.  You have blood or something green (bile) in your throw-up.  You have blood in your poop (stool). This may cause poop to look black and tarry.  You have not peed in 6-8 hours.  You pass out (faint).  Your heart rate when you are sitting still is more than 100 beats a  minute.  You have trouble breathing. This information is not intended to replace advice given to you by your health care provider. Make sure you discuss any questions you have with your health care provider. Document Released: 02/22/2009 Document Revised: 11/16/2015 Document Reviewed: 06/22/2015 Elsevier Interactive Patient Education  2018 Elsevier Inc.  

## 2018-03-22 NOTE — Progress Notes (Signed)
PATIENT CARE CENTER NOTE  Diagnosis: Dehydration   Provider: Dr Redmond Pulling   Procedure: 2 liter fluid bolus   Note: Patient received 2 liter bolus of 0.9% Sodium Chloride. Tolerated bolus well with no adverse reaction. Vital signs stable. Discharge instructions given. Patient alert, oriented and ambulatory at discharge.

## 2018-03-24 ENCOUNTER — Telehealth: Payer: Self-pay | Admitting: Nurse Practitioner

## 2018-03-24 ENCOUNTER — Encounter: Payer: Self-pay | Admitting: Family Medicine

## 2018-03-24 DIAGNOSIS — J329 Chronic sinusitis, unspecified: Principal | ICD-10-CM

## 2018-03-24 DIAGNOSIS — J029 Acute pharyngitis, unspecified: Secondary | ICD-10-CM

## 2018-03-24 DIAGNOSIS — B9789 Other viral agents as the cause of diseases classified elsewhere: Secondary | ICD-10-CM

## 2018-03-24 MED ORDER — FLUTICASONE PROPIONATE 50 MCG/ACT NA SUSP: 2.0000 | Freq: Every day | NASAL | 6 refills | Status: DC

## 2018-03-24 MED ORDER — GABAPENTIN 100 MG PO CAPS: 200.0000 mg | ORAL_CAPSULE | Freq: Three times a day (TID) | ORAL | 3 refills | Status: DC

## 2018-03-24 NOTE — Progress Notes (Signed)
We are sorry that you are not feeling well.  Here is how we plan to help!  Based on what you have shared with me it looks like you have a condition called pharyngitis.  Pharyngitis is inflammation in the back of the throat which can cause a sore throat, scratchiness and sometimes difficulty swallowing.   Pharyngitis is typically caused by a respiratory virus and will just run its course.  Please keep in mind that your symptoms could last up to 10 days.  For throat pain, we recommend over the counter oral pain relief medications such as acetaminophen or aspirin, or anti-inflammatory medications such as ibuprofen or naproxen sodium.  Topical treatments such as oral throat lozenges or sprays may be used as needed.  Avoid close contact with loved ones, especially the very young and elderly.  Remember to wash your hands thoroughly throughout the day as this is the number one way tot prevent the spread of infection and wipe down door knobs and counters with disinfectant.Based on what you have shared with me it looks like you have sinusitis.  Sinusitis is inflammation and infection in the sinus cavities of the head.  Based on your presentation I believe you most likely have Acute Viral Sinusitis.This is an infection most likely caused by a virus. There is not specific treatment for viral sinusitis other than to help you with the symptoms until the infection runs its course.  You may use an oral decongestant such as Mucinex D or if you have glaucoma or high blood pressure use plain Mucinex. Saline nasal spray help and can safely be used as often as needed for congestion, I have prescribed: Fluticasone nasal spray two sprays in each nostril once a day  Some authorities believe that zinc sprays or the use of Echinacea may shorten the course of your symptoms.  Sinus infections are not as easily transmitted as other respiratory infection, however we still recommend that you avoid close contact with loved ones, especially  the very young and elderly.  Remember to wash your hands thoroughly throughout the day as this is the number one way to prevent the spread of infection!  Home Care: Only take medications as instructed by your medical team. Do not take these medications with alcohol. A steam or ultrasonic humidifier can help congestion.  You can place a towel over your head and breathe in the steam from hot water coming from a faucet. Avoid close contacts especially the very young and the elderly. Cover your mouth when you cough or sneeze. Always remember to wash your hands.  Get Help Right Away If: You develop worsening fever or sinus pain. You develop a severe head ache or visual changes. Your symptoms persist after you have completed your treatment plan.  Make sure you Understand these instructions. Will watch your condition. Will get help right away if you are not doing well or get worse    After careful review of your answers, I would not recommend and antibiotic for your condition.  Antibiotics should not be used to treat conditions that we suspect are caused by viruses like the virus that causes the common cold or flu.  Providers prescribe antibiotics to treat infections caused by bacteria. Antibiotics are very powerful in treating bacterial infections when they are used properly.  To maintain their effectiveness, they should be used only when necessary.  Overuse of antibiotics has resulted in the development of super bugs that are resistant to treatment!    Home Care: Only  take medications as instructed by your medical team. Do not drink alcohol while taking these medications. A steam or ultrasonic humidifier can help congestion.  You can place a towel over your head and breathe in the steam from hot water coming from a faucet. Avoid close contacts especially the very young and the elderly. Cover your mouth when you cough or sneeze. Always remember to wash your hands.  Get Help Right Away  If: You develop worsening fever or throat pain. You develop a severe head ache or visual changes. Your symptoms persist after you have completed your treatment plan.  Make sure you Understand these instructions. Will watch your condition. Will get help right away if you are not doing well or get worse.  Your e-visit answers were reviewed by a board certified advanced clinical practitioner to complete your personal care plan.  Depending on the condition, your plan could have included both over the counter or prescription medications.  If there is a problem please reply once you have received a response from your provider.  Your safety is important to [place].  If you have drug allergies check your prescription carefully.    You can use MyChart to ask questions about todays visit, request a non-urgent call back, or ask for a work or school excuse for 24 hours related to this e-Visit. If it has been greater than 24 hours you will need to follow up with your provider, or enter a new e-Visit to address those concerns.  You will get an e-mail in the next two days asking about your experience.  I hope that your e-visit has been valuable and will speed your recovery. Thank you for using e-visits.

## 2018-03-29 ENCOUNTER — Encounter: Payer: Self-pay | Admitting: Physician Assistant

## 2018-03-29 ENCOUNTER — Other Ambulatory Visit: Payer: Self-pay

## 2018-03-29 ENCOUNTER — Ambulatory Visit: Payer: 59 | Admitting: Physician Assistant

## 2018-03-29 VITALS — BP 120/80 | HR 95 | Temp 97.6°F | Resp 16 | Ht 62.0 in | Wt 222.0 lb

## 2018-03-29 DIAGNOSIS — J019 Acute sinusitis, unspecified: Secondary | ICD-10-CM

## 2018-03-29 DIAGNOSIS — Z111 Encounter for screening for respiratory tuberculosis: Secondary | ICD-10-CM | POA: Diagnosis not present

## 2018-03-29 DIAGNOSIS — B9689 Other specified bacterial agents as the cause of diseases classified elsewhere: Secondary | ICD-10-CM

## 2018-03-29 DIAGNOSIS — M797 Fibromyalgia: Secondary | ICD-10-CM

## 2018-03-29 MED ORDER — DOXYCYCLINE HYCLATE 100 MG PO CAPS: 100.0000 mg | ORAL_CAPSULE | Freq: Two times a day (BID) | ORAL | 0 refills | Status: DC

## 2018-03-29 NOTE — Progress Notes (Signed)
Acute Office Visit  Subjective:    Patient ID: Dawn Hayes, female    DOB: May 12, 1971, 47 y.o.   MRN: [id number]  Chief Complaint  Patient presents with  . Generalized Body Aches  . PPD Placement  . Facial Pain   HPI Patient is in today to discuss multiple issues.  (1) Patient requesting TB screen today. 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   (2) Patient with chronic pain secondary to fibromyalgia, noting increased pain in feet and legs. She has discussed this with her PCP who recommend she increase her gabapentin to 200 mg TID. Notes she has only increased nighttime dose as she is hesitant to increase daytime dose in case it makes her sleepy. Notes no excessive grogginess with taking the 200 mg in the evening.   (3) Patient also endorses 11 days of sinus pressure now with sinus pain, ear pressure, headache and maxillary sinus pain. Denies fever or chills. Has been using her Flonase with only some relief in symptoms. Denies recent travel or sick contact. Denies chest pain, tightness or SOB.  Past Medical History:  Diagnosis Date  . Anemia   . Arthritis    psoriatic arthritis  . Costochondritis   . Diverticulitis   . Dysmenorrhea   . Fibromyalgia   . GERD (gastroesophageal reflux disease)   . Headache    Migraines  . History of hiatal hernia   . Hypertension   . Menorrhagia   . Psoriatic arthritis ([place])   . Smoker     Past Surgical History:  Procedure Laterality Date  . ABDOMINAL HYSTERECTOMY    . CARPAL TUNNEL RELEASE Left 01/19/2015   Procedure: LEFT CARPAL TUNNEL RELEASE;  Surgeon: Roseanne Kaufman, MD;  Location: [facility];  Service: Orthopedics;  Laterality: Left;  . CHOLECYSTECTOMY    . CHOLECYSTECTOMY, LAPAROSCOPIC  2006  . COLONOSCOPY  1998   normal, [place]  . COLONOSCOPY N/A 05/30/2015   RMR: Minimal anal canal hemorrhoids. colonic diverticulosis. I suspect relatively trivial recent gI Bleed likely related to  hemorrhoids. This finding alone would not likely adequatly explain her degree of anemia. she likely has a significent component from menstrual losses. , etc.   . COLONOSCOPY WITH PROPOFOL N/A 03/09/2017   Dr. Gala Romney: scattered diverticula, distal 5cm of TI normal. Grade II non-bleeding internal hemorrhoids. next TCS 10 years.   . CYSTOSCOPY N/A 04/08/2016   Procedure: CYSTOSCOPY;  Surgeon: Nunzio Cobbs, MD;  Location: [facility];  Service: Gynecology;  Laterality: N/A;  . DILATATION & CURETTAGE/HYSTEROSCOPY WITH MYOSURE N/A 01/04/2016   Procedure: DILATATION & CURETTAGE/HYSTEROSCOPY;  Surgeon: Nunzio Cobbs, MD;  Location: [facility];  Service: Gynecology;  Laterality: N/A;  . ESOPHAGOGASTRODUODENOSCOPY  09/11/99   tiny esophageal erosions with mild erosive reflux/no barrett's/normal stomach  . ESOPHAGOGASTRODUODENOSCOPY N/A 05/30/2015   RMR: Hiatal hernia otherwise negative EGD  . GASTRIC ROUX-EN-Y N/A 01/19/2018   Procedure: LAPAROSCOPIC ROUX-EN-Y GASTRIC BYPASS WITH HIATAL HERNIA REPAIR, WITH UPPER ENDOSCOPY, ERAS Pathway;  Surgeon: Greer Pickerel, MD;  Location: [facility];  Service: General;  Laterality: N/A;  . GIVENS CAPSULE STUDY N/A 08/14/2017   Procedure: GIVENS CAPSULE STUDY;  Surgeon: Daneil Dolin, MD;  [facility];  Service: Endoscopy;  Laterality: N/A;  7:30am  . HYSTERECTOMY ABDOMINAL WITH SALPINGECTOMY Bilateral 04/08/2016   Procedure: HYSTERECTOMY ABDOMINAL WITH SALPINGECTOMY;  Surgeon: Nunzio Cobbs, MD;  Location: [facility];  Service: Gynecology;  Laterality: Bilateral;  .  LAPAROSCOPIC BILATERAL SALPINGECTOMY N/A 04/08/2016   Procedure: LAPAROSCOPIC BILATERAL SALPINGECTOMY possible BSO;  Surgeon: Nunzio Cobbs, MD;  Location: [facility];  Service: Gynecology;  Laterality: N/A;  . LAPAROSCOPIC HYSTERECTOMY N/A 04/08/2016   Procedure: ATTEMPTED HYSTERECTOMY TOTAL LAPAROSCOPIC;  Surgeon: Nunzio Cobbs, MD;  Location: [facility];  Service:  Gynecology;  Laterality: N/A;  . LAPAROSCOPIC LYSIS OF ADHESIONS  04/08/2016   Procedure: LAPAROSCOPIC LYSIS OF ADHESIONS;  Surgeon: Nunzio Cobbs, MD;  Location: [facility];  Service: Gynecology;;  . LAPAROSCOPIC LYSIS OF ADHESIONS N/A 01/19/2018   Procedure: LAPAROSCOPIC LYSIS OF ADHESIONS;  Surgeon: Greer Pickerel, MD;  Location: [facility];  Service: General;  Laterality: N/A;    Family History  Problem Relation Age of Onset  . Brain cancer Father   . Prostate cancer Father   . Hyperlipidemia Mother   . Colon cancer Maternal Grandmother 57  . Stroke Paternal Grandfather        COD  . Aneurysm Maternal Grandfather   . Cancer Other   . Hypertension Other   . Other Paternal Uncle        gastric polyps    Social History   Socioeconomic History  . Marital status: Married    Spouse name: Not on file  . Number of children: 0  . Years of education: Not on file  . Highest education level: Not on file  Occupational History  . Occupation: [employer] attorney, [employer]  Social Needs  . Financial resource strain: Not on file  . Food insecurity:    Worry: Never true    Inability: Never true  . Transportation needs:    Medical: Not on file    Non-medical: Not on file  Tobacco Use  . Smoking status: Former Smoker    Packs/day: 0.10    Years: 20.00    Pack years: 2.00    Types: Cigarettes  . Smokeless tobacco: Never Used  . Tobacco comment: stopped smoking 6 months ago   Substance and Sexual Activity  . Alcohol use: Yes    Alcohol/week: 16.0 standard drinks    Types: 6 Glasses of wine, 10 Standard drinks or equivalent per week    Comment: socially, weekends 4-5  . Drug use: No  . Sexual activity: Yes    Partners: Male    Birth control/protection: None  Lifestyle  . Physical activity:    Days per week: Not on file    Minutes per session: Not on file  . Stress: Not on file  Relationships  . Social connections:    Talks on phone: Not on file    Gets together: Not on file     Attends religious service: Not on file    Active member of club or organization: Not on file    Attends meetings of clubs or organizations: Not on file    Relationship status: Not on file  . Intimate partner violence:    Fear of current or ex partner: Not on file    Emotionally abused: Not on file    Physically abused: Not on file    Forced sexual activity: Not on file  Other Topics Concern  . Not on file  Social History Narrative  . Not on file    Outpatient Medications Prior to Visit  Medication Sig Dispense Refill  . ALPRAZolam (NIRAVAM) 0.5 MG dissolvable tablet Take 1 tablet before MRI and 1 tablet after MRI. 2 tablet 0  . betamethasone dipropionate (DIPROLENE)  0.05 % cream Apply topically 2 (two) times daily. 45 g 0  . calcium carbonate (TUMS EX) 750 MG chewable tablet Chew 1 tablet by mouth 3 (three) times daily.    . diclofenac sodium (VOLTAREN) 1 % GEL Apply 2-4 g topically 4 (four) times daily as needed (for muscle/joint pain.).   0  . esomeprazole (NEXIUM) 40 MG capsule TAKE 1 CAPSULE(40 MG) BY MOUTH DAILY BEFORE BREAKFAST (Patient taking differently: Take 40 mg by mouth daily. ) 30 capsule 11  . fluticasone (FLONASE) 50 MCG/ACT nasal spray Place 2 sprays into both nostrils daily. 16 g 6  . gabapentin (NEURONTIN) 100 MG capsule Take 2 capsules (200 mg total) by mouth 3 (three) times daily. 180 capsule 3  . hydrochlorothiazide (HYDRODIURIL) 25 MG tablet TAKE 1 TABLET(25 MG) BY MOUTH DAILY (Patient taking differently: Take 25 mg by mouth daily. ) 30 tablet 3  . HYDROcodone-acetaminophen (NORCO) 10-325 MG tablet Take 1 tablet by mouth 3 (three) times daily as needed (for pain).   0  . ondansetron (ZOFRAN-ODT) 4 MG disintegrating tablet Take 1 tablet (4 mg total) by mouth every 6 (six) hours as needed for nausea or vomiting. 20 tablet 0  . OVER THE COUNTER MEDICATION Take 1 tablet by mouth daily. Bariatric Vitamin    . promethazine (PHENERGAN) 12.5 MG tablet Take 12.5 mg by  mouth every 8 (eight) hours as needed for nausea.  0  . ustekinumab (STELARA) 130 MG/26ML SOLN injection Inject 130 mg into the vein every 3 (three) months.     Marland Kitchen XIIDRA 5 % SOLN Place 1 drop into both eyes daily as needed (dry/irritated eyes.).   12  . DULoxetine (CYMBALTA) 60 MG capsule Take 1 capsule (60 mg total) by mouth daily. 30 capsule 3  . predniSONE (DELTASONE) 10 MG tablet 3 tabs x3 days and then 2 tabs x3 days and then 1 tab x3 days.  Take w/ food. 18 tablet 0  . rOPINIRole (REQUIP) 0.5 MG tablet Take 1/2 tab nightly x3 days and then increase to 1 tab nightly 30 tablet 3   No facility-administered medications prior to visit.     Allergies  Allergen Reactions  . Aspirin Other (See Comments)    Convulsions   . Toradol [Ketorolac Tromethamine] Nausea And Vomiting  . Adhesive [Tape] Itching and Rash   ROS Pertinent ORS are listed in the HPI.    Objective:    Physical Exam  Constitutional: She appears well-developed and well-nourished.  HENT:  Head: Normocephalic and atraumatic.  Right Ear: Tympanic membrane and external ear normal.  Left Ear: Tympanic membrane and external ear normal.  Nose: Mucosal edema and rhinorrhea present. Right sinus exhibits maxillary sinus tenderness and frontal sinus tenderness. Left sinus exhibits frontal sinus tenderness.  Mouth/Throat: Oropharynx is clear and moist.  Eyes: Conjunctivae are normal.  Neck: Neck supple.  Cardiovascular: Normal rate, normal heart sounds and intact distal pulses.  Lymphadenopathy:    She has no cervical adenopathy.  Vitals reviewed.   BP 120/80   Pulse 95   Temp 97.6 F (36.4 C) (Oral)   Resp 16   Ht 5\' 2"  (1.575 m)   Wt 222 lb (100.7 kg)   LMP 02/28/2016   SpO2 99%   BMI 40.60 kg/m  Wt Readings from Last 3 Encounters:  03/29/18 222 lb (100.7 kg)  03/15/18 225 lb 9.6 oz (102.3 kg)  02/04/18 244 lb 9.6 oz (110.9 kg)    There are no preventive care reminders to display  for this patient.  There  are no preventive care reminders to display for this patient.   Lab Results  Component Value Date   TSH 0.40 09/10/2017   Lab Results  Component Value Date   WBC 8.7 03/03/2018   HGB 13.8 03/03/2018   HCT 43.2 03/03/2018   MCV 90.9 03/03/2018   PLT 269 03/03/2018   Lab Results  Component Value Date   NA 138 03/03/2018   K 2.9 (L) 03/03/2018   CO2 26 03/03/2018   GLUCOSE 109 (H) 03/03/2018   BUN 13 03/03/2018   CREATININE 0.58 03/03/2018   BILITOT 0.5 03/03/2018   ALKPHOS 87 03/03/2018   AST 56 (H) 03/03/2018   ALT 70 (H) 03/03/2018   PROT 7.4 03/03/2018   ALBUMIN 3.7 03/03/2018   CALCIUM 9.5 03/03/2018   ANIONGAP 12 03/03/2018   GFR 84.17 09/10/2017   Lab Results  Component Value Date   CHOL 258 (H) 09/10/2017   Lab Results  Component Value Date   HDL 77.40 09/10/2017   Lab Results  Component Value Date   LDLCALC 146 (H) 09/10/2017   Lab Results  Component Value Date   TRIG 170.0 (H) 09/10/2017   Lab Results  Component Value Date   CHOLHDL 3 09/10/2017   Lab Results  Component Value Date   HGBA1C 6.2 09/11/2017       Assessment & Plan:   1. Screening-pulmonary TB TB gold test to be drawn today. - QuantiFERON-TB Gold Plus  2. Acute bacterial sinusitis Rx Doxycycline.  Increase fluids.  Rest.  Saline nasal spray.  Probiotic.  Mucinex as directed.  Humidifier in bedroom.  Call or return to clinic if symptoms are not improving.   3. Fibromyalgia Increase Gabapentin to 100 mg AM, 100 mg PM and 300 mg QHS, as she has not been taking any during the day. Close follow-up scheduled with PCP to further titrate medication.   No orders of the defined types were placed in this encounter.    Leeanne Rio, PA-C

## 2018-03-29 NOTE — Patient Instructions (Signed)
Please go to the lab today for blood work.  I will call you with your results. We will alter treatment regimen(s) if indicated by your results.   Take the Gabapentin as follows for a trial period -- 100 mg each morning; 100 mg each day around noon-1 pm; 300 mg each night. Follow-up with Dr. Birdie Riddle in 10-14 days to adjust further.   Please take antibiotic as directed.  Increase fluid intake.  Use Saline nasal spray.  Take a daily multivitamin.  Place a humidifier in the bedroom.  Please call or return clinic if symptoms are not improving.  Sinusitis Sinusitis is redness, soreness, and swelling (inflammation) of the paranasal sinuses. Paranasal sinuses are air pockets within the bones of your face (beneath the eyes, the middle of the forehead, or above the eyes). In healthy paranasal sinuses, mucus is able to drain out, and air is able to circulate through them by way of your nose. However, when your paranasal sinuses are inflamed, mucus and air can become trapped. This can allow bacteria and other germs to grow and cause infection. Sinusitis can develop quickly and last only a short time (acute) or continue over a long period (chronic). Sinusitis that lasts for more than 12 weeks is considered chronic.  CAUSES  Causes of sinusitis include:  Allergies.  Structural abnormalities, such as displacement of the cartilage that separates your nostrils (deviated septum), which can decrease the air flow through your nose and sinuses and affect sinus drainage.  Functional abnormalities, such as when the small hairs (cilia) that line your sinuses and help remove mucus do not work properly or are not present. SYMPTOMS  Symptoms of acute and chronic sinusitis are the same. The primary symptoms are pain and pressure around the affected sinuses. Other symptoms include:  Upper toothache.  Earache.  Headache.  Bad breath.  Decreased sense of smell and taste.  A cough, which worsens when you are lying  flat.  Fatigue.  Fever.  Thick drainage from your nose, which often is green and may contain pus (purulent).  Swelling and warmth over the affected sinuses. DIAGNOSIS  Your caregiver will perform a physical exam. During the exam, your caregiver may:  Look in your nose for signs of abnormal growths in your nostrils (nasal polyps).  Tap over the affected sinus to check for signs of infection.  View the inside of your sinuses (endoscopy) with a special imaging device with a light attached (endoscope), which is inserted into your sinuses. If your caregiver suspects that you have chronic sinusitis, one or more of the following tests may be recommended:  Allergy tests.  Nasal culture A sample of mucus is taken from your nose and sent to a lab and screened for bacteria.  Nasal cytology A sample of mucus is taken from your nose and examined by your caregiver to determine if your sinusitis is related to an allergy. TREATMENT  Most cases of acute sinusitis are related to a viral infection and will resolve on their own within 10 days. Sometimes medicines are prescribed to help relieve symptoms (pain medicine, decongestants, nasal steroid sprays, or saline sprays).  However, for sinusitis related to a bacterial infection, your caregiver will prescribe antibiotic medicines. These are medicines that will help kill the bacteria causing the infection.  Rarely, sinusitis is caused by a fungal infection. In theses cases, your caregiver will prescribe antifungal medicine. For some cases of chronic sinusitis, surgery is needed. Generally, these are cases in which sinusitis recurs more than  3 times per year, despite other treatments. HOME CARE INSTRUCTIONS   Drink plenty of water. Water helps thin the mucus so your sinuses can drain more easily.  Use a humidifier.  Inhale steam 3 to 4 times a day (for example, sit in the bathroom with the shower running).  Apply a warm, moist washcloth to your face 3  to 4 times a day, or as directed by your caregiver.  Use saline nasal sprays to help moisten and clean your sinuses.  Take over-the-counter or prescription medicines for pain, discomfort, or fever only as directed by your caregiver. SEEK IMMEDIATE MEDICAL CARE IF:  You have increasing pain or severe headaches.  You have nausea, vomiting, or drowsiness.  You have swelling around your face.  You have vision problems.  You have a stiff neck.  You have difficulty breathing. MAKE SURE YOU:   Understand these instructions.  Will watch your condition.  Will get help right away if you are not doing well or get worse. Document Released: 04/28/2005 Document Revised: 07/21/2011 Document Reviewed: 05/13/2011 [facility] Patient Information 2014 [place], [place].

## 2018-03-31 LAB — QUANTIFERON-TB GOLD PLUS
NIL: 0.02 IU/mL
QuantiFERON-TB Gold Plus: NEGATIVE
TB1-NIL: 0 [IU]/mL
TB2-NIL: <0 IU/mL

## 2018-04-05 DIAGNOSIS — M797 Fibromyalgia: Secondary | ICD-10-CM | POA: Diagnosis not present

## 2018-04-05 DIAGNOSIS — Z79899 Other long term (current) drug therapy: Secondary | ICD-10-CM | POA: Diagnosis not present

## 2018-04-05 DIAGNOSIS — M47816 Spondylosis without myelopathy or radiculopathy, lumbar region: Secondary | ICD-10-CM | POA: Diagnosis not present

## 2018-04-05 DIAGNOSIS — Z79891 Long term (current) use of opiate analgesic: Secondary | ICD-10-CM | POA: Diagnosis not present

## 2018-04-05 DIAGNOSIS — G894 Chronic pain syndrome: Secondary | ICD-10-CM | POA: Diagnosis not present

## 2018-04-07 DIAGNOSIS — L918 Other hypertrophic disorders of the skin: Secondary | ICD-10-CM | POA: Diagnosis not present

## 2018-04-07 DIAGNOSIS — L818 Other specified disorders of pigmentation: Secondary | ICD-10-CM | POA: Diagnosis not present

## 2018-04-07 DIAGNOSIS — L089 Local infection of the skin and subcutaneous tissue, unspecified: Secondary | ICD-10-CM | POA: Diagnosis not present

## 2018-04-13 ENCOUNTER — Encounter: Payer: Self-pay | Admitting: Family Medicine

## 2018-04-28 ENCOUNTER — Ambulatory Visit: Payer: 59 | Admitting: Family Medicine

## 2018-04-28 ENCOUNTER — Other Ambulatory Visit: Payer: Self-pay

## 2018-04-28 ENCOUNTER — Encounter: Payer: Self-pay | Admitting: Family Medicine

## 2018-04-28 ENCOUNTER — Encounter

## 2018-04-28 VITALS — BP 122/80 | HR 64 | Temp 98.2°F | Resp 16 | Ht 62.0 in | Wt 212.2 lb

## 2018-04-28 DIAGNOSIS — D509 Iron deficiency anemia, unspecified: Secondary | ICD-10-CM

## 2018-04-28 DIAGNOSIS — J069 Acute upper respiratory infection, unspecified: Secondary | ICD-10-CM

## 2018-04-28 DIAGNOSIS — M797 Fibromyalgia: Secondary | ICD-10-CM

## 2018-04-28 DIAGNOSIS — E559 Vitamin D deficiency, unspecified: Secondary | ICD-10-CM

## 2018-04-28 DIAGNOSIS — R5383 Other fatigue: Secondary | ICD-10-CM | POA: Diagnosis not present

## 2018-04-28 LAB — CBC WITH DIFFERENTIAL/PLATELET
BASOS PCT: 0.7 % (ref 0.0–3.0)
Basophils Absolute: 0 10*3/uL (ref 0.0–0.1)
EOS PCT: 1.6 % (ref 0.0–5.0)
Eosinophils Absolute: 0.1 10*3/uL (ref 0.0–0.7)
HEMATOCRIT: 40.8 % (ref 36.0–46.0)
Hemoglobin: 13.4 g/dL (ref 12.0–15.0)
Lymphocytes Relative: 30.7 % (ref 12.0–46.0)
Lymphs Abs: 2.2 10*3/uL (ref 0.7–4.0)
MCHC: 32.8 g/dL (ref 30.0–36.0)
MCV: 90.5 fl (ref 78.0–100.0)
MONO ABS: 0.5 10*3/uL (ref 0.1–1.0)
Monocytes Relative: 7.2 % (ref 3.0–12.0)
NEUTROS ABS: 4.2 10*3/uL (ref 1.4–7.7)
Neutrophils Relative %: 59.8 % (ref 43.0–77.0)
PLATELETS: 281 10*3/uL (ref 150.0–400.0)
RBC: 4.51 Mil/uL (ref 3.87–5.11)
RDW: 16.7 % — AB (ref 11.5–15.5)
WBC: 7 10*3/uL (ref 4.0–10.5)

## 2018-04-28 LAB — BASIC METABOLIC PANEL
BUN: 15 mg/dL (ref 6–23)
CHLORIDE: 104 meq/L (ref 96–112)
CO2: 28 meq/L (ref 19–32)
CREATININE: 0.53 mg/dL (ref 0.40–1.20)
Calcium: 9.6 mg/dL (ref 8.4–10.5)
GFR: 131.12 mL/min (ref >60.00)
GLUCOSE: 89 mg/dL (ref 70–99)
Potassium: 3.8 mEq/L (ref 3.5–5.1)
Sodium: 140 mEq/L (ref 135–145)

## 2018-04-28 LAB — TSH: TSH: 1.03 u[IU]/mL (ref 0.35–4.50)

## 2018-04-28 LAB — VITAMIN D 25 HYDROXY (VIT D DEFICIENCY, FRACTURES): VITD: 40.43 ng/mL (ref 30.00–100.00)

## 2018-04-28 NOTE — Progress Notes (Signed)
   Subjective:    Patient ID: Dawn Hayes, female    DOB: March 31, 1971, 47 y.o.   MRN: [id number]  HPI URI- 'I think my sinus infxn in back'.  Laryngitis started Saturday- this is improving.  + thick, yellow nasal drainage.  + sore throat.  Mild cough- worse at night.  No fevers.  + dizziness w/ bending forward.  + HAs daily x4 days.  Had sinus infxn last month and was tx'd w/ Doxy.  Fatigue- this predated recent URI.  She has hx of iron deficiency anemia that required iron infusions.  Pain- pt has hx of Fibro.  Having a hard time taking her Gabapentin during the day b/c of fatigue.     Review of Systems For ROS see HPI     Objective:   Physical Exam Vitals signs reviewed.  Constitutional:      General: She is not in acute distress.    Appearance: Normal appearance. She is well-developed. She is obese.  HENT:     Head: Normocephalic and atraumatic.     Right Ear: Tympanic membrane normal.     Left Ear: Tympanic membrane normal.     Nose: Mucosal edema and rhinorrhea present.     Right Sinus: No maxillary sinus tenderness or frontal sinus tenderness.     Left Sinus: No maxillary sinus tenderness or frontal sinus tenderness.     Mouth/Throat:     Pharynx: Posterior oropharyngeal erythema (w/ PND) present.  Eyes:     Conjunctiva/sclera: Conjunctivae normal.     Pupils: Pupils are equal, round, and reactive to light.  Neck:     Musculoskeletal: Normal range of motion and neck supple.  Cardiovascular:     Rate and Rhythm: Normal rate and regular rhythm.     Heart sounds: Normal heart sounds.  Pulmonary:     Effort: Pulmonary effort is normal. No respiratory distress.     Breath sounds: Normal breath sounds. No wheezing or rales.  Lymphadenopathy:     Cervical: No cervical adenopathy.  Skin:    General: Skin is warm and dry.     Coloration: Skin is not pale.  Neurological:     General: No focal deficit present.     Mental Status: She is alert and oriented to person, place,  and time.  Psychiatric:        Mood and Affect: Mood normal.        Behavior: Behavior normal.        Thought Content: Thought content normal.           Assessment & Plan:  Viral URI- no evidence of bacterial infxn.  No need for abx.  Discussed viral/allergy combo.  Reviewed supportive care and red flags that should prompt return.  Pt expressed understanding and is in agreement w/ plan.

## 2018-04-28 NOTE — Patient Instructions (Signed)
Follow up as needed or as scheduled We'll notify you of your lab results and make any changes if needed Your symptoms are consistent w/ a viral/allergy combo and do not require antibiotics but would benefit from a daily Claritin or Zyrtec Drink plenty of fluids REST! Increase the Gabapentin to 200-300mg  nightly for pain relief.  You can also take 1 when you return home from work Call with any questions or concerns Happy Holidays!!!

## 2018-04-29 ENCOUNTER — Other Ambulatory Visit: Payer: Self-pay | Admitting: Family Medicine

## 2018-04-29 LAB — IRON,TIBC AND FERRITIN PANEL
%SAT: 28 % (ref 16–45)
Ferritin: 83 ng/mL (ref 16–232)
Iron: 78 ug/dL (ref 40–190)
TIBC: 278 ug/dL (ref 250–450)

## 2018-04-29 NOTE — Assessment & Plan Note (Signed)
Pt has hx of this.  Check labs and replete prn. 

## 2018-04-29 NOTE — Assessment & Plan Note (Signed)
Recurrent problem for pt.  Suspect this is fibro related but will check labs to r/o metabolic cause and treat abnormalities if present.

## 2018-04-29 NOTE — Assessment & Plan Note (Signed)
Ongoing issue for pt.  Encouraged her to increase gabapentin dose at night and to skip AM dose due to fatigue.  She should take dose when she gets home from work to help w/ pain.  Pt expressed understanding and is in agreement w/ plan.

## 2018-04-29 NOTE — Assessment & Plan Note (Signed)
Pt has hx of this.  Given her fatigue, check labs and determine if iron infusions are again needed.

## 2018-04-29 NOTE — Telephone Encounter (Signed)
This is not longer on her med list. Ok to fill?

## 2018-05-03 ENCOUNTER — Telehealth: Payer: Self-pay | Admitting: General Practice

## 2018-05-03 ENCOUNTER — Encounter: Payer: Self-pay | Admitting: Family Medicine

## 2018-05-03 DIAGNOSIS — Z79899 Other long term (current) drug therapy: Secondary | ICD-10-CM | POA: Diagnosis not present

## 2018-05-03 DIAGNOSIS — L4050 Arthropathic psoriasis, unspecified: Secondary | ICD-10-CM | POA: Diagnosis not present

## 2018-05-03 NOTE — Telephone Encounter (Signed)
Labs faxed

## 2018-05-03 NOTE — Telephone Encounter (Signed)
Copied from [place] [id number]. Topic: General - Other >> May 03, 2018 12:00 PM Virl Axe D wrote: Reason for CRM: Radonna Ricker with [facility] called and state they need the results of pt's TB test from 03/29/18 in order to have rx filled. Please advise. FAX# [phone] Kandy Garrison or Amy

## 2018-05-06 DIAGNOSIS — G894 Chronic pain syndrome: Secondary | ICD-10-CM | POA: Diagnosis not present

## 2018-05-06 DIAGNOSIS — M47816 Spondylosis without myelopathy or radiculopathy, lumbar region: Secondary | ICD-10-CM | POA: Diagnosis not present

## 2018-05-06 DIAGNOSIS — M797 Fibromyalgia: Secondary | ICD-10-CM | POA: Diagnosis not present

## 2018-05-06 MED ORDER — GABAPENTIN 600 MG PO TABS: 600.0000 mg | ORAL_TABLET | Freq: Every day | ORAL | 3 refills | Status: DC

## 2018-05-11 ENCOUNTER — Ambulatory Visit: Payer: Self-pay

## 2018-05-11 NOTE — Telephone Encounter (Signed)
  Pt calling for Prednisone pack for costochondritis. Called PCP office and Dr Birdie Riddle informed of request. Dr Birdie Riddle wants pt to go to [facility].  Informed pt of above. Pt asked why and stated that Dr Birdie Riddle wants her to be evaluated first. Pt then hung up.   Reason for Disposition . Caller requesting a NON-URGENT new prescription or refill and triager unable to refill per unit policy  Answer Assessment - Initial Assessment Questions 1. REASON FOR CALL or QUESTION: "What is your reason for calling today?" or "How can I best help you?" or "What question do you have that I can help answer?"     Pt calling for Prednisone pack for costochondritis. Called PCP office and Dr Birdie Riddle informed of request. Dr Birdie Riddle wants pt to go to [facility].  Informed pt of above. Pt asked why and stated that Dr Birdie Riddle wants her to be evaluated first. Pt then hung up.  Protocols used: MEDICATION QUESTION CALL-A-AH, INFORMATION ONLY CALL-A-AH

## 2018-05-12 ENCOUNTER — Emergency Department (HOSPITAL_COMMUNITY)
Admission: EM | Admit: 2018-05-12 | Discharge: 2018-05-12 | Disposition: A | Discharge status: Home / Self Care | Payer: 59 | Attending: Emergency Medicine | Admitting: Emergency Medicine

## 2018-05-12 ENCOUNTER — Encounter (HOSPITAL_COMMUNITY): Payer: Self-pay | Admitting: Emergency Medicine

## 2018-05-12 ENCOUNTER — Other Ambulatory Visit: Payer: Self-pay

## 2018-05-12 ENCOUNTER — Emergency Department (HOSPITAL_COMMUNITY): Payer: 59

## 2018-05-12 DIAGNOSIS — I10 Essential (primary) hypertension: Secondary | ICD-10-CM | POA: Diagnosis not present

## 2018-05-12 DIAGNOSIS — M549 Dorsalgia, unspecified: Secondary | ICD-10-CM | POA: Diagnosis not present

## 2018-05-12 DIAGNOSIS — Z79899 Other long term (current) drug therapy: Secondary | ICD-10-CM | POA: Insufficient documentation

## 2018-05-12 DIAGNOSIS — R0789 Other chest pain: Secondary | ICD-10-CM

## 2018-05-12 DIAGNOSIS — Z87891 Personal history of nicotine dependence: Secondary | ICD-10-CM | POA: Diagnosis not present

## 2018-05-12 DIAGNOSIS — R079 Chest pain, unspecified: Secondary | ICD-10-CM | POA: Diagnosis present

## 2018-05-12 LAB — CBC
HCT: 40 % (ref 36.0–46.0)
Hemoglobin: 12.3 g/dL (ref 12.0–15.0)
MCH: 28.9 pg (ref 26.0–34.0)
MCHC: 30.8 g/dL (ref 30.0–36.0)
MCV: 94.1 fL (ref 80.0–100.0)
NRBC: 0 % (ref 0.0–0.2)
PLATELETS: 232 10*3/uL (ref 150–400)
RBC: 4.25 MIL/uL (ref 3.87–5.11)
RDW: 15.4 % (ref 11.5–15.5)
WBC: 6.4 10*3/uL (ref 4.0–10.5)

## 2018-05-12 LAB — BASIC METABOLIC PANEL
ANION GAP: 5 (ref 5–15)
BUN: 14 mg/dL (ref 6–20)
CALCIUM: 9.4 mg/dL (ref 8.9–10.3)
CO2: 28 mmol/L (ref 22–32)
CREATININE: 0.43 mg/dL — AB (ref 0.44–1.00)
Chloride: 106 mmol/L (ref 98–111)
Glucose, Bld: 137 mg/dL — ABNORMAL HIGH (ref 70–99)
Potassium: 3.8 mmol/L (ref 3.5–5.1)
SODIUM: 139 mmol/L (ref 135–145)

## 2018-05-12 LAB — TROPONIN I

## 2018-05-12 MED ORDER — HYDROMORPHONE HCL 1 MG/ML IJ SOLN
1.0000 mg | Freq: Once | INTRAMUSCULAR | Status: AC
  Administered 2018-05-12: 1 mg via INTRAVENOUS
  Filled 2018-05-12: qty 1

## 2018-05-12 MED ORDER — PREDNISONE 10 MG (21) PO TBPK: ORAL_TABLET | Freq: Every day | ORAL | 0 refills | Status: DC

## 2018-05-12 MED ORDER — METHYLPREDNISOLONE SODIUM SUCC 125 MG IJ SOLR
125.0000 mg | Freq: Once | INTRAMUSCULAR | Status: AC
  Administered 2018-05-12: 125 mg via INTRAVENOUS
  Filled 2018-05-12: qty 2

## 2018-05-12 MED ORDER — SODIUM CHLORIDE 0.9 % IV BOLUS
1000.0000 mL | Freq: Once | INTRAVENOUS | Status: AC
  Administered 2018-05-12: 1000 mL via INTRAVENOUS

## 2018-05-12 NOTE — ED Notes (Signed)
IV in R ac not working,  Dilaudid and solumedrol actually given in IV in r hand.

## 2018-05-12 NOTE — ED Triage Notes (Signed)
Pt states she began having pain in the center of her chest through to both her shoulders yesterday while at the beach.  States she has this from time to time and has been dx with costochondritis.

## 2018-05-12 NOTE — ED Provider Notes (Signed)
[facility] EMERGENCY DEPARTMENT Provider Note   CSN: [id number] Arrival date & time: 05/12/18  1325     History   Chief Complaint Chief Complaint  Patient presents with  . Chest Pain     Dawn Hayes is a 48 y.o. female.  She states she has a history of costochondritis and having a flare of her costochondritis that started yesterday.  She is complaining of severe substernal chest pain that radiates into her both her shoulders.  Pain is sharp and severe in nature.  She is tried some of her chronic pain medicine hydrocodone without any relief.  She is under a pain contract for chronic back issues.  She says this is about her eighth episode of this and she usually responds to steroids and some pain medicine in the ED.  No fevers no cough no numbness no weakness no abdominal pain nausea vomiting or diarrhea.  She thinks what set it off was lifting her niece over the weekend.  The history is provided by the patient.  Chest Pain   This is a recurrent problem. The current episode started yesterday. The problem occurs constantly. The problem has not changed since onset.The pain is associated with lifting. The pain is present in the substernal region. The pain is severe. The quality of the pain is described as sharp and stabbing. The pain radiates to the left shoulder and right shoulder. Duration of episode(s) is 1 day. Associated symptoms include back pain. Pertinent negatives include no abdominal pain, no cough, no diaphoresis, no headaches, no hemoptysis, no nausea, no numbness, no shortness of breath, no syncope, no vomiting and no weakness. She has tried rest for the symptoms. The treatment provided no relief.  Pertinent negatives for past medical history include no CAD.    Past Medical History:  Diagnosis Date  . Anemia   . Arthritis    psoriatic arthritis  . Costochondritis   . Diverticulitis   . Dysmenorrhea   . Fibromyalgia   . GERD (gastroesophageal reflux disease)   .  Headache    Migraines  . History of hiatal hernia   . Hypertension   . Menorrhagia   . Psoriatic arthritis ([place])   . Smoker     Patient Active Problem List   Diagnosis Date Noted  . Nausea 01/27/2018  . Bariatric surgery status 01/26/2018  . RLS (restless legs syndrome) 01/26/2018  . Morbid obesity ([place]) 01/19/2018  . Vitamin D deficiency 11/10/2017  . Fibromyalgia 11/10/2017  . Anxiety and depression 05/27/2016  . Status post total abdominal hysterectomy 04/08/2016  . Costochondritis 12/04/2015  . Anemia, iron deficiency 10/10/2015  . Tick bites 10/10/2015  . Fatigue 10/10/2015  . Allergic rhinitis 09/07/2015  . Hemorrhoid   . Diverticulosis of colon without hemorrhage   . Hiatal hernia   . Rectal bleeding 05/08/2015  . Abnormal finding on EKG 09/16/2013  . Psoriatic arthritis ([place]) 09/16/2013  . HTN (hypertension) 09/16/2013  . Morbid obesity, unspecified obesity type ([place]) 09/16/2013  . GERD 12/11/2009  . DIARRHEA, CHRONIC 12/11/2009    Past Surgical History:  Procedure Laterality Date  . ABDOMINAL HYSTERECTOMY    . CARPAL TUNNEL RELEASE Left 01/19/2015   Procedure: LEFT CARPAL TUNNEL RELEASE;  Surgeon: Roseanne Kaufman, MD;  Location: [place];  Service: Orthopedics;  Laterality: Left;  . CHOLECYSTECTOMY    . CHOLECYSTECTOMY, LAPAROSCOPIC  2006  . COLONOSCOPY  1998   normal, [place]  . COLONOSCOPY N/A 05/30/2015   RMR: Minimal anal canal  hemorrhoids. colonic diverticulosis. I suspect relatively trivial recent gI Bleed likely related to hemorrhoids. This finding alone would not likely adequatly explain her degree of anemia. she likely has a significent component from menstrual losses. , etc.   . COLONOSCOPY WITH PROPOFOL N/A 03/09/2017   Dr. Gala Romney: scattered diverticula, distal 5cm of TI normal. Grade II non-bleeding internal hemorrhoids. next TCS 10 years.   . CYSTOSCOPY N/A 04/08/2016   Procedure: CYSTOSCOPY;  Surgeon: Nunzio Cobbs,  MD;  Location: [facility];  Service: Gynecology;  Laterality: N/A;  . DILATATION & CURETTAGE/HYSTEROSCOPY WITH MYOSURE N/A 01/04/2016   Procedure: DILATATION & CURETTAGE/HYSTEROSCOPY;  Surgeon: Nunzio Cobbs, MD;  Location: [facility];  Service: Gynecology;  Laterality: N/A;  . ESOPHAGOGASTRODUODENOSCOPY  09/11/99   tiny esophageal erosions with mild erosive reflux/no barrett's/normal stomach  . ESOPHAGOGASTRODUODENOSCOPY N/A 05/30/2015   RMR: Hiatal hernia otherwise negative EGD  . GASTRIC ROUX-EN-Y N/A 01/19/2018   Procedure: LAPAROSCOPIC ROUX-EN-Y GASTRIC BYPASS WITH HIATAL HERNIA REPAIR, WITH UPPER ENDOSCOPY, ERAS Pathway;  Surgeon: Greer Pickerel, MD;  Location: [facility];  Service: General;  Laterality: N/A;  . GIVENS CAPSULE STUDY N/A 08/14/2017   Procedure: GIVENS CAPSULE STUDY;  Surgeon: Daneil Dolin, MD;  [facility];  Service: Endoscopy;  Laterality: N/A;  7:30am  . HYSTERECTOMY ABDOMINAL WITH SALPINGECTOMY Bilateral 04/08/2016   Procedure: HYSTERECTOMY ABDOMINAL WITH SALPINGECTOMY;  Surgeon: Nunzio Cobbs, MD;  Location: [facility];  Service: Gynecology;  Laterality: Bilateral;  . LAPAROSCOPIC BILATERAL SALPINGECTOMY N/A 04/08/2016   Procedure: LAPAROSCOPIC BILATERAL SALPINGECTOMY possible BSO;  Surgeon: Nunzio Cobbs, MD;  Location: [facility];  Service: Gynecology;  Laterality: N/A;  . LAPAROSCOPIC HYSTERECTOMY N/A 04/08/2016   Procedure: ATTEMPTED HYSTERECTOMY TOTAL LAPAROSCOPIC;  Surgeon: Nunzio Cobbs, MD;  Location: [facility];  Service: Gynecology;  Laterality: N/A;  . LAPAROSCOPIC LYSIS OF ADHESIONS  04/08/2016   Procedure: LAPAROSCOPIC LYSIS OF ADHESIONS;  Surgeon: Nunzio Cobbs, MD;  Location: [facility];  Service: Gynecology;;  . LAPAROSCOPIC LYSIS OF ADHESIONS N/A 01/19/2018   Procedure: LAPAROSCOPIC LYSIS OF ADHESIONS;  Surgeon: Greer Pickerel, MD;  Location: [facility];  Service: General;  Laterality: N/A;     OB History    Gravida  0    Para      Term      Preterm      AB      Living        SAB      TAB      Ectopic      Multiple      Live Births               Home Medications    Prior to Admission medications   Medication Sig Start Date End Date Taking? Authorizing Provider  ALPRAZolam (NIRAVAM) 0.5 MG dissolvable tablet Take 1 tablet before MRI and 1 tablet after MRI. 02/17/18   Midge Minium, MD  betamethasone dipropionate (DIPROLENE) 0.05 % cream Apply topically 2 (two) times daily. 02/04/18   Leamon Arnt, MD  calcium carbonate (TUMS EX) 750 MG chewable tablet Chew 1 tablet by mouth 3 (three) times daily.    [provider]  diclofenac sodium (VOLTAREN) 1 % GEL Apply 2-4 g topically 4 (four) times daily as needed (for muscle/joint pain.).  09/25/17   [provider]  doxycycline (VIBRAMYCIN) 100 MG capsule Take 1 capsule (100 mg total) by mouth 2 (two)  times daily. 03/29/18   Brunetta Jeans, PA-C  DULoxetine (CYMBALTA) 60 MG capsule TAKE 1 CAPSULE(60 MG) BY MOUTH DAILY 04/29/18   Midge Minium, MD  esomeprazole (NEXIUM) 40 MG capsule TAKE 1 CAPSULE(40 MG) BY MOUTH DAILY BEFORE BREAKFAST Patient taking differently: Take 40 mg by mouth daily.  09/26/17   Mahala Menghini, PA-C  fluticasone (FLONASE) 50 MCG/ACT nasal spray Place 2 sprays into both nostrils daily. 03/24/18   Hassell Done, Mary-Margaret, FNP  gabapentin (NEURONTIN) 100 MG capsule Take 2 capsules (200 mg total) by mouth 3 (three) times daily. 03/24/18   Brunetta Jeans, PA-C  gabapentin (NEURONTIN) 600 MG tablet Take 1 tablet (600 mg total) by mouth at bedtime. 05/06/18   Midge Minium, MD  hydrochlorothiazide (HYDRODIURIL) 25 MG tablet TAKE 1 TABLET(25 MG) BY MOUTH DAILY Patient taking differently: Take 25 mg by mouth daily.  02/02/18   Midge Minium, MD  HYDROcodone-acetaminophen (NORCO) 10-325 MG tablet Take 1 tablet by mouth 3 (three) times daily as needed (for pain).  12/14/17   [provider]  ondansetron (ZOFRAN-ODT) 4 MG disintegrating tablet Take 1 tablet (4 mg total) by mouth every 6 (six) hours as needed for nausea or vomiting. 01/21/18   Greer Pickerel, MD  OVER THE COUNTER MEDICATION Take 1 tablet by mouth daily. Bariatric Vitamin    [provider]  promethazine (PHENERGAN) 12.5 MG tablet Take 12.5 mg by mouth every 8 (eight) hours as needed for nausea. 02/11/18   [provider]  ustekinumab (STELARA) 130 MG/26ML SOLN injection Inject 130 mg into the vein every 3 (three) months.     [provider]  XIIDRA 5 % SOLN Place 1 drop into both eyes daily as needed (dry/irritated eyes.).  10/10/16   [provider]    Family History Family History  Problem Relation Age of Onset  . Brain cancer Father   . Prostate cancer Father   . Hyperlipidemia Mother   . Colon cancer Maternal Grandmother 55  . Stroke Paternal Grandfather        COD  . Aneurysm Maternal Grandfather   . Cancer Other   . Hypertension Other   . Other Paternal Uncle        gastric polyps    Social History Social History   Tobacco Use  . Smoking status: Former Smoker    Packs/day: 0.10    Years: 20.00    Pack years: 2.00    Types: Cigarettes  . Smokeless tobacco: Never Used  . Tobacco comment: stopped smoking 6 months ago   Substance Use Topics  . Alcohol use: Yes    Alcohol/week: 16.0 standard drinks    Types: 6 Glasses of wine, 10 Standard drinks or equivalent per week    Comment: socially, weekends 4-5  . Drug use: No     Allergies   Aspirin; Toradol [ketorolac tromethamine]; and Adhesive [tape]   Review of Systems Review of Systems  Constitutional: Negative for diaphoresis.  HENT: Negative for sore throat.   Eyes: Negative for visual disturbance.  Respiratory: Negative for cough, hemoptysis and shortness of breath.   Cardiovascular: Positive for chest pain. Negative for syncope.  Gastrointestinal: Negative for abdominal pain, nausea and  vomiting.  Genitourinary: Negative for dysuria.  Musculoskeletal: Positive for back pain.  Skin: Negative for rash.  Neurological: Negative for weakness, numbness and headaches.     Physical Exam Updated Vital Signs BP 121/70 (BP Location: Right Arm)   Pulse 89  Temp 97.6 F (36.4 C) (Oral)   Resp 18   Ht 5\' 2"  (1.575 m)   Wt 96.2 kg   LMP 02/28/2016   SpO2 100%   BMI 38.78 kg/m   Physical Exam Vitals signs and nursing note reviewed.  Constitutional:      General: She is not in acute distress.    Appearance: She is well-developed.  HENT:     Head: Normocephalic and atraumatic.  Eyes:     Conjunctiva/sclera: Conjunctivae normal.  Neck:     Musculoskeletal: Neck supple.  Cardiovascular:     Rate and Rhythm: Normal rate and regular rhythm.     Heart sounds: Normal heart sounds. No murmur.  Pulmonary:     Effort: Pulmonary effort is normal. No respiratory distress.     Breath sounds: Normal breath sounds.  Chest:     Chest wall: Tenderness present.  Abdominal:     Palpations: Abdomen is soft.     Tenderness: There is no abdominal tenderness.  Musculoskeletal: Normal range of motion.     Right lower leg: No edema.     Left lower leg: No edema.  Skin:    General: Skin is warm and dry.     Capillary Refill: Capillary refill takes less than 2 seconds.  Neurological:     General: No focal deficit present.     Mental Status: She is alert.     Motor: No weakness.      ED Treatments / Results  Labs (all labs ordered are listed, but only abnormal results are displayed) Labs Reviewed  BASIC METABOLIC PANEL - Abnormal; Notable for the following components:      Result Value   Glucose, Bld 137 (*)    Creatinine, Ser 0.43 (*)    All other components within normal limits  CBC  TROPONIN I    EKG EKG Interpretation  Date/Time:  Wednesday May 12 2018 13:43:26 EST Ventricular Rate:  88 PR Interval:    QRS Duration: 90 QT Interval:  395 QTC  Calculation: 478 R Axis:   2 Text Interpretation:  Sinus rhythm Low voltage, extremity and precordial leads Baseline wander in lead(s) II III aVF similar to prior 1/19 Confirmed by Aletta Edouard [id number]) on 05/12/2018 2:00:03 PM   Radiology Dg Chest 2 View  Result Date: 05/12/2018 CLINICAL DATA:  Chest pain. EXAM: CHEST - 2 VIEW COMPARISON:  06/09/2017 FINDINGS: The heart size and mediastinal contours are within normal limits. Both lungs are clear. The visualized skeletal structures are unremarkable. IMPRESSION: Normal exam. Electronically Signed   By: Lorriane Shire M.D.   On: 05/12/2018 15:00    Procedures Procedures (including critical care time)  Medications Ordered in ED Medications  methylPREDNISolone sodium succinate (SOLU-MEDROL) 125 mg/2 mL injection 125 mg (125 mg Intravenous Given 05/12/18 1409)  HYDROmorphone (DILAUDID) injection 1 mg (1 mg Intravenous Given 05/12/18 1409)  HYDROmorphone (DILAUDID) injection 1 mg (1 mg Intravenous Given 05/12/18 1523)  sodium chloride 0.9 % bolus 1,000 mL (0 mLs Intravenous Stopped 05/12/18 1649)  HYDROmorphone (DILAUDID) injection 1 mg (1 mg Intravenous Given 05/12/18 1735)     Initial Impression / Assessment and Plan / ED Course  I have reviewed the triage vital signs and the nursing notes.  Pertinent labs & imaging results that were available during my care of the patient were reviewed by me and considered in my medical decision making (see chart for details).  Clinical Course as of May 13 908  Wed May 12, 2018  1510 Patient states her pain is a little improved.  Her blood pressure is low now so I am to give her some IV fluids and will try another dose of pain medicine.  She says this is the same pain that she always gets and does not feel there is anything different about this.   [MB]  1510 She is not tachycardic and satting 100%.   [MB]  2500 Patient has had her second dose of morphine and still says she has had no relief.   [MB]  3704  Patient states her pain is now 2 out of 10 and she is feeling better.  We will discharge her on a steroid taper.   [MB]    Clinical Course User Index [MB] Hayden Rasmussen, MD    Final Clinical Impressions(s) / ED Diagnoses   Final diagnoses:  Chest wall pain    ED Discharge Orders         Ordered    predniSONE (STERAPRED UNI-PAK 21 TAB) 10 MG (21) TBPK tablet  Daily     05/12/18 1818           Hayden Rasmussen, MD 05/13/18 [phone]

## 2018-05-12 NOTE — Discharge Instructions (Addendum)
You were evaluated in the emergency department for severe chest wall pain.  There was no evidence of any cardiac injury and your chest x-ray was normal.  You improved with some pain medicine and steroids.  We are sending you home with a prescription for a steroid taper.  Please follow-up with your doctor for further evaluation of this.  Return if any concerns.

## 2018-05-20 ENCOUNTER — Encounter: Payer: Self-pay | Admitting: Family Medicine

## 2018-05-21 MED ORDER — PREDNISONE 10 MG PO TABS: ORAL_TABLET | ORAL | 0 refills | Status: DC

## 2018-05-24 ENCOUNTER — Encounter: Payer: Self-pay | Admitting: Family Medicine

## 2018-05-24 MED ORDER — NYSTATIN 100000 UNIT/ML MT SUSP: 5.0000 mL | Freq: Three times a day (TID) | OROMUCOSAL | 0 refills | Status: DC

## 2018-05-24 MED ORDER — VARENICLINE TARTRATE 1 MG PO TABS: 1.0000 mg | ORAL_TABLET | Freq: Two times a day (BID) | ORAL | 3 refills | Status: DC

## 2018-05-24 MED ORDER — VARENICLINE TARTRATE 0.5 MG X 11 & 1 MG X 42 PO MISC: ORAL | 0 refills | Status: DC

## 2018-05-24 NOTE — Addendum Note (Signed)
Addended by: Midge Minium on: 05/24/2018 04:18 PM   Modules accepted: Orders

## 2018-06-02 ENCOUNTER — Other Ambulatory Visit: Payer: Self-pay | Admitting: Family Medicine

## 2018-06-03 DIAGNOSIS — Z79891 Long term (current) use of opiate analgesic: Secondary | ICD-10-CM | POA: Diagnosis not present

## 2018-06-03 DIAGNOSIS — M47816 Spondylosis without myelopathy or radiculopathy, lumbar region: Secondary | ICD-10-CM | POA: Diagnosis not present

## 2018-06-03 DIAGNOSIS — M797 Fibromyalgia: Secondary | ICD-10-CM | POA: Diagnosis not present

## 2018-06-03 DIAGNOSIS — Z79899 Other long term (current) drug therapy: Secondary | ICD-10-CM | POA: Diagnosis not present

## 2018-06-03 DIAGNOSIS — G894 Chronic pain syndrome: Secondary | ICD-10-CM | POA: Diagnosis not present

## 2018-06-08 DIAGNOSIS — L4050 Arthropathic psoriasis, unspecified: Secondary | ICD-10-CM | POA: Diagnosis not present

## 2018-06-08 DIAGNOSIS — L409 Psoriasis, unspecified: Secondary | ICD-10-CM | POA: Diagnosis not present

## 2018-06-08 DIAGNOSIS — Z79899 Other long term (current) drug therapy: Secondary | ICD-10-CM | POA: Diagnosis not present

## 2018-06-16 ENCOUNTER — Other Ambulatory Visit: Payer: Self-pay

## 2018-06-16 ENCOUNTER — Ambulatory Visit: Payer: Self-pay | Admitting: Family Medicine

## 2018-06-16 ENCOUNTER — Ambulatory Visit (INDEPENDENT_AMBULATORY_CARE_PROVIDER_SITE_OTHER): Payer: 59 | Admitting: Physician Assistant

## 2018-06-16 ENCOUNTER — Encounter: Payer: Self-pay | Admitting: Physician Assistant

## 2018-06-16 VITALS — BP 102/68 | HR 87 | Temp 97.9°F | Resp 16 | Ht 62.0 in | Wt 207.0 lb

## 2018-06-16 DIAGNOSIS — M940 Chondrocostal junction syndrome [Tietze]: Secondary | ICD-10-CM

## 2018-06-16 DIAGNOSIS — M797 Fibromyalgia: Secondary | ICD-10-CM

## 2018-06-16 MED ORDER — METHYLPREDNISOLONE ACETATE 80 MG/ML IJ SUSP
80.0000 mg | Freq: Once | INTRAMUSCULAR | Status: AC
  Administered 2018-06-16: 80 mg via INTRAMUSCULAR

## 2018-06-16 NOTE — Progress Notes (Signed)
Patient presents to clinic today c/o flare of her fibromyalgia and costochondritis. Notes symptoms starting on Monday. No noted inciting incident. Notes sternal pain and tenderness, tenderness to her R trapezius. Also notes some milder back pain, left-sided and upper lumbar region. Notes tension in this area and thinks she may have pulled it. Notes the back symptoms are improving since onset. Is taking her Hydrocodone, Gabapentin for pain with some relief.   Past Medical History:  Diagnosis Date  . Anemia   . Arthritis    psoriatic arthritis  . Costochondritis   . Diverticulitis   . Dysmenorrhea   . Fibromyalgia   . GERD (gastroesophageal reflux disease)   . Headache    Migraines  . History of hiatal hernia   . Hypertension   . Menorrhagia   . Psoriatic arthritis ([place])   . Smoker     Current Outpatient Medications on File Prior to Visit  Medication Sig Dispense Refill  . ALPRAZolam (NIRAVAM) 0.5 MG dissolvable tablet Take 1 tablet before MRI and 1 tablet after MRI. 2 tablet 0  . betamethasone dipropionate (DIPROLENE) 0.05 % cream Apply topically 2 (two) times daily. 45 g 0  . calcium carbonate (TUMS EX) 750 MG chewable tablet Chew 1 tablet by mouth 3 (three) times daily.    . diclofenac sodium (VOLTAREN) 1 % GEL Apply 2-4 g topically 4 (four) times daily as needed (for muscle/joint pain.).   0  . DULoxetine (CYMBALTA) 60 MG capsule TAKE 1 CAPSULE(60 MG) BY MOUTH DAILY 30 capsule 3  . esomeprazole (NEXIUM) 40 MG capsule TAKE 1 CAPSULE(40 MG) BY MOUTH DAILY BEFORE BREAKFAST (Patient taking differently: Take 40 mg by mouth daily. ) 30 capsule 11  . fluticasone (FLONASE) 50 MCG/ACT nasal spray Place 2 sprays into both nostrils daily. 16 g 6  . gabapentin (NEURONTIN) 100 MG capsule Take 2 capsules (200 mg total) by mouth 3 (three) times daily. 180 capsule 3  . gabapentin (NEURONTIN) 600 MG tablet Take 1 tablet (600 mg total) by mouth at bedtime. 30 tablet 3  . hydrochlorothiazide  (HYDRODIURIL) 25 MG tablet TAKE 1 TABLET(25 MG) BY MOUTH DAILY 30 tablet 3  . HYDROcodone-acetaminophen (NORCO) 10-325 MG tablet Take 1 tablet by mouth 3 (three) times daily as needed (for pain).   0  . nystatin (MYCOSTATIN) 100000 UNIT/ML suspension Take 5 mLs (500,000 Units total) by mouth 3 (three) times daily. 120 mL 0  . ondansetron (ZOFRAN-ODT) 4 MG disintegrating tablet Take 1 tablet (4 mg total) by mouth every 6 (six) hours as needed for nausea or vomiting. 20 tablet 0  . OVER THE COUNTER MEDICATION Take 1 tablet by mouth daily. Bariatric Vitamin    . promethazine (PHENERGAN) 12.5 MG tablet Take 12.5 mg by mouth every 8 (eight) hours as needed for nausea.  0  . XIIDRA 5 % SOLN Place 1 drop into both eyes daily as needed (dry/irritated eyes.).   12   No current facility-administered medications on file prior to visit.     Allergies  Allergen Reactions  . Aspirin Other (See Comments)    Convulsions   . Toradol [Ketorolac Tromethamine] Nausea And Vomiting  . Adhesive [Tape] Itching and Rash    Family History  Problem Relation Age of Onset  . Brain cancer Father   . Prostate cancer Father   . Hyperlipidemia Mother   . Colon cancer Maternal Grandmother 60  . Stroke Paternal Grandfather        COD  . Aneurysm Maternal  Grandfather   . Cancer Other   . Hypertension Other   . Other Paternal Uncle        gastric polyps    Social History   Socioeconomic History  . Marital status: Married    Spouse name: Not on file  . Number of children: 0  . Years of education: Not on file  . Highest education level: Not on file  Occupational History  . Occupation: [employer] attorney, [place]  Social Needs  . Financial resource strain: Not on file  . Food insecurity:    Worry: Never true    Inability: Never true  . Transportation needs:    Medical: Not on file    Non-medical: Not on file  Tobacco Use  . Smoking status: Former Smoker    Packs/day: 0.10    Years: 20.00    Pack  years: 2.00    Types: Cigarettes  . Smokeless tobacco: Never Used  . Tobacco comment: stopped smoking 6 months ago   Substance and Sexual Activity  . Alcohol use: Yes    Alcohol/week: 16.0 standard drinks    Types: 6 Glasses of wine, 10 Standard drinks or equivalent per week    Comment: socially, weekends 4-5  . Drug use: No  . Sexual activity: Yes    Partners: Male    Birth control/protection: None  Lifestyle  . Physical activity:    Days per week: Not on file    Minutes per session: Not on file  . Stress: Not on file  Relationships  . Social connections:    Talks on phone: Not on file    Gets together: Not on file    Attends religious service: Not on file    Active member of club or organization: Not on file    Attends meetings of clubs or organizations: Not on file    Relationship status: Not on file  Other Topics Concern  . Not on file  Social History Narrative  . Not on file   Review of Systems - See HPI.  All other ROS are negative.  BP 102/68   Pulse 87   Temp 97.9 F (36.6 C) (Oral)   Resp 16   Ht 5\' 2"  (1.575 m)   Wt 207 lb (93.9 kg)   LMP 02/28/2016   SpO2 98%   BMI 37.86 kg/m   Physical Exam Vitals signs reviewed.  Constitutional:      Appearance: She is well-developed.  HENT:     Head: Normocephalic and atraumatic.  Neck:     Musculoskeletal: Neck supple.  Cardiovascular:     Rate and Rhythm: Normal rate and regular rhythm.  Pulmonary:     Effort: Pulmonary effort is normal.     Breath sounds: Normal breath sounds.  Chest:     Chest wall: Tenderness (sternal tenderness) present. No mass or edema.  Neurological:     Mental Status: She is alert.  Psychiatric:        Mood and Affect: Mood normal.     Recent Results (from the past 2160 hour(s))  QuantiFERON-TB Gold Plus     Status: None   Collection Time: 03/29/18 11:25 AM  Result Value Ref Range   QuantiFERON-TB Gold Plus NEGATIVE NEGATIVE    Comment: Negative test result. M.  tuberculosis complex  infection unlikely.    NIL 0.02 IU/mL   Mitogen-NIL >10.00 IU/mL   TB1-NIL 0.00 IU/mL   TB2-NIL <0.00 IU/mL    Comment: . The Nil tube value  reflects the background interferon gamma immune response of the patient's blood sample. This value has been subtracted from the patient's displayed TB and Mitogen results. . Lower than expected results with the Mitogen tube prevent false-negative Quantiferon readings by detecting a patient with a potential immune suppressive condition and/or suboptimal pre-analytical specimen handling. . The TB1 Antigen tube is coated with the M. tuberculosis-specific antigens designed to elicit responses from TB antigen primed CD4+ helper T-lymphocytes. . The TB2 Antigen tube is coated with the M. tuberculosis-specific antigens designed to elicit responses from TB antigen primed CD4+ helper and CD8+ cytotoxic T-lymphocytes. . For additional information, please refer to https://education.questdiagnostics.com/faq/FAQ204 (This link is being provided for informational/ educational purposes only.) .   Basic metabolic panel     Status: None   Collection Time: 04/28/18  1:47 PM  Result Value Ref Range   Sodium 140 135 - 145 mEq/L   Potassium 3.8 3.5 - 5.1 mEq/L   Chloride 104 96 - 112 mEq/L   CO2 28 19 - 32 mEq/L   Glucose, Bld 89 70 - 99 mg/dL   BUN 15 6 - 23 mg/dL   Creatinine, Ser 0.53 0.40 - 1.20 mg/dL   Calcium 9.6 8.4 - 10.5 mg/dL   GFR 131.12 >60.00 mL/min  TSH     Status: None   Collection Time: 04/28/18  1:47 PM  Result Value Ref Range   TSH 1.03 0.35 - 4.50 uIU/mL  CBC with Differential/Platelet     Status: Abnormal   Collection Time: 04/28/18  1:47 PM  Result Value Ref Range   WBC 7.0 4.0 - 10.5 K/uL   RBC 4.51 3.87 - 5.11 Mil/uL   Hemoglobin 13.4 12.0 - 15.0 g/dL   HCT 40.8 36.0 - 46.0 %   MCV 90.5 78.0 - 100.0 fl   MCHC 32.8 30.0 - 36.0 g/dL   RDW 16.7 (H) 11.5 - 15.5 %   Platelets 281.0 150.0 - 400.0 K/uL    Neutrophils Relative % 59.8 43.0 - 77.0 %   Lymphocytes Relative 30.7 12.0 - 46.0 %   Monocytes Relative 7.2 3.0 - 12.0 %   Eosinophils Relative 1.6 0.0 - 5.0 %   Basophils Relative 0.7 0.0 - 3.0 %   Neutro Abs 4.2 1.4 - 7.7 K/uL   Lymphs Abs 2.2 0.7 - 4.0 K/uL   Monocytes Absolute 0.5 0.1 - 1.0 K/uL   Eosinophils Absolute 0.1 0.0 - 0.7 K/uL   Basophils Absolute 0.0 0.0 - 0.1 K/uL  VITAMIN D 25 Hydroxy (Vit-D Deficiency, Fractures)     Status: None   Collection Time: 04/28/18  1:47 PM  Result Value Ref Range   VITD 40.43 30.00 - 100.00 ng/mL  Iron, TIBC and Ferritin Panel     Status: None   Collection Time: 04/28/18  1:47 PM  Result Value Ref Range   Iron 78 40 - 190 mcg/dL   TIBC 278 250 - 450 mcg/dL (calc)   %SAT 28 16 - 45 % (calc)   Ferritin 83 16 - 232 ng/mL  Basic metabolic panel     Status: Abnormal   Collection Time: 05/12/18  1:51 PM  Result Value Ref Range   Sodium 139 135 - 145 mmol/L   Potassium 3.8 3.5 - 5.1 mmol/L   Chloride 106 98 - 111 mmol/L   CO2 28 22 - 32 mmol/L   Glucose, Bld 137 (H) 70 - 99 mg/dL   BUN 14 6 - 20 mg/dL   Creatinine, Ser 0.43 (L) 0.44 -  1.00 mg/dL   Calcium 9.4 8.9 - 10.3 mg/dL   GFR calc non Af Amer >60 >60 mL/min   GFR calc Af Amer >60 >60 mL/min   Anion gap 5 5 - 15    Comment: Performed at [facility], [address], [place], [place] [address]  CBC     Status: None   Collection Time: 05/12/18  1:51 PM  Result Value Ref Range   WBC 6.4 4.0 - 10.5 K/uL   RBC 4.25 3.87 - 5.11 MIL/uL   Hemoglobin 12.3 12.0 - 15.0 g/dL   HCT 40.0 36.0 - 46.0 %   MCV 94.1 80.0 - 100.0 fL   MCH 28.9 26.0 - 34.0 pg   MCHC 30.8 30.0 - 36.0 g/dL   RDW 15.4 11.5 - 15.5 %   Platelets 232 150 - 400 K/uL   nRBC 0.0 0.0 - 0.2 %    Comment: Performed at [facility], [address], [place], [place] [address]  Troponin I - ONCE - STAT     Status: None   Collection Time: 05/12/18  1:51 PM  Result Value Ref Range   Troponin I <0.03 <0.03 ng/mL     Comment: Performed at [facility], [address], [place], [place] [address]    Assessment/Plan: 1. Costochondritis 2. Fibromyalgia Continue current chronic regimen. IM Depomedrol 80 mg given. May need to consider oral taper but trying to avoid giving bariatric surgery.   - methylPREDNISolone acetate (DEPO-MEDROL) injection 80 mg   Leeanne Rio, [place]

## 2018-06-16 NOTE — Patient Instructions (Addendum)
Please continue medications as directed.  The steroid injection will start to help calm symptoms down. We may have to consider a steroid taper if symptoms are not significantly improving with the injection. Avoid heavy lifting or overexertion. Heating pad to the area in 10-15 minute intervals.

## 2018-06-16 NOTE — Telephone Encounter (Signed)
Pt. Reports she has a long history of costochondritis, and is having a flare up now. Started Monday.Has pain mid-sternum and right neck and shoulder. Denies any other symptoms. Has been taking her pain medication to help with the pain. Pain is 4/10 right now. No availability with Dr. Birdie Riddle. States she has seen Mr. Hassell Done as well. Appointment made. Reason for Disposition . [1] Chest pain lasts > 5 minutes AND [2] occurred > 3 days ago (72 hours) AND [3] NO chest pain or cardiac symptoms now  Answer Assessment - Initial Assessment Questions 1. LOCATION: "Where does it hurt?"        Middle of my sternum 2. RADIATION: "Does the pain go anywhere else?" (e.g., into neck, jaw, arms, back)     Neck,shoulder,chest pain (right) 3. ONSET: "When did the chest pain begin?" (Minutes, hours or days)        Started Monday 4. PATTERN "Does the pain come and go, or has it been constant since it started?"  "Does it get worse with exertion?"      Constant 5. DURATION: "How long does it last" (e.g., seconds, minutes, hours)     Several days 6. SEVERITY: "How bad is the pain?"  (e.g., Scale 1-10; mild, moderate, or severe)    - MILD (1-3): doesn't interfere with normal activities     - MODERATE (4-7): interferes with normal activities or awakens from sleep    - SEVERE (8-10): excruciating pain, unable to do any normal activities       Moderate 7. CARDIAC RISK FACTORS: "Do you have any history of heart problems or risk factors for heart disease?" (e.g., prior heart attack, angina; high blood pressure, diabetes, being overweight, high cholesterol, smoking, or strong family history of heart disease)     No 8. PULMONARY RISK FACTORS: "Do you have any history of lung disease?"  (e.g., blood clots in lung, asthma, emphysema, birth control pills)     No 9. CAUSE: "What do you think is causing the chest pain?"     Inflammation to ribs 10. OTHER SYMPTOMS: "Do you have any other symptoms?" (e.g., dizziness, nausea,  vomiting, sweating, fever, difficulty breathing, cough)       No 11. PREGNANCY: "Is there any chance you are pregnant?" "When was your last menstrual period?"       No  Protocols used: CHEST PAIN-A-AH

## 2018-06-18 ENCOUNTER — Encounter: Payer: Self-pay | Admitting: Physician Assistant

## 2018-06-18 ENCOUNTER — Other Ambulatory Visit: Payer: Self-pay | Admitting: Physician Assistant

## 2018-06-18 MED ORDER — CYCLOBENZAPRINE HCL 10 MG PO TABS: 10.0000 mg | ORAL_TABLET | Freq: Three times a day (TID) | ORAL | 0 refills | Status: DC | PRN

## 2018-06-22 ENCOUNTER — Ambulatory Visit: Payer: Self-pay

## 2018-06-29 DIAGNOSIS — L4050 Arthropathic psoriasis, unspecified: Secondary | ICD-10-CM | POA: Diagnosis not present

## 2018-07-05 DIAGNOSIS — G894 Chronic pain syndrome: Secondary | ICD-10-CM | POA: Diagnosis not present

## 2018-07-05 DIAGNOSIS — M797 Fibromyalgia: Secondary | ICD-10-CM | POA: Diagnosis not present

## 2018-07-05 DIAGNOSIS — M47816 Spondylosis without myelopathy or radiculopathy, lumbar region: Secondary | ICD-10-CM | POA: Diagnosis not present

## 2018-07-09 ENCOUNTER — Encounter: Payer: Self-pay | Admitting: Family Medicine

## 2018-07-12 MED ORDER — NYSTATIN 100000 UNIT/ML MT SUSP: 5.0000 mL | Freq: Three times a day (TID) | OROMUCOSAL | 0 refills | Status: DC

## 2018-07-13 ENCOUNTER — Telehealth: Payer: Self-pay | Admitting: Family Medicine

## 2018-07-13 NOTE — Telephone Encounter (Signed)
Copied from [place] [phone]. Topic: Quick Communication - See Telephone Encounter >> Jul 13, 2018  1:09 PM Rutherford Nail, NT wrote: CRM for notification. See Telephone encounter for: 07/13/18.  See MyChart Message from 07/09/2018.  Patient calling to check status of getting the Wellbutrin sent to pharmacy today. States that she would like to pick that medication up along with the mouth wash that was called in. Please advise.

## 2018-07-14 ENCOUNTER — Encounter: Payer: Self-pay | Admitting: *Deleted

## 2018-07-14 MED ORDER — BUPROPION HCL ER (SR) 150 MG PO TB12: 150.0000 mg | ORAL_TABLET | Freq: Two times a day (BID) | ORAL | 3 refills | Status: DC

## 2018-07-14 NOTE — Telephone Encounter (Signed)
Responded to patient via MyChart.

## 2018-07-14 NOTE — Addendum Note (Signed)
Addended by: Midge Minium on: 07/14/2018 07:53 AM   Modules accepted: Orders

## 2018-07-20 ENCOUNTER — Encounter: Payer: 59 | Attending: General Surgery | Admitting: Skilled Nursing Facility1

## 2018-07-20 DIAGNOSIS — E669 Obesity, unspecified: Secondary | ICD-10-CM | POA: Diagnosis not present

## 2018-07-21 DIAGNOSIS — L4050 Arthropathic psoriasis, unspecified: Secondary | ICD-10-CM | POA: Diagnosis not present

## 2018-07-22 NOTE — Progress Notes (Signed)
Follow-up visit:  Post-Operative RYGB Surgery  Medical Nutrition Therapy:  Appt start time: 6:00pm end time:  7:00pm  Primary concerns today: Post-operative Bariatric Surgery Nutrition Management 6 Month Post-Op Class  Anthropometrics  Start weight at [facility]: 273.5 lbs (Date: 05/27/2017)   Body Composition Scale  Total Body Fat: 40.9 %  Visceral Fat: 13  Fat-Free Mass:  59 %   Total Body Water: 44  %  Muscle-Mass: 29.2 lbs  Body Fat Displacement:  Torso: 49.6  lbs Left Leg:9.9  lbs Right Leg:  9.9lbs Left Arm:  4.9 lbs Right Arm:  4.9 lbs     Information Reviewed/ Discussed During Appointment: -Review of composition scale numbers -Fluid requirements (64-100 ounces) -Protein requirements (60-80g) -Strategies for tolerating diet -Advancement of diet to include Starchy vegetables -Barriers to inclusion of new foods -Inclusion of appropriate multivitamin and calcium supplements  -Exercise recommendations   Fluid intake: adequate   Medications: See List Supplementation: appropriate   Using straws: no Drinking while eating: no Having you been chewing well: yes Chewing/swallowing difficulties: no Changes in vision: no Changes to mood/headaches: no Hair loss/[place] to skin/Changes to nails: no Any difficulty focusing or concentrating: no Sweating: no Dizziness/Lightheaded: no Palpitations: no  Carbonated beverages: no N/V/D/C/GAS: no Abdominal Pain: no Dumping syndrome: no  Recent physical activity:  ADL's  Progress Towards Goal(s):  In Progress  Handouts given during visit include:  Phase V diet Progression   Goals Sheet  The Benefits of Exercise are endless.....  Support Group Topics  Pt Chosen Goals: Nutrition Goals: No goals identified   Teaching Method Utilized:  Visual Auditory Hands on  Demonstrated degree of understanding via:  Teach Back   Monitoring/Evaluation:  Dietary intake, exercise, and body weight. Follow up in 3 months for  9 month post-op visit.

## 2018-07-30 ENCOUNTER — Telehealth: Payer: 59 | Admitting: Nurse Practitioner

## 2018-07-30 ENCOUNTER — Ambulatory Visit: Payer: Self-pay | Admitting: *Deleted

## 2018-07-30 DIAGNOSIS — R059 Cough, unspecified: Secondary | ICD-10-CM

## 2018-07-30 DIAGNOSIS — R05 Cough: Secondary | ICD-10-CM

## 2018-07-30 NOTE — Progress Notes (Signed)
E-Visit for Corona Virus Screening  Based on your current symptoms, it seems unlikely that your symptoms are related to the Coronavirus.  * at this time [place] is no longer testing for corona virus. We recommend that if you think you may have it to quarantine yourself for 14 days.  Coronavirus disease 2019 (COVID-19) is a respiratory illness that can spread from person to person. The virus that causes COVID-19 is a new virus that was first identified in the country of [place] but is now found in multiple other countries and has spread to the [place].  Symptoms associated with the virus are mild to severe fever, cough, and shortness of breath. There is currently no vaccine to protect against COVID-19, and there is no specific antiviral treatment for the virus.   To be considered HIGH RISK for Coronavirus (COVID-19), you have to meet the following criteria:  . Traveled to [place], [place], [place], [place] or [place]; or in the [place] to [place], [place], [place], or [place]; and have fever, cough, and shortness of breath within the last 2 weeks of travel OR  . Been in close contact with a person diagnosed with COVID-19 within the last 2 weeks and have fever, cough, and shortness of breath  . IF YOU DO NOT MEET THESE CRITERIA, YOU ARE CONSIDERED LOW RISK FOR COVID-19.   It is vitally important that if you feel that you have an infection such as this virus or any other virus that you stay home and away from places where you may spread it to others.  You should self-quarantine for 14 days if you have symptoms that could potentially be coronavirus and avoid contact with people age 56 and older.   You can use medication such as delsym or mucinex OTC  You may also take acetaminophen (Tylenol) as needed for fever.   Reduce your risk of any infection by using the same precautions used for avoiding the common cold or flu:  Marland Kitchen Wash your hands often with soap and warm water for at least  20 seconds.  If soap and water are not readily available, use an alcohol-based hand sanitizer with at least 60% alcohol.  . If coughing or sneezing, cover your mouth and nose by coughing or sneezing into the elbow areas of your shirt or coat, into a tissue or into your sleeve (not your hands). . Avoid shaking hands with others and consider head nods or verbal greetings only. . Avoid touching your eyes, nose, or mouth with unwashed hands.  . Avoid close contact with people who are sick. . Avoid places or events with large numbers of people in one location, like concerts or sporting events. . Carefully consider travel plans you have or are making. . If you are planning any travel outside or inside the [place], visit the CDC's Travelers' Health webpage for the latest health notices. . If you have some symptoms but not all symptoms, continue to monitor at home and seek medical attention if your symptoms worsen. . If you are having a medical emergency, call 911.  HOME CARE . Only take medications as instructed by your medical team. . Drink plenty of fluids and get plenty of rest. . A steam or ultrasonic humidifier can help if you have congestion.   GET HELP RIGHT AWAY IF: . You develop worsening fever. . You become short of breath . You cough up blood. . Your symptoms become more severe MAKE SURE YOU   Understand  these instructions.  Will watch your condition.  Will get help right away if you are not doing well or get worse.  Your e-visit answers were reviewed by a board certified advanced clinical practitioner to complete your personal care plan.  Depending on the condition, your plan could have included both over the counter or prescription medications.  If there is a problem please reply once you have received a response from your provider. Your safety is important to [place].  If you have drug allergies check your prescription carefully.    You can use MyChart to ask questions about today's  visit, request a non-urgent call back, or ask for a work or school excuse for 24 hours related to this e-Visit. If it has been greater than 24 hours you will need to follow up with your provider, or enter a new e-Visit to address those concerns. You will get an e-mail in the next two days asking about your experience.  I hope that your e-visit has been valuable and will speed your recovery. Thank you for using e-visits.  5 minutes spent reviewing and documenting in chart.

## 2018-07-30 NOTE — Telephone Encounter (Signed)
Pt calling stating that she received an email from her department today that one of her coworkers tested positive for COVID-19. Pt states that the last time she came into contact with the coworker was on 07/20/18. Pt states that coworker was hospitalized over the weekend as well. Pt denies any symptoms at this time. Pt advised to complete e-Visit for further recommendations and to return call to office if symptoms develop. Pt verbalized understanding.   Reason for Disposition . [1] COVID-19 EXPOSURE (Close Contact) within last 14 days AND [2] NO cough, fever, or breathing difficulty  Answer Assessment - Initial Assessment Questions 1. CONFIRMED CASE: "Who is the person with the confirmed COVID-19 infection that you were exposed to?"     confimrd by a coworker 2. PLACE of CONTACT: "Where were you when you were exposed to COVID-19  (coronavirus disease 2019)?" (e.g., city, state, country)     *No Answer* 3. TYPE of CONTACT: "How much contact was there?" (e.g., live in same house, work in same office, same school)     In my department in a four square pod 4. DATE of CONTACT: "When did you have contact with a coronavirus patient?" (e.g., days)     In hopsital over the weekend because fever has spiked 5. DURATION of CONTACT: "How long were you in contact with the COVID-19 (coronavirus disease) patient?" (e.g., a few seconds, passed by person, a few minutes, live with the patient)     [place] has sent email 6. SYMPTOMS: "Do you have any symptoms?" (e.g., fever, cough, breathing difficulty)     n/a 7. PREGNANCY OR POSTPARTUM: "Is there any chance you are pregnant?" "When was your last menstrual period?" "Did you deliver in the last 2 weeks?"      8. HIGH RISK: "Do you have any heart or lung problems? Do you have a weakened immune system?" (e.g., CHF, COPD, asthma, HIV positive, chemotherapy, renal failure, diabetes mellitus, sickle cell anemia)     Autoimmune issues and on remicade,  fibromylagia  Protocols used: CORONAVIRUS (COVID-19) EXPOSURE-A-AH

## 2018-08-02 NOTE — Telephone Encounter (Signed)
fyi

## 2018-08-09 ENCOUNTER — Encounter: Payer: Self-pay | Admitting: Family Medicine

## 2018-08-09 ENCOUNTER — Telehealth: Payer: Self-pay | Admitting: Family Medicine

## 2018-08-09 NOTE — Telephone Encounter (Signed)
Pt calling to schedule Virtual Appt as advised.  Call transferred to [facility] to assist with securing appt. with Dr. Birdie Riddle as office hours have changed.

## 2018-08-10 DIAGNOSIS — Z79899 Other long term (current) drug therapy: Secondary | ICD-10-CM | POA: Diagnosis not present

## 2018-08-10 DIAGNOSIS — L4050 Arthropathic psoriasis, unspecified: Secondary | ICD-10-CM | POA: Diagnosis not present

## 2018-08-10 DIAGNOSIS — M797 Fibromyalgia: Secondary | ICD-10-CM | POA: Diagnosis not present

## 2018-08-11 ENCOUNTER — Encounter: Payer: Self-pay | Admitting: Family Medicine

## 2018-08-11 ENCOUNTER — Other Ambulatory Visit: Payer: Self-pay

## 2018-08-11 ENCOUNTER — Ambulatory Visit (INDEPENDENT_AMBULATORY_CARE_PROVIDER_SITE_OTHER): Payer: 59 | Admitting: Family Medicine

## 2018-08-11 VITALS — BP 126/88 | HR 88 | Temp 98.1°F | Ht 61.5 in | Wt 189.0 lb

## 2018-08-11 DIAGNOSIS — F419 Anxiety disorder, unspecified: Secondary | ICD-10-CM | POA: Diagnosis not present

## 2018-08-11 DIAGNOSIS — F329 Major depressive disorder, single episode, unspecified: Secondary | ICD-10-CM | POA: Diagnosis not present

## 2018-08-11 MED ORDER — BUPROPION HCL ER (XL) 300 MG PO TB24: 300.0000 mg | ORAL_TABLET | Freq: Every day | ORAL | 3 refills | Status: DC

## 2018-08-11 MED ORDER — ALPRAZOLAM 0.5 MG PO TABS: 0.5000 mg | ORAL_TABLET | Freq: Two times a day (BID) | ORAL | 1 refills | Status: DC | PRN

## 2018-08-11 NOTE — Progress Notes (Signed)
I have discussed the procedure for the virtual visit with the patient who has given consent to proceed with assessment and treatment.   BETHANY DILLARD, CMA     
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
## 2018-08-13 ENCOUNTER — Telehealth: Payer: Self-pay | Admitting: Family Medicine

## 2018-08-13 DIAGNOSIS — M47816 Spondylosis without myelopathy or radiculopathy, lumbar region: Secondary | ICD-10-CM | POA: Diagnosis not present

## 2018-08-13 DIAGNOSIS — G894 Chronic pain syndrome: Secondary | ICD-10-CM | POA: Diagnosis not present

## 2018-08-13 DIAGNOSIS — Z79899 Other long term (current) drug therapy: Secondary | ICD-10-CM | POA: Diagnosis not present

## 2018-08-13 DIAGNOSIS — M797 Fibromyalgia: Secondary | ICD-10-CM | POA: Diagnosis not present

## 2018-08-13 DIAGNOSIS — Z79891 Long term (current) use of opiate analgesic: Secondary | ICD-10-CM | POA: Diagnosis not present

## 2018-08-13 NOTE — Telephone Encounter (Signed)
Copied from [place] [id number]. Topic: General - Other >> Aug 13, 2018  5:19 PM Keene Breath wrote: Reason for CRM: Patient called to request a virtual visit with Dr. Birdie Riddle.  Please call patient back to set up the appointment.  CB# [phone]

## 2018-08-16 NOTE — Telephone Encounter (Signed)
Patient has been scheduled

## 2018-08-18 ENCOUNTER — Encounter: Payer: Self-pay | Admitting: Family Medicine

## 2018-08-23 DIAGNOSIS — L4050 Arthropathic psoriasis, unspecified: Secondary | ICD-10-CM | POA: Diagnosis not present

## 2018-08-31 ENCOUNTER — Ambulatory Visit (INDEPENDENT_AMBULATORY_CARE_PROVIDER_SITE_OTHER): Payer: 59 | Admitting: Family Medicine

## 2018-08-31 ENCOUNTER — Other Ambulatory Visit: Payer: Self-pay

## 2018-08-31 ENCOUNTER — Encounter: Payer: Self-pay | Admitting: Family Medicine

## 2018-08-31 ENCOUNTER — Telehealth: Payer: Self-pay | Admitting: Family Medicine

## 2018-08-31 ENCOUNTER — Other Ambulatory Visit: Payer: Self-pay | Admitting: Family Medicine

## 2018-08-31 VITALS — BP 110/86 | Ht 61.5 in | Wt 190.0 lb

## 2018-08-31 DIAGNOSIS — F329 Major depressive disorder, single episode, unspecified: Secondary | ICD-10-CM | POA: Diagnosis not present

## 2018-08-31 DIAGNOSIS — F419 Anxiety disorder, unspecified: Secondary | ICD-10-CM | POA: Diagnosis not present

## 2018-08-31 MED ORDER — BUSPIRONE HCL 5 MG PO TABS: 5.0000 mg | ORAL_TABLET | Freq: Two times a day (BID) | ORAL | 1 refills | Status: DC

## 2018-08-31 NOTE — Progress Notes (Signed)
Virtual Visit via Video   I connected with patient on 08/31/18 at 10:40 AM EDT by a video enabled telemedicine application and verified that I am speaking with the correct person using two identifiers.  Location patient: Home Location provider: [facility], Office Persons participating in the virtual visit: Patient, Provider, [place] ([place])  I discussed the limitations of evaluation and management by telemedicine and the availability of in person appointments. The patient expressed understanding and agreed to proceed.  Interactive audio and video telecommunications were attempted between this provider and patient, however failed, due to patient having technical difficulties OR patient did not have access to video capability.  We continued and completed visit with audio only.  Subjective:   HPI:   Anxiety- pt stopped the Wellbutrin b/c it wasn't helping.  Pt reports some sadness.  Pt feels that the Wellbutrin was making her feel worse.  Remains on Cymbalta  60mg  daily.  The [employer] situation has been resolved.  ROS:   See pertinent positives and negatives per HPI.  Patient Active Problem List   Diagnosis Date Noted  . Nausea 01/27/2018  . Bariatric surgery status 01/26/2018  . RLS (restless legs syndrome) 01/26/2018  . Morbid obesity ([place]) 01/19/2018  . Vitamin D deficiency 11/10/2017  . Fibromyalgia 11/10/2017  . Anxiety and depression 05/27/2016  . Status post total abdominal hysterectomy 04/08/2016  . Costochondritis 12/04/2015  . Anemia, iron deficiency 10/10/2015  . Tick bites 10/10/2015  . Fatigue 10/10/2015  . Allergic rhinitis 09/07/2015  . Hemorrhoid   . Diverticulosis of colon without hemorrhage   . Hiatal hernia   . Rectal bleeding 05/08/2015  . Abnormal finding on EKG 09/16/2013  . Psoriatic arthritis ([place]) 09/16/2013  . HTN (hypertension) 09/16/2013  . Morbid obesity, unspecified obesity type ([place]) 09/16/2013  . GERD 12/11/2009  . DIARRHEA,  CHRONIC 12/11/2009    Social History   Tobacco Use  . Smoking status: Former Smoker    Packs/day: 0.10    Years: 20.00    Pack years: 2.00    Types: Cigarettes  . Smokeless tobacco: Never Used  . Tobacco comment: stopped smoking 6 months ago   Substance Use Topics  . Alcohol use: Yes    Alcohol/week: 16.0 standard drinks    Types: 6 Glasses of wine, 10 Standard drinks or equivalent per week    Comment: socially, weekends 4-5    Current Outpatient Medications:  .  ALPRAZolam (XANAX) 0.5 MG tablet, Take 1 tablet (0.5 mg total) by mouth 2 (two) times daily as needed for anxiety., Disp: 30 tablet, Rfl: 1 .  betamethasone dipropionate (DIPROLENE) 0.05 % cream, Apply topically 2 (two) times daily., Disp: 45 g, Rfl: 0 .  calcium carbonate (TUMS EX) 750 MG chewable tablet, Chew 1 tablet by mouth 3 (three) times daily., Disp: , Rfl:  .  cyclobenzaprine (FLEXERIL) 10 MG tablet, Take 1 tablet (10 mg total) by mouth 3 (three) times daily as needed for muscle spasms., Disp: 30 tablet, Rfl: 0 .  diclofenac sodium (VOLTAREN) 1 % GEL, Apply 2-4 g topically 4 (four) times daily as needed (for muscle/joint pain.). , Disp: , Rfl: 0 .  DULoxetine (CYMBALTA) 60 MG capsule, TAKE 1 CAPSULE(60 MG) BY MOUTH DAILY, Disp: 30 capsule, Rfl: 3 .  esomeprazole (NEXIUM) 40 MG capsule, TAKE 1 CAPSULE(40 MG) BY MOUTH DAILY BEFORE BREAKFAST (Patient taking differently: Take 40 mg by mouth daily. ), Disp: 30 capsule, Rfl: 11 .  fluticasone (FLONASE) 50 MCG/ACT nasal spray, Place 2  sprays into both nostrils daily., Disp: 16 g, Rfl: 6 .  folic acid (FOLVITE) 1 MG tablet, Take 1 tablet by mouth daily., Disp: , Rfl:  .  furosemide (LASIX) 20 MG tablet, Take 1 tablet by mouth daily., Disp: , Rfl:  .  gabapentin (NEURONTIN) 100 MG capsule, Take 2 capsules (200 mg total) by mouth 3 (three) times daily., Disp: 180 capsule, Rfl: 3 .  gabapentin (NEURONTIN) 600 MG tablet, TAKE 1 TABLET(600 MG) BY MOUTH AT BEDTIME, Disp: 30 tablet,  Rfl: 3 .  hydrochlorothiazide (HYDRODIURIL) 25 MG tablet, TAKE 1 TABLET(25 MG) BY MOUTH DAILY, Disp: 30 tablet, Rfl: 3 .  HYDROcodone-acetaminophen (NORCO) 10-325 MG tablet, Take 1 tablet by mouth 3 (three) times daily as needed (for pain). , Disp: , Rfl: 0 .  inFLIXimab (REMICADE) 100 MG injection, Inject into the vein every 30 (thirty) days., Disp: , Rfl:  .  LINZESS 145 MCG CAPS capsule, Take 1 tablet by mouth daily., Disp: , Rfl:  .  methotrexate (50 MG/ML) 1 g injection, Inject into the vein once a week., Disp: , Rfl:  .  nystatin (MYCOSTATIN) 100000 UNIT/ML suspension, Take 5 mLs (500,000 Units total) by mouth 3 (three) times daily., Disp: 120 mL, Rfl: 0 .  ondansetron (ZOFRAN-ODT) 4 MG disintegrating tablet, Take 1 tablet (4 mg total) by mouth every 6 (six) hours as needed for nausea or vomiting., Disp: 20 tablet, Rfl: 0 .  OVER THE COUNTER MEDICATION, Take 1 tablet by mouth daily. Bariatric Vitamin, Disp: , Rfl:  .  predniSONE (STERAPRED UNI-PAK 48 TAB) 10 MG (48) TBPK tablet, Take 1 tablet by mouth daily., Disp: , Rfl:  .  promethazine (PHENERGAN) 12.5 MG tablet, Take 12.5 mg by mouth every 8 (eight) hours as needed for nausea., Disp: , Rfl: 0 .  XIIDRA 5 % SOLN, Place 1 drop into both eyes daily as needed (dry/irritated eyes.). , Disp: , Rfl: 12 .  buPROPion (WELLBUTRIN XL) 300 MG 24 hr tablet, Take 1 tablet (300 mg total) by mouth daily. ONCE daily in the AM (Patient not taking: Reported on 08/31/2018), Disp: 30 tablet, Rfl: 3  Allergies  Allergen Reactions  . Aspirin Other (See Comments)    Convulsions   . Toradol [Ketorolac Tromethamine] Nausea And Vomiting  . Adhesive [Tape] Itching and Rash    Objective:   BP 110/86   Ht 5' 1.5" (1.562 m)   Wt 190 lb (86.2 kg)   LMP 02/28/2016   BMI 35.32 kg/m   Pt is able to speak clearly, coherently without shortness of breath or increased work of breathing.  Thought process is linear.  Mood is appropriate.   Assessment and Plan:    Anxiety- ongoing.  Pt feels the Wellbutrin worsened her sxs.  Will start Buspar BID and monitor closely for improvement.  Pt expressed understanding and is in agreement w/ plan.    [name], MD 08/31/2018

## 2018-08-31 NOTE — Telephone Encounter (Signed)
Aware. Ok to fill

## 2018-08-31 NOTE — Telephone Encounter (Signed)
Routing to provider to advise.  

## 2018-08-31 NOTE — Telephone Encounter (Signed)
Copied from [place] [id number]. Topic: General - Other >> Aug 31, 2018 11:30 AM Lennox Solders wrote: Reason for CRM: [facility] pharmacist is calling dr Birdie Riddle prescribed buspar and pt is on cymbalta there is drug interaction can cause serotonin syndrome. Please advice

## 2018-08-31 NOTE — Progress Notes (Signed)
I have discussed the procedure for the virtual visit with the patient who has given consent to proceed with assessment and treatment.   BETHANY DILLARD, CMA     

## 2018-08-31 NOTE — Telephone Encounter (Signed)
Called pharmacist and she is aware to fill.

## 2018-09-04 ENCOUNTER — Emergency Department (HOSPITAL_COMMUNITY)
Admission: EM | Admit: 2018-09-04 | Discharge: 2018-09-05 | Disposition: A | Discharge status: Home / Self Care | Payer: 59 | Attending: Emergency Medicine | Admitting: Emergency Medicine

## 2018-09-04 ENCOUNTER — Other Ambulatory Visit: Payer: Self-pay

## 2018-09-04 ENCOUNTER — Encounter (HOSPITAL_COMMUNITY): Payer: Self-pay | Admitting: Emergency Medicine

## 2018-09-04 ENCOUNTER — Emergency Department (HOSPITAL_COMMUNITY): Payer: 59

## 2018-09-04 DIAGNOSIS — R0789 Other chest pain: Secondary | ICD-10-CM | POA: Diagnosis not present

## 2018-09-04 DIAGNOSIS — F17210 Nicotine dependence, cigarettes, uncomplicated: Secondary | ICD-10-CM | POA: Insufficient documentation

## 2018-09-04 DIAGNOSIS — R079 Chest pain, unspecified: Secondary | ICD-10-CM | POA: Diagnosis not present

## 2018-09-04 DIAGNOSIS — Z79899 Other long term (current) drug therapy: Secondary | ICD-10-CM | POA: Diagnosis not present

## 2018-09-04 DIAGNOSIS — I10 Essential (primary) hypertension: Secondary | ICD-10-CM | POA: Insufficient documentation

## 2018-09-04 DIAGNOSIS — R0602 Shortness of breath: Secondary | ICD-10-CM | POA: Diagnosis not present

## 2018-09-04 DIAGNOSIS — R069 Unspecified abnormalities of breathing: Secondary | ICD-10-CM | POA: Diagnosis not present

## 2018-09-04 LAB — CBC
HCT: 44.7 % (ref 36.0–46.0)
Hemoglobin: 15.2 g/dL — ABNORMAL HIGH (ref 12.0–15.0)
MCH: 33 pg (ref 26.0–34.0)
MCHC: 34 g/dL (ref 30.0–36.0)
MCV: 97.2 fL (ref 80.0–100.0)
Platelets: 299 10*3/uL (ref 150–400)
RBC: 4.6 MIL/uL (ref 3.87–5.11)
RDW: 14 % (ref 11.5–15.5)
WBC: 8.2 10*3/uL (ref 4.0–10.5)
nRBC: 0 % (ref 0.0–0.2)

## 2018-09-04 LAB — BASIC METABOLIC PANEL
Anion gap: 11 (ref 5–15)
BUN: 13 mg/dL (ref 6–20)
CO2: 21 mmol/L — ABNORMAL LOW (ref 22–32)
Calcium: 9.5 mg/dL (ref 8.9–10.3)
Chloride: 108 mmol/L (ref 98–111)
Creatinine, Ser: 0.56 mg/dL (ref 0.44–1.00)
GFR calc Af Amer: >60 mL/min (ref >60)
GFR calc non Af Amer: >60 mL/min (ref >60)
Glucose, Bld: 98 mg/dL (ref 70–99)
Potassium: 3.6 mmol/L (ref 3.5–5.1)
Sodium: 140 mmol/L (ref 135–145)

## 2018-09-04 LAB — D-DIMER, QUANTITATIVE: D-Dimer, Quant: <0.27 ug/mL-FEU (ref 0.00–0.50)

## 2018-09-04 LAB — TROPONIN I: Troponin I: <0.03 ng/mL (ref <0.03)

## 2018-09-04 MED ORDER — LORAZEPAM 2 MG/ML IJ SOLN
1.0000 mg | Freq: Once | INTRAMUSCULAR | Status: AC
  Administered 2018-09-04: 1 mg via INTRAMUSCULAR
  Filled 2018-09-04: qty 1

## 2018-09-04 MED ORDER — FENTANYL CITRATE (PF) 100 MCG/2ML IJ SOLN
50.0000 ug | Freq: Once | INTRAMUSCULAR | Status: AC
  Administered 2018-09-04: 50 ug via INTRAMUSCULAR
  Filled 2018-09-04: qty 2

## 2018-09-04 NOTE — Discharge Instructions (Addendum)
Follow-up with your primary provider for recheck.

## 2018-09-04 NOTE — ED Provider Notes (Signed)
[facility] EMERGENCY DEPARTMENT Provider Note   CSN: [id number] Arrival date & time: 09/04/18  2117    History   Chief Complaint Chief Complaint  Patient presents with  . Chest Pain    HPI Dawn Hayes is a 48 y.o. female.     HPI   Dawn Hayes is a 48 y.o. female with PMH of chronic costochondritis, psoriatic arthritis, fibromyalgia and anxiety, presents to the Emergency Department complaining of worsening of her chronic chest pain.  States she was diagnosed with costochondritis in 2017 and has on going pain to her central chest.  Current pain feels similar to previous, but severity has been increasing for 2 days prompting her to come here for evaluation.  She describes pain as sharp and worse with palpation and with lying flat.  Pain has been unrelieved by taking her 10 mg hydrocodone.  She denies fever, cough, diaphoresis, extremity pain and shortness of breath.  Currently taking prednisone   Of note, on review of her medical records, pt had telephone visit with her PCP on 08/31/18 for anxiety, d/c her Wellbutrin and started on Buspar.  This was not mentioned to me by the patient.     Past Medical History:  Diagnosis Date  . Anemia   . Arthritis    psoriatic arthritis  . Costochondritis   . Diverticulitis   . Dysmenorrhea   . Fibromyalgia   . GERD (gastroesophageal reflux disease)   . Headache    Migraines  . History of hiatal hernia   . Hypertension   . Menorrhagia   . Psoriatic arthritis ([place])   . Smoker     Patient Active Problem List   Diagnosis Date Noted  . Nausea 01/27/2018  . Bariatric surgery status 01/26/2018  . RLS (restless legs syndrome) 01/26/2018  . Morbid obesity ([place]) 01/19/2018  . Vitamin D deficiency 11/10/2017  . Fibromyalgia 11/10/2017  . Anxiety and depression 05/27/2016  . Status post total abdominal hysterectomy 04/08/2016  . Costochondritis 12/04/2015  . Anemia, iron deficiency 10/10/2015  . Tick bites 10/10/2015  .  Fatigue 10/10/2015  . Allergic rhinitis 09/07/2015  . Hemorrhoid   . Diverticulosis of colon without hemorrhage   . Hiatal hernia   . Rectal bleeding 05/08/2015  . Abnormal finding on EKG 09/16/2013  . Psoriatic arthritis ([place]) 09/16/2013  . HTN (hypertension) 09/16/2013  . Morbid obesity, unspecified obesity type ([place]) 09/16/2013  . GERD 12/11/2009  . DIARRHEA, CHRONIC 12/11/2009    Past Surgical History:  Procedure Laterality Date  . ABDOMINAL HYSTERECTOMY    . CARPAL TUNNEL RELEASE Left 01/19/2015   Procedure: LEFT CARPAL TUNNEL RELEASE;  Surgeon: Roseanne Kaufman, MD;  Location: [facility];  Service: Orthopedics;  Laterality: Left;  . CHOLECYSTECTOMY    . CHOLECYSTECTOMY, LAPAROSCOPIC  2006  . COLONOSCOPY  1998   normal, [place]  . COLONOSCOPY N/A 05/30/2015   RMR: Minimal anal canal hemorrhoids. colonic diverticulosis. I suspect relatively trivial recent gI Bleed likely related to hemorrhoids. This finding alone would not likely adequatly explain her degree of anemia. she likely has a significent component from menstrual losses. , etc.   . COLONOSCOPY WITH PROPOFOL N/A 03/09/2017   Dr. Gala Romney: scattered diverticula, distal 5cm of TI normal. Grade II non-bleeding internal hemorrhoids. next TCS 10 years.   . CYSTOSCOPY N/A 04/08/2016   Procedure: CYSTOSCOPY;  Surgeon: Nunzio Cobbs, MD;  Location: [facility];  Service: Gynecology;  Laterality: N/A;  . DILATATION & CURETTAGE/HYSTEROSCOPY WITH  MYOSURE N/A 01/04/2016   Procedure: DILATATION & CURETTAGE/HYSTEROSCOPY;  Surgeon: Nunzio Cobbs, MD;  Location: [facility];  Service: Gynecology;  Laterality: N/A;  . ESOPHAGOGASTRODUODENOSCOPY  09/11/99   tiny esophageal erosions with mild erosive reflux/no barrett's/normal stomach  . ESOPHAGOGASTRODUODENOSCOPY N/A 05/30/2015   RMR: Hiatal hernia otherwise negative EGD  . GASTRIC ROUX-EN-Y N/A 01/19/2018   Procedure: LAPAROSCOPIC ROUX-EN-Y GASTRIC BYPASS WITH HIATAL  HERNIA REPAIR, WITH UPPER ENDOSCOPY, ERAS Pathway;  Surgeon: Greer Pickerel, MD;  Location: [facility];  Service: General;  Laterality: N/A;  . GIVENS CAPSULE STUDY N/A 08/14/2017   Procedure: GIVENS CAPSULE STUDY;  Surgeon: Daneil Dolin, MD;  [facility];  Service: Endoscopy;  Laterality: N/A;  7:30am  . HYSTERECTOMY ABDOMINAL WITH SALPINGECTOMY Bilateral 04/08/2016   Procedure: HYSTERECTOMY ABDOMINAL WITH SALPINGECTOMY;  Surgeon: Nunzio Cobbs, MD;  Location: [facility];  Service: Gynecology;  Laterality: Bilateral;  . LAPAROSCOPIC BILATERAL SALPINGECTOMY N/A 04/08/2016   Procedure: LAPAROSCOPIC BILATERAL SALPINGECTOMY possible BSO;  Surgeon: Nunzio Cobbs, MD;  Location: [facility];  Service: Gynecology;  Laterality: N/A;  . LAPAROSCOPIC HYSTERECTOMY N/A 04/08/2016   Procedure: ATTEMPTED HYSTERECTOMY TOTAL LAPAROSCOPIC;  Surgeon: Nunzio Cobbs, MD;  Location: [facility];  Service: Gynecology;  Laterality: N/A;  . LAPAROSCOPIC LYSIS OF ADHESIONS  04/08/2016   Procedure: LAPAROSCOPIC LYSIS OF ADHESIONS;  Surgeon: Nunzio Cobbs, MD;  Location: [facility];  Service: Gynecology;;  . LAPAROSCOPIC LYSIS OF ADHESIONS N/A 01/19/2018   Procedure: LAPAROSCOPIC LYSIS OF ADHESIONS;  Surgeon: Greer Pickerel, MD;  Location: [facility];  Service: General;  Laterality: N/A;     OB History    Gravida  0   Para      Term      Preterm      AB      Living        SAB      TAB      Ectopic      Multiple      Live Births               Home Medications    Prior to Admission medications   Medication Sig Start Date End Date Taking? Authorizing Provider  ALPRAZolam Duanne Moron) 0.5 MG tablet Take 1 tablet (0.5 mg total) by mouth 2 (two) times daily as needed for anxiety. 08/11/18   Midge Minium, MD  betamethasone dipropionate (DIPROLENE) 0.05 % cream Apply topically 2 (two) times daily. 02/04/18   Leamon Arnt, MD  buPROPion (WELLBUTRIN XL) 300 MG 24 hr tablet  Take 1 tablet (300 mg total) by mouth daily. ONCE daily in the AM Patient not taking: Reported on 08/31/2018 08/11/18   Midge Minium, MD  busPIRone (BUSPAR) 5 MG tablet Take 1 tablet (5 mg total) by mouth 2 (two) times daily. 08/31/18   Midge Minium, MD  calcium carbonate (TUMS EX) 750 MG chewable tablet Chew 1 tablet by mouth 3 (three) times daily.    [provider]  cyclobenzaprine (FLEXERIL) 10 MG tablet Take 1 tablet (10 mg total) by mouth 3 (three) times daily as needed for muscle spasms. 06/18/18   Brunetta Jeans, PA-C  diclofenac sodium (VOLTAREN) 1 % GEL Apply 2-4 g topically 4 (four) times daily as needed (for muscle/joint pain.).  09/25/17   [provider]  DULoxetine (CYMBALTA) 60 MG capsule TAKE 1 CAPSULE(60 MG) BY MOUTH DAILY 08/31/18   Midge Minium,  MD  esomeprazole (NEXIUM) 40 MG capsule TAKE 1 CAPSULE(40 MG) BY MOUTH DAILY BEFORE BREAKFAST Patient taking differently: Take 40 mg by mouth daily.  09/26/17   Mahala Menghini, PA-C  fluticasone (FLONASE) 50 MCG/ACT nasal spray Place 2 sprays into both nostrils daily. 03/24/18   Hassell Done Mary-Margaret, FNP  folic acid (FOLVITE) 1 MG tablet Take 1 tablet by mouth daily. 07/07/18   [provider]  furosemide (LASIX) 20 MG tablet Take 1 tablet by mouth daily. 05/18/18   [provider]  gabapentin (NEURONTIN) 100 MG capsule Take 2 capsules (200 mg total) by mouth 3 (three) times daily. 03/24/18   Brunetta Jeans, PA-C  gabapentin (NEURONTIN) 600 MG tablet TAKE 1 TABLET(600 MG) BY MOUTH AT BEDTIME 08/31/18   Midge Minium, MD  hydrochlorothiazide (HYDRODIURIL) 25 MG tablet TAKE 1 TABLET(25 MG) BY MOUTH DAILY 06/02/18   Midge Minium, MD  HYDROcodone-acetaminophen (NORCO) 10-325 MG tablet Take 1 tablet by mouth 3 (three) times daily as needed (for pain).  12/14/17   [provider]  inFLIXimab (REMICADE) 100 MG injection Inject into the vein every 30 (thirty) days.    [provider]  LINZESS 145 MCG CAPS capsule Take 1 tablet by mouth daily. 05/18/18   [provider]  methotrexate (50 MG/ML) 1 g injection Inject into the vein once a week.    [provider]  nystatin (MYCOSTATIN) 100000 UNIT/ML suspension Take 5 mLs (500,000 Units total) by mouth 3 (three) times daily. 07/12/18   Midge Minium, MD  ondansetron (ZOFRAN-ODT) 4 MG disintegrating tablet Take 1 tablet (4 mg total) by mouth every 6 (six) hours as needed for nausea or vomiting. 01/21/18   Greer Pickerel, MD  OVER THE COUNTER MEDICATION Take 1 tablet by mouth daily. Bariatric Vitamin    [provider]  predniSONE (STERAPRED UNI-PAK 48 TAB) 10 MG (48) TBPK tablet Take 1 tablet by mouth daily. 06/22/18   [provider]  promethazine (PHENERGAN) 12.5 MG tablet Take 12.5 mg by mouth every 8 (eight) hours as needed for nausea. 02/11/18   [provider]  XIIDRA 5 % SOLN Place 1 drop into both eyes daily as needed (dry/irritated eyes.).  10/10/16   [provider]    Family History Family History  Problem Relation Age of Onset  . Brain cancer Father   . Prostate cancer Father   . Hyperlipidemia Mother   . Colon cancer Maternal Grandmother 28  . Stroke Paternal Grandfather        COD  . Aneurysm Maternal Grandfather   . Cancer Other   . Hypertension Other   . Other Paternal Uncle        gastric polyps    Social History Social History   Tobacco Use  . Smoking status: Light Tobacco Smoker    Packs/day: 0.10    Years: 20.00    Pack years: 2.00    Types: Cigarettes  . Smokeless tobacco: Never Used  . Tobacco comment: stopped smoking 6 months ago   Substance Use Topics  . Alcohol use: Yes    Alcohol/week: 16.0 standard drinks    Types: 6 Glasses of wine, 10 Standard drinks or equivalent per week    Comment: socially, weekends 4-5  . Drug use: No     Allergies   Aspirin; Toradol [ketorolac tromethamine]; and Adhesive [tape]   Review  of Systems Review of Systems  Constitutional: Negative for appetite change, fatigue and fever.  HENT: Negative  for congestion, sore throat and trouble swallowing.   Respiratory: Negative for cough and shortness of breath.   Cardiovascular: Positive for chest pain. Negative for leg swelling.  Gastrointestinal: Negative for abdominal pain, diarrhea, nausea and vomiting.  Genitourinary: Negative for decreased urine volume and dysuria.  Musculoskeletal: Negative for back pain and neck pain.  Skin: Negative for rash.  Neurological: Negative for dizziness and weakness.     Physical Exam Updated Vital Signs BP (!) 134/92   Pulse 98   Temp 98.3 F (36.8 C) (Oral)   Resp (!) 24   Ht 5' 1.5" (1.562 m)   Wt 86.1 kg   LMP 02/28/2016   SpO2 97%   BMI 35.28 kg/m   Physical Exam Vitals signs and nursing note reviewed.  Constitutional:      Comments: Pt is tearful and anxious  HENT:     Head: Atraumatic.     Mouth/Throat:     Mouth: Mucous membranes are moist.  Eyes:     Extraocular Movements: Extraocular movements intact.     Pupils: Pupils are equal, round, and reactive to light.  Neck:     Musculoskeletal: Normal range of motion.  Cardiovascular:     Rate and Rhythm: Normal rate and regular rhythm.     Pulses: Normal pulses.  Pulmonary:     Effort: Pulmonary effort is normal.     Breath sounds: Normal breath sounds.  Chest:     Chest wall: Tenderness present.  Abdominal:     Palpations: Abdomen is soft.     Tenderness: There is no abdominal tenderness. There is no guarding.  Musculoskeletal: Normal range of motion.     Right lower leg: No edema.     Left lower leg: No edema.  Skin:    General: Skin is warm.     Findings: No rash.  Neurological:     General: No focal deficit present.     Mental Status: She is alert.     Sensory: No sensory deficit.     Motor: No weakness.     Comments: CN II-XII grossly intact.  Speech clear.  mentating well      ED Treatments /  Results  Labs (all labs ordered are listed, but only abnormal results are displayed) Labs Reviewed  BASIC METABOLIC PANEL - Abnormal; Notable for the following components:      Result Value   CO2 21 (*)    All other components within normal limits  CBC - Abnormal; Notable for the following components:   Hemoglobin 15.2 (*)    All other components within normal limits  TROPONIN I  D-DIMER, QUANTITATIVE (NOT AT [facility])    EKG EKG Interpretation  Date/Time:  Saturday September 04 2018 21:26:26 EDT Ventricular Rate:  93 PR Interval:    QRS Duration: 86 QT Interval:  344 QTC Calculation: 426 R Axis:   -42 Text Interpretation:  Sinus rhythm Ventricular premature complex Left axis deviation Low voltage, precordial leads Baseline wander in lead(s) III aVF Artifact No significant change since last tracing Confirmed by Fredia Sorrow [phone]) on 09/04/2018 9:46:30 PM   Radiology Dg Chest 2 View  Result Date: 09/04/2018 CLINICAL DATA:  Chest pain EXAM: CHEST - 2 VIEW COMPARISON:  05/12/2018 FINDINGS: Heart and mediastinal contours are within normal limits. No focal opacities or effusions. No acute bony abnormality. IMPRESSION: No active cardiopulmonary disease. Electronically Signed   By: Rolm Baptise M.D.   On: 09/04/2018 22:48    Procedures Procedures (including critical  care time)  Medications Ordered in ED Medications  fentaNYL (SUBLIMAZE) injection 50 mcg (50 mcg Intramuscular Given 09/04/18 2202)     Initial Impression / Assessment and Plan / ED Course  I have reviewed the triage vital signs and the nursing notes.  Pertinent labs & imaging results that were available during my care of the patient were reviewed by me and considered in my medical decision making (see chart for details).        When asked about the recent medication change, she states she has not started the Buspar yet.  Chest pain is reproducible with palpation, doubt ACS and PE as work is reassuring.  She is  requesting IV steroid, but has taken 60 mg yesterday and today.  I do not feel that additional steroid is indicated.  Pt had minimal relief of chest pain with the Fentanyl, will give IM ativan.  She has hydrocodone at home.  Pt appropriate for d/c home, agrees to plan and PCP f/u.    Final Clinical Impressions(s) / ED Diagnoses   Final diagnoses:  Chest wall pain    ED Discharge Orders    None       Kem Parkinson, PA-C 09/04/18 2347    Fredia Sorrow, MD 09/05/18 [phone]

## 2018-09-04 NOTE — ED Triage Notes (Signed)
Dx'd with costochondritis in 4/17  Worse CP in the last 2 days   Smoker  Here due to increased pain

## 2018-09-04 NOTE — ED Triage Notes (Signed)
Pt moaning and moving  Difficult to hold still for EKG  Standing orders initiated

## 2018-09-09 DIAGNOSIS — M47816 Spondylosis without myelopathy or radiculopathy, lumbar region: Secondary | ICD-10-CM | POA: Diagnosis not present

## 2018-09-09 DIAGNOSIS — G894 Chronic pain syndrome: Secondary | ICD-10-CM | POA: Diagnosis not present

## 2018-09-09 DIAGNOSIS — M797 Fibromyalgia: Secondary | ICD-10-CM | POA: Diagnosis not present

## 2018-09-20 ENCOUNTER — Ambulatory Visit: Payer: 59 | Admitting: Family Medicine

## 2018-09-23 IMAGING — CT CT ANGIO CHEST
2 of 8 series · 19 of 46 positions shown · IV contrast (OMNI)
Comparison: 04/16/2016

CLINICAL DATA: Chest pain.

EXAM:
CT ANGIOGRAPHY CHEST WITH CONTRAST
TECHNIQUE: Multidetector CT imaging of the chest was performed using the
standard protocol during bolus administration of intravenous
contrast. Multiplanar CT image reconstructions and MIPs were
obtained to evaluate the vascular anatomy.
CONTRAST:  100 mL Isovue 370 IV

[Series 6: thins · axial · 0.77mm/px · z∈[-33,+211]mm · 16 of 270 slices shown]
[im 13/270  lung]
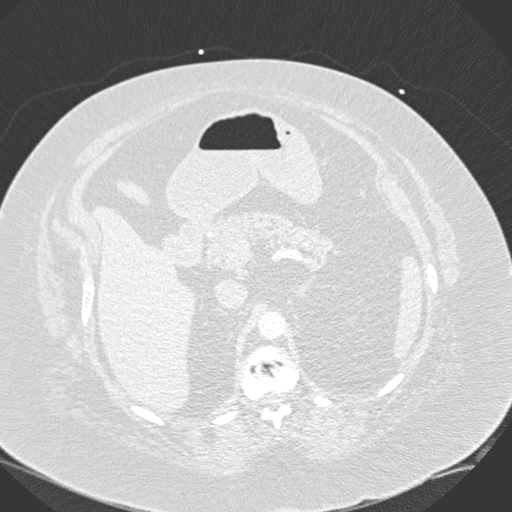
[im 25/270  soft-tissue]
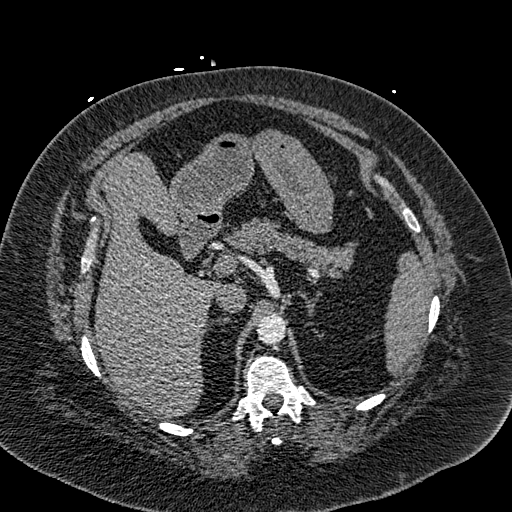
[im 49/270  lung]
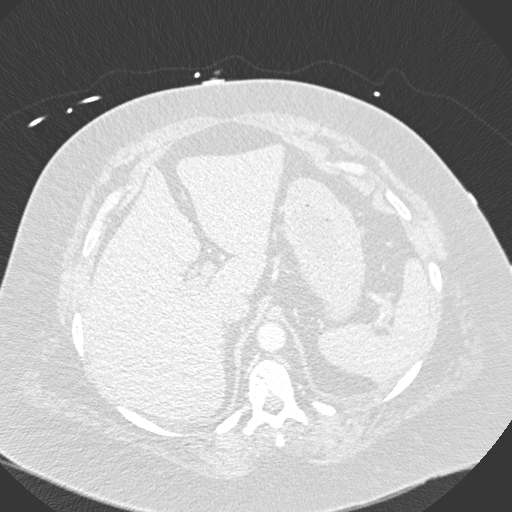
[im 62/270  soft-tissue]
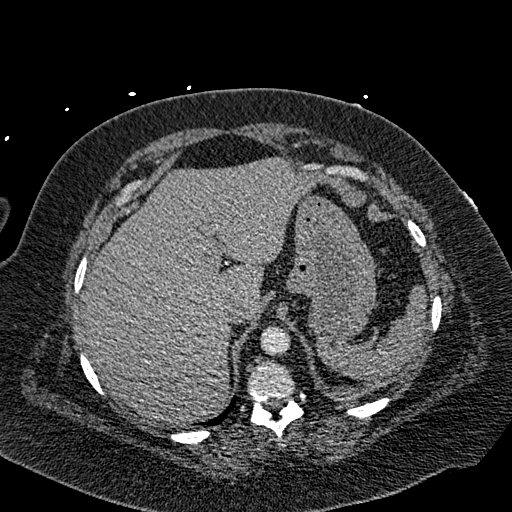
[im 74/270  lung]
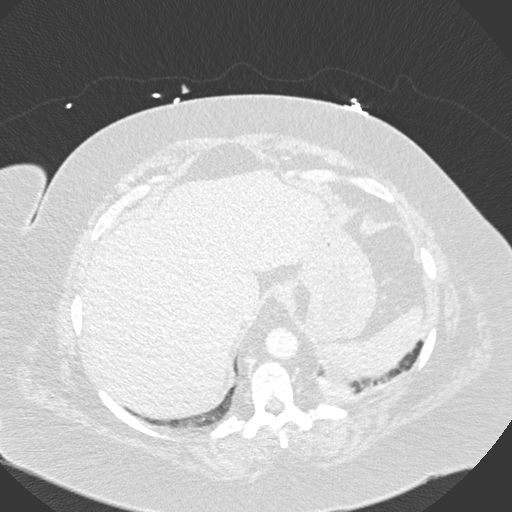
[im 98/270  soft-tissue]
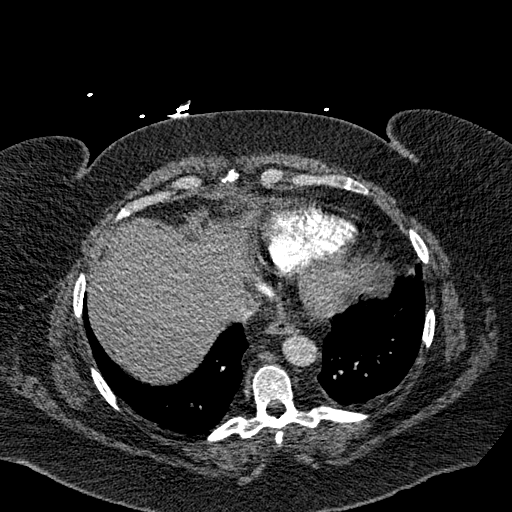
[im 111/270  lung]
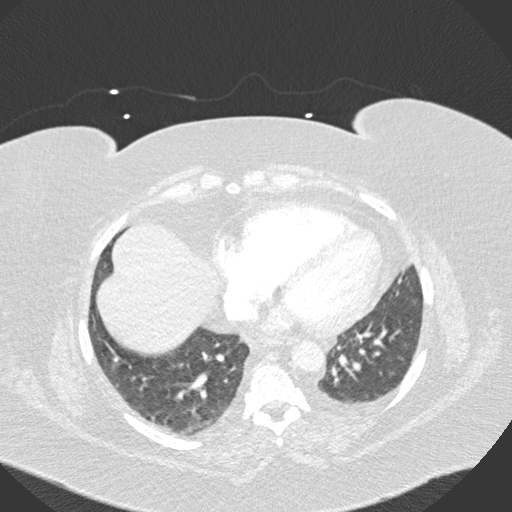
[im 123/270  soft-tissue]
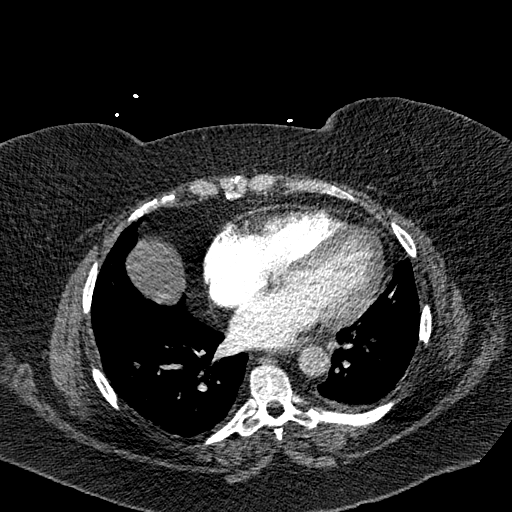
[im 147/270  lung]
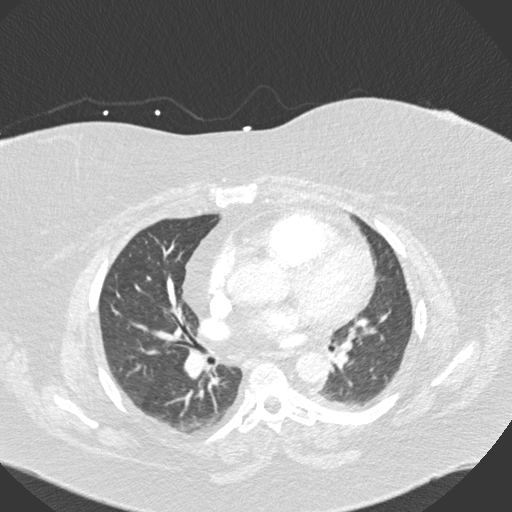
[im 159/270  soft-tissue]
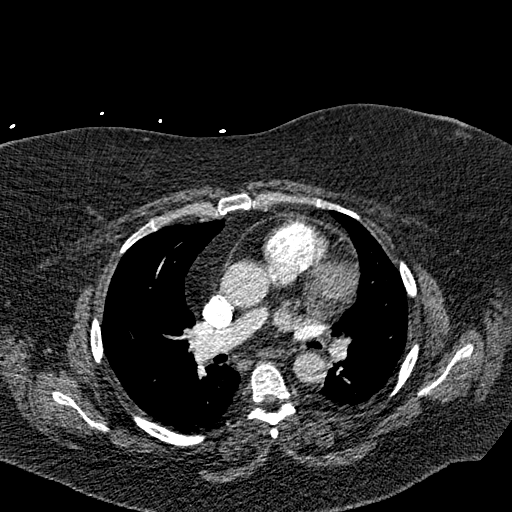
[im 172/270  lung]
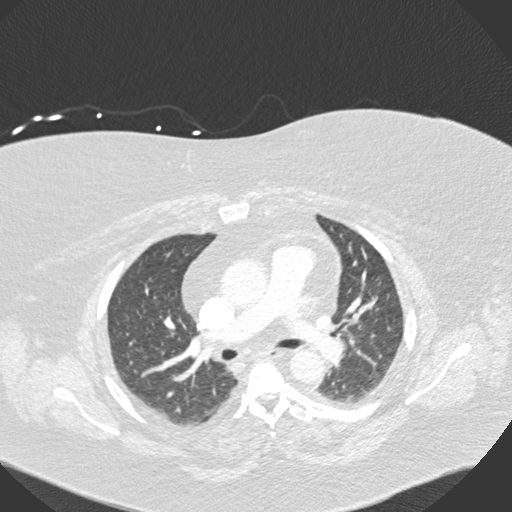
[im 196/270  soft-tissue]
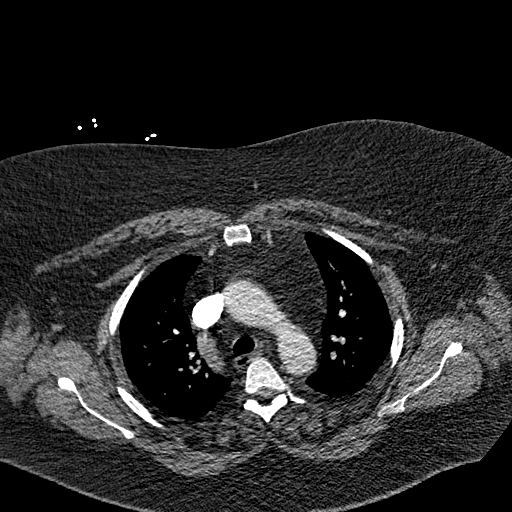
[im 208/270  lung]
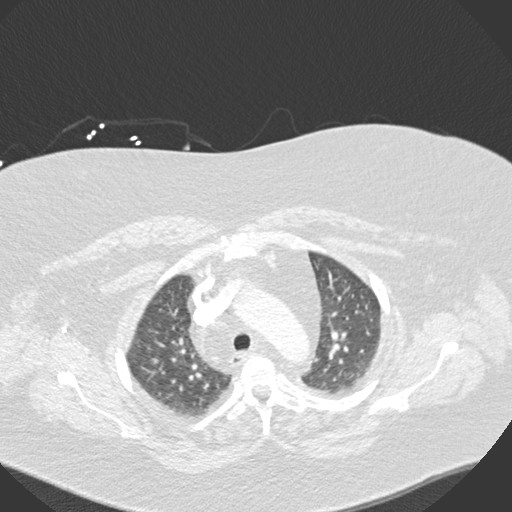
[im 221/270  soft-tissue]
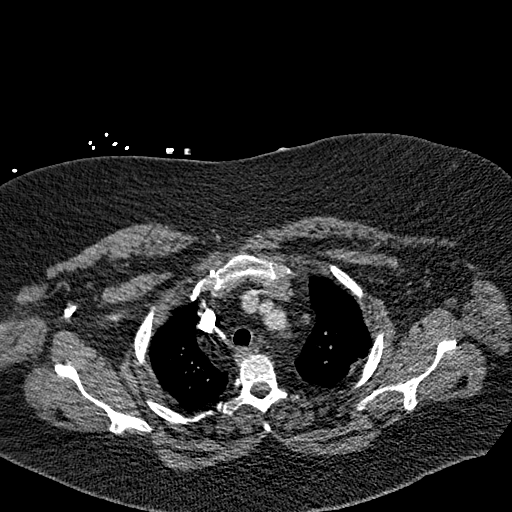
[im 245/270  lung]
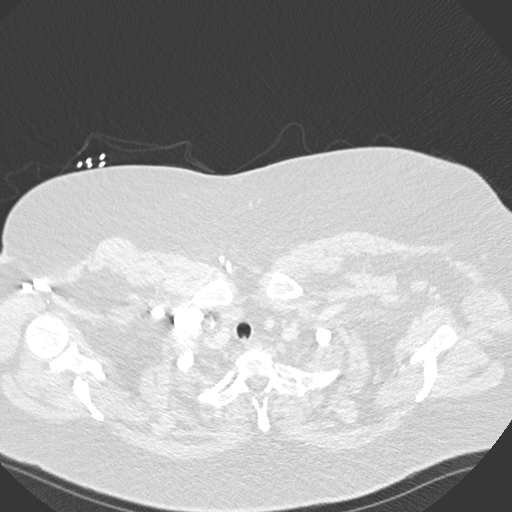
[im 257/270  soft-tissue]
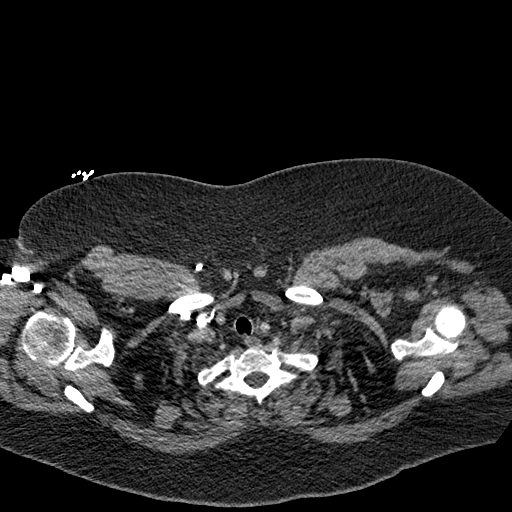

[Series 8: coronal mpr · coronal · 0.54mm/px · 3 of 151 slices shown]
[im 38/151  soft-tissue]
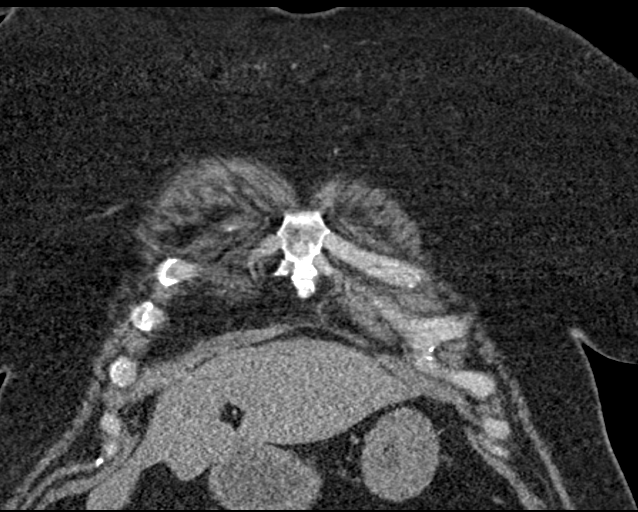
[im 76/151  soft-tissue]
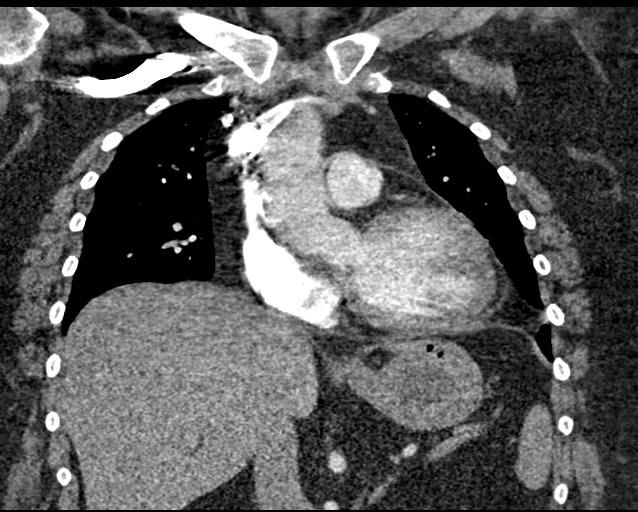
[im 113/151  soft-tissue]
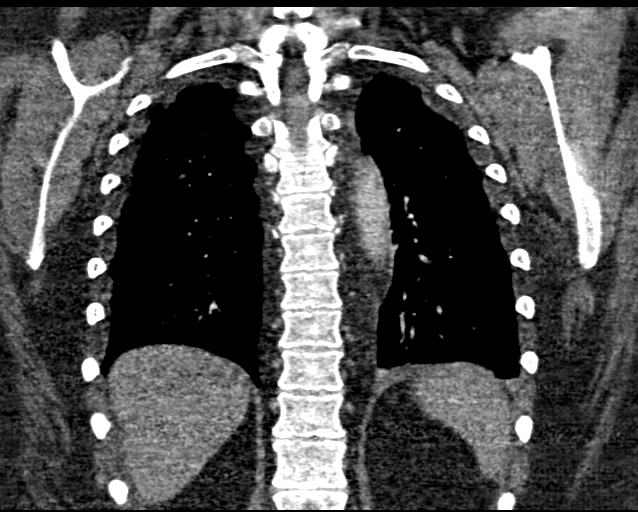

[19 of 46 positions shown; findings below may reference images not displayed]

FINDINGS: Cardiovascular: Borderline cardiomegaly. Thoracic aorta is within
normal. No evidence of pulmonary embolism.

Mediastinum/Nodes: No mediastinal or hilar adenopathy. Remaining
mediastinal structures are within normal.

Lungs/Pleura: Lungs are adequately inflated without focal airspace
consolidation or effusion. Subtle atelectasis over the left base.
Airways are normal.

Upper Abdomen: Previous cholecystectomy.

Musculoskeletal: Minimal degenerative change of the spine.

Review of the MIP images confirms the above findings.
IMPRESSION: No evidence of pulmonary embolism. No acute cardiopulmonary disease.

Borderline cardiomegaly.

## 2018-10-06 ENCOUNTER — Encounter (HOSPITAL_COMMUNITY): Payer: Self-pay | Admitting: Emergency Medicine

## 2018-10-06 ENCOUNTER — Emergency Department (HOSPITAL_COMMUNITY)
Admission: EM | Admit: 2018-10-06 | Discharge: 2018-10-06 | Disposition: A | Discharge status: Home / Self Care | Payer: 59 | Attending: Emergency Medicine | Admitting: Emergency Medicine

## 2018-10-06 ENCOUNTER — Emergency Department (HOSPITAL_COMMUNITY): Payer: 59

## 2018-10-06 ENCOUNTER — Other Ambulatory Visit: Payer: Self-pay

## 2018-10-06 DIAGNOSIS — Z79899 Other long term (current) drug therapy: Secondary | ICD-10-CM | POA: Diagnosis not present

## 2018-10-06 DIAGNOSIS — R079 Chest pain, unspecified: Secondary | ICD-10-CM | POA: Insufficient documentation

## 2018-10-06 DIAGNOSIS — I10 Essential (primary) hypertension: Secondary | ICD-10-CM | POA: Insufficient documentation

## 2018-10-06 DIAGNOSIS — F17210 Nicotine dependence, cigarettes, uncomplicated: Secondary | ICD-10-CM | POA: Diagnosis not present

## 2018-10-06 DIAGNOSIS — L4050 Arthropathic psoriasis, unspecified: Secondary | ICD-10-CM | POA: Diagnosis not present

## 2018-10-06 DIAGNOSIS — R0602 Shortness of breath: Secondary | ICD-10-CM | POA: Diagnosis present

## 2018-10-06 LAB — CBC WITH DIFFERENTIAL/PLATELET
Abs Immature Granulocytes: 0.06 10*3/uL (ref 0.00–0.07)
Basophils Absolute: 0.1 10*3/uL (ref 0.0–0.1)
Basophils Relative: 0 %
Eosinophils Absolute: 0.1 10*3/uL (ref 0.0–0.5)
Eosinophils Relative: 1 %
HCT: 43.4 % (ref 36.0–46.0)
Hemoglobin: 14.9 g/dL (ref 12.0–15.0)
Immature Granulocytes: 0 %
Lymphocytes Relative: 34 %
Lymphs Abs: 4.9 10*3/uL — ABNORMAL HIGH (ref 0.7–4.0)
MCH: 33.4 pg (ref 26.0–34.0)
MCHC: 34.3 g/dL (ref 30.0–36.0)
MCV: 97.3 fL (ref 80.0–100.0)
Monocytes Absolute: 1.3 10*3/uL — ABNORMAL HIGH (ref 0.1–1.0)
Monocytes Relative: 9 %
Neutro Abs: 8.1 10*3/uL — ABNORMAL HIGH (ref 1.7–7.7)
Neutrophils Relative %: 56 %
Platelets: 305 10*3/uL (ref 150–400)
RBC: 4.46 MIL/uL (ref 3.87–5.11)
RDW: 14.4 % (ref 11.5–15.5)
WBC: 14.6 10*3/uL — ABNORMAL HIGH (ref 4.0–10.5)
nRBC: 0 % (ref 0.0–0.2)

## 2018-10-06 LAB — BASIC METABOLIC PANEL
Anion gap: 12 (ref 5–15)
BUN: 15 mg/dL (ref 6–20)
CO2: 23 mmol/L (ref 22–32)
Calcium: 9.3 mg/dL (ref 8.9–10.3)
Chloride: 102 mmol/L (ref 98–111)
Creatinine, Ser: 0.66 mg/dL (ref 0.44–1.00)
GFR calc Af Amer: >60 mL/min (ref >60)
GFR calc non Af Amer: >60 mL/min (ref >60)
Glucose, Bld: 57 mg/dL — ABNORMAL LOW (ref 70–99)
Potassium: 3.1 mmol/L — ABNORMAL LOW (ref 3.5–5.1)
Sodium: 137 mmol/L (ref 135–145)

## 2018-10-06 LAB — TROPONIN I: Troponin I: <0.03 ng/mL (ref <0.03)

## 2018-10-06 MED ORDER — HYDROMORPHONE HCL 1 MG/ML IJ SOLN
1.0000 mg | Freq: Once | INTRAMUSCULAR | Status: AC
  Administered 2018-10-06: 1 mg via INTRAVENOUS
  Filled 2018-10-06: qty 1

## 2018-10-06 MED ORDER — LORAZEPAM 2 MG/ML IJ SOLN
1.0000 mg | Freq: Once | INTRAMUSCULAR | Status: AC
  Administered 2018-10-06: 18:00:00 1 mg via INTRAVENOUS
  Filled 2018-10-06: qty 1

## 2018-10-06 MED ORDER — HYDROMORPHONE HCL 1 MG/ML IJ SOLN
1.0000 mg | Freq: Once | INTRAMUSCULAR | Status: AC
  Administered 2018-10-06: 18:00:00 1 mg via INTRAVENOUS
  Filled 2018-10-06: qty 1

## 2018-10-06 NOTE — ED Triage Notes (Signed)
Pt states that she has been sob today because it hurts to breath.

## 2018-10-07 DIAGNOSIS — G894 Chronic pain syndrome: Secondary | ICD-10-CM | POA: Diagnosis not present

## 2018-10-07 DIAGNOSIS — M797 Fibromyalgia: Secondary | ICD-10-CM | POA: Diagnosis not present

## 2018-10-07 DIAGNOSIS — L4050 Arthropathic psoriasis, unspecified: Secondary | ICD-10-CM | POA: Diagnosis not present

## 2018-10-07 NOTE — ED Provider Notes (Signed)
[facility] EMERGENCY DEPARTMENT Provider Note   CSN: [id number] Arrival date & time: 10/06/18  1540    History   Chief Complaint Chief Complaint  Patient presents with   Shortness of Breath    HPI Dawn Hayes is a 48 y.o. female.     HPI   47yF with CP. Hx of costochondritis and says she feels like her pain is from this. Anterior/right chest. Constant. Worse with certain movements and deep breathing. Feels sob but not sure if this is from the pain or not. No fever or chills. No unusual leg pain or swelling. Hx of chronic pain. Just ran out of her pain medication.   Past Medical History:  Diagnosis Date   Anemia    Arthritis    psoriatic arthritis   Costochondritis    Diverticulitis    Dysmenorrhea    Fibromyalgia    GERD (gastroesophageal reflux disease)    Headache    Migraines   History of hiatal hernia    Hypertension    Menorrhagia    Psoriatic arthritis ([place])    Smoker     Patient Active Problem List   Diagnosis Date Noted   Nausea 01/27/2018   Bariatric surgery status 01/26/2018   RLS (restless legs syndrome) 01/26/2018   Morbid obesity ([place]) 01/19/2018   Vitamin D deficiency 11/10/2017   Fibromyalgia 11/10/2017   Anxiety and depression 05/27/2016   Status post total abdominal hysterectomy 04/08/2016   Costochondritis 12/04/2015   Anemia, iron deficiency 10/10/2015   Tick bites 10/10/2015   Fatigue 10/10/2015   Allergic rhinitis 09/07/2015   Hemorrhoid    Diverticulosis of colon without hemorrhage    Hiatal hernia    Rectal bleeding 05/08/2015   Abnormal finding on EKG 09/16/2013   Psoriatic arthritis ([place]) 09/16/2013   HTN (hypertension) 09/16/2013   Morbid obesity, unspecified obesity type ([place]) 09/16/2013   GERD 12/11/2009   DIARRHEA, CHRONIC 12/11/2009    Past Surgical History:  Procedure Laterality Date   ABDOMINAL HYSTERECTOMY     CARPAL TUNNEL RELEASE Left 01/19/2015   Procedure: LEFT  CARPAL TUNNEL RELEASE;  Surgeon: Roseanne Kaufman, MD;  Location: [place];  Service: Orthopedics;  Laterality: Left;   CHOLECYSTECTOMY     CHOLECYSTECTOMY, LAPAROSCOPIC  2006   COLONOSCOPY  1998   normal, [place]   COLONOSCOPY N/A 05/30/2015   RMR: Minimal anal canal hemorrhoids. colonic diverticulosis. I suspect relatively trivial recent gI Bleed likely related to hemorrhoids. This finding alone would not likely adequatly explain her degree of anemia. she likely has a significent component from menstrual losses. , etc.    COLONOSCOPY WITH PROPOFOL N/A 03/09/2017   Dr. Gala Romney: scattered diverticula, distal 5cm of TI normal. Grade II non-bleeding internal hemorrhoids. next TCS 10 years.    CYSTOSCOPY N/A 04/08/2016   Procedure: CYSTOSCOPY;  Surgeon: Nunzio Cobbs, MD;  Location: [facility];  Service: Gynecology;  Laterality: N/A;   DILATATION & CURETTAGE/HYSTEROSCOPY WITH MYOSURE N/A 01/04/2016   Procedure: DILATATION & CURETTAGE/HYSTEROSCOPY;  Surgeon: Nunzio Cobbs, MD;  Location: [facility];  Service: Gynecology;  Laterality: N/A;   ESOPHAGOGASTRODUODENOSCOPY  09/11/99   tiny esophageal erosions with mild erosive reflux/no barrett's/normal stomach   ESOPHAGOGASTRODUODENOSCOPY N/A 05/30/2015   RMR: Hiatal hernia otherwise negative EGD   GASTRIC ROUX-EN-Y N/A 01/19/2018   Procedure: LAPAROSCOPIC ROUX-EN-Y GASTRIC BYPASS WITH HIATAL HERNIA REPAIR, WITH UPPER ENDOSCOPY, ERAS Pathway;  Surgeon: Greer Pickerel, MD;  Location: [facility];  Service: General;  Laterality: N/A;   GIVENS CAPSULE STUDY N/A 08/14/2017   Procedure: GIVENS CAPSULE STUDY;  Surgeon: Daneil Dolin, MD;  [facility];  Service: Endoscopy;  Laterality: N/A;  7:30am   HYSTERECTOMY ABDOMINAL WITH SALPINGECTOMY Bilateral 04/08/2016   Procedure: HYSTERECTOMY ABDOMINAL WITH SALPINGECTOMY;  Surgeon: Nunzio Cobbs, MD;  Location: [facility];  Service: Gynecology;  Laterality: Bilateral;    LAPAROSCOPIC BILATERAL SALPINGECTOMY N/A 04/08/2016   Procedure: LAPAROSCOPIC BILATERAL SALPINGECTOMY possible BSO;  Surgeon: Nunzio Cobbs, MD;  Location: [facility];  Service: Gynecology;  Laterality: N/A;   LAPAROSCOPIC HYSTERECTOMY N/A 04/08/2016   Procedure: ATTEMPTED HYSTERECTOMY TOTAL LAPAROSCOPIC;  Surgeon: Nunzio Cobbs, MD;  Location: [facility];  Service: Gynecology;  Laterality: N/A;   LAPAROSCOPIC LYSIS OF ADHESIONS  04/08/2016   Procedure: LAPAROSCOPIC LYSIS OF ADHESIONS;  Surgeon: Nunzio Cobbs, MD;  Location: [facility];  Service: Gynecology;;   LAPAROSCOPIC LYSIS OF ADHESIONS N/A 01/19/2018   Procedure: LAPAROSCOPIC LYSIS OF ADHESIONS;  Surgeon: Greer Pickerel, MD;  Location: [facility];  Service: General;  Laterality: N/A;     OB History    Gravida  0   Para      Term      Preterm      AB      Living        SAB      TAB      Ectopic      Multiple      Live Births               Home Medications    Prior to Admission medications   Medication Sig Start Date End Date Taking? Authorizing Provider  ALPRAZolam Duanne Moron) 0.5 MG tablet Take 1 tablet (0.5 mg total) by mouth 2 (two) times daily as needed for anxiety. 08/11/18   Midge Minium, MD  busPIRone (BUSPAR) 5 MG tablet Take 1 tablet (5 mg total) by mouth 2 (two) times daily. 08/31/18   Midge Minium, MD  diclofenac sodium (VOLTAREN) 1 % GEL Apply 2-4 g topically 4 (four) times daily as needed (for muscle/joint pain.).  09/25/17   [provider]  folic acid (FOLVITE) 1 MG tablet Take 1 tablet by mouth daily. 07/07/18   [provider]  gabapentin (NEURONTIN) 600 MG tablet TAKE 1 TABLET(600 MG) BY MOUTH AT BEDTIME Patient taking differently: Take 600 mg by mouth at bedtime.  08/31/18   Midge Minium, MD  inFLIXimab (REMICADE) 100 MG injection Inject into the vein every 6 (six) weeks.     [provider]  methotrexate (50 MG/ML) 1 g injection  Inject into the vein once a week.    [provider]  nystatin (MYCOSTATIN) 100000 UNIT/ML suspension Take 5 mLs (500,000 Units total) by mouth 3 (three) times daily. Patient taking differently: Take 5 mLs by mouth 3 (three) times daily as needed (for thrush).  07/12/18   Midge Minium, MD  OVER THE COUNTER MEDICATION Take 1 tablet by mouth daily. Bariatric Vitamin    [provider]  XIIDRA 5 % SOLN Place 1 drop into both eyes daily as needed (dry/irritated eyes.).  10/10/16   [provider]    Family History Family History  Problem Relation Age of Onset   Brain cancer Father    Prostate cancer Father    Hyperlipidemia Mother    Colon cancer Maternal Grandmother 28   Stroke Paternal Grandfather  COD   Aneurysm Maternal Grandfather    Cancer Other    Hypertension Other    Other Paternal Uncle        gastric polyps    Social History Social History   Tobacco Use   Smoking status: Light Tobacco Smoker    Packs/day: 0.10    Years: 20.00    Pack years: 2.00    Types: Cigarettes   Smokeless tobacco: Never Used   Tobacco comment: stopped smoking 6 months ago   Substance Use Topics   Alcohol use: Yes    Alcohol/week: 16.0 standard drinks    Types: 6 Glasses of wine, 10 Standard drinks or equivalent per week    Comment: socially, weekends 4-5   Drug use: No     Allergies   Aspirin; Toradol [ketorolac tromethamine]; and Adhesive [tape]   Review of Systems Review of Systems  All systems reviewed and negative, other than as noted in HPI.  Physical Exam Updated Vital Signs BP 122/86    Pulse 78    Temp 98.2 F (36.8 C)    Resp (!) 22    Ht 5' 4.5" (1.638 m)    Wt 85.3 kg    LMP 02/28/2016    SpO2 97%    BMI 31.77 kg/m   Physical Exam Vitals signs and nursing note reviewed.  Constitutional:      General: She is not in acute distress.    Appearance: She is well-developed.  HENT:     Head: Normocephalic and atraumatic.    Eyes:     General:        Right eye: No discharge.        Left eye: No discharge.     Conjunctiva/sclera: Conjunctivae normal.  Neck:     Musculoskeletal: Neck supple.  Cardiovascular:     Rate and Rhythm: Normal rate and regular rhythm.     Heart sounds: Normal heart sounds. No murmur. No friction rub. No gallop.   Pulmonary:     Effort: Pulmonary effort is normal. No respiratory distress.     Breath sounds: Normal breath sounds.  Abdominal:     General: There is no distension.     Palpations: Abdomen is soft.     Tenderness: There is no abdominal tenderness.  Musculoskeletal:        General: No tenderness.     Comments: Lower extremities symmetric as compared to each other. No calf tenderness. Negative Homan's. No palpable cords.   Skin:    General: Skin is warm and dry.  Neurological:     Mental Status: She is alert.  Psychiatric:        Mood and Affect: Mood is anxious.        Behavior: Behavior normal.        Thought Content: Thought content normal.      ED Treatments / Results  Labs (all labs ordered are listed, but only abnormal results are displayed) Labs Reviewed  CBC WITH DIFFERENTIAL/PLATELET - Abnormal; Notable for the following components:      Result Value   WBC 14.6 (*)    Neutro Abs 8.1 (*)    Lymphs Abs 4.9 (*)    Monocytes Absolute 1.3 (*)    All other components within normal limits  BASIC METABOLIC PANEL - Abnormal; Notable for the following components:   Potassium 3.1 (*)    Glucose, Bld 57 (*)    All other components within normal limits  TROPONIN I    EKG  EKG Interpretation  Date/Time:  Wednesday Oct 06 2018 15:58:03 EDT Ventricular Rate:  111 PR Interval:    QRS Duration: 80 QT Interval:  322 QTC Calculation: 438 R Axis:   -19 Text Interpretation:  Sinus tachycardia Borderline left axis deviation Low voltage, precordial leads Since last tracing rate faster Confirmed by Wandra Arthurs [phone]) on 10/07/2018 1:28:01  PM   Radiology Dg Chest Portable 1 View  Result Date: 10/06/2018 CLINICAL DATA:  Chest pain, shortness of breath. EXAM: PORTABLE CHEST 1 VIEW COMPARISON:  Radiographs of September 04, 2018. FINDINGS: The heart size and mediastinal contours are within normal limits. Both lungs are clear. No pneumothorax or pleural effusion is noted. The visualized skeletal structures are unremarkable. IMPRESSION: No active disease. Electronically Signed   By: Marijo Conception M.D.   On: 10/06/2018 16:40    Procedures Procedures (including critical care time)  Medications Ordered in ED Medications  HYDROmorphone (DILAUDID) injection 1 mg (1 mg Intravenous Given 10/06/18 1651)  HYDROmorphone (DILAUDID) injection 1 mg (1 mg Intravenous Given 10/06/18 1817)  LORazepam (ATIVAN) injection 1 mg (1 mg Intravenous Given 10/06/18 1818)     Initial Impression / Assessment and Plan / ED Course  I have reviewed the triage vital signs and the nursing notes.  Pertinent labs & imaging results that were available during my care of the patient were reviewed by me and considered in my medical decision making (see chart for details).     47yF with CP. I'm not sure this is actually costochondritis, but I also have a low suspicion for an emergent process. Felling better after meds. ED w/o fairly unrevealing. I doubt PE, dissection or other emergent process. Needs to FU with normal prescriber for additional meds. Return precautions discussed.   Final Clinical Impressions(s) / ED Diagnoses   Final diagnoses:  Chest pain, unspecified type    ED Discharge Orders    None       Virgel Manifold, MD 10/07/18 1740

## 2018-10-11 ENCOUNTER — Other Ambulatory Visit: Payer: Self-pay | Admitting: Family Medicine

## 2018-10-11 ENCOUNTER — Encounter: Payer: Self-pay | Admitting: Family Medicine

## 2018-10-11 MED ORDER — ONDANSETRON 4 MG PO TBDP: 4.0000 mg | ORAL_TABLET | Freq: Three times a day (TID) | ORAL | 0 refills | Status: DC | PRN

## 2018-10-11 NOTE — Progress Notes (Signed)
Prescription sent at pt request

## 2018-10-19 ENCOUNTER — Telehealth: Payer: Self-pay | Admitting: Family Medicine

## 2018-10-19 DIAGNOSIS — M545 Low back pain, unspecified: Secondary | ICD-10-CM

## 2018-10-19 DIAGNOSIS — L4050 Arthropathic psoriasis, unspecified: Secondary | ICD-10-CM

## 2018-10-19 DIAGNOSIS — M797 Fibromyalgia: Secondary | ICD-10-CM

## 2018-10-19 NOTE — Telephone Encounter (Signed)
Pt would like a referrral to [facility] for pain management she states that she is not happy with the current pain management provider she is seeing. Pt can be reached at the home #

## 2018-10-20 NOTE — Telephone Encounter (Signed)
Please advise 

## 2018-10-20 NOTE — Telephone Encounter (Signed)
Ok for referral to [facility] Pain management but she needs to remain w/ her current provider until her appt.  I will not be providing pain meds during the transition

## 2018-10-20 NOTE — Telephone Encounter (Signed)
Pt informed of PCP notes, referral placed.

## 2018-10-26 ENCOUNTER — Encounter: Payer: Self-pay | Admitting: Family Medicine

## 2018-11-09 ENCOUNTER — Encounter: Payer: Self-pay | Admitting: Family Medicine

## 2018-11-09 NOTE — Telephone Encounter (Signed)
This would have to come from PCP or from her rheumatologist.

## 2018-11-09 NOTE — Telephone Encounter (Signed)
Please advise, note already routed to pt to advise PCP is out of office until next week.

## 2018-11-15 ENCOUNTER — Other Ambulatory Visit (HOSPITAL_COMMUNITY): Payer: Self-pay | Admitting: Internal Medicine

## 2018-11-15 DIAGNOSIS — R079 Chest pain, unspecified: Secondary | ICD-10-CM

## 2018-11-17 ENCOUNTER — Ambulatory Visit (INDEPENDENT_AMBULATORY_CARE_PROVIDER_SITE_OTHER): Payer: 59 | Admitting: Family Medicine

## 2018-11-17 ENCOUNTER — Other Ambulatory Visit: Payer: Self-pay

## 2018-11-17 ENCOUNTER — Encounter: Payer: Self-pay | Admitting: Family Medicine

## 2018-11-17 VITALS — Temp 98.0°F | Wt 178.0 lb

## 2018-11-17 DIAGNOSIS — M797 Fibromyalgia: Secondary | ICD-10-CM

## 2018-11-17 DIAGNOSIS — L4050 Arthropathic psoriasis, unspecified: Secondary | ICD-10-CM | POA: Diagnosis not present

## 2018-11-17 NOTE — Progress Notes (Signed)
Virtual Visit via Video   I connected with patient on 11/17/18 at 10:30 AM EDT by a video enabled telemedicine application and verified that I am speaking with the correct person using two identifiers.  Location patient: Home Location provider: Acupuncturist, Office Persons participating in the virtual visit: Patient, Provider, [place] (Jess B)  I discussed the limitations of evaluation and management by telemedicine and the availability of in person appointments. The patient expressed understanding and agreed to proceed.  Subjective:   HPI:   'I just do not feel well in general'- pt switched pain doctors and was instructed to restart gabapentin TID.  The first few days she was on medication she struggled to stay awake at work.  Pt saw new pain doctor last week and has a f/u in a month.  Pt spoke w/ pain clinic nurse today about gabapentin side effects.  Pt reports she continues to have back pain, neck pain, foot pain, costochondritis.  Remains on Remicade and weekly Methotrexate.  She is supposed to be referred to Rhe[facility]ology at [facility].  'it's hard for me to put in a full day'.  Pt is considering working part time.  Pt reports it would be easier to work in the AM and prefers shifts to be shortened to 5 hrs at a time.  ROS:   See pertinent positives and negatives per HPI.  Patient Active Problem List   Diagnosis Date Noted  . Nausea 01/27/2018  . Bariatric surgery status 01/26/2018  . RLS (restless legs syndrome) 01/26/2018  . Morbid obesity ([place]) 01/19/2018  . Vitamin D deficiency 11/10/2017  . Fibromyalgia 11/10/2017  . Anxiety and depression 05/27/2016  . Status post total abdominal hysterectomy 04/08/2016  . Costochondritis 12/04/2015  . Anemia, iron deficiency 10/10/2015  . Tick bites 10/10/2015  . Fatigue 10/10/2015  . Allergic rhinitis 09/07/2015  . Hemorrhoid   . Diverticulosis of colon without hemorrhage   . Hiatal hernia   . Rectal bleeding 05/08/2015  .  Abnormal finding on EKG 09/16/2013  . Psoriatic arthritis ([employer]) 09/16/2013  . HTN (hypertension) 09/16/2013  . Morbid obesity, unspecified obesity type ([place]) 09/16/2013  . GERD 12/11/2009  . DIARRHEA, CHRONIC 12/11/2009    Social History   Tobacco Use  . Smoking status: Light Tobacco Smoker    Packs/day: 0.10    Years: 20.00    Pack years: 2.00    Types: Cigarettes  . Smokeless tobacco: Never Used  . Tobacco comment: stopped smoking 6 months ago   Substance Use Topics  . Alcohol use: Yes    Alcohol/week: 16.0 standard drinks    Types: 6 Glasses of wine, 10 Standard drinks or equivalent per week    Comment: socially, weekends 4-5    Current Outpatient Medications:  .  ALPRAZolam (XANAX) 0.5 MG tablet, Take 1 tablet (0.5 mg total) by mouth 2 (two) times daily as needed for anxiety., Disp: 30 tablet, Rfl: 1 .  diclofenac sodium (VOLTAREN) 1 % GEL, Apply 2-4 g topically 4 (four) times daily as needed (for muscle/joint pain.). , Disp: , Rfl: 0 .  folic acid (FOLVITE) 1 MG tablet, Take 1 tablet by mouth daily., Disp: , Rfl:  .  gabapentin (NEURONTIN) 600 MG tablet, TAKE 1 TABLET(600 MG) BY MOUTH AT BEDTIME (Patient taking differently: Take 600 mg by mouth at bedtime. ), Disp: 30 tablet, Rfl: 3 .  inFLIXimab (REMICADE) 100 MG injection, Inject into the vein every 6 (six) weeks. , Disp: , Rfl:  .  nystatin (  MYCOSTATIN) 100000 UNIT/ML suspension, Take 5 mLs (500,000 Units total) by mouth 3 (three) times daily. (Patient taking differently: Take 5 mLs by mouth 3 (three) times daily as needed (for thrush). ), Disp: 120 mL, Rfl: 0 .  ondansetron (ZOFRAN-ODT) 4 MG disintegrating tablet, Take 1 tablet (4 mg total) by mouth every 8 (eight) hours as needed for nausea or vomiting., Disp: 30 tablet, Rfl: 0 .  OVER THE COUNTER MEDICATION, Take 1 tablet by mouth daily. Bariatric Vitamin, Disp: , Rfl:  .  oxyCODONE (OXY IR/ROXICODONE) 5 MG immediate release tablet, TK 1/2 TO 1 T PO ONCE OR TWICE DAILY PRN  FOR BACK PAIN REFRACTORY TO GABAPENTIN, Disp: , Rfl:  .  RASUVO 10 MG/0.2ML SOAJ, , Disp: , Rfl:  .  XIIDRA 5 % SOLN, Place 1 drop into both eyes daily as needed (dry/irritated eyes.). , Disp: , Rfl: 12 .  busPIRone (BUSPAR) 5 MG tablet, Take 1 tablet (5 mg total) by mouth 2 (two) times daily. (Patient not taking: Reported on 11/17/2018), Disp: 60 tablet, Rfl: 1  Allergies  Allergen Reactions  . Aspirin Other (See Comments)    Convulsions   . Toradol [Ketorolac Tromethamine] Nausea And Vomiting  . Adhesive [Tape] Itching and Rash    Objective:   Temp 98 F (36.7 C) (Tympanic)   Wt 178 lb (80.7 kg)   LMP 02/28/2016   BMI 30.08 kg/m   AAOx3, NAD NCAT, EOMI No obvious CN deficits Coloring WNL Pt is able to speak clearly, coherently without shortness of breath or increased work of breathing.  Thought process is linear.  Mood is appropriate.   Assessment and Plan:   Psoriatic Arthritis- following w/ Rheum and Pain Management.  She now feels that she is unable to work a full day due to pain, inability to concentrate, and fatigue due to medications.  She has messages in for both Pain and Rheum in regards to medication adjustments to improve fatigue and mental clouding.  Fibromyalgia- ongoing issue.  Pt feels that between her Psoriatic Arthritis and Fibro her pain and medications have caused fatigue and pain to the point that it is difficult to concentrate and work a full day.  We discussed her options- FMLA, [employer], LTD and that while I can fill out FMLA and job accommodations, I do not do disability evaluations.  Encouraged her to reach out to Rheum and Pain management if she wants to pursue that option.  Total time spent w/ pt 27 minutes, >50% spent counseling.   [name], MD 11/17/2018

## 2018-11-17 NOTE — Progress Notes (Signed)
I have discussed the procedure for the virtual visit with the patient who has given consent to proceed with assessment and treatment.   Roosevelt Eimers L Marlee Armenteros, CMA     

## 2018-11-19 ENCOUNTER — Other Ambulatory Visit (HOSPITAL_COMMUNITY): Payer: 59

## 2018-11-19 ENCOUNTER — Telehealth: Payer: Self-pay | Admitting: Family Medicine

## 2018-11-19 NOTE — Telephone Encounter (Signed)
Paperwork given to PCP for completion.  

## 2018-11-19 NOTE — Telephone Encounter (Signed)
I have placed FMLA forms in the bin upfront with a charge sheet. This was discussed at her last appt with Tabori on 11/17/2018.

## 2018-11-23 ENCOUNTER — Encounter: Payer: Self-pay | Admitting: Family Medicine

## 2018-11-23 ENCOUNTER — Telehealth: Payer: Self-pay | Admitting: *Deleted

## 2018-11-23 NOTE — Telephone Encounter (Signed)
Erroneous encounter

## 2018-11-26 ENCOUNTER — Other Ambulatory Visit (HOSPITAL_COMMUNITY): Payer: 59

## 2018-11-27 ENCOUNTER — Encounter: Payer: Self-pay | Admitting: Family Medicine

## 2018-11-29 ENCOUNTER — Encounter: Payer: Self-pay | Admitting: Family Medicine

## 2018-11-30 ENCOUNTER — Encounter (HOSPITAL_COMMUNITY): Payer: Self-pay | Admitting: *Deleted

## 2018-11-30 ENCOUNTER — Emergency Department (HOSPITAL_COMMUNITY)
Admission: EM | Admit: 2018-11-30 | Discharge: 2018-11-30 | Disposition: A | Discharge status: Home / Self Care | Payer: 59 | Attending: Emergency Medicine | Admitting: Emergency Medicine

## 2018-11-30 ENCOUNTER — Emergency Department (HOSPITAL_COMMUNITY): Payer: 59

## 2018-11-30 ENCOUNTER — Other Ambulatory Visit: Payer: Self-pay

## 2018-11-30 DIAGNOSIS — R0789 Other chest pain: Secondary | ICD-10-CM

## 2018-11-30 DIAGNOSIS — Z79899 Other long term (current) drug therapy: Secondary | ICD-10-CM | POA: Diagnosis not present

## 2018-11-30 DIAGNOSIS — Z0279 Encounter for issue of other medical certificate: Secondary | ICD-10-CM

## 2018-11-30 DIAGNOSIS — F17210 Nicotine dependence, cigarettes, uncomplicated: Secondary | ICD-10-CM | POA: Insufficient documentation

## 2018-11-30 DIAGNOSIS — R071 Chest pain on breathing: Secondary | ICD-10-CM | POA: Insufficient documentation

## 2018-11-30 DIAGNOSIS — R079 Chest pain, unspecified: Secondary | ICD-10-CM | POA: Diagnosis present

## 2018-11-30 DIAGNOSIS — I10 Essential (primary) hypertension: Secondary | ICD-10-CM | POA: Insufficient documentation

## 2018-11-30 LAB — BASIC METABOLIC PANEL
Anion gap: 6 (ref 5–15)
BUN: 15 mg/dL (ref 6–20)
CO2: 23 mmol/L (ref 22–32)
Calcium: 9.1 mg/dL (ref 8.9–10.3)
Chloride: 109 mmol/L (ref 98–111)
Creatinine, Ser: 0.54 mg/dL (ref 0.44–1.00)
GFR calc Af Amer: >60 mL/min (ref >60)
GFR calc non Af Amer: >60 mL/min (ref >60)
Glucose, Bld: 100 mg/dL — ABNORMAL HIGH (ref 70–99)
Potassium: 3.4 mmol/L — ABNORMAL LOW (ref 3.5–5.1)
Sodium: 138 mmol/L (ref 135–145)

## 2018-11-30 LAB — CBC WITH DIFFERENTIAL/PLATELET
Abs Immature Granulocytes: 0.02 10*3/uL (ref 0.00–0.07)
Basophils Absolute: 0 10*3/uL (ref 0.0–0.1)
Basophils Relative: 1 %
Eosinophils Absolute: 0.1 10*3/uL (ref 0.0–0.5)
Eosinophils Relative: 1 %
HCT: 41.3 % (ref 36.0–46.0)
Hemoglobin: 14.2 g/dL (ref 12.0–15.0)
Immature Granulocytes: 0 %
Lymphocytes Relative: 30 %
Lymphs Abs: 2.4 10*3/uL (ref 0.7–4.0)
MCH: 34.7 pg — ABNORMAL HIGH (ref 26.0–34.0)
MCHC: 34.4 g/dL (ref 30.0–36.0)
MCV: 101 fL — ABNORMAL HIGH (ref 80.0–100.0)
Monocytes Absolute: 0.6 10*3/uL (ref 0.1–1.0)
Monocytes Relative: 8 %
Neutro Abs: 4.7 10*3/uL (ref 1.7–7.7)
Neutrophils Relative %: 60 %
Platelets: 208 10*3/uL (ref 150–400)
RBC: 4.09 MIL/uL (ref 3.87–5.11)
RDW: 12.6 % (ref 11.5–15.5)
WBC: 7.8 10*3/uL (ref 4.0–10.5)
nRBC: 0 % (ref 0.0–0.2)

## 2018-11-30 LAB — TROPONIN I (HIGH SENSITIVITY): Troponin I (High Sensitivity): <2 ng/L (ref <18)

## 2018-11-30 MED ORDER — ONDANSETRON HCL 4 MG/2ML IJ SOLN
4.0000 mg | Freq: Once | INTRAMUSCULAR | Status: AC
  Administered 2018-11-30: 4 mg via INTRAVENOUS
  Filled 2018-11-30: qty 2

## 2018-11-30 MED ORDER — HYDROMORPHONE HCL 1 MG/ML IJ SOLN
1.0000 mg | Freq: Once | INTRAMUSCULAR | Status: AC
  Administered 2018-11-30: 1 mg via INTRAVENOUS
  Filled 2018-11-30: qty 1

## 2018-11-30 MED ORDER — MORPHINE SULFATE (PF) 4 MG/ML IV SOLN
4.0000 mg | Freq: Once | INTRAVENOUS | Status: AC
  Administered 2018-11-30: 4 mg via INTRAVENOUS
  Filled 2018-11-30: qty 1

## 2018-11-30 MED ORDER — METHYLPREDNISOLONE SODIUM SUCC 125 MG IJ SOLR
125.0000 mg | Freq: Once | INTRAMUSCULAR | Status: AC
  Administered 2018-11-30: 125 mg via INTRAVENOUS
  Filled 2018-11-30: qty 2

## 2018-11-30 MED ORDER — PREDNISONE 10 MG PO TABS: ORAL_TABLET | ORAL | 0 refills | Status: DC

## 2018-11-30 NOTE — Telephone Encounter (Signed)
Form completed and placed in basket  

## 2018-11-30 NOTE — ED Notes (Signed)
ekg handed to Dr. Alvino Chapel

## 2018-11-30 NOTE — Telephone Encounter (Signed)
FYI

## 2018-11-30 NOTE — Discharge Instructions (Addendum)
Take your next dose of prednisone tomorrow evening and complete the taper as instructed.  You may try a heating pad application to your chest which may help with discomfort.  Your labs and imaging are reassuring tonight.

## 2018-11-30 NOTE — Telephone Encounter (Signed)
Picked up forms from the back and faxed and sent to scan///[email or web address]

## 2018-11-30 NOTE — ED Provider Notes (Signed)
[facility] EMERGENCY DEPARTMENT Provider Note   CSN: [id number] Arrival date & time: 11/30/18  1154    History   Chief Complaint Chief Complaint  Patient presents with   Chest Pain    HPI Dawn Hayes is a 48 y.o. female with a history as outlined below, most significant for psoriatic arthritis, fibromyalgia, GERD, hypertension, history of hiatal hernia and history of costochondritis presenting with a suspected flare of her costochondritis which started yesterday evening.  She describes mid sternal to right sided chest wall pain which is worsened with palpation and deep inspiration along with shortness of breath which she states is similar to prior episodes of costochondritis.  She reports being on steroids for a prolonged period of time with her last flare under the care of Dr. Amil Amen of rheumatology in [place].  She denies fevers or chills, no cough or congestion.  She also denies nausea, vomiting or abdominal pain, no radiation of pain into the back.  She denies swelling of her extremities.  She does endorse chronic pain in her extremities associated with her fibromyalgia, but not new or different from her baseline.  She is under pain management, takes oxycodone 5 mg twice daily which is not relieving her current chest wall pain.  Her pain is worsened when she lies supine, slightly better leaning forward.  She has been scheduled by Dr. Amil Amen for an echocardiogram in 3 days to further assess her symptoms.     HPI  Past Medical History:  Diagnosis Date   Anemia    Arthritis    psoriatic arthritis   Costochondritis    Diverticulitis    Dysmenorrhea    Fibromyalgia    GERD (gastroesophageal reflux disease)    Headache    Migraines   History of hiatal hernia    Hypertension    Menorrhagia    Psoriatic arthritis ([place])    Smoker     Patient Active Problem List   Diagnosis Date Noted   Nausea 01/27/2018   Bariatric surgery status 01/26/2018   RLS  (restless legs syndrome) 01/26/2018   Vitamin D deficiency 11/10/2017   Fibromyalgia 11/10/2017   Anxiety and depression 05/27/2016   Status post total abdominal hysterectomy 04/08/2016   Costochondritis 12/04/2015   Anemia, iron deficiency 10/10/2015   Tick bites 10/10/2015   Fatigue 10/10/2015   Allergic rhinitis 09/07/2015   Hemorrhoid    Diverticulosis of colon without hemorrhage    Hiatal hernia    Rectal bleeding 05/08/2015   Abnormal finding on EKG 09/16/2013   Psoriatic arthritis ([place]) 09/16/2013   HTN (hypertension) 09/16/2013   GERD 12/11/2009   DIARRHEA, CHRONIC 12/11/2009    Past Surgical History:  Procedure Laterality Date   ABDOMINAL HYSTERECTOMY     CARPAL TUNNEL RELEASE Left 01/19/2015   Procedure: LEFT CARPAL TUNNEL RELEASE;  Surgeon: Roseanne Kaufman, MD;  Location: [place];  Service: Orthopedics;  Laterality: Left;   CHOLECYSTECTOMY     CHOLECYSTECTOMY, LAPAROSCOPIC  2006   COLONOSCOPY  1998   normal, [place]   COLONOSCOPY N/A 05/30/2015   RMR: Minimal anal canal hemorrhoids. colonic diverticulosis. I suspect relatively trivial recent gI Bleed likely related to hemorrhoids. This finding alone would not likely adequatly explain her degree of anemia. she likely has a significent component from menstrual losses. , etc.    COLONOSCOPY WITH PROPOFOL N/A 03/09/2017   Dr. Gala Romney: scattered diverticula, distal 5cm of TI normal. Grade II non-bleeding internal hemorrhoids. next TCS 10 years.  CYSTOSCOPY N/A 04/08/2016   Procedure: CYSTOSCOPY;  Surgeon: Nunzio Cobbs, MD;  Location: [facility];  Service: Gynecology;  Laterality: N/A;   DILATATION & CURETTAGE/HYSTEROSCOPY WITH MYOSURE N/A 01/04/2016   Procedure: DILATATION & CURETTAGE/HYSTEROSCOPY;  Surgeon: Nunzio Cobbs, MD;  Location: [facility];  Service: Gynecology;  Laterality: N/A;   ESOPHAGOGASTRODUODENOSCOPY  09/11/99   tiny esophageal erosions with mild  erosive reflux/no barrett's/normal stomach   ESOPHAGOGASTRODUODENOSCOPY N/A 05/30/2015   RMR: Hiatal hernia otherwise negative EGD   GASTRIC ROUX-EN-Y N/A 01/19/2018   Procedure: LAPAROSCOPIC ROUX-EN-Y GASTRIC BYPASS WITH HIATAL HERNIA REPAIR, WITH UPPER ENDOSCOPY, ERAS Pathway;  Surgeon: Greer Pickerel, MD;  Location: [facility];  Service: General;  Laterality: N/A;   GIVENS CAPSULE STUDY N/A 08/14/2017   Procedure: GIVENS CAPSULE STUDY;  Surgeon: Daneil Dolin, MD;  [facility];  Service: Endoscopy;  Laterality: N/A;  7:30am   HYSTERECTOMY ABDOMINAL WITH SALPINGECTOMY Bilateral 04/08/2016   Procedure: HYSTERECTOMY ABDOMINAL WITH SALPINGECTOMY;  Surgeon: Nunzio Cobbs, MD;  Location: [facility];  Service: Gynecology;  Laterality: Bilateral;   LAPAROSCOPIC BILATERAL SALPINGECTOMY N/A 04/08/2016   Procedure: LAPAROSCOPIC BILATERAL SALPINGECTOMY possible BSO;  Surgeon: Nunzio Cobbs, MD;  Location: [facility];  Service: Gynecology;  Laterality: N/A;   LAPAROSCOPIC HYSTERECTOMY N/A 04/08/2016   Procedure: ATTEMPTED HYSTERECTOMY TOTAL LAPAROSCOPIC;  Surgeon: Nunzio Cobbs, MD;  Location: [facility];  Service: Gynecology;  Laterality: N/A;   LAPAROSCOPIC LYSIS OF ADHESIONS  04/08/2016   Procedure: LAPAROSCOPIC LYSIS OF ADHESIONS;  Surgeon: Nunzio Cobbs, MD;  Location: [facility];  Service: Gynecology;;   LAPAROSCOPIC LYSIS OF ADHESIONS N/A 01/19/2018   Procedure: LAPAROSCOPIC LYSIS OF ADHESIONS;  Surgeon: Greer Pickerel, MD;  Location: [facility];  Service: General;  Laterality: N/A;     OB History    Gravida  0   Para      Term      Preterm      AB      Living        SAB      TAB      Ectopic      Multiple      Live Births               Home Medications    Prior to Admission medications   Medication Sig Start Date End Date Taking? Authorizing Provider  ALPRAZolam Duanne Moron) 0.5 MG tablet Take 1 tablet (0.5 mg total) by mouth 2 (two)  times daily as needed for anxiety. 08/11/18  Yes Midge Minium, MD  diclofenac sodium (VOLTAREN) 1 % GEL Apply 2-4 g topically 4 (four) times daily as needed (for muscle/joint pain.).  09/25/17  Yes [provider]  folic acid (FOLVITE) 1 MG tablet Take 1 tablet by mouth daily. 07/07/18  Yes [provider]  gabapentin (NEURONTIN) 600 MG tablet TAKE 1 TABLET(600 MG) BY MOUTH AT BEDTIME Patient taking differently: Take 600 mg by mouth at bedtime.  08/31/18  Yes Midge Minium, MD  inFLIXimab (REMICADE) 100 MG injection Inject into the vein every 6 (six) weeks.    Yes [provider]  ondansetron (ZOFRAN-ODT) 4 MG disintegrating tablet Take 1 tablet (4 mg total) by mouth every 8 (eight) hours as needed for nausea or vomiting. 10/11/18  Yes Midge Minium, MD  OVER THE COUNTER MEDICATION Take 1 tablet by mouth daily. Bariatric Vitamin   Yes [provider]  oxyCODONE (OXY IR/ROXICODONE) 5 MG immediate release tablet Take 5 mg by mouth 2 (two) times daily as needed for moderate pain or severe pain.  11/14/18  Yes [provider]  RASUVO 10 MG/0.2ML SOAJ Inject into the skin every 7 (seven) days.  09/14/18  Yes [provider]  busPIRone (BUSPAR) 5 MG tablet Take 1 tablet (5 mg total) by mouth 2 (two) times daily. Patient not taking: Reported on 11/17/2018 08/31/18   Midge Minium, MD  predniSONE (DELTASONE) 10 MG tablet Take 6 tablets day one, 5 tablets day two, 4 tablets day three, 3 tablets day four, 2 tablets day five, then 1 tablet day six 11/30/18   Evalee Jefferson, PA-C    Family History Family History  Problem Relation Age of Onset   Brain cancer Father    Prostate cancer Father    Hyperlipidemia Mother    Colon cancer Maternal Grandmother 25   Stroke Paternal Grandfather        COD   Aneurysm Maternal Grandfather    Cancer Other    Hypertension Other    Other Paternal Uncle        gastric polyps    Social  History Social History   Tobacco Use   Smoking status: Light Tobacco Smoker    Packs/day: 0.10    Years: 20.00    Pack years: 2.00    Types: Cigarettes   Smokeless tobacco: Never Used   Tobacco comment: stopped smoking 6 months ago   Substance Use Topics   Alcohol use: Yes    Alcohol/week: 16.0 standard drinks    Types: 6 Glasses of wine, 10 Standard drinks or equivalent per week    Comment: socially, weekends 4-5   Drug use: No     Allergies   Aspirin, Toradol [ketorolac tromethamine], and Adhesive [tape]   Review of Systems Review of Systems  Constitutional: Negative for fever.  HENT: Negative for congestion and sore throat.   Eyes: Negative.   Respiratory: Positive for shortness of breath. Negative for chest tightness, wheezing and stridor.   Cardiovascular: Positive for chest pain. Negative for palpitations and leg swelling.  Gastrointestinal: Negative for abdominal pain, nausea and vomiting.  Genitourinary: Negative.   Musculoskeletal: Positive for arthralgias. Negative for joint swelling and neck pain.       Arthralgias are chronic  Skin: Negative.  Negative for rash and wound.  Neurological: Negative for dizziness, weakness, light-headedness, numbness and headaches.  Psychiatric/Behavioral: Negative.      Physical Exam Updated Vital Signs BP 113/82    Pulse 70    Temp 98.2 F (36.8 C) (Oral)    Resp 12    Ht 5\' 1"  (1.549 m)    Wt 80.7 kg    LMP 02/28/2016    SpO2 98%    BMI 33.63 kg/m   Physical Exam Vitals signs and nursing note reviewed.  Constitutional:      Appearance: She is well-developed.  HENT:     Head: Normocephalic and atraumatic.  Eyes:     Conjunctiva/sclera: Conjunctivae normal.  Neck:     Musculoskeletal: Normal range of motion.  Cardiovascular:     Rate and Rhythm: Normal rate and regular rhythm.     Heart sounds: Normal heart sounds. Heart sounds not distant. No murmur. No friction rub.  Pulmonary:     Effort: Pulmonary effort  is normal. No tachypnea, accessory muscle usage or respiratory distress.     Breath sounds: Normal breath sounds. No stridor. No wheezing.  Chest:     Chest wall: Tenderness present.     Comments: ttp midsternal and right lateral sternal border.  No edema, no crepitus. Abdominal:     General: Bowel sounds are normal.     Palpations: Abdomen is soft.     Tenderness: There is no abdominal tenderness.  Musculoskeletal: Normal range of motion.  Skin:    General: Skin is warm and dry.  Neurological:     Mental Status: She is alert.      ED Treatments / Results  Labs (all labs ordered are listed, but only abnormal results are displayed) Labs Reviewed  CBC WITH DIFFERENTIAL/PLATELET - Abnormal; Notable for the following components:      Result Value   MCV 101.0 (*)    MCH 34.7 (*)    All other components within normal limits  BASIC METABOLIC PANEL - Abnormal; Notable for the following components:   Potassium 3.4 (*)    Glucose, Bld 100 (*)    All other components within normal limits  TROPONIN I (HIGH SENSITIVITY)    EKG EKG Interpretation  Date/Time:  Tuesday November 30 2018 12:10:50 EDT Ventricular Rate:  101 PR Interval:  154 QRS Duration: 80 QT Interval:  354 QTC Calculation: 459 R Axis:   -59 Text Interpretation:  Sinus tachycardia Low voltage QRS Left anterior fascicular block Cannot rule out Inferior infarct (masked by fascicular block?) , age undetermined Cannot rule out Anterior infarct , age undetermined Abnormal ECG Confirmed by Nat Christen [phone]) on 11/30/2018 5:04:22 PM   Radiology Dg Chest 2 View  Result Date: 11/30/2018 CLINICAL DATA:  Chest pain, shortness of breath EXAM: CHEST - 2 VIEW COMPARISON:  10/06/2018 FINDINGS: The heart size and mediastinal contours are within normal limits. Both lungs are clear. The visualized skeletal structures are unremarkable. IMPRESSION: No active cardiopulmonary disease. Electronically Signed   By: Davina Poke M.D.   On:  11/30/2018 12:30    Procedures Procedures (including critical care time)  Medications Ordered in ED Medications  methylPREDNISolone sodium succinate (SOLU-MEDROL) 125 mg/2 mL injection 125 mg (125 mg Intravenous Given 11/30/18 1815)  morphine 4 MG/ML injection 4 mg (4 mg Intravenous Given 11/30/18 1814)  ondansetron (ZOFRAN) injection 4 mg (4 mg Intravenous Given 11/30/18 1814)  HYDROmorphone (DILAUDID) injection 1 mg (1 mg Intravenous Given 11/30/18 1959)     Initial Impression / Assessment and Plan / ED Course  I have reviewed the triage vital signs and the nursing notes.  Pertinent labs & imaging results that were available during my care of the patient were reviewed by me and considered in my medical decision making (see chart for details).        Patient with reproducible chest wall pain which was improved at time of discharge.  Her EKG and labs were reviewed as well as her chest x-ray, all stable.  Doubt PE, patient has a long history of costochondritis, she reports her symptoms are acute on chronic and unchanged from prior episodes.  No evidence for ACS with a normal EKG and troponin.  She was given a short course of prednisone taper over 6 days, caution regarding epigastric discomfort or heartburn with this treatment.  Follow-up with her PCP as needed for ongoing symptom relief.  She is in chronic pain management, encouraged to continue the regimen as outlined with this provider.  Final Clinical Impressions(s) / ED Diagnoses   Final diagnoses:  Costochondral chest pain    ED Discharge Orders  Ordered    predniSONE (DELTASONE) 10 MG tablet     11/30/18 2037           Landis Martins 11/30/18 2043    Nat Christen, MD 12/01/18 [phone]

## 2018-11-30 NOTE — ED Triage Notes (Signed)
Pt with chest pain since last night. + sob, denies fevers.

## 2018-11-30 NOTE — ED Triage Notes (Signed)
Pt with hx of costochondritis and believes she is having a flare up.

## 2018-12-02 ENCOUNTER — Encounter: Payer: Self-pay | Admitting: Family Medicine

## 2018-12-03 ENCOUNTER — Other Ambulatory Visit: Payer: Self-pay

## 2018-12-03 ENCOUNTER — Ambulatory Visit (HOSPITAL_COMMUNITY): Payer: 59 | Attending: Cardiology

## 2018-12-03 DIAGNOSIS — R079 Chest pain, unspecified: Secondary | ICD-10-CM | POA: Diagnosis not present

## 2018-12-07 ENCOUNTER — Encounter: Payer: Self-pay | Admitting: Family Medicine

## 2018-12-09 ENCOUNTER — Encounter: Payer: Self-pay | Admitting: Family Medicine

## 2018-12-14 ENCOUNTER — Encounter: Payer: Self-pay | Admitting: Family Medicine

## 2018-12-31 ENCOUNTER — Encounter: Payer: Self-pay | Admitting: Family Medicine

## 2018-12-31 ENCOUNTER — Other Ambulatory Visit: Payer: Self-pay

## 2018-12-31 ENCOUNTER — Encounter: Payer: Self-pay | Admitting: Physician Assistant

## 2018-12-31 ENCOUNTER — Ambulatory Visit (INDEPENDENT_AMBULATORY_CARE_PROVIDER_SITE_OTHER): Payer: 59 | Admitting: Physician Assistant

## 2018-12-31 VITALS — BP 113/86

## 2018-12-31 DIAGNOSIS — M940 Chondrocostal junction syndrome [Tietze]: Secondary | ICD-10-CM

## 2018-12-31 MED ORDER — PREDNISONE 10 MG PO TABS: ORAL_TABLET | ORAL | 0 refills | Status: DC

## 2018-12-31 NOTE — Patient Instructions (Signed)
Instructions sent to MyChart.   Please continue chronic pain medications as directed by specialist. Take the steroid taper as directed. Avoid heavy lifting or overexertion.  If symptoms are not improving as they typically do with treatment for costochondritis, anything worsens or you note new symptoms, I want you to be assessed in the ER.

## 2018-12-31 NOTE — Progress Notes (Signed)
Virtual Visit via Telephone Note  I connected with Dawn Hayes on 12/31/18 at  3:00 PM EDT by telephone and verified that I am speaking with the correct person using two identifiers.  Location: Patient: Home Provider: [facility]   I discussed the limitations, risks, security and privacy concerns of performing an evaluation and management service by telephone and the availability of in person appointments. I also discussed with the patient that there may be a patient responsible charge related to this service. The patient expressed understanding and agreed to proceed.  History of Present Illness: Patient presents via doxy.me today c/o 3 days of chest wall pain, consistent with her typical flares of costochondritis. Notes sternal and side pain bilaterally worsening over the past 24 hours. Symptoms at rest. No affected by exertion. Denies SOB, racing heart, lightheadedness or dizziness. Called her Rheumatologist and pain specialist without response. Is taking her chronic pain medications as directed. Denies any new or differing symptoms.    Observations/Objective: Patient is in no acute distress.  Resting comfortably at home.  Head is normocephalic, atraumatic.  No labored breathing.  Speech is clear and coherent with logical content.  Patient is alert and oriented at baseline.    Assessment and Plan: 1. Costochondritis Flare. These are recurrent for her. Has had extensive workup with Cardiology and Rheumatology regarding this. Had repeat ER assessment at time of last flare in July with negative workup. Has had Echo with Rheumatology since that assessment which she reports as normal. Per EMR review, showing normal systolic and diastolic function. No concerning findings. No new or atypical symptoms for her. Will have her continue chronic pain medication regimen. Start steroid taper. Strict ER precautions reviewed with patient. Patient voiced understanding and agreement with plan.  - predniSONE  (DELTASONE) 10 MG tablet; Take 6 tablets day one, 5 tablets day two, 4 tablets day three, 3 tablets day four, 2 tablets day five, then 1 tablet day six  Dispense: 21 tablet; Refill: 0   Follow Up Instructions:  I discussed the assessment and treatment plan with the patient. The patient was provided an opportunity to ask questions and all were answered. The patient agreed with the plan and demonstrated an understanding of the instructions.   The patient was advised to call back or seek an in-person evaluation if the symptoms worsen or if the condition fails to improve as anticipated.  I provided 15 minutes of non-face-to-face time during this encounter.   Leeanne Rio, PA-C

## 2018-12-31 NOTE — Progress Notes (Signed)
I have discussed the procedure for the virtual visit with the patient who has given consent to proceed with assessment and treatment.   Holly Iannaccone S Nevaan Bunton, CMA     

## 2019-01-24 ENCOUNTER — Encounter: Payer: Self-pay | Admitting: Family Medicine

## 2019-01-24 ENCOUNTER — Encounter: Payer: Self-pay | Admitting: General Practice

## 2019-02-09 ENCOUNTER — Encounter: Payer: Self-pay | Admitting: Family Medicine

## 2019-02-21 ENCOUNTER — Ambulatory Visit: Payer: 59 | Admitting: Family Medicine

## 2019-02-21 ENCOUNTER — Encounter: Payer: Self-pay | Admitting: Family Medicine

## 2019-02-21 ENCOUNTER — Other Ambulatory Visit: Payer: Self-pay

## 2019-02-21 VITALS — BP 118/84 | HR 83 | Temp 98.1°F | Resp 16 | Ht 62.0 in | Wt 170.5 lb

## 2019-02-21 DIAGNOSIS — L659 Nonscarring hair loss, unspecified: Secondary | ICD-10-CM

## 2019-02-21 DIAGNOSIS — M25512 Pain in left shoulder: Secondary | ICD-10-CM

## 2019-02-21 DIAGNOSIS — M940 Chondrocostal junction syndrome [Tietze]: Secondary | ICD-10-CM

## 2019-02-21 DIAGNOSIS — R809 Proteinuria, unspecified: Secondary | ICD-10-CM | POA: Diagnosis not present

## 2019-02-21 DIAGNOSIS — I77810 Thoracic aortic ectasia: Secondary | ICD-10-CM

## 2019-02-21 DIAGNOSIS — R1011 Right upper quadrant pain: Secondary | ICD-10-CM | POA: Diagnosis not present

## 2019-02-21 LAB — POCT URINALYSIS DIPSTICK
Bilirubin, UA: NEGATIVE
Blood, UA: NEGATIVE
Glucose, UA: NEGATIVE
Ketones, UA: NEGATIVE
Leukocytes, UA: NEGATIVE
Nitrite, UA: NEGATIVE
Protein, UA: NEGATIVE
Spec Grav, UA: 1.025 (ref 1.010–1.025)
Urobilinogen, UA: 0.2 E.U./dL
pH, UA: 5 (ref 5.0–8.0)

## 2019-02-21 NOTE — Patient Instructions (Signed)
Follow up as needed or as scheduled We'll notify you of your lab results and make any changes if needed We'll call you with your Ortho appt for the L shoulder pain Talk w/ Rheumatology about the hair loss b/c this may be related to your Remicade infusions Please call Dr Manuella Ghazi and let them know you are having a flare b/c they wanted to see you during one of these episodes Call with any questions or concerns Hang in there!!

## 2019-02-21 NOTE — Progress Notes (Signed)
   Subjective:    Patient ID: Dawn Hayes, female    DOB: 1970-07-15, 48 y.o.   MRN: [id number]  HPI Protein in urine- noted by Rheum 2 weeks ago.  Dr Manuella Ghazi asked her to f/u w/ PCP to determine if this persists.  Dilatation of aortic root- noted on ECHO done 7/24 to be 41 mm.  Had f/u CT on 9/30 that showed normal aorta.  L shoulder pain- started last week, 6/10.  Pain worsens w/ movement.  No relief w/ heat/ice/pain patch.  No numbness/tingling.  No known injury, no change in activity level.  Pain above head.  Costochondritis- started yesterday, R sided under breast.  Worse w/ deep breath/hiccup, not reproducible on palpation.  Radiates to neck and shoulder.  Pain patch decreases pain from 9-->7/10.  No numbness/tingling of either arm.  'i'm just having a flare'.  No change w/ eating.  Hair loss- sxs started ~3 months ago.  Has thinning areas visible.  No changes to skin or nails.  Feels it may be medication related.  Pt started Remicade  Review of Systems For ROS see HPI     Objective:   Physical Exam Vitals signs reviewed.  Constitutional:      General: She is not in acute distress.    Appearance: She is well-developed. She is obese.  HENT:     Head: Normocephalic and atraumatic. Head contusion: areas of scalp visible due to thinning hair.  Eyes:     Conjunctiva/sclera: Conjunctivae normal.     Pupils: Pupils are equal, round, and reactive to light.  Neck:     Musculoskeletal: Normal range of motion and neck supple.     Thyroid: No thyromegaly.  Cardiovascular:     Rate and Rhythm: Normal rate and regular rhythm.     Heart sounds: Normal heart sounds. No murmur.  Pulmonary:     Effort: Pulmonary effort is normal. No respiratory distress.     Breath sounds: Normal breath sounds.  Abdominal:     General: There is no distension.     Palpations: Abdomen is soft.     Tenderness: There is abdominal tenderness (TTP over RUQ).  Musculoskeletal:     Comments: + Hawkings, Neers,  Empty can test on L Pain w/ forward flexion, abduction, external rotation of L shoulder Bony prominence of L acromion  Lymphadenopathy:     Cervical: No cervical adenopathy.  Skin:    General: Skin is warm and dry.  Neurological:     Mental Status: She is alert and oriented to person, place, and time.  Psychiatric:        Behavior: Behavior normal.           Assessment & Plan:  Proteinuria- noted at last rheum visit.  Negative today.  Hair loss- new.  Suspect this is due to her Remicade infusions as this is a widely reported pt side effect.  Encouraged her to discuss w/ Rheum  L shoulder pain- new.  Appears to be orthopedic in nature rather than rheumatologic given her findings on PE.  Refer to ortho.  Dilatation of aorta- seen on ECHO this summer but normal measures on CT last month.  No further w/u at this time.  RUQ pain- pt reports this is a 'costochondritis flare' but I am not as sure.  She seems to be having referred pain to her R shoulder.  She has already had her gallbladder removed but a CBD stone is possible.  Check labs.

## 2019-02-22 LAB — HEPATIC FUNCTION PANEL
ALT: 49 U/L — ABNORMAL HIGH (ref 0–35)
AST: 44 U/L — ABNORMAL HIGH (ref 0–37)
Albumin: 3.7 g/dL (ref 3.5–5.2)
Alkaline Phosphatase: 138 U/L — ABNORMAL HIGH (ref 39–117)
Bilirubin, Direct: 0.1 mg/dL (ref 0.0–0.3)
Total Bilirubin: 0.6 mg/dL (ref 0.2–1.2)
Total Protein: 6.5 g/dL (ref 6.0–8.3)

## 2019-02-22 LAB — CBC WITH DIFFERENTIAL/PLATELET
Basophils Absolute: 0 10*3/uL (ref 0.0–0.1)
Basophils Relative: 0.5 % (ref 0.0–3.0)
Eosinophils Absolute: 0.1 10*3/uL (ref 0.0–0.7)
Eosinophils Relative: 1.6 % (ref 0.0–5.0)
HCT: 41.5 % (ref 36.0–46.0)
Hemoglobin: 13.9 g/dL (ref 12.0–15.0)
Lymphocytes Relative: 25 % (ref 12.0–46.0)
Lymphs Abs: 2.3 10*3/uL (ref 0.7–4.0)
MCHC: 33.5 g/dL (ref 30.0–36.0)
MCV: 99.6 fl (ref 78.0–100.0)
Monocytes Absolute: 0.7 10*3/uL (ref 0.1–1.0)
Monocytes Relative: 8 % (ref 3.0–12.0)
Neutro Abs: 5.9 10*3/uL (ref 1.4–7.7)
Neutrophils Relative %: 64.9 % (ref 43.0–77.0)
Platelets: 298 10*3/uL (ref 150.0–400.0)
RBC: 4.17 Mil/uL (ref 3.87–5.11)
RDW: 12.7 % (ref 11.5–15.5)
WBC: 9.1 10*3/uL (ref 4.0–10.5)

## 2019-02-22 LAB — IRON,TIBC AND FERRITIN PANEL
%SAT: 40 % (calc) (ref 16–45)
Ferritin: 36 ng/mL (ref 16–232)
Iron: 114 ug/dL (ref 40–190)
TIBC: 285 mcg/dL (calc) (ref 250–450)

## 2019-02-22 LAB — BASIC METABOLIC PANEL
BUN: 11 mg/dL (ref 6–23)
CO2: 26 mEq/L (ref 19–32)
Calcium: 9.6 mg/dL (ref 8.4–10.5)
Chloride: 102 mEq/L (ref 96–112)
Creatinine, Ser: 0.49 mg/dL (ref 0.40–1.20)
GFR: 134.59 mL/min (ref >60.00)
Glucose, Bld: 83 mg/dL (ref 70–99)
Potassium: 3.9 mEq/L (ref 3.5–5.1)
Sodium: 139 mEq/L (ref 135–145)

## 2019-02-22 LAB — LIPASE: Lipase: 6 U/L — ABNORMAL LOW (ref 11.0–59.0)

## 2019-02-22 LAB — AMYLASE: Amylase: 20 U/L — ABNORMAL LOW (ref 27–131)

## 2019-02-22 LAB — TSH: TSH: 1.51 u[IU]/mL (ref 0.35–4.50)

## 2019-02-22 NOTE — Assessment & Plan Note (Signed)
Pt reports the pain under her R breast is a 'flare' of her costochondritis.  She is TTP over RUQ and over R lower ribs.  Given that this is not her typical location, will get labs to assess RUQ.  She is s/p cholecystectomy but a CBD stone is possible.  I also told her that her Rheumatologist wanted to see her during 1 of these flares (per their note).  She is to make contact regarding this.  Since she sees pain management and has multiple pain meds, will not add to her regimen at this time.

## 2019-02-23 ENCOUNTER — Other Ambulatory Visit: Payer: Self-pay

## 2019-02-23 ENCOUNTER — Encounter: Payer: Self-pay | Admitting: Orthopaedic Surgery

## 2019-02-23 ENCOUNTER — Ambulatory Visit: Payer: Self-pay

## 2019-02-23 ENCOUNTER — Ambulatory Visit (INDEPENDENT_AMBULATORY_CARE_PROVIDER_SITE_OTHER): Payer: 59 | Admitting: Orthopaedic Surgery

## 2019-02-23 ENCOUNTER — Other Ambulatory Visit (INDEPENDENT_AMBULATORY_CARE_PROVIDER_SITE_OTHER): Payer: 59

## 2019-02-23 DIAGNOSIS — R748 Abnormal levels of other serum enzymes: Secondary | ICD-10-CM | POA: Diagnosis not present

## 2019-02-23 DIAGNOSIS — M25512 Pain in left shoulder: Secondary | ICD-10-CM | POA: Diagnosis not present

## 2019-02-23 DIAGNOSIS — G8929 Other chronic pain: Secondary | ICD-10-CM | POA: Diagnosis not present

## 2019-02-23 LAB — GAMMA GT: GGT: 108 U/L — ABNORMAL HIGH (ref 7–51)

## 2019-02-23 MED ORDER — LIDOCAINE HCL 1 % IJ SOLN
3.0000 mL | INTRAMUSCULAR | Status: AC | PRN
  Administered 2019-02-23: 3 mL

## 2019-02-23 MED ORDER — BUPIVACAINE HCL 0.5 % IJ SOLN
3.0000 mL | INTRAMUSCULAR | Status: AC | PRN
  Administered 2019-02-23: 3 mL via INTRA_ARTICULAR

## 2019-02-23 MED ORDER — METHYLPREDNISOLONE ACETATE 40 MG/ML IJ SUSP
40.0000 mg | INTRAMUSCULAR | Status: AC | PRN
  Administered 2019-02-23: 15:00:00 40 mg via INTRA_ARTICULAR

## 2019-02-23 NOTE — Progress Notes (Signed)
Office Visit Note   Patient: Dawn Hayes           Date of Birth: [birth year]           MRN: [id number] Visit Date: 02/23/2019              Requested by: Midge Minium, MD [address] [place],  [place] [address] PCP: Midge Minium, MD   Assessment & Plan: Visit Diagnoses:  1. Chronic left shoulder pain     Plan: Impression as left shoulder pain suspect subacromial bursitis.  Subacromial injection performed today.  Have also given patient home exercises to start after her shoulder feels better.  Patient instructed to follow-up if she does not notice any improvement over the next couple weeks.  May have to consider an ultrasound-guided injection of the [facility] joint.  Follow-Up Instructions: Return if symptoms worsen or fail to improve.   Orders:  Orders Placed This Encounter  Procedures  . XR Shoulder Left   No orders of the defined types were placed in this encounter.     Procedures: Large Joint Inj: L subacromial bursa on 02/23/2019 2:40 PM Indications: pain Details: 22 G needle  Arthrogram: No  Medications: 3 mL lidocaine 1 %; 3 mL bupivacaine 0.5 %; 40 mg methylPREDNISolone acetate 40 MG/ML Outcome: tolerated well, no immediate complications Patient was prepped and draped in the usual sterile fashion.       Clinical Data: No additional findings.   Subjective: Chief Complaint  Patient presents with  . 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  Denies any radicular symptoms denies any numbness and tingling.  She does have a palpable mass at the top of her shoulder but this is asymptomatic.  She has taken some ibuprofen without relief.  She is also use ice and heat without significant relief.   Review of Systems  Constitutional: Negative.   HENT: Negative.   Eyes: Negative.   Respiratory: Negative.    Cardiovascular: Negative.   Endocrine: Negative.   Musculoskeletal: Negative.   Neurological: Negative.   Hematological: Negative.   Psychiatric/Behavioral: Negative.   All other systems reviewed and are negative.    Objective: Vital Signs: LMP 02/28/2016   Physical Exam Vitals signs and nursing note reviewed.  Constitutional:      Appearance: She is well-developed.  HENT:     Head: Normocephalic and atraumatic.  Neck:     Musculoskeletal: Neck supple.  Pulmonary:     Effort: Pulmonary effort is normal.  Abdominal:     Palpations: Abdomen is soft.  Skin:    General: Skin is warm.     Capillary Refill: Capillary refill takes less than 2 seconds.  Neurological:     Mental Status: She is alert and oriented to person, place, and time.  Psychiatric:        Behavior: Behavior normal.        Thought Content: Thought content normal.        Judgment: Judgment normal.     Ortho Exam Left shoulder exam shows full range of motion with mild to moderate pain at the extremes of range of motion.  Rotator cuff is normal to manual muscle testing.  Moderately positive impingement signs.  AC joint is nontender.  Positive cross body adduction sign.  Negative crank test.  Negative  O'Brien.  Negative Speed. Specialty Comments:  No specialty comments available.  Imaging: Xr Shoulder Left  Result Date: 02/23/2019 No acute or structural abnormalities    PMFS History: Patient Active Problem List   Diagnosis Date Noted  . Nausea 01/27/2018  . Bariatric surgery status 01/26/2018  . RLS (restless legs syndrome) 01/26/2018  . Vitamin D deficiency 11/10/2017  . Fibromyalgia 11/10/2017  . Anxiety and depression 05/27/2016  . Status post total abdominal hysterectomy 04/08/2016  . Costochondritis 12/04/2015  . Anemia, iron deficiency 10/10/2015  . Tick bites 10/10/2015  . Fatigue 10/10/2015  . Allergic rhinitis 09/07/2015  . Hemorrhoid   . Diverticulosis of colon without hemorrhage    . Hiatal hernia   . Rectal bleeding 05/08/2015  . Abnormal finding on EKG 09/16/2013  . Psoriatic arthritis ([place]) 09/16/2013  . HTN (hypertension) 09/16/2013  . GERD 12/11/2009  . DIARRHEA, CHRONIC 12/11/2009   Past Medical History:  Diagnosis Date  . Anemia   . Arthritis    psoriatic arthritis  . Costochondritis   . Diverticulitis   . Dysmenorrhea   . Fibromyalgia   . GERD (gastroesophageal reflux disease)   . Headache    Migraines  . History of hiatal hernia   . Hypertension   . Menorrhagia   . Psoriatic arthritis ([place])   . Smoker     Family History  Problem Relation Age of Onset  . Brain cancer Father   . Prostate cancer Father   . Hyperlipidemia Mother   . Colon cancer Maternal Grandmother 38  . Stroke Paternal Grandfather        COD  . Aneurysm Maternal Grandfather   . Cancer Other   . Hypertension Other   . Other Paternal Uncle        gastric polyps    Past Surgical History:  Procedure Laterality Date  . ABDOMINAL HYSTERECTOMY    . CARPAL TUNNEL RELEASE Left 01/19/2015   Procedure: LEFT CARPAL TUNNEL RELEASE;  Surgeon: Roseanne Kaufman, MD;  Location: [place];  Service: Orthopedics;  Laterality: Left;  . CHOLECYSTECTOMY    . CHOLECYSTECTOMY, LAPAROSCOPIC  2006  . COLONOSCOPY  1998   normal, [place]  . COLONOSCOPY N/A 05/30/2015   RMR: Minimal anal canal hemorrhoids. colonic diverticulosis. I suspect relatively trivial recent gI Bleed likely related to hemorrhoids. This finding alone would not likely adequatly explain her degree of anemia. she likely has a significent component from menstrual losses. , etc.   . COLONOSCOPY WITH PROPOFOL N/A 03/09/2017   Dr. Gala Romney: scattered diverticula, distal 5cm of TI normal. Grade II non-bleeding internal hemorrhoids. next TCS 10 years.   . CYSTOSCOPY N/A 04/08/2016   Procedure: CYSTOSCOPY;  Surgeon: Nunzio Cobbs, MD;  Location: [facility];  Service: Gynecology;  Laterality: N/A;  . DILATATION &  CURETTAGE/HYSTEROSCOPY WITH MYOSURE N/A 01/04/2016   Procedure: DILATATION & CURETTAGE/HYSTEROSCOPY;  Surgeon: Nunzio Cobbs, MD;  Location: [facility];  Service: Gynecology;  Laterality: N/A;  . ESOPHAGOGASTRODUODENOSCOPY  09/11/99   tiny esophageal erosions with mild erosive reflux/no barrett's/normal stomach  . ESOPHAGOGASTRODUODENOSCOPY N/A 05/30/2015   RMR: Hiatal hernia otherwise negative EGD  . GASTRIC ROUX-EN-Y N/A 01/19/2018   Procedure: LAPAROSCOPIC ROUX-EN-Y GASTRIC BYPASS WITH HIATAL HERNIA REPAIR, WITH UPPER ENDOSCOPY, ERAS Pathway;  Surgeon: Greer Pickerel, MD;  [facility];  Service: General;  Laterality: N/A;  . GIVENS CAPSULE STUDY N/A 08/14/2017   Procedure: GIVENS CAPSULE STUDY;  Surgeon: Daneil Dolin, MD;  [facility];  Service: Endoscopy;  Laterality: N/A;  7:30am  . HYSTERECTOMY ABDOMINAL WITH SALPINGECTOMY Bilateral 04/08/2016   Procedure: HYSTERECTOMY ABDOMINAL WITH SALPINGECTOMY;  Surgeon: Nunzio Cobbs, MD;  Location: [facility];  Service: Gynecology;  Laterality: Bilateral;  . LAPAROSCOPIC BILATERAL SALPINGECTOMY N/A 04/08/2016   Procedure: LAPAROSCOPIC BILATERAL SALPINGECTOMY possible BSO;  Surgeon: Nunzio Cobbs, MD;  Location: [facility];  Service: Gynecology;  Laterality: N/A;  . LAPAROSCOPIC HYSTERECTOMY N/A 04/08/2016   Procedure: ATTEMPTED HYSTERECTOMY TOTAL LAPAROSCOPIC;  Surgeon: Nunzio Cobbs, MD;  Location: [facility];  Service: Gynecology;  Laterality: N/A;  . LAPAROSCOPIC LYSIS OF ADHESIONS  04/08/2016   Procedure: LAPAROSCOPIC LYSIS OF ADHESIONS;  Surgeon: Nunzio Cobbs, MD;  Location: [facility];  Service: Gynecology;;  . LAPAROSCOPIC LYSIS OF ADHESIONS N/A 01/19/2018   Procedure: LAPAROSCOPIC LYSIS OF ADHESIONS;  Surgeon: Greer Pickerel, MD;  [facility];  Service: General;  Laterality: N/A;   Social History   Occupational History  . Occupation: [employer], [place]  Tobacco Use  . Smoking  status: Light Tobacco Smoker    Packs/day: 0.10    Years: 20.00    Pack years: 2.00    Types: Cigarettes  . Smokeless tobacco: Never Used  . Tobacco comment: stopped smoking 6 months ago   Substance and Sexual Activity  . Alcohol use: Yes    Alcohol/week: 16.0 standard drinks    Types: 6 Glasses of wine, 10 Standard drinks or equivalent per week    Comment: socially, weekends 4-5  . Drug use: No  . Sexual activity: Yes    Partners: Male    Birth control/protection: None

## 2019-02-25 ENCOUNTER — Other Ambulatory Visit: Payer: Self-pay | Admitting: Family Medicine

## 2019-02-25 ENCOUNTER — Encounter: Payer: Self-pay | Admitting: *Deleted

## 2019-02-25 DIAGNOSIS — R748 Abnormal levels of other serum enzymes: Secondary | ICD-10-CM

## 2019-03-03 ENCOUNTER — Ambulatory Visit
Admission: RE | Admit: 2019-03-03 | Discharge: 2019-03-03 | Disposition: A | Discharge status: Home / Self Care | Payer: 59 | Source: Ambulatory Visit | Attending: Family Medicine | Admitting: Family Medicine

## 2019-03-03 ENCOUNTER — Other Ambulatory Visit: Payer: Self-pay | Admitting: Family Medicine

## 2019-03-03 DIAGNOSIS — R9389 Abnormal findings on diagnostic imaging of other specified body structures: Secondary | ICD-10-CM

## 2019-03-03 DIAGNOSIS — R748 Abnormal levels of other serum enzymes: Secondary | ICD-10-CM

## 2019-03-03 DIAGNOSIS — R160 Hepatomegaly, not elsewhere classified: Secondary | ICD-10-CM

## 2019-03-03 DIAGNOSIS — R1907 Generalized intra-abdominal and pelvic swelling, mass and lump: Secondary | ICD-10-CM

## 2019-03-09 ENCOUNTER — Other Ambulatory Visit: Payer: Self-pay

## 2019-03-09 ENCOUNTER — Encounter: Payer: Self-pay | Admitting: Family Medicine

## 2019-03-09 ENCOUNTER — Other Ambulatory Visit: Payer: Self-pay | Admitting: Family Medicine

## 2019-03-09 ENCOUNTER — Ambulatory Visit (HOSPITAL_COMMUNITY)
Admission: RE | Admit: 2019-03-09 | Discharge: 2019-03-09 | Disposition: A | Discharge status: Home / Self Care | Payer: 59 | Source: Ambulatory Visit | Attending: Family Medicine | Admitting: Family Medicine

## 2019-03-09 DIAGNOSIS — R9389 Abnormal findings on diagnostic imaging of other specified body structures: Secondary | ICD-10-CM | POA: Insufficient documentation

## 2019-03-09 DIAGNOSIS — R1907 Generalized intra-abdominal and pelvic swelling, mass and lump: Secondary | ICD-10-CM | POA: Diagnosis present

## 2019-03-09 DIAGNOSIS — R160 Hepatomegaly, not elsewhere classified: Secondary | ICD-10-CM

## 2019-03-09 MED ORDER — GADOXETATE DISODIUM 0.25 MMOL/ML IV SOLN
7.0000 mL | Freq: Once | INTRAVENOUS | Status: AC | PRN
  Administered 2019-03-09: 7 mL via INTRAVENOUS

## 2019-03-14 ENCOUNTER — Encounter: Payer: Self-pay | Admitting: Family Medicine

## 2019-03-15 ENCOUNTER — Encounter: Payer: Self-pay | Admitting: Family Medicine

## 2019-03-16 ENCOUNTER — Encounter: Payer: Self-pay | Admitting: Family Medicine

## 2019-03-24 ENCOUNTER — Other Ambulatory Visit: Payer: 59

## 2019-03-30 ENCOUNTER — Encounter: Payer: Self-pay | Admitting: Family Medicine

## 2019-03-31 ENCOUNTER — Encounter: Payer: Self-pay | Admitting: Family Medicine

## 2019-03-31 ENCOUNTER — Telehealth: Payer: Self-pay | Admitting: Family Medicine

## 2019-03-31 NOTE — Telephone Encounter (Signed)
I have placed FMLA forms in the bin with a charge sheet, pt sent a mychart message to Birdie Riddle explaining why she is asking for this.

## 2019-03-31 NOTE — Telephone Encounter (Signed)
Paperwork given to PCP for completion.  

## 2019-04-01 ENCOUNTER — Encounter: Payer: Self-pay | Admitting: Family Medicine

## 2019-04-11 ENCOUNTER — Encounter: Payer: Self-pay | Admitting: Family Medicine

## 2019-04-12 ENCOUNTER — Encounter: Payer: Self-pay | Admitting: General Practice

## 2019-04-12 DIAGNOSIS — Z0279 Encounter for issue of other medical certificate: Secondary | ICD-10-CM

## 2019-04-12 NOTE — Telephone Encounter (Signed)
I can not locate this paperwork.  Please ask if pt is able to fax or upload forms to MyChart for me to complete.

## 2019-04-12 NOTE — Telephone Encounter (Signed)
mychart message sent to pt

## 2019-04-13 ENCOUNTER — Encounter: Payer: Self-pay | Admitting: Family Medicine

## 2019-04-14 ENCOUNTER — Encounter: Payer: Self-pay | Admitting: Family Medicine

## 2019-04-22 ENCOUNTER — Other Ambulatory Visit: Payer: Self-pay | Admitting: General Practice

## 2019-04-22 MED ORDER — DULOXETINE HCL 60 MG PO CPEP: 60.0000 mg | ORAL_CAPSULE | Freq: Every day | ORAL | 1 refills | Status: DC

## 2019-04-22 NOTE — Telephone Encounter (Signed)
Please advise. This states that it was last filled by a historical provider.

## 2019-04-27 ENCOUNTER — Encounter: Payer: Self-pay | Admitting: Internal Medicine

## 2019-05-16 ENCOUNTER — Encounter: Payer: Self-pay | Admitting: Family Medicine

## 2019-05-25 ENCOUNTER — Other Ambulatory Visit: Payer: Self-pay | Admitting: Nurse Practitioner

## 2019-05-25 DIAGNOSIS — K769 Liver disease, unspecified: Secondary | ICD-10-CM

## 2019-06-08 ENCOUNTER — Telehealth: Payer: Self-pay

## 2019-06-08 ENCOUNTER — Emergency Department (HOSPITAL_COMMUNITY)
Admission: EM | Admit: 2019-06-08 | Discharge: 2019-06-08 | Disposition: A | Discharge status: Home / Self Care | Payer: 59 | Attending: Emergency Medicine | Admitting: Emergency Medicine

## 2019-06-08 ENCOUNTER — Emergency Department (HOSPITAL_COMMUNITY): Payer: 59

## 2019-06-08 ENCOUNTER — Encounter (HOSPITAL_COMMUNITY): Payer: Self-pay | Admitting: *Deleted

## 2019-06-08 ENCOUNTER — Other Ambulatory Visit: Payer: Self-pay

## 2019-06-08 DIAGNOSIS — Z79899 Other long term (current) drug therapy: Secondary | ICD-10-CM | POA: Insufficient documentation

## 2019-06-08 DIAGNOSIS — I10 Essential (primary) hypertension: Secondary | ICD-10-CM | POA: Insufficient documentation

## 2019-06-08 DIAGNOSIS — F17210 Nicotine dependence, cigarettes, uncomplicated: Secondary | ICD-10-CM | POA: Diagnosis not present

## 2019-06-08 DIAGNOSIS — F419 Anxiety disorder, unspecified: Secondary | ICD-10-CM | POA: Diagnosis not present

## 2019-06-08 DIAGNOSIS — R0789 Other chest pain: Secondary | ICD-10-CM | POA: Diagnosis present

## 2019-06-08 DIAGNOSIS — M940 Chondrocostal junction syndrome [Tietze]: Secondary | ICD-10-CM | POA: Insufficient documentation

## 2019-06-08 LAB — COMPREHENSIVE METABOLIC PANEL
ALT: 91 U/L — ABNORMAL HIGH (ref 0–44)
AST: 67 U/L — ABNORMAL HIGH (ref 15–41)
Albumin: 4 g/dL (ref 3.5–5.0)
Alkaline Phosphatase: 126 U/L (ref 38–126)
Anion gap: 10 (ref 5–15)
BUN: 10 mg/dL (ref 6–20)
CO2: 24 mmol/L (ref 22–32)
Calcium: 9.8 mg/dL (ref 8.9–10.3)
Chloride: 108 mmol/L (ref 98–111)
Creatinine, Ser: 0.87 mg/dL (ref 0.44–1.00)
GFR calc Af Amer: >60 mL/min (ref >60)
GFR calc non Af Amer: >60 mL/min (ref >60)
Glucose, Bld: 81 mg/dL (ref 70–99)
Potassium: 3.6 mmol/L (ref 3.5–5.1)
Sodium: 142 mmol/L (ref 135–145)
Total Bilirubin: 0.6 mg/dL (ref 0.3–1.2)
Total Protein: 7.1 g/dL (ref 6.5–8.1)

## 2019-06-08 LAB — URINALYSIS, ROUTINE W REFLEX MICROSCOPIC
Bilirubin Urine: NEGATIVE
Glucose, UA: NEGATIVE mg/dL
Hgb urine dipstick: NEGATIVE
Ketones, ur: NEGATIVE mg/dL
Leukocytes,Ua: NEGATIVE
Nitrite: NEGATIVE
Protein, ur: NEGATIVE mg/dL
Specific Gravity, Urine: 1.004 — ABNORMAL LOW (ref 1.005–1.030)
pH: 6 (ref 5.0–8.0)

## 2019-06-08 LAB — CBC WITH DIFFERENTIAL/PLATELET
Abs Immature Granulocytes: 0.01 10*3/uL (ref 0.00–0.07)
Basophils Absolute: 0.1 10*3/uL (ref 0.0–0.1)
Basophils Relative: 1 %
Eosinophils Absolute: 0.2 10*3/uL (ref 0.0–0.5)
Eosinophils Relative: 3 %
HCT: 45.9 % (ref 36.0–46.0)
Hemoglobin: 15.3 g/dL — ABNORMAL HIGH (ref 12.0–15.0)
Immature Granulocytes: 0 %
Lymphocytes Relative: 42 %
Lymphs Abs: 2.7 10*3/uL (ref 0.7–4.0)
MCH: 33 pg (ref 26.0–34.0)
MCHC: 33.3 g/dL (ref 30.0–36.0)
MCV: 98.9 fL (ref 80.0–100.0)
Monocytes Absolute: 0.5 10*3/uL (ref 0.1–1.0)
Monocytes Relative: 8 %
Neutro Abs: 2.9 10*3/uL (ref 1.7–7.7)
Neutrophils Relative %: 46 %
Platelets: 214 10*3/uL (ref 150–400)
RBC: 4.64 MIL/uL (ref 3.87–5.11)
RDW: 12.6 % (ref 11.5–15.5)
WBC: 6.4 10*3/uL (ref 4.0–10.5)
nRBC: 0 % (ref 0.0–0.2)

## 2019-06-08 LAB — TROPONIN I (HIGH SENSITIVITY)
Troponin I (High Sensitivity): <2 ng/L (ref <18)
Troponin I (High Sensitivity): <2 ng/L (ref <18)

## 2019-06-08 MED ORDER — PREDNISONE 10 MG (21) PO TBPK: ORAL_TABLET | Freq: Every day | ORAL | 0 refills | Status: DC

## 2019-06-08 MED ORDER — ONDANSETRON HCL 4 MG/2ML IJ SOLN
4.0000 mg | Freq: Once | INTRAMUSCULAR | Status: AC
  Administered 2019-06-08: 20:00:00 4 mg via INTRAVENOUS
  Filled 2019-06-08: qty 2

## 2019-06-08 MED ORDER — MORPHINE SULFATE (PF) 4 MG/ML IV SOLN
4.0000 mg | Freq: Once | INTRAVENOUS | Status: AC
  Administered 2019-06-08: 4 mg via INTRAVENOUS
  Filled 2019-06-08: qty 1

## 2019-06-08 MED ORDER — METHYLPREDNISOLONE SODIUM SUCC 125 MG IJ SOLR
125.0000 mg | Freq: Once | INTRAMUSCULAR | Status: AC
  Administered 2019-06-08: 125 mg via INTRAVENOUS
  Filled 2019-06-08: qty 2

## 2019-06-08 MED ORDER — HYDROMORPHONE HCL 1 MG/ML IJ SOLN
1.0000 mg | Freq: Once | INTRAMUSCULAR | Status: AC
  Administered 2019-06-08: 1 mg via INTRAVENOUS
  Filled 2019-06-08: qty 1

## 2019-06-08 NOTE — Telephone Encounter (Signed)
Patient is aware that visit is virtual per PCP

## 2019-06-08 NOTE — ED Triage Notes (Signed)
Pt c/o right side chest pain that started yesterday and has gotten progressively worse; pt states her chest hurts when she takes a deep breath and when she walks; pt had a remacaid infusion at 1pm yesterday and is unsure if this chest pain is related

## 2019-06-08 NOTE — ED Provider Notes (Signed)
[facility] EMERGENCY DEPARTMENT Provider Note   CSN: [id number] Arrival date & time: 06/08/19  1855     History Chief Complaint  Patient presents with  . Chest Pain    Dawn Hayes is a 49 y.o. female with past medical history significant for anemia, psoriatic arthritis, costochondritis, fibromyalgia, GERD, hypertension presents emergency department today with chief complaint of chest pain x2 days.  Patient states the pain started yesterday after her Remicade infusion.  She states she is currently on Remicade infusions for her psoriatic arthritis, she has been getting them for the past year. She was sitting at home working on the computer when the chest pain started.  The pain has been intermittent but has progressively worsened since onset.  She states the pain is located in the center of her chest and radiates underneath her right breast, this feels like her costochondritis in the past.  Describes the pain as sharp.  She rates the pain 10 out of 10 in severity.  She states pain is worse when taking a deep breath.  She has not taken any medications for symptoms prior to arrival.  She denies fever, chills, dyspnea on exertion, abdominal pain, nausea, vomiting, history of clotting disorder, recent immobilization/surgery/travel, leg swelling.  She does smoke but is trying to quit and has recently cut back. Denies alcohol and drug use. She denies family history of cardiac disease. She has history of hypertension but states she has not been on medications in years and her blood pressure has been well controlled.  Surgical history includes cholecystectomy.    Past Medical History:  Diagnosis Date  . Anemia   . Arthritis    psoriatic arthritis  . Costochondritis   . Diverticulitis   . Dysmenorrhea   . Fibromyalgia   . GERD (gastroesophageal reflux disease)   . Headache    Migraines  . History of hiatal hernia   . Hypertension   . Menorrhagia   . Psoriatic arthritis ([place])   . Smoker      Patient Active Problem List   Diagnosis Date Noted  . Nausea 01/27/2018  . Bariatric surgery status 01/26/2018  . RLS (restless legs syndrome) 01/26/2018  . Vitamin D deficiency 11/10/2017  . Fibromyalgia 11/10/2017  . Anxiety and depression 05/27/2016  . Status post total abdominal hysterectomy 04/08/2016  . Costochondritis 12/04/2015  . Anemia, iron deficiency 10/10/2015  . Tick bites 10/10/2015  . Fatigue 10/10/2015  . Allergic rhinitis 09/07/2015  . Hemorrhoid   . Diverticulosis of colon without hemorrhage   . Hiatal hernia   . Rectal bleeding 05/08/2015  . Abnormal finding on EKG 09/16/2013  . Psoriatic arthritis ([place]) 09/16/2013  . HTN (hypertension) 09/16/2013  . GERD 12/11/2009  . DIARRHEA, CHRONIC 12/11/2009    Past Surgical History:  Procedure Laterality Date  . ABDOMINAL HYSTERECTOMY    . CARPAL TUNNEL RELEASE Left 01/19/2015   Procedure: LEFT CARPAL TUNNEL RELEASE;  Surgeon: Roseanne Kaufman, MD;  Location: [facility];  Service: Orthopedics;  Laterality: Left;  . CHOLECYSTECTOMY    . CHOLECYSTECTOMY, LAPAROSCOPIC  2006  . COLONOSCOPY  1998   normal, [place]  . COLONOSCOPY N/A 05/30/2015   RMR: Minimal anal canal hemorrhoids. colonic diverticulosis. I suspect relatively trivial recent gI Bleed likely related to hemorrhoids. This finding alone would not likely adequatly explain her degree of anemia. she likely has a significent component from menstrual losses. , etc.   . COLONOSCOPY WITH PROPOFOL N/A 03/09/2017   Dr. Gala Romney: scattered diverticula,  distal 5cm of TI normal. Grade II non-bleeding internal hemorrhoids. next TCS 10 years.   . CYSTOSCOPY N/A 04/08/2016   Procedure: CYSTOSCOPY;  Surgeon: Nunzio Cobbs, MD;  Location: [facility];  Service: Gynecology;  Laterality: N/A;  . DILATATION & CURETTAGE/HYSTEROSCOPY WITH MYOSURE N/A 01/04/2016   Procedure: DILATATION & CURETTAGE/HYSTEROSCOPY;  Surgeon: Nunzio Cobbs, MD;   Location: [facility];  Service: Gynecology;  Laterality: N/A;  . ESOPHAGOGASTRODUODENOSCOPY  09/11/99   tiny esophageal erosions with mild erosive reflux/no barrett's/normal stomach  . ESOPHAGOGASTRODUODENOSCOPY N/A 05/30/2015   RMR: Hiatal hernia otherwise negative EGD  . GASTRIC ROUX-EN-Y N/A 01/19/2018   Procedure: LAPAROSCOPIC ROUX-EN-Y GASTRIC BYPASS WITH HIATAL HERNIA REPAIR, WITH UPPER ENDOSCOPY, ERAS Pathway;  Surgeon: Greer Pickerel, MD;  [facility];  Service: General;  Laterality: N/A;  . GIVENS CAPSULE STUDY N/A 08/14/2017   Procedure: GIVENS CAPSULE STUDY;  Surgeon: Daneil Dolin, MD;  [facility];  Service: Endoscopy;  Laterality: N/A;  7:30am  . HYSTERECTOMY ABDOMINAL WITH SALPINGECTOMY Bilateral 04/08/2016   Procedure: HYSTERECTOMY ABDOMINAL WITH SALPINGECTOMY;  Surgeon: Nunzio Cobbs, MD;  Location: [facility];  Service: Gynecology;  Laterality: Bilateral;  . LAPAROSCOPIC BILATERAL SALPINGECTOMY N/A 04/08/2016   Procedure: LAPAROSCOPIC BILATERAL SALPINGECTOMY possible BSO;  Surgeon: Nunzio Cobbs, MD;  Location: [facility];  Service: Gynecology;  Laterality: N/A;  . LAPAROSCOPIC HYSTERECTOMY N/A 04/08/2016   Procedure: ATTEMPTED HYSTERECTOMY TOTAL LAPAROSCOPIC;  Surgeon: Nunzio Cobbs, MD;  Location: [facility];  Service: Gynecology;  Laterality: N/A;  . LAPAROSCOPIC LYSIS OF ADHESIONS  04/08/2016   Procedure: LAPAROSCOPIC LYSIS OF ADHESIONS;  Surgeon: Nunzio Cobbs, MD;  Location: [facility];  Service: Gynecology;;  . LAPAROSCOPIC LYSIS OF ADHESIONS N/A 01/19/2018   Procedure: LAPAROSCOPIC LYSIS OF ADHESIONS;  Surgeon: Greer Pickerel, MD;  [facility];  Service: General;  Laterality: N/A;     OB History    Gravida  0   Para      Term      Preterm      AB      Living        SAB      TAB      Ectopic      Multiple      Live Births              Family History  Problem Relation Age of Onset  . Brain cancer Father     . Prostate cancer Father   . Hyperlipidemia Mother   . Colon cancer Maternal Grandmother 56  . Stroke Paternal Grandfather        COD  . Aneurysm Maternal Grandfather   . Cancer Other   . Hypertension Other   . Other Paternal Uncle        gastric polyps    Social History   Tobacco Use  . Smoking status: Light Tobacco Smoker    Packs/day: 0.10    Years: 20.00    Pack years: 2.00    Types: Cigarettes  . Smokeless tobacco: Never Used  . Tobacco comment: stopped smoking 6 months ago   Substance Use Topics  . Alcohol use: Yes    Alcohol/week: 16.0 standard drinks    Types: 6 Glasses of wine, 10 Standard drinks or equivalent per week    Comment: socially, weekends 4-5  . Drug use: No    Home Medications Prior to Admission medications  Medication Sig Start Date End Date Taking? Authorizing Provider  ALPRAZolam Duanne Moron) 0.5 MG tablet Take 1 tablet (0.5 mg total) by mouth 2 (two) times daily as needed for anxiety. 08/11/18  Yes Midge Minium, MD  BELBUCA 300 MCG FILM Take 1 Film by mouth 2 (two) times daily.  12/06/18  Yes [provider]  diclofenac sodium (VOLTAREN) 1 % GEL Apply 2-4 g topically 4 (four) times daily as needed (for muscle/joint pain.).  09/25/17  Yes [provider]  DULoxetine (CYMBALTA) 60 MG capsule Take 1 capsule (60 mg total) by mouth daily. 04/22/19  Yes Midge Minium, MD  folic acid (FOLVITE) 1 MG tablet Take 1 tablet by mouth daily. 07/07/18  Yes [provider]  gabapentin (NEURONTIN) 600 MG tablet TAKE 1 TABLET(600 MG) BY MOUTH AT BEDTIME Patient taking differently: Take 600 mg by mouth at bedtime.  08/31/18  Yes Midge Minium, MD  hydroxychloroquine (PLAQUENIL) 200 MG tablet Take 200 mg by mouth daily.  02/15/19  Yes [provider]  OVER THE COUNTER MEDICATION Take 1 tablet by mouth daily. Bariatric Vitamin   Yes [provider]  Oxycodone HCl 10 MG TABS Take 10 mg by mouth 3 (three) times daily  as needed. 05/19/19  Yes [provider]  RASUVO 10 MG/0.2ML SOAJ Inject into the skin every 7 (seven) days.  09/14/18  Yes [provider]  predniSONE (STERAPRED UNI-PAK 21 TAB) 10 MG (21) TBPK tablet Take by mouth daily. Take 6 tabs by mouth daily  for 2 days, then 5 tabs for 2 days, then 4 tabs for 2 days, then 3 tabs for 2 days, 2 tabs for 2 days, then 1 tab by mouth daily for 2 days 06/08/19   Chioma Mukherjee, Verline Lema E, PA-C    Allergies    Aspirin and Adhesive [tape]  Review of Systems   Review of Systems All other systems are reviewed and are negative for acute change except as noted in the HPI.   Physical Exam Updated Vital Signs BP 117/83 (BP Location: Right Arm)   Pulse 93   Temp 98 F (36.7 C) (Oral)   Resp 20   Ht 5' 1.5" (1.562 m)   Wt 68 kg   LMP 02/28/2016   SpO2 100%   BMI 27.88 kg/m   Physical Exam Vitals and nursing note reviewed.  Constitutional:      General: She is not in acute distress.    Appearance: She is not ill-appearing, toxic-appearing or diaphoretic.  HENT:     Head: Normocephalic and atraumatic.     Right Ear: Tympanic membrane and external ear normal.     Left Ear: Tympanic membrane and external ear normal.     Nose: Nose normal.     Mouth/Throat:     Mouth: Mucous membranes are moist.     Pharynx: Oropharynx is clear.  Eyes:     General: No scleral icterus.       Right eye: No discharge.        Left eye: No discharge.     Extraocular Movements: Extraocular movements intact.     Conjunctiva/sclera: Conjunctivae normal.     Pupils: Pupils are equal, round, and reactive to light.  Neck:     Vascular: No JVD.  Cardiovascular:     Rate and Rhythm: Normal rate and regular rhythm.     Pulses: Normal pulses.          Radial pulses are 2+ on the right side  and 2+ on the left side.     Heart sounds: Normal heart sounds.  Pulmonary:     Comments: Lungs clear to auscultation in all fields. Symmetric chest rise. No wheezing, rales, or  rhonchi. Chest:     Chest wall: Tenderness present.  Abdominal:     Comments: Abdomen is soft, non-distended, and non-tender in all quadrants. No rigidity, no guarding. No peritoneal signs.  Musculoskeletal:        General: Normal range of motion.     Cervical back: Normal range of motion.     Comments: Homans sign absent bilaterally, no lower extremity edema, no palpable cords, compartments are soft  Skin:    General: Skin is warm and dry.     Capillary Refill: Capillary refill takes less than 2 seconds.  Neurological:     Mental Status: She is oriented to person, place, and time.     GCS: GCS eye subscore is 4. GCS verbal subscore is 5. GCS motor subscore is 6.     Comments: Fluent speech, no facial droop.  Psychiatric:        Mood and Affect: Mood is anxious. Affect is tearful.        Behavior: Behavior normal.       ED Results / Procedures / Treatments   Labs (all labs ordered are listed, but only abnormal results are displayed) Labs Reviewed  CBC WITH DIFFERENTIAL/PLATELET - Abnormal; Notable for the following components:      Result Value   Hemoglobin 15.3 (*)    All other components within normal limits  COMPREHENSIVE METABOLIC PANEL - Abnormal; Notable for the following components:   AST 67 (*)    ALT 91 (*)    All other components within normal limits  URINALYSIS, ROUTINE W REFLEX MICROSCOPIC - Abnormal; Notable for the following components:   Specific Gravity, Urine 1.004 (*)    All other components within normal limits  TROPONIN I (HIGH SENSITIVITY)  TROPONIN I (HIGH SENSITIVITY)    EKG EKG Interpretation  Date/Time:  Wednesday June 08 2019 19:28:48 EST Ventricular Rate:  92 PR Interval:    QRS Duration: 86 QT Interval:  382 QTC Calculation: 476 R Axis:   -50 Text Interpretation: Sinus rhythm Left anterior fascicular block Anteroseptal infarct, age indeterminate No acute changes No significant change since last tracing Confirmed by Varney Biles  [phone]) on 06/08/2019 7:32:35 PM   Radiology DG Chest Portable 1 View  Result Date: 06/08/2019 CLINICAL DATA:  Worsening right-sided chest pain. EXAM: PORTABLE CHEST 1 VIEW COMPARISON:  November 30, 2018 FINDINGS: The heart size and mediastinal contours are within normal limits. Both lungs are clear. The visualized skeletal structures are unremarkable. Radiopaque surgical clips are seen overlying the right upper quadrant. IMPRESSION: No active disease. Electronically Signed   By: Virgina Norfolk M.D.   On: 06/08/2019 20:24    Procedures Procedures (including critical care time)  Medications Ordered in ED Medications  morphine 4 MG/ML injection 4 mg (4 mg Intravenous Given 06/08/19 1954)  ondansetron (ZOFRAN) injection 4 mg (4 mg Intravenous Given 06/08/19 1954)  methylPREDNISolone sodium succinate (SOLU-MEDROL) 125 mg/2 mL injection 125 mg (125 mg Intravenous Given 06/08/19 1954)  HYDROmorphone (DILAUDID) injection 1 mg (1 mg Intravenous Given 06/08/19 2049)    ED Course  I have reviewed the triage vital signs and the nursing notes.  Pertinent labs & imaging results that were available during my care of the patient were reviewed by me and considered in my medical decision  making (see chart for details).    MDM Rules/Calculators/A&P                      Patient presents to the emergency department with chest pain. Patient nontoxic appearing, in no apparent distress, vitals without significant abnormality. Fairly benign physical exam. DDX: ACS, pulmonary embolism, dissection, pneumothorax, effusion, infiltrate, arrhythmia, anemia, electrolyte derangement, MSK. Evaluation initiated with labs, EKG, and CXR. Patient on cardiac monitor.   Work-up in the ER unremarkable. Labs reviewed, no leukocytosis, anemia, or significant electrolyte abnormality. CXR without infiltrate, effusion, pneumothorax, or fracture/dislocation. She has elevated liver enzymes today with AST 67 and ALT 91. She is currently  being worked up outpatient for  Elevated liver enzymes, she has an appointment in [place] with a specialist on 06/20/2019. Recommend she discuss the elevated enzymes at that visit.   She has a history of cholecystectomy.  Very reassuring vitals and labs feel that, common bile stone is unlikely cause of pain.  Pain improved after IV morphine, IV Dilaudid and IV Solu-Medrol.  She is resting comfortably on reassessment.  Low risk heart score of 2, EKG without obvious ischemia, delta troponin negative, doubt ACS. Patient is low risk wells, PERC negative, doubt pulmonary embolism. Pain is not a tearing sensation, symmetric pulses, no widening of mediastinum on CXR, doubt dissection.  Given her negative cardiac work-up will discharge home with steroid taper to treat chondralchondritis flare.  Patient has appeared hemodynamically stable throughout ER visit and appears safe for discharge with close PCP follow up. I discussed results, treatment plan, need for PCP follow-up, and return precautions with the patient. Provided opportunity for questions, patient confirmed understanding and is in agreement with plan. Findings and plan of care discussed with supervising physician Dr. Kathrynn Humble.      Final Clinical Impression(s) / ED Diagnoses Final diagnoses:  Costochondritis, acute    Rx / DC Orders ED Discharge Orders         Ordered    predniSONE (STERAPRED UNI-PAK 21 TAB) 10 MG (21) TBPK tablet  Daily     06/08/19 2212           Cherre Robins, PA-C 06/08/19 2224    Varney Biles, MD 06/08/19 2348

## 2019-06-08 NOTE — Telephone Encounter (Signed)
Virtual Visit please

## 2019-06-08 NOTE — Discharge Instructions (Signed)
You have been seen today for chest pain. Please read and follow all provided instructions. Return to the emergency room for worsening condition or new concerning symptoms.    Your liver enzymes were elevated today.  Your AST: 67 and ALT:91  1. Medications:  Prescription sent to your pharmacy for prednisone taper.  Please take this as prescribed. Continue usual home medications Take medications as prescribed. Please review all of the medicines and only take them if you do not have an allergy to them.   2. Treatment: rest, drink plenty of fluids  3. Follow Up:  Please follow up with primary care provider by scheduling an appointment as soon as possible for a visit  -Also recommend you keep your appointment scheduled with your liver doctor for follow-up of your elevated liver enzymes as we discussed today.   It is also a possibility that you have an allergic reaction to any of the medicines that you have been prescribed - Everybody reacts differently to medications and while MOST people have no trouble with most medicines, you may have a reaction such as nausea, vomiting, rash, swelling, shortness of breath. If this is the case, please stop taking the medicine immediately and contact your physician.  ?

## 2019-06-08 NOTE — Telephone Encounter (Signed)
Patient is scheduled Friday for right arm pain and numbness. VV or in office? Please advise

## 2019-06-10 ENCOUNTER — Encounter: Payer: Self-pay | Admitting: Family Medicine

## 2019-06-10 ENCOUNTER — Other Ambulatory Visit: Payer: Self-pay

## 2019-06-10 ENCOUNTER — Ambulatory Visit (INDEPENDENT_AMBULATORY_CARE_PROVIDER_SITE_OTHER): Payer: 59 | Admitting: Family Medicine

## 2019-06-10 DIAGNOSIS — M940 Chondrocostal junction syndrome [Tietze]: Secondary | ICD-10-CM | POA: Diagnosis not present

## 2019-06-10 DIAGNOSIS — G5601 Carpal tunnel syndrome, right upper limb: Secondary | ICD-10-CM

## 2019-06-10 NOTE — Progress Notes (Signed)
I have discussed the procedure for the virtual visit with the patient who has given consent to proceed with assessment and treatment.   Pt unable to obtain vitals.   Amato Sevillano L Caitlin Ainley, CMA     

## 2019-06-10 NOTE — Progress Notes (Signed)
Virtual Visit via Video   I connected with patient on 06/10/19 at  9:30 AM EST by a video enabled telemedicine application and verified that I am speaking with the correct person using two identifiers.  Location patient: Home Location provider: [facility], Office Persons participating in the virtual visit: Patient, Provider, [place] (Jess B)  I discussed the limitations of evaluation and management by telemedicine and the availability of in person appointments. The patient expressed understanding and agreed to proceed.  Subjective:   HPI:   ER f/u- went to ER on 1/27 for chest pain, 'like my costochondritis'.  Had negative cardiac workup.  Liver enzymes were elevated (AST 67/ALT 91).  Has a liver specialist- MRI on 2/8 and appt on 2/11.  Was given Prednisone taper and sxs are improving.  Carpal tunnel- reports sxs are worse in the AM despite wearing the brace.  R hand.  'I can't feel my fingers at all' and 'goes all the way up my arm'.  Had surgery on L hand.  Pt is interested in referral.  ROS:   See pertinent positives and negatives per HPI.  Patient Active Problem List   Diagnosis Date Noted  . Nausea 01/27/2018  . Bariatric surgery status 01/26/2018  . RLS (restless legs syndrome) 01/26/2018  . Vitamin D deficiency 11/10/2017  . Fibromyalgia 11/10/2017  . Anxiety and depression 05/27/2016  . Status post total abdominal hysterectomy 04/08/2016  . Costochondritis 12/04/2015  . Anemia, iron deficiency 10/10/2015  . Tick bites 10/10/2015  . Fatigue 10/10/2015  . Allergic rhinitis 09/07/2015  . Hemorrhoid   . Diverticulosis of colon without hemorrhage   . Hiatal hernia   . Rectal bleeding 05/08/2015  . Abnormal finding on EKG 09/16/2013  . Psoriatic arthritis ([place]) 09/16/2013  . HTN (hypertension) 09/16/2013  . GERD 12/11/2009  . DIARRHEA, CHRONIC 12/11/2009    Social History   Tobacco Use  . Smoking status: Light Tobacco Smoker    Packs/day: 0.10    Years:  20.00    Pack years: 2.00    Types: Cigarettes  . Smokeless tobacco: Never Used  . Tobacco comment: stopped smoking 6 months ago   Substance Use Topics  . Alcohol use: Yes    Alcohol/week: 16.0 standard drinks    Types: 6 Glasses of wine, 10 Standard drinks or equivalent per week    Comment: socially, weekends 4-5    Current Outpatient Medications:  .  ALPRAZolam (XANAX) 0.5 MG tablet, Take 1 tablet (0.5 mg total) by mouth 2 (two) times daily as needed for anxiety., Disp: 30 tablet, Rfl: 1 .  BELBUCA 300 MCG FILM, Take 1 Film by mouth 2 (two) times daily. , Disp: , Rfl:  .  diclofenac sodium (VOLTAREN) 1 % GEL, Apply 2-4 g topically 4 (four) times daily as needed (for muscle/joint pain.). , Disp: , Rfl: 0 .  DULoxetine (CYMBALTA) 60 MG capsule, Take 1 capsule (60 mg total) by mouth daily., Disp: 90 capsule, Rfl: 1 .  folic acid (FOLVITE) 1 MG tablet, Take 1 tablet by mouth daily., Disp: , Rfl:  .  gabapentin (NEURONTIN) 600 MG tablet, TAKE 1 TABLET(600 MG) BY MOUTH AT BEDTIME (Patient taking differently: Take 600 mg by mouth at bedtime. ), Disp: 30 tablet, Rfl: 3 .  hydroxychloroquine (PLAQUENIL) 200 MG tablet, Take 200 mg by mouth daily. , Disp: , Rfl:  .  OVER THE COUNTER MEDICATION, Take 1 tablet by mouth daily. Bariatric Vitamin, Disp: , Rfl:  .  Oxycodone HCl  10 MG TABS, Take 10 mg by mouth 3 (three) times daily as needed., Disp: , Rfl:  .  predniSONE (DELTASONE) 10 MG tablet, , Disp: , Rfl:  .  RASUVO 10 MG/0.2ML SOAJ, Inject into the skin every 7 (seven) days. , Disp: , Rfl:   Allergies  Allergen Reactions  . Aspirin Other (See Comments)    Convulsions   . Adhesive [Tape] Itching and Rash    Objective:   LMP 02/28/2016  AAOx3, NAD Pt is able to speak clearly, coherently without shortness of breath or increased work of breathing. Thought process is linear.  Mood is appropriate.   Assessment and Plan:   Costochondritis- recurrent problem for pt.  Went to ER on 1/27 for  CP and had negative cardiac workup.  Is feeling better w/ Prednisone prescribed by ER  Carpal Tunnel- deteriorated.  R hand now has severe sxs despite wearing the brace at night.  Some improvement w/ current prednisone but pt is aware that she needs to discuss options.  Will refer to hand specialist.   [name], MD 06/10/2019

## 2019-06-21 ENCOUNTER — Ambulatory Visit
Admission: RE | Admit: 2019-06-21 | Discharge: 2019-06-21 | Disposition: A | Discharge status: Home / Self Care | Payer: 59 | Source: Ambulatory Visit | Attending: Nurse Practitioner | Admitting: Nurse Practitioner

## 2019-06-21 DIAGNOSIS — K769 Liver disease, unspecified: Secondary | ICD-10-CM

## 2019-06-21 MED ORDER — GADOBENATE DIMEGLUMINE 529 MG/ML IV SOLN
14.0000 mL | Freq: Once | INTRAVENOUS | Status: AC | PRN
  Administered 2019-06-21: 14 mL via INTRAVENOUS

## 2019-07-13 ENCOUNTER — Other Ambulatory Visit: Payer: Self-pay | Admitting: Family Medicine

## 2019-07-13 DIAGNOSIS — Z1231 Encounter for screening mammogram for malignant neoplasm of breast: Secondary | ICD-10-CM

## 2019-07-22 ENCOUNTER — Encounter: Payer: Self-pay | Admitting: Family Medicine

## 2019-07-22 MED ORDER — NYSTATIN 100000 UNIT/ML MT SUSP: 5.0000 mL | Freq: Four times a day (QID) | OROMUCOSAL | 0 refills | Status: DC

## 2019-07-22 NOTE — Telephone Encounter (Signed)
Pt called in to check the status of this please advise

## 2019-07-27 ENCOUNTER — Encounter (HOSPITAL_COMMUNITY): Payer: Self-pay

## 2019-08-30 ENCOUNTER — Ambulatory Visit: Payer: 59

## 2019-09-21 ENCOUNTER — Encounter: Payer: Self-pay | Admitting: Family Medicine

## 2019-09-21 MED ORDER — ONDANSETRON 4 MG PO TBDP: 4.0000 mg | ORAL_TABLET | Freq: Three times a day (TID) | ORAL | 0 refills | Status: DC | PRN

## 2019-10-03 ENCOUNTER — Ambulatory Visit: Payer: 59

## 2019-10-11 ENCOUNTER — Other Ambulatory Visit: Payer: Self-pay

## 2019-10-11 ENCOUNTER — Ambulatory Visit
Admission: RE | Admit: 2019-10-11 | Discharge: 2019-10-11 | Disposition: A | Discharge status: Home / Self Care | Payer: 59 | Source: Ambulatory Visit | Attending: Family Medicine | Admitting: Family Medicine

## 2019-10-11 DIAGNOSIS — Z1231 Encounter for screening mammogram for malignant neoplasm of breast: Secondary | ICD-10-CM

## 2019-10-18 ENCOUNTER — Other Ambulatory Visit: Payer: Self-pay | Admitting: General Practice

## 2019-10-18 MED ORDER — DULOXETINE HCL 60 MG PO CPEP: 60.0000 mg | ORAL_CAPSULE | Freq: Every day | ORAL | 1 refills | Status: DC

## 2019-10-31 ENCOUNTER — Encounter: Payer: Self-pay | Admitting: Family Medicine

## 2019-10-31 MED ORDER — GABAPENTIN 600 MG PO TABS: ORAL_TABLET | ORAL | 3 refills | Status: DC

## 2019-11-23 ENCOUNTER — Ambulatory Visit (INDEPENDENT_AMBULATORY_CARE_PROVIDER_SITE_OTHER): Payer: 59 | Admitting: Physician Assistant

## 2019-11-23 ENCOUNTER — Encounter: Payer: Self-pay | Admitting: Physician Assistant

## 2019-11-23 ENCOUNTER — Other Ambulatory Visit: Payer: Self-pay

## 2019-11-23 VITALS — BP 104/70 | HR 68 | Temp 98.2°F | Resp 16 | Ht 61.5 in | Wt 147.0 lb

## 2019-11-23 DIAGNOSIS — R10816 Epigastric abdominal tenderness: Secondary | ICD-10-CM

## 2019-11-23 DIAGNOSIS — R1084 Generalized abdominal pain: Secondary | ICD-10-CM

## 2019-11-23 LAB — POCT URINALYSIS DIPSTICK
Bilirubin, UA: NEGATIVE
Blood, UA: NEGATIVE
Glucose, UA: NEGATIVE
Ketones, UA: NEGATIVE
Leukocytes, UA: NEGATIVE
Nitrite, UA: NEGATIVE
Protein, UA: NEGATIVE
Spec Grav, UA: 1.01 (ref 1.010–1.025)
Urobilinogen, UA: 1 E.U./dL
pH, UA: 8 (ref 5.0–8.0)

## 2019-11-23 MED ORDER — ONDANSETRON 4 MG PO TBDP: 4.0000 mg | ORAL_TABLET | Freq: Three times a day (TID) | ORAL | 0 refills | Status: DC | PRN

## 2019-11-23 MED ORDER — FAMOTIDINE 20 MG PO TABS
20.0000 mg | ORAL_TABLET | Freq: Two times a day (BID) | ORAL | 1 refills | Status: DC
Start: 2019-11-23 — End: 2020-03-08

## 2019-11-23 NOTE — Patient Instructions (Addendum)
Please go to the lab today for blood work.  I will call you with your results. We will alter treatment regimen(s) if indicated by your results.   We will likely proceed with imaging once results are in but want to decide on modality based on labs -- Ultrasound versus CT  Avoid anti-inflammatory medications like Advil, Aleve No alcohol.  Start the famotidine twice daily as directed to help reduce acid. Follow bland diet below.  ER for any acutely worsening symptoms.  Hang in there!

## 2019-11-23 NOTE — Progress Notes (Signed)
Patient presents to clinic today c/o > 2 weeks of abdominal pain that she describes as sometimes generalized but always with bilateral upper abdominal tenderness.  Denies fever, chills, malaise. Denies anorexia, nausea, vomiting. Denies heartburn or change to bowel or bladder habits. Notes following a low-calorie diet, denying any recent changes. Denies NSAID use or alcohol consumption. Notes history of intermittent LFT elevation.   Past Medical History:  Diagnosis Date  . Anemia   . Arthritis    psoriatic arthritis  . Costochondritis   . Diverticulitis   . Dysmenorrhea   . Fibromyalgia   . GERD (gastroesophageal reflux disease)   . Headache    Migraines  . History of hiatal hernia   . Hypertension   . Menorrhagia   . Psoriatic arthritis ([place])   . Smoker     Current Outpatient Medications on File Prior to Visit  Medication Sig Dispense Refill  . BELBUCA 300 MCG FILM Take 1 Film by mouth 2 (two) times daily.     . diclofenac sodium (VOLTAREN) 1 % GEL Apply 2-4 g topically 4 (four) times daily as needed (for muscle/joint pain.).   0  . DULoxetine (CYMBALTA) 60 MG capsule Take 1 capsule (60 mg total) by mouth daily. 90 capsule 1  . folic acid (FOLVITE) 1 MG tablet Take 1 tablet by mouth daily.    Marland Kitchen gabapentin (NEURONTIN) 600 MG tablet Take 1-3 tablets by mouth at bedtime for RLS. 90 tablet 3  . nystatin (MYCOSTATIN) 100000 UNIT/ML suspension Take 5 mLs (500,000 Units total) by mouth 4 (four) times daily. Swish and spit 473 mL 0  . ondansetron (ZOFRAN ODT) 4 MG disintegrating tablet Take 1 tablet (4 mg total) by mouth every 8 (eight) hours as needed for nausea or vomiting. 20 tablet 0  . OVER THE COUNTER MEDICATION Take 1 tablet by mouth daily. Bariatric Vitamin    . Oxycodone HCl 10 MG TABS Take 10 mg by mouth 3 (three) times daily as needed.    Marland Kitchen RASUVO 10 MG/0.2ML SOAJ Inject into the skin every 7 (seven) days.      No current facility-administered medications on file prior to  visit.    Allergies  Allergen Reactions  . Aspirin Other (See Comments)    Convulsions   . Adhesive [Tape] Itching and Rash    Family History  Problem Relation Age of Onset  . Brain cancer Father   . Prostate cancer Father   . Hyperlipidemia Mother   . Colon cancer Maternal Grandmother 35  . Stroke Paternal Grandfather        COD  . Aneurysm Maternal Grandfather   . Cancer Other   . Hypertension Other   . Other Paternal Uncle        gastric polyps    Social History   Socioeconomic History  . Marital status: Married    Spouse name: Not on file  . Number of children: 0  . Years of education: Not on file  . Highest education level: Not on file  Occupational History  . Occupation: [employer], [place]  Tobacco Use  . Smoking status: Light Tobacco Smoker    Packs/day: 0.10    Years: 20.00    Pack years: 2.00    Types: Cigarettes  . Smokeless tobacco: Never Used  . Tobacco comment: stopped smoking 6 months ago   Vaping Use  . Vaping Use: Never used  Substance and Sexual Activity  . Alcohol use: Yes    Alcohol/week: 16.0  standard drinks    Types: 6 Glasses of wine, 10 Standard drinks or equivalent per week    Comment: socially, weekends 4-5  . Drug use: No  . Sexual activity: Yes    Partners: Male    Birth control/protection: None  Other Topics Concern  . Not on file  Social History Narrative  . Not on file   Social Determinants of Health   Financial Resource Strain:   . Difficulty of Paying Living Expenses:   Food Insecurity:   . Worried About Charity fundraiser in the Last Year:   . Arboriculturist in the Last Year:   Transportation Needs:   . Film/video editor (Medical):   Marland Kitchen Lack of Transportation (Non-Medical):   Physical Activity:   . Days of Exercise per Week:   . Minutes of Exercise per Session:   Stress:   . Feeling of Stress :   Social Connections:   . Frequency of Communication with Friends and Family:   . Frequency of Social  Gatherings with Friends and Family:   . Attends Religious Services:   . Active Member of Clubs or Organizations:   . Attends Archivist Meetings:   Marland Kitchen Marital Status:    Review of Systems - See HPI.  All other ROS are negative.  BP 104/70   Pulse 68   Temp 98.2 F (36.8 C) (Temporal)   Resp 16   Ht 5' 1.5" (1.562 m)   Wt 147 lb (66.7 kg)   LMP 02/28/2016   SpO2 99%   BMI 27.33 kg/m   Physical Exam Vitals reviewed.  Constitutional:      Appearance: She is well-developed.  HENT:     Head: Normocephalic and atraumatic.  Cardiovascular:     Rate and Rhythm: Normal rate and regular rhythm.     Heart sounds: Normal heart sounds.  Pulmonary:     Breath sounds: Normal breath sounds.  Abdominal:     General: Bowel sounds are normal. There is no distension.     Palpations: Abdomen is soft.     Tenderness: There is generalized abdominal tenderness and tenderness in the epigastric area.     Hernia: No hernia is present.  Neurological:     General: No focal deficit present.     Mental Status: She is alert and oriented to person, place, and time.    Assessment/Plan: 1. Generalized abdominal pain 2. Epigastric abdominal tenderness without rebound tenderness Generalized abdominal tenderness with palpation but more pronounced in LUQ, epigastrium and RUQ. No murphy sign. UA unremarkable. Will check labs to include CBC, CMP and Lipase. Will have her start Pepcid twice daily due to concern for gastritis and duodenitis. Avoiding PPI currently due to risk of interaction of Methotrexate. Will proceed with imaging based on results. Strict ER precautions reviewed with patient who voiced understanding and agreement with the plan.  - POCT urinalysis dipstick - CBC w/Diff - Lipase - Comp Met (CMET)  This visit occurred during the SARS-CoV-2 public health emergency.  Safety protocols were in place, including screening questions prior to the visit, additional usage of staff PPE, and  extensive cleaning of exam room while observing appropriate contact time as indicated for disinfecting solutions.     Leeanne Rio, PA-C

## 2019-11-24 LAB — COMPREHENSIVE METABOLIC PANEL
ALT: 27 U/L (ref 0–35)
AST: 21 U/L (ref 0–37)
Albumin: 4.2 g/dL (ref 3.5–5.2)
Alkaline Phosphatase: 83 U/L (ref 39–117)
BUN: 13 mg/dL (ref 6–23)
CO2: 29 mEq/L (ref 19–32)
Calcium: 10 mg/dL (ref 8.4–10.5)
Chloride: 103 mEq/L (ref 96–112)
Creatinine, Ser: 0.56 mg/dL (ref 0.40–1.20)
GFR: 115.01 mL/min (ref >60.00)
Glucose, Bld: 90 mg/dL (ref 70–99)
Potassium: 4.1 mEq/L (ref 3.5–5.1)
Sodium: 139 mEq/L (ref 135–145)
Total Bilirubin: 0.4 mg/dL (ref 0.2–1.2)
Total Protein: 6.8 g/dL (ref 6.0–8.3)

## 2019-11-24 LAB — CBC WITH DIFFERENTIAL/PLATELET
Basophils Absolute: 0 10*3/uL (ref 0.0–0.1)
Basophils Relative: 0.7 % (ref 0.0–3.0)
Eosinophils Absolute: 0.1 10*3/uL (ref 0.0–0.7)
Eosinophils Relative: 1.8 % (ref 0.0–5.0)
HCT: 42 % (ref 36.0–46.0)
Hemoglobin: 14.3 g/dL (ref 12.0–15.0)
Lymphocytes Relative: 36.2 % (ref 12.0–46.0)
Lymphs Abs: 2 10*3/uL (ref 0.7–4.0)
MCHC: 34 g/dL (ref 30.0–36.0)
MCV: 100.6 fl — ABNORMAL HIGH (ref 78.0–100.0)
Monocytes Absolute: 0.4 10*3/uL (ref 0.1–1.0)
Monocytes Relative: 7.9 % (ref 3.0–12.0)
Neutro Abs: 2.9 10*3/uL (ref 1.4–7.7)
Neutrophils Relative %: 53.4 % (ref 43.0–77.0)
Platelets: 214 10*3/uL (ref 150.0–400.0)
RBC: 4.18 Mil/uL (ref 3.87–5.11)
RDW: 12.4 % (ref 11.5–15.5)
WBC: 5.4 10*3/uL (ref 4.0–10.5)

## 2019-11-24 LAB — LIPASE: Lipase: 12 U/L (ref 11.0–59.0)

## 2019-11-25 ENCOUNTER — Encounter: Payer: Self-pay | Admitting: Physician Assistant

## 2019-11-25 NOTE — Telephone Encounter (Signed)
Given that she is already on hydrocodone and that we have to be really careful with oral NSAIDs due to her history of bypass and current symptoms, would recommend she add on use of the topical diclofenac -- Voltaren gel -- twice daily to the wrists.

## 2019-12-06 ENCOUNTER — Emergency Department (HOSPITAL_COMMUNITY)
Admission: EM | Admit: 2019-12-06 | Discharge: 2019-12-07 | Disposition: A | Discharge status: Home / Self Care | Payer: 59 | Attending: Emergency Medicine | Admitting: Emergency Medicine

## 2019-12-06 ENCOUNTER — Ambulatory Visit (INDEPENDENT_AMBULATORY_CARE_PROVIDER_SITE_OTHER): Payer: 59 | Admitting: Family Medicine

## 2019-12-06 ENCOUNTER — Encounter (HOSPITAL_COMMUNITY): Payer: Self-pay | Admitting: Emergency Medicine

## 2019-12-06 ENCOUNTER — Emergency Department (HOSPITAL_COMMUNITY): Payer: 59

## 2019-12-06 ENCOUNTER — Encounter: Payer: Self-pay | Admitting: Family Medicine

## 2019-12-06 ENCOUNTER — Other Ambulatory Visit: Payer: Self-pay

## 2019-12-06 VITALS — BP 122/76 | HR 76 | Temp 98.7°F | Resp 16 | Ht 62.0 in | Wt 144.4 lb

## 2019-12-06 DIAGNOSIS — I10 Essential (primary) hypertension: Secondary | ICD-10-CM | POA: Diagnosis not present

## 2019-12-06 DIAGNOSIS — F17290 Nicotine dependence, other tobacco product, uncomplicated: Secondary | ICD-10-CM | POA: Insufficient documentation

## 2019-12-06 DIAGNOSIS — R0602 Shortness of breath: Secondary | ICD-10-CM | POA: Insufficient documentation

## 2019-12-06 DIAGNOSIS — L253 Unspecified contact dermatitis due to other chemical products: Secondary | ICD-10-CM | POA: Diagnosis not present

## 2019-12-06 DIAGNOSIS — R519 Headache, unspecified: Secondary | ICD-10-CM

## 2019-12-06 DIAGNOSIS — G44209 Tension-type headache, unspecified, not intractable: Secondary | ICD-10-CM | POA: Diagnosis not present

## 2019-12-06 DIAGNOSIS — R1084 Generalized abdominal pain: Secondary | ICD-10-CM | POA: Diagnosis not present

## 2019-12-06 DIAGNOSIS — R109 Unspecified abdominal pain: Secondary | ICD-10-CM | POA: Diagnosis present

## 2019-12-06 LAB — COMPREHENSIVE METABOLIC PANEL
ALT: 37 U/L (ref 0–44)
AST: 48 U/L — ABNORMAL HIGH (ref 15–41)
Albumin: 4.3 g/dL (ref 3.5–5.0)
Alkaline Phosphatase: 92 U/L (ref 38–126)
Anion gap: 12 (ref 5–15)
BUN: 13 mg/dL (ref 6–20)
CO2: 18 mmol/L — ABNORMAL LOW (ref 22–32)
Calcium: 10 mg/dL (ref 8.9–10.3)
Chloride: 106 mmol/L (ref 98–111)
Creatinine, Ser: 0.5 mg/dL (ref 0.44–1.00)
GFR calc Af Amer: >60 mL/min (ref >60)
GFR calc non Af Amer: >60 mL/min (ref >60)
Glucose, Bld: 135 mg/dL — ABNORMAL HIGH (ref 70–99)
Potassium: 4 mmol/L (ref 3.5–5.1)
Sodium: 136 mmol/L (ref 135–145)
Total Bilirubin: 0.3 mg/dL (ref 0.3–1.2)
Total Protein: 8 g/dL (ref 6.5–8.1)

## 2019-12-06 LAB — CBC WITH DIFFERENTIAL/PLATELET
Abs Immature Granulocytes: 0.02 10*3/uL (ref 0.00–0.07)
Basophils Absolute: 0 10*3/uL (ref 0.0–0.1)
Basophils Relative: 0 %
Eosinophils Absolute: 0 10*3/uL (ref 0.0–0.5)
Eosinophils Relative: 0 %
HCT: 44.3 % (ref 36.0–46.0)
Hemoglobin: 15.1 g/dL — ABNORMAL HIGH (ref 12.0–15.0)
Immature Granulocytes: 0 %
Lymphocytes Relative: 15 %
Lymphs Abs: 1.1 10*3/uL (ref 0.7–4.0)
MCH: 33.9 pg (ref 26.0–34.0)
MCHC: 34.1 g/dL (ref 30.0–36.0)
MCV: 99.3 fL (ref 80.0–100.0)
Monocytes Absolute: 0.1 10*3/uL (ref 0.1–1.0)
Monocytes Relative: 1 %
Neutro Abs: 6.2 10*3/uL (ref 1.7–7.7)
Neutrophils Relative %: 84 %
Platelets: 252 10*3/uL (ref 150–400)
RBC: 4.46 MIL/uL (ref 3.87–5.11)
RDW: 12.2 % (ref 11.5–15.5)
WBC: 7.4 10*3/uL (ref 4.0–10.5)
nRBC: 0 % (ref 0.0–0.2)

## 2019-12-06 MED ORDER — IOHEXOL 300 MG/ML  SOLN
100.0000 mL | Freq: Once | INTRAMUSCULAR | Status: AC | PRN
  Administered 2019-12-07: 75 mL via INTRAVENOUS

## 2019-12-06 MED ORDER — TRIAMCINOLONE ACETONIDE 0.1 % EX OINT
1.0000 | TOPICAL_OINTMENT | Freq: Two times a day (BID) | CUTANEOUS | 1 refills | Status: DC
Start: 2019-12-06 — End: 2020-07-09

## 2019-12-06 MED ORDER — FENTANYL CITRATE (PF) 100 MCG/2ML IJ SOLN
50.0000 ug | Freq: Once | INTRAMUSCULAR | Status: AC
  Administered 2019-12-07: 50 ug via INTRAVENOUS
  Filled 2019-12-06: qty 2

## 2019-12-06 MED ORDER — ONDANSETRON HCL 4 MG/2ML IJ SOLN
4.0000 mg | Freq: Once | INTRAMUSCULAR | Status: AC
  Administered 2019-12-07: 4 mg via INTRAVENOUS
  Filled 2019-12-06: qty 2

## 2019-12-06 MED ORDER — PREDNISONE 10 MG PO TABS
ORAL_TABLET | ORAL | 0 refills | Status: DC
Start: 2019-12-06 — End: 2020-01-26

## 2019-12-06 NOTE — ED Triage Notes (Signed)
Pt c/o sob x one hour and right sided abd pain that radiates to the back x 2 hours.

## 2019-12-06 NOTE — Progress Notes (Signed)
   Subjective:    Patient ID: Dawn Hayes, female    DOB: [birth year], 49 y.o.   MRN: [id number]  HPI Rash- R forearm.  Pt had carpal tunnel surgery on Thursday 7/22.  Removed wrap on Sunday and area was red and swollen.  Area is very itchy.  She feels it is a rxn to the skin prep/cleanser.  She has called her surgeon to determine what was used.  No similar areas elsewhere   Review of Systems For ROS see HPI  This visit occurred during the SARS-CoV-2 public health emergency.  Safety protocols were in place, including screening questions prior to the visit, additional usage of staff PPE, and extensive cleaning of exam room while observing appropriate contact time as indicated for disinfecting solutions.       Objective:   Physical Exam Vitals reviewed.  Constitutional:      General: She is not in acute distress.    Appearance: Normal appearance. She is not ill-appearing.  HENT:     Head: Normocephalic and atraumatic.  Skin:    General: Skin is warm and dry.     Findings: Erythema and rash (fine maculopapular rash of R forearm w/ pustular psoriasis of palms bilaterally) present.  Neurological:     Mental Status: She is alert.  Psychiatric:        Mood and Affect: Mood normal.        Behavior: Behavior normal.        Thought Content: Thought content normal.           Assessment & Plan:  Contact dermatitis- likely due to the skin prep prior to her surgery.  Start Prednisone taper and topical Triamcinolone for relief.  Pt expressed understanding and is in agreement w/ plan.

## 2019-12-06 NOTE — Patient Instructions (Signed)
Follow up as needed or as scheduled START the Prednisone taper today w/ lunch- 3 pills at the same time x3 days, then 2 pills at the same time x3 days, and then 1 pill daily Apply the Triamcinolone ointment twice daily to the itchy areas Call with any questions or concerns Hang in there!

## 2019-12-07 LAB — URINALYSIS, ROUTINE W REFLEX MICROSCOPIC
Bilirubin Urine: NEGATIVE
Glucose, UA: NEGATIVE mg/dL
Hgb urine dipstick: NEGATIVE
Ketones, ur: NEGATIVE mg/dL
Leukocytes,Ua: NEGATIVE
Nitrite: NEGATIVE
Protein, ur: NEGATIVE mg/dL
Specific Gravity, Urine: 1.008 (ref 1.005–1.030)
pH: 6 (ref 5.0–8.0)

## 2019-12-07 LAB — D-DIMER, QUANTITATIVE: D-Dimer, Quant: 0.27 ug/mL-FEU (ref 0.00–0.50)

## 2019-12-07 MED ORDER — DIPHENHYDRAMINE HCL 50 MG/ML IJ SOLN
25.0000 mg | Freq: Once | INTRAMUSCULAR | Status: AC
  Administered 2019-12-07: 25 mg via INTRAVENOUS
  Filled 2019-12-07: qty 1

## 2019-12-07 MED ORDER — ONDANSETRON HCL 4 MG PO TABS
4.0000 mg | ORAL_TABLET | Freq: Three times a day (TID) | ORAL | 0 refills | Status: DC | PRN
Start: 2019-12-07 — End: 2020-01-26

## 2019-12-07 MED ORDER — METOCLOPRAMIDE HCL 5 MG/ML IJ SOLN
10.0000 mg | Freq: Once | INTRAMUSCULAR | Status: AC
  Administered 2019-12-07: 10 mg via INTRAVENOUS
  Filled 2019-12-07: qty 2

## 2019-12-07 MED ORDER — DEXAMETHASONE SODIUM PHOSPHATE 10 MG/ML IJ SOLN
10.0000 mg | Freq: Once | INTRAMUSCULAR | Status: AC
  Administered 2019-12-07: 10 mg via INTRAVENOUS
  Filled 2019-12-07: qty 1

## 2019-12-07 NOTE — ED Provider Notes (Signed)
[facility] EMERGENCY DEPARTMENT Provider Note   CSN: [id number] Arrival date & time: 12/06/19  1853   Time seen 11:30 PM History Chief Complaint  Patient presents with  . Shortness of Breath    Dawn Hayes is a 49 y.o. female.  HPI   Patient states today about 4 PM she started having pain in her right upper quadrant that radiates into her right flank.  She is unsure if she has ever had it before.  She states the pain is been there constantly and is sharp.  She denies any association with food.  She has had nausea and vomiting twice.  She denies diarrhea, dysuria, frequency or hematuria today.  She states a few days ago she did see some blood when she wiped.  She denies any fever.  She states the pain makes it difficult to sit still.  She states there is a family history of kidney stones.  She denies significant caffeine or milk ingestion.  She states she felt short of breath about the same time the pain started but denies a pleuritic component to the pain and denies coughing.  Patient had carpal tunnel surgery done on July 22 on her right arm.  She states she change the dressing on the 25th and since then she has had a rash just proximal to the dressing.  She was seen by her primary care doctor today and started on some steroid ointment.  Patient also states she was seen by a specialist for liver lesions.  She is supposed to have a 54-month and a 12-month appointment in follow-up.  She is status post cholecystectomy and states this pain feels different.  PCP Midge Minium, MD     Past Medical History:  Diagnosis Date  . Anemia   . Arthritis    psoriatic arthritis  . Costochondritis   . Diverticulitis   . Dysmenorrhea   . Fibromyalgia   . GERD (gastroesophageal reflux disease)   . Headache    Migraines  . History of hiatal hernia   . Hypertension   . Menorrhagia   . Psoriatic arthritis ([place])   . Smoker     Patient Active Problem List   Diagnosis Date Noted  .  Carpal tunnel syndrome, right 06/10/2019  . Nausea 01/27/2018  . Bariatric surgery status 01/26/2018  . RLS (restless legs syndrome) 01/26/2018  . Vitamin D deficiency 11/10/2017  . Fibromyalgia 11/10/2017  . Anxiety and depression 05/27/2016  . Status post total abdominal hysterectomy 04/08/2016  . Costochondritis 12/04/2015  . Anemia, iron deficiency 10/10/2015  . Tick bites 10/10/2015  . Fatigue 10/10/2015  . Allergic rhinitis 09/07/2015  . Hemorrhoid   . Diverticulosis of colon without hemorrhage   . Hiatal hernia   . Rectal bleeding 05/08/2015  . Abnormal finding on EKG 09/16/2013  . Psoriatic arthritis ([place]) 09/16/2013  . GERD 12/11/2009  . DIARRHEA, CHRONIC 12/11/2009    Past Surgical History:  Procedure Laterality Date  . ABDOMINAL HYSTERECTOMY    . CARPAL TUNNEL RELEASE Left 01/19/2015   Procedure: LEFT CARPAL TUNNEL RELEASE;  Surgeon: Roseanne Kaufman, MD;  Location: [place];  Service: Orthopedics;  Laterality: Left;  . CHOLECYSTECTOMY    . CHOLECYSTECTOMY, LAPAROSCOPIC  2006  . COLONOSCOPY  1998   normal, [place]  . COLONOSCOPY N/A 05/30/2015   RMR: Minimal anal canal hemorrhoids. colonic diverticulosis. I suspect relatively trivial recent gI Bleed likely related to hemorrhoids. This finding alone would not likely adequatly explain her degree  of anemia. she likely has a significent component from menstrual losses. , etc.   . COLONOSCOPY WITH PROPOFOL N/A 03/09/2017   Dr. Gala Romney: scattered diverticula, distal 5cm of TI normal. Grade II non-bleeding internal hemorrhoids. next TCS 10 years.   . CYSTOSCOPY N/A 04/08/2016   Procedure: CYSTOSCOPY;  Surgeon: Nunzio Cobbs, MD;  Location: [facility];  Service: Gynecology;  Laterality: N/A;  . DILATATION & CURETTAGE/HYSTEROSCOPY WITH MYOSURE N/A 01/04/2016   Procedure: DILATATION & CURETTAGE/HYSTEROSCOPY;  Surgeon: Nunzio Cobbs, MD;  Location: [facility];  Service: Gynecology;  Laterality: N/A;    . ESOPHAGOGASTRODUODENOSCOPY  09/11/99   tiny esophageal erosions with mild erosive reflux/no barrett's/normal stomach  . ESOPHAGOGASTRODUODENOSCOPY N/A 05/30/2015   RMR: Hiatal hernia otherwise negative EGD  . GASTRIC ROUX-EN-Y N/A 01/19/2018   Procedure: LAPAROSCOPIC ROUX-EN-Y GASTRIC BYPASS WITH HIATAL HERNIA REPAIR, WITH UPPER ENDOSCOPY, ERAS Pathway;  Surgeon: Greer Pickerel, MD;  Location: [facility];  Service: General;  Laterality: N/A;  . GIVENS CAPSULE STUDY N/A 08/14/2017   Procedure: GIVENS CAPSULE STUDY;  Surgeon: Daneil Dolin, MD;  [facility];  Service: Endoscopy;  Laterality: N/A;  7:30am  . HYSTERECTOMY ABDOMINAL WITH SALPINGECTOMY Bilateral 04/08/2016   Procedure: HYSTERECTOMY ABDOMINAL WITH SALPINGECTOMY;  Surgeon: Nunzio Cobbs, MD;  Location: [facility];  Service: Gynecology;  Laterality: Bilateral;  . LAPAROSCOPIC BILATERAL SALPINGECTOMY N/A 04/08/2016   Procedure: LAPAROSCOPIC BILATERAL SALPINGECTOMY possible BSO;  Surgeon: Nunzio Cobbs, MD;  Location: [facility];  Service: Gynecology;  Laterality: N/A;  . LAPAROSCOPIC HYSTERECTOMY N/A 04/08/2016   Procedure: ATTEMPTED HYSTERECTOMY TOTAL LAPAROSCOPIC;  Surgeon: Nunzio Cobbs, MD;  Location: [facility];  Service: Gynecology;  Laterality: N/A;  . LAPAROSCOPIC LYSIS OF ADHESIONS  04/08/2016   Procedure: LAPAROSCOPIC LYSIS OF ADHESIONS;  Surgeon: Nunzio Cobbs, MD;  Location: [facility];  Service: Gynecology;;  . LAPAROSCOPIC LYSIS OF ADHESIONS N/A 01/19/2018   Procedure: LAPAROSCOPIC LYSIS OF ADHESIONS;  Surgeon: Greer Pickerel, MD;  Location: [facility];  Service: General;  Laterality: N/A;     OB History    Gravida  0   Para      Term      Preterm      AB      Living        SAB      TAB      Ectopic      Multiple      Live Births              Family History  Problem Relation Age of Onset  . Brain cancer Father   . Prostate cancer Father   . Hyperlipidemia Mother   .  Colon cancer Maternal Grandmother 5  . Stroke Paternal Grandfather        COD  . Aneurysm Maternal Grandfather   . Cancer Other   . Hypertension Other   . Other Paternal Uncle        gastric polyps    Social History   Tobacco Use  . Smoking status: Light Tobacco Smoker    Packs/day: 0.10    Years: 20.00    Pack years: 2.00    Types: Cigarettes  . Smokeless tobacco: Never Used  . Tobacco comment: stopped smoking 6 months ago   Vaping Use  . Vaping Use: Never used  Substance Use Topics  . Alcohol use: Yes    Alcohol/week: 16.0 standard drinks  Types: 6 Glasses of wine, 10 Standard drinks or equivalent per week    Comment: socially, weekends 4-5  . Drug use: No    Home Medications Prior to Admission medications   Medication Sig Start Date End Date Taking? Authorizing Provider  BELBUCA 300 MCG FILM Take 1 Film by mouth 2 (two) times daily.  12/06/18   [provider]  diclofenac sodium (VOLTAREN) 1 % GEL Apply 2-4 g topically 4 (four) times daily as needed (for muscle/joint pain.).  09/25/17   [provider]  DULoxetine (CYMBALTA) 60 MG capsule Take 1 capsule (60 mg total) by mouth daily. 10/18/19   Midge Minium, MD  famotidine (PEPCID) 20 MG tablet Take 1 tablet (20 mg total) by mouth 2 (two) times daily. 11/23/19   Brunetta Jeans, PA-C  folic acid (FOLVITE) 1 MG tablet Take 1 tablet by mouth daily. 07/07/18   [provider]  gabapentin (NEURONTIN) 600 MG tablet Take 1-3 tablets by mouth at bedtime for RLS. 10/31/19   Midge Minium, MD  nystatin (MYCOSTATIN) 100000 UNIT/ML suspension Take 5 mLs (500,000 Units total) by mouth 4 (four) times daily. Swish and spit 07/22/19   Midge Minium, MD  ondansetron (ZOFRAN ODT) 4 MG disintegrating tablet Take 1 tablet (4 mg total) by mouth every 8 (eight) hours as needed for nausea or vomiting. 11/23/19   Brunetta Jeans, PA-C  ondansetron (ZOFRAN) 4 MG tablet Take 1 tablet (4 mg total) by mouth  every 8 (eight) hours as needed for nausea or vomiting. 12/07/19   Rolland Porter, MD  OVER THE COUNTER MEDICATION Take 1 tablet by mouth daily. Bariatric Vitamin    [provider]  Oxycodone HCl 10 MG TABS Take 10 mg by mouth 3 (three) times daily as needed. 05/19/19   [provider]  predniSONE (DELTASONE) 10 MG tablet 3 tabs x3 days and then 2 tabs x3 days and then 1 tab x3 days.  Take w/ food. 12/06/19   Midge Minium, MD  RASUVO 10 MG/0.2ML SOAJ Inject into the skin every 7 (seven) days.  09/14/18   [provider]  triamcinolone ointment (KENALOG) 0.1 % Apply 1 application topically 2 (two) times daily. 12/06/19 12/05/20  Midge Minium, MD    Allergies    Aspirin and Adhesive [tape]  Review of Systems   Review of Systems  All other systems reviewed and are negative.   Physical Exam Updated Vital Signs BP (!) 135/96 (BP Location: Left Arm)   Pulse (!) 107   Temp 98.4 F (36.9 C) (Oral)   Resp 20   Ht 5\' 1"  (1.549 m)   Wt 65.8 kg   LMP 02/28/2016   SpO2 99%   BMI 27.40 kg/m   Physical Exam Vitals and nursing note reviewed.  Constitutional:      General: She is in acute distress.     Appearance: Normal appearance. She is normal weight.     Comments: Patient has difficulty sitting still, she is constantly changing positions and holding her right upper quadrant.  HENT:     Head: Normocephalic and atraumatic.     Right Ear: External ear normal.     Left Ear: External ear normal.     Nose: Nose normal.     Mouth/Throat:     Mouth: Mucous membranes are moist.     Pharynx: No oropharyngeal exudate or posterior oropharyngeal erythema.  Eyes:     Extraocular Movements: Extraocular movements intact.  Conjunctiva/sclera: Conjunctivae normal.     Pupils: Pupils are equal, round, and reactive to light.  Cardiovascular:     Rate and Rhythm: Normal rate and regular rhythm.     Pulses: Normal pulses.  Pulmonary:     Effort: Pulmonary effort is  normal. No respiratory distress.     Breath sounds: Normal breath sounds.  Abdominal:     General: Bowel sounds are normal.     Palpations: There is no mass.     Tenderness: There is abdominal tenderness. There is right CVA tenderness. There is no left CVA tenderness.    Musculoskeletal:        General: Normal range of motion.     Cervical back: Normal range of motion.  Skin:    General: Skin is warm and dry.  Neurological:     General: No focal deficit present.     Mental Status: She is alert and oriented to person, place, and time.     Cranial Nerves: No cranial nerve deficit.  Psychiatric:        Mood and Affect: Mood is anxious.        Speech: Speech is rapid and pressured.        Behavior: Behavior is cooperative.     ED Results / Procedures / Treatments   Labs (all labs ordered are listed, but only abnormal results are displayed) Results for orders placed or performed during the hospital encounter of 12/06/19  CBC with Differential  Result Value Ref Range   WBC 7.4 4.0 - 10.5 K/uL   RBC 4.46 3.87 - 5.11 MIL/uL   Hemoglobin 15.1 (H) 12.0 - 15.0 g/dL   HCT 44.3 36 - 46 %   MCV 99.3 80.0 - 100.0 fL   MCH 33.9 26.0 - 34.0 pg   MCHC 34.1 30.0 - 36.0 g/dL   RDW 12.2 11.5 - 15.5 %   Platelets 252 150 - 400 K/uL   nRBC 0.0 0.0 - 0.2 %   Neutrophils Relative % 84 %   Neutro Abs 6.2 1.7 - 7.7 K/uL   Lymphocytes Relative 15 %   Lymphs Abs 1.1 0.7 - 4.0 K/uL   Monocytes Relative 1 %   Monocytes Absolute 0.1 0 - 1 K/uL   Eosinophils Relative 0 %   Eosinophils Absolute 0.0 0 - 0 K/uL   Basophils Relative 0 %   Basophils Absolute 0.0 0 - 0 K/uL   Immature Granulocytes 0 %   Abs Immature Granulocytes 0.02 0.00 - 0.07 K/uL  Comprehensive metabolic panel  Result Value Ref Range   Sodium 136 135 - 145 mmol/L   Potassium 4.0 3.5 - 5.1 mmol/L   Chloride 106 98 - 111 mmol/L   CO2 18 (L) 22 - 32 mmol/L   Glucose, Bld 135 (H) 70 - 99 mg/dL   BUN 13 6 - 20 mg/dL   Creatinine,  Ser 0.50 0.44 - 1.00 mg/dL   Calcium 10.0 8.9 - 10.3 mg/dL   Total Protein 8.0 6.5 - 8.1 g/dL   Albumin 4.3 3.5 - 5.0 g/dL   AST 48 (H) 15 - 41 U/L   ALT 37 0 - 44 U/L   Alkaline Phosphatase 92 38 - 126 U/L   Total Bilirubin 0.3 0.3 - 1.2 mg/dL   GFR calc non Af Amer >60 >60 mL/min   GFR calc Af Amer >60 >60 mL/min   Anion gap 12 5 - 15  Urinalysis, Routine w reflex microscopic  Result Value Ref Range  Color, Urine YELLOW YELLOW   APPearance CLEAR CLEAR   Specific Gravity, Urine 1.008 1.005 - 1.030   pH 6.0 5.0 - 8.0   Glucose, UA NEGATIVE NEGATIVE mg/dL   Hgb urine dipstick NEGATIVE NEGATIVE   Bilirubin Urine NEGATIVE NEGATIVE   Ketones, ur NEGATIVE NEGATIVE mg/dL   Protein, ur NEGATIVE NEGATIVE mg/dL   Nitrite NEGATIVE NEGATIVE   Leukocytes,Ua NEGATIVE NEGATIVE  D-dimer, quantitative (not at [facility])  Result Value Ref Range   D-Dimer, Quant 0.27 0.00 - 0.50 ug/mL-FEU   Laboratory interpretation all normal except minor elevation of 1 her of her LFTs which she has had in the past    EKG EKG Interpretation  Date/Time:  Tuesday December 06 2019 19:06:11 EDT Ventricular Rate:  107 PR Interval:  136 QRS Duration: 78 QT Interval:  346 QTC Calculation: 461 R Axis:   109 Text Interpretation: Sinus tachycardia with Premature atrial complexes with Abberant conduction Rightward axis Low voltage QRS Cannot rule out Anterior infarct , age undetermined Since last tracing rate faster 08 Jun 2019 Confirmed by Rolland Porter [phone]) on 12/06/2019 11:38:19 PM   Radiology DG Chest 2 View  Result Date: 12/06/2019 CLINICAL DATA:  49 year old female with shortness of breath EXAM: CHEST - 2 VIEW COMPARISON:  Chest radiograph dated 06/08/2019. FINDINGS: The heart size and mediastinal contours are within normal limits. Both lungs are clear. The visualized skeletal structures are unremarkable. IMPRESSION: No active cardiopulmonary disease. Electronically Signed   By: Anner Crete M.D.   On:  12/06/2019 19:37   CT Abdomen Pelvis W Contrast  Result Date: 12/07/2019 CLINICAL DATA:  Right-sided abdominal pain for 1 hour EXAM: CT ABDOMEN AND PELVIS WITH CONTRAST TECHNIQUE: Multidetector CT imaging of the abdomen and pelvis was performed using the standard protocol following bolus administration of intravenous contrast. CONTRAST:  67mL OMNIPAQUE IOHEXOL 300 MG/ML  SOLN COMPARISON:  None. FINDINGS: Lower chest: No acute abnormality. Hepatobiliary: No focal liver abnormality is seen. Status post cholecystectomy. No biliary dilatation. Pancreas: Unremarkable. No pancreatic ductal dilatation or surrounding inflammatory changes. Spleen: Normal in size without focal abnormality. Adrenals/Urinary Tract: Adrenal glands are within normal limits. Kidneys are well visualized bilaterally. No renal calculi or obstructive changes are seen. Normal excretion of contrast is noted. The bladder is decompressed. Stomach/Bowel: The appendix is within normal limits. No obstructive or inflammatory changes of the colon are seen. Scattered diverticular changes noted. Changes of prior gastric bypass are noted. No obstructive changes of the small bowel are seen. Vascular/Lymphatic: Aortic atherosclerosis. No enlarged abdominal or pelvic lymph nodes. Reproductive: Status post hysterectomy. No adnexal masses. Other: No abdominal wall hernia or abnormality. No abdominopelvic ascites. Musculoskeletal: No acute or significant osseous findings. IMPRESSION: Diverticulosis without diverticulitis. Changes of prior gastric bypass. Changes of prior cholecystectomy. No acute abnormality noted. Electronically Signed   By: Inez Catalina M.D.   On: 12/07/2019 01:33    Procedures Procedures (including critical care time)  Medications Ordered in ED Medications  fentaNYL (SUBLIMAZE) injection 50 mcg (50 mcg Intravenous Given 12/07/19 0104)  ondansetron (ZOFRAN) injection 4 mg (4 mg Intravenous Given 12/07/19 0103)  iohexol (OMNIPAQUE) 300  MG/ML solution 100 mL (75 mLs Intravenous Contrast Given 12/07/19 0108)  metoCLOPramide (REGLAN) injection 10 mg (10 mg Intravenous Given 12/07/19 0241)  diphenhydrAMINE (BENADRYL) injection 25 mg (25 mg Intravenous Given 12/07/19 0241)  dexamethasone (DECADRON) injection 10 mg (10 mg Intravenous Given 12/07/19 0241)    ED Course  I have reviewed the triage vital signs and the nursing notes.  Pertinent labs & imaging results that were available during my care of the patient were reviewed by me and considered in my medical decision making (see chart for details).    MDM Rules/Calculators/A&P                           Review of prior imaging studies show she had a MRI done June 21, 2019 which showed stable liver lesions compared to previous MRI of March 09, 2019 they were felt to be typical of hemangiomas.  There were no new enlarging or suspicious hepatic findings.  Patient had CT of the abdomen/pelvis done March 03, 2018 which showed no renal or ureteral stones.  After reviewing her prior imaging studies I was concerned that possibly one of her hemangiomas had bled.  CT abdomen/pelvis with contrast was done.   When I reviewed the results of her CT scan I added a D-dimer to make sure I was not missing a pulmonary embolus.  Recheck the patient at 2:30 AM and relayed that her CT did not show any acute findings.  At this point patient is rolling around on the stretcher and holding her head.  When I asked her what is going on she states she started having a headache in triage which she had not told me about before.  She states she does not have a history of headaches.  She was given a migraine cocktail.  Review of the [place] shows patient gets #90 oxycodone 10 mg monthly, last filled July 8, and #60 Belbuca 300 mcg films monthly last filled July 11.  These are prescribed by the same prescriber.  Recheck at 4:30 AM patient's laying quietly on her side on the stretcher sleeping.   I have looked at her CT scan and she does appear to have a lot of stool scattered throughout her colon.  She is on a lot of narcotics.  Her headache is much improved after the migraine cocktail.  At this point I feel like she can be discharged.  Final Clinical Impression(s) / ED Diagnoses Final diagnoses:  Generalized abdominal pain  Acute nonintractable headache, unspecified headache type    Rx / DC Orders ED Discharge Orders         Ordered    ondansetron (ZOFRAN) 4 MG tablet  Every 8 hours PRN     Discontinue  Reprint     12/07/19 0444        OTC miralax  Plan discharge  Rolland Porter, MD, Barbette Or, MD 12/07/19 [phone]

## 2019-12-07 NOTE — Discharge Instructions (Addendum)
Get miralax and put one dose or 17 g in 8 ounces of water,  take 1 dose every 30 minutes for 2-3 hours or until you  get good results and then once or twice daily to prevent constipation.  Use the Zofran for nausea if needed.  Recheck if you get worse again.

## 2019-12-09 ENCOUNTER — Encounter: Payer: Self-pay | Admitting: Family Medicine

## 2019-12-09 ENCOUNTER — Encounter: Payer: Self-pay | Admitting: General Practice

## 2020-01-04 ENCOUNTER — Encounter: Payer: Self-pay | Admitting: Family Medicine

## 2020-01-06 ENCOUNTER — Encounter: Payer: Self-pay | Admitting: Family Medicine

## 2020-01-26 ENCOUNTER — Ambulatory Visit (INDEPENDENT_AMBULATORY_CARE_PROVIDER_SITE_OTHER): Payer: 59 | Admitting: Family Medicine

## 2020-01-26 ENCOUNTER — Other Ambulatory Visit: Payer: Self-pay

## 2020-01-26 ENCOUNTER — Encounter: Payer: Self-pay | Admitting: Family Medicine

## 2020-01-26 ENCOUNTER — Ambulatory Visit: Payer: 59 | Admitting: Physician Assistant

## 2020-01-26 VITALS — BP 118/80 | HR 78 | Temp 98.3°F | Resp 16 | Ht 62.0 in | Wt 151.0 lb

## 2020-01-26 DIAGNOSIS — Z23 Encounter for immunization: Secondary | ICD-10-CM | POA: Diagnosis not present

## 2020-01-26 DIAGNOSIS — R41840 Attention and concentration deficit: Secondary | ICD-10-CM | POA: Diagnosis not present

## 2020-01-26 NOTE — Progress Notes (Signed)
   Subjective:    Patient ID: Dawn Hayes, female    DOB: [birth year], 49 y.o.   MRN: [id number]  HPI Memory issues- pt reports sxs for 'the last few months'.  Reports trouble staying on tasks, trouble w/ focus, making mistakes that aren't typical for her, difficulty finishing things she starts.  Also having some word finding difficulty.  Is misplacing things.  Husband has noticed a change.  No recent med changes.  Denies relation to fatigue or stress.  Feels she may has had issues w/ attention and task completion previously but it's worse than previous.  Will 'zone out' on people when she is attempting to have conversations.   Review of Systems For ROS see HPI   This visit occurred during the SARS-CoV-2 public health emergency.  Safety protocols were in place, including screening questions prior to the visit, additional usage of staff PPE, and extensive cleaning of exam room while observing appropriate contact time as indicated for disinfecting solutions.       Objective:   Physical Exam Vitals reviewed.  Constitutional:      General: She is not in acute distress.    Appearance: Normal appearance. She is not ill-appearing.  HENT:     Head: Normocephalic and atraumatic.  Eyes:     Extraocular Movements: Extraocular movements intact.     Pupils: Pupils are equal, round, and reactive to light.  Skin:    General: Skin is warm and dry.  Neurological:     General: No focal deficit present.     Mental Status: She is alert and oriented to person, place, and time.     Cranial Nerves: No cranial nerve deficit.     Motor: No weakness.     Gait: Gait normal.  Psychiatric:        Mood and Affect: Mood normal.        Behavior: Behavior normal.        Thought Content: Thought content normal.           Assessment & Plan:  Poor concentration- new.  Pt reports she has always had some issues w/ concentration and task completion but it has worsened recently and those around her have  noticed.  Unclear if she has undiagnosed inattentive ADHD or some other issue at play.  Encouraged her to call and schedule a complete assessment to determine the next best steps.  Pt expressed understanding and is in agreement w/ plan.

## 2020-01-26 NOTE — Patient Instructions (Signed)
Follow up as needed or as scheduled Please call one of the following to schedule an assessment for memory/ADHD issues  - [facility] [phone]  - [facility] [phone] Once you have your assessment, it will let us know which direction to go to best help your situation Call with any questions or concerns Stay Safe!  Stay Healthy!

## 2020-02-01 ENCOUNTER — Ambulatory Visit: Payer: 59 | Admitting: Family Medicine

## 2020-02-06 ENCOUNTER — Ambulatory Visit: Payer: 59 | Admitting: Family Medicine

## 2020-02-06 ENCOUNTER — Encounter: Payer: Self-pay | Admitting: Family Medicine

## 2020-02-06 ENCOUNTER — Other Ambulatory Visit: Payer: Self-pay

## 2020-02-06 VITALS — BP 110/76 | HR 76 | Temp 98.0°F | Resp 16 | Wt 148.5 lb

## 2020-02-06 DIAGNOSIS — H6983 Other specified disorders of Eustachian tube, bilateral: Secondary | ICD-10-CM | POA: Diagnosis not present

## 2020-02-06 DIAGNOSIS — H6121 Impacted cerumen, right ear: Secondary | ICD-10-CM

## 2020-02-06 NOTE — Patient Instructions (Signed)
Follow up as needed or as scheduled START Flonase- 2 sprays each nostril daily- to help unclog those eustachian tubes Call with any questions or concerns Have a great trip!!

## 2020-02-06 NOTE — Progress Notes (Signed)
   Subjective:    Patient ID: Dawn Hayes, female    DOB: 13-Jun-1970, 49 y.o.   MRN: [id number]  HPI Ear fullness- sxs started on R side but now both sides are 'stopped up'.  She is supposed to fly on Friday.  No drainage.  No fevers.  No pain w/ manipulation of outer ears.  Denies pain.  Can feel ears popping when she swallows.  R ear started ~2 weeks ago.  Not currently taking allergy medication.   Review of Systems For ROS see HPI   This visit occurred during the SARS-CoV-2 public health emergency.  Safety protocols were in place, including screening questions prior to the visit, additional usage of staff PPE, and extensive cleaning of exam room while observing appropriate contact time as indicated for disinfecting solutions.       Objective:   Physical Exam Vitals reviewed.  Constitutional:      General: She is not in acute distress.    Appearance: Normal appearance. She is not ill-appearing.  HENT:     Head: Normocephalic and atraumatic.     Right Ear: External ear normal. There is impacted cerumen.     Left Ear: External ear normal. There is no impacted cerumen.     Ears:     Comments: Cerumen successfully removed w/ curette in R ear after pt permission obtained.  Pt tolerated without difficulty and noticed immediate improvement in R ear fullness  TMs mildly retracted bilaterally Eyes:     Extraocular Movements: Extraocular movements intact.     Pupils: Pupils are equal, round, and reactive to light.  Skin:    General: Skin is warm and dry.  Neurological:     General: No focal deficit present.     Mental Status: She is alert and oriented to person, place, and time.  Psychiatric:        Mood and Affect: Mood normal.        Behavior: Behavior normal.        Thought Content: Thought content normal.           Assessment & Plan:  Cerumen impaction- new.  R ear.  Easily removed w/ curette.  Pt tolerated w/o difficulty.  Noted immediate improvement.  Eustachian  tube dysfxn- new.  Reviewed dx and tx w/ pt.  Start Flonase daily.  Pt expressed understanding and is in agreement w/ plan.

## 2020-02-22 ENCOUNTER — Other Ambulatory Visit: Payer: Self-pay | Admitting: General Practice

## 2020-02-22 ENCOUNTER — Encounter: Payer: Self-pay | Admitting: Physician Assistant

## 2020-02-22 MED ORDER — ONDANSETRON HCL 4 MG PO TABS: 4.0000 mg | ORAL_TABLET | Freq: Three times a day (TID) | ORAL | 2 refills | Status: DC | PRN

## 2020-03-08 ENCOUNTER — Ambulatory Visit (INDEPENDENT_AMBULATORY_CARE_PROVIDER_SITE_OTHER): Payer: 59 | Admitting: Family Medicine

## 2020-03-08 ENCOUNTER — Other Ambulatory Visit: Payer: Self-pay

## 2020-03-08 ENCOUNTER — Encounter: Payer: Self-pay | Admitting: Family Medicine

## 2020-03-08 VITALS — BP 118/78 | HR 61 | Temp 97.7°F | Wt 147.6 lb

## 2020-03-08 DIAGNOSIS — R1013 Epigastric pain: Secondary | ICD-10-CM

## 2020-03-08 LAB — POCT URINALYSIS DIPSTICK
Bilirubin, UA: NEGATIVE
Blood, UA: NEGATIVE
Glucose, UA: NEGATIVE
Ketones, UA: NEGATIVE
Leukocytes, UA: NEGATIVE
Nitrite, UA: NEGATIVE
Protein, UA: NEGATIVE
Spec Grav, UA: 1.025 (ref 1.010–1.025)
Urobilinogen, UA: 0.2 E.U./dL
pH, UA: 6 (ref 5.0–8.0)

## 2020-03-08 MED ORDER — FAMOTIDINE 40 MG PO TABS
40.0000 mg | ORAL_TABLET | Freq: Every day | ORAL | 3 refills | Status: DC
Start: 2020-03-08 — End: 2020-07-09

## 2020-03-08 MED ORDER — SUCRALFATE 1 G PO TABS: 1.0000 g | ORAL_TABLET | Freq: Three times a day (TID) | ORAL | 0 refills | Status: DC

## 2020-03-08 NOTE — Patient Instructions (Signed)
Follow up as needed or as scheduled We'll notify you of your lab results and make any changes if needed START the Famotidine 40mg  nightly to decrease acid production ADD the Carafate prior to each meal and before bed Try and avoid your stomach being empty or overeating- both will worsen stomach pain Call with any questions or concerns Hang in there!

## 2020-03-08 NOTE — Progress Notes (Signed)
   Subjective:    Patient ID: Dawn Hayes, female    DOB: 01-31-1971, 49 y.o.   MRN: [id number]  HPI abd pain- pain is epigastric/bilateral upper quadrants, L>R.  sxs started 2-3 days ago.  + nausea, no vomiting.  S/p bariatric surgery.  Increased BMs- loose stools.  No fevers.  Pain worsens w/ eating.  Pain described as stabbing.  Some increased GERD- has been 'taking a little bit of pepcid'.  Pt is 'constantly uncomfortable but the sharp pain comes and goes'.  No TTP.   Review of Systems For ROS see HPI   This visit occurred during the SARS-CoV-2 public health emergency.  Safety protocols were in place, including screening questions prior to the visit, additional usage of staff PPE, and extensive cleaning of exam room while observing appropriate contact time as indicated for disinfecting solutions.       Objective:   Physical Exam Vitals reviewed.  Constitutional:      General: She is not in acute distress.    Appearance: She is well-developed. She is not ill-appearing or toxic-appearing.  HENT:     Head: Normocephalic and atraumatic.  Abdominal:     Tenderness: There is abdominal tenderness in the right upper quadrant, epigastric area and left upper quadrant. There is no guarding or rebound. Negative signs include Murphy's sign.  Skin:    General: Skin is warm and dry.  Neurological:     General: No focal deficit present.     Mental Status: She is alert.  Psychiatric:        Mood and Affect: Mood normal. Mood is not anxious or depressed.        Behavior: Behavior normal.           Assessment & Plan:  Epigastric pain- new.  sxs started 2-3 days ago.  No change in medicines or diet.  Denies increased stress.  Check labs to r/o biliary abnormality or pancreatic issue.  Not currently on H2 blocker or PPI.  Sxs consistent w/ gastritis.  Start Famotidine 40mg  daily and add Carafate to protect stomach lining.  Reviewed supportive care and red flags that should prompt return.   Pt expressed understanding and is in agreement w/ plan.

## 2020-03-09 ENCOUNTER — Other Ambulatory Visit (INDEPENDENT_AMBULATORY_CARE_PROVIDER_SITE_OTHER): Payer: 59

## 2020-03-09 ENCOUNTER — Other Ambulatory Visit: Payer: Self-pay | Admitting: General Practice

## 2020-03-09 DIAGNOSIS — R748 Abnormal levels of other serum enzymes: Secondary | ICD-10-CM

## 2020-03-09 DIAGNOSIS — R1013 Epigastric pain: Secondary | ICD-10-CM

## 2020-03-09 LAB — CBC WITH DIFFERENTIAL/PLATELET
Basophils Absolute: 0.1 10*3/uL (ref 0.0–0.1)
Basophils Relative: 0.8 % (ref 0.0–3.0)
Eosinophils Absolute: 0.2 10*3/uL (ref 0.0–0.7)
Eosinophils Relative: 2.5 % (ref 0.0–5.0)
HCT: 41.7 % (ref 36.0–46.0)
Hemoglobin: 14.3 g/dL (ref 12.0–15.0)
Lymphocytes Relative: 36.2 % (ref 12.0–46.0)
Lymphs Abs: 2.4 10*3/uL (ref 0.7–4.0)
MCHC: 34.4 g/dL (ref 30.0–36.0)
MCV: 100.1 fl — ABNORMAL HIGH (ref 78.0–100.0)
Monocytes Absolute: 0.5 10*3/uL (ref 0.1–1.0)
Monocytes Relative: 7.6 % (ref 3.0–12.0)
Neutro Abs: 3.5 10*3/uL (ref 1.4–7.7)
Neutrophils Relative %: 52.9 % (ref 43.0–77.0)
Platelets: 203 10*3/uL (ref 150.0–400.0)
RBC: 4.16 Mil/uL (ref 3.87–5.11)
RDW: 12.8 % (ref 11.5–15.5)
WBC: 6.6 10*3/uL (ref 4.0–10.5)

## 2020-03-09 LAB — GAMMA GT: GGT: 141 U/L — ABNORMAL HIGH (ref 7–51)

## 2020-03-09 LAB — LIPASE: Lipase: 12 U/L (ref 11.0–59.0)

## 2020-03-09 LAB — HEPATIC FUNCTION PANEL
ALT: 61 U/L — ABNORMAL HIGH (ref 0–35)
AST: 47 U/L — ABNORMAL HIGH (ref 0–37)
Albumin: 4 g/dL (ref 3.5–5.2)
Alkaline Phosphatase: 190 U/L — ABNORMAL HIGH (ref 39–117)
Bilirubin, Direct: 0.1 mg/dL (ref 0.0–0.3)
Total Bilirubin: 0.4 mg/dL (ref 0.2–1.2)
Total Protein: 6.8 g/dL (ref 6.0–8.3)

## 2020-03-09 LAB — AMYLASE: Amylase: 36 U/L (ref 27–131)

## 2020-03-12 ENCOUNTER — Encounter: Payer: Self-pay | Admitting: Family Medicine

## 2020-03-15 ENCOUNTER — Telehealth: Payer: Self-pay | Admitting: *Deleted

## 2020-03-15 NOTE — Telephone Encounter (Signed)
RN received a report from after hour Nurse, D'Heur Lucia Gaskins stating that pt called with a complain of fever of 103 oral, upper abdominal pain and headache. Given her symptoms, patient will need to be seen virtually today with Dr. Birdie Riddle. Called pt at the provided number [phone] to assess if pt need to schedule a video visit with Dr. Birdie Riddle or go to ER for evaluation, no answer, left message on pt's identified VM to call us back to talk more about her symptoms. Call back number provided.

## 2020-03-20 ENCOUNTER — Other Ambulatory Visit: Payer: Self-pay | Admitting: Family Medicine

## 2020-03-20 NOTE — Telephone Encounter (Signed)
LFD 07/22/19 470mL with no refills LOV 03/08/20 NOV none

## 2020-03-21 ENCOUNTER — Ambulatory Visit
Admission: RE | Admit: 2020-03-21 | Discharge: 2020-03-21 | Disposition: A | Discharge status: Home / Self Care | Payer: 59 | Source: Ambulatory Visit | Attending: Family Medicine | Admitting: Family Medicine

## 2020-03-21 DIAGNOSIS — R1013 Epigastric pain: Secondary | ICD-10-CM

## 2020-05-22 ENCOUNTER — Other Ambulatory Visit: Payer: Self-pay | Admitting: Family Medicine

## 2020-07-03 ENCOUNTER — Ambulatory Visit: Payer: 59 | Admitting: Obstetrics & Gynecology

## 2020-07-09 ENCOUNTER — Encounter: Payer: Self-pay | Admitting: Family Medicine

## 2020-07-09 ENCOUNTER — Other Ambulatory Visit: Payer: Self-pay

## 2020-07-09 ENCOUNTER — Ambulatory Visit: Payer: 59 | Admitting: Family Medicine

## 2020-07-09 VITALS — BP 100/80 | HR 77 | Temp 97.8°F | Resp 16 | Ht 62.0 in | Wt 146.8 lb

## 2020-07-09 DIAGNOSIS — R748 Abnormal levels of other serum enzymes: Secondary | ICD-10-CM

## 2020-07-09 NOTE — Patient Instructions (Signed)
Follow up as needed or as scheduled We'll notify you of your lab results and make any changes if needed Keep up the good work!  You look great! Call with any questions or concerns Stay Safe!  Stay Healthy!!!

## 2020-07-09 NOTE — Progress Notes (Signed)
   Subjective:    Patient ID: Dawn Hayes, female    DOB: 1970/09/22, 50 y.o.   MRN: [id number]  HPI Abnormal liver functions- pt had lab work done w/ Rheum and was told liver enzymes were elevated.  No abd pain, N/V, no changes in stool color/texture.  Pt did see liver specialist due to hepatic steatosis noted on MRI and [place] in 2020.  Her most recent imaging has not shown any liver abnormalities.  Pt has lost considerable weight recently.  Is on Remicade infusions per Rheum.   Review of Systems For ROS see HPI   This visit occurred during the SARS-CoV-2 public health emergency.  Safety protocols were in place, including screening questions prior to the visit, additional usage of staff PPE, and extensive cleaning of exam room while observing appropriate contact time as indicated for disinfecting solutions.       Objective:   Physical Exam Vitals reviewed.  Constitutional:      General: She is not in acute distress.    Appearance: Normal appearance. She is not ill-appearing.  HENT:     Head: Normocephalic and atraumatic.  Abdominal:     General: Abdomen is flat. There is no distension.     Palpations: Abdomen is soft.     Tenderness: There is no abdominal tenderness. There is no guarding.  Skin:    General: Skin is warm and dry.     Coloration: Skin is not jaundiced.  Neurological:     General: No focal deficit present.     Mental Status: She is alert and oriented to person, place, and time.  Psychiatric:        Mood and Affect: Mood normal.        Behavior: Behavior normal.        Thought Content: Thought content normal.           Assessment & Plan:  Abnormal liver functions- pt has hx of abnormal liver functions and was told by Rheumatology that her recent labs were also abnormal.  I remember seeing these and reviewing them but I cannot recall the lab values and they have been sent to scan.  Will repeat today and determine next steps.  Pt expressed understanding and is  in agreement w/ plan.

## 2020-07-10 ENCOUNTER — Other Ambulatory Visit: Payer: Self-pay

## 2020-07-10 DIAGNOSIS — R748 Abnormal levels of other serum enzymes: Secondary | ICD-10-CM

## 2020-07-10 LAB — LIPASE: Lipase: 13 U/L (ref 11.0–59.0)

## 2020-07-10 LAB — GAMMA GT: GGT: 172 U/L — ABNORMAL HIGH (ref 7–51)

## 2020-07-10 LAB — AMYLASE: Amylase: 41 U/L (ref 27–131)

## 2020-07-10 LAB — HEPATIC FUNCTION PANEL
ALT: 70 U/L — ABNORMAL HIGH (ref 0–35)
AST: 40 U/L — ABNORMAL HIGH (ref 0–37)
Albumin: 4 g/dL (ref 3.5–5.2)
Alkaline Phosphatase: 97 U/L (ref 39–117)
Bilirubin, Direct: 0.1 mg/dL (ref 0.0–0.3)
Total Bilirubin: 0.4 mg/dL (ref 0.2–1.2)
Total Protein: 7.1 g/dL (ref 6.0–8.3)

## 2020-07-16 ENCOUNTER — Encounter: Payer: Self-pay | Admitting: Family Medicine

## 2020-07-20 ENCOUNTER — Other Ambulatory Visit: Payer: Self-pay

## 2020-07-20 ENCOUNTER — Ambulatory Visit (INDEPENDENT_AMBULATORY_CARE_PROVIDER_SITE_OTHER): Payer: 59 | Admitting: Emergency Medicine

## 2020-07-20 DIAGNOSIS — R748 Abnormal levels of other serum enzymes: Secondary | ICD-10-CM | POA: Diagnosis not present

## 2020-07-20 DIAGNOSIS — Z23 Encounter for immunization: Secondary | ICD-10-CM

## 2020-07-20 NOTE — Progress Notes (Signed)
Dawn Hayes is a 50 y.o. female presents to the office today for Twinrix injections, per physician's orders. Original order:Hepatitis A & Hepatitis B Vaccine(Twinrix) Twinrix (med), 11ml (dose),  IM (route) was administered Right deltoid (location) today. Patient tolerated injection.  Leonidas Romberg , CMA

## 2020-08-07 ENCOUNTER — Encounter (HOSPITAL_COMMUNITY): Payer: Self-pay | Admitting: *Deleted

## 2020-08-13 ENCOUNTER — Encounter: Payer: Self-pay | Admitting: Family Medicine

## 2020-08-14 ENCOUNTER — Other Ambulatory Visit: Payer: Self-pay

## 2020-08-14 DIAGNOSIS — R1013 Epigastric pain: Secondary | ICD-10-CM

## 2020-08-22 ENCOUNTER — Encounter: Payer: Self-pay | Admitting: Family Medicine

## 2020-08-22 ENCOUNTER — Other Ambulatory Visit: Payer: Self-pay | Admitting: Family Medicine

## 2020-08-22 MED ORDER — DULOXETINE HCL 60 MG PO CPEP: 60.0000 mg | ORAL_CAPSULE | Freq: Every day | ORAL | 1 refills | Status: DC

## 2020-08-28 ENCOUNTER — Encounter: Payer: Self-pay | Admitting: Family Medicine

## 2020-08-29 ENCOUNTER — Other Ambulatory Visit: Payer: Self-pay

## 2020-08-29 DIAGNOSIS — R1013 Epigastric pain: Secondary | ICD-10-CM

## 2020-08-31 ENCOUNTER — Encounter: Payer: Self-pay | Admitting: Family Medicine

## 2020-08-31 ENCOUNTER — Other Ambulatory Visit: Payer: Self-pay

## 2020-08-31 ENCOUNTER — Ambulatory Visit: Payer: 59 | Admitting: Family Medicine

## 2020-08-31 VITALS — BP 124/68 | HR 79 | Temp 97.7°F | Resp 16 | Ht 62.0 in | Wt 145.2 lb

## 2020-08-31 DIAGNOSIS — M62838 Other muscle spasm: Secondary | ICD-10-CM | POA: Diagnosis not present

## 2020-08-31 MED ORDER — METHOCARBAMOL 500 MG PO TABS: 500.0000 mg | ORAL_TABLET | Freq: Three times a day (TID) | ORAL | 0 refills | Status: DC

## 2020-08-31 MED ORDER — METHYLPREDNISOLONE ACETATE 80 MG/ML IJ SUSP
80.0000 mg | Freq: Once | INTRAMUSCULAR | Status: DC
  Administered 2020-08-31: 80 mg via INTRAMUSCULAR

## 2020-08-31 MED ORDER — METHYLPREDNISOLONE ACETATE 80 MG/ML IJ SUSP: 80.0000 mg | Freq: Once | INTRAMUSCULAR | Status: DC

## 2020-08-31 NOTE — Patient Instructions (Signed)
-sent you in a muscle relaxer to take as needed -steroid shot today to help -continue massages -if not getting better, please f/u with dr. Birdie Riddle. Also need to know if any tingling, weakness in the arm as you will need imaging.   So nice to meet you! Dr Rogers Blocker    Muscle Cramps and Spasms Muscle cramps and spasms are when muscles tighten by themselves. They usually get better within minutes. Muscle cramps are painful. They are usually stronger and last longer than muscle spasms. Muscle spasms may or may not be painful. They can last a few seconds or much longer. Cramps and spasms can affect any muscle, but they occur most often in the calf muscles of the leg. They are usually not caused by a serious problem. In many cases, the cause is not known. Some common causes include:  Doing more physical work or exercise than your body is ready for.  Using the muscles too much (overuse) by repeating certain movements too many times.  Staying in a certain position for a long time.  Playing a sport or doing an activity without preparing properly.  Using bad form or technique while playing a sport or doing an activity.  Not having enough water in your body (dehydration).  Injury.  Side effects of some medicines.  Low levels of the salts and minerals in your blood (electrolytes), such as low potassium or calcium. Follow these instructions at home: Managing pain and stiffness  Massage, stretch, and relax the muscle. Do this for many minutes at a time.  If told, put heat on tight or tense muscles as often as told by your doctor. Use the heat source that your doctor recommends, such as a moist heat pack or a heating pad. ? Place a towel between your skin and the heat source. ? Leave the heat on for 20-30 minutes. ? Remove the heat if your skin turns bright red. This is very important if you are not able to feel pain, heat, or cold. You may have a greater risk of getting burned.  If told, put ice  on the affected area. This may help if you are sore or have pain after a cramp or spasm. ? Put ice in a plastic bag. ? Place a towel between your skin and the bag. ? Leave the ice on for 20 minutes, 2-3 times a day.  Try taking hot showers or baths to help relax tight muscles.      Eating and drinking  Drink enough fluid to keep your pee (urine) pale yellow.  Eat a healthy diet to help ensure that your muscles work well. This should include: ? Fruits and vegetables. ? Lean protein. ? Whole grains. ? Low-fat or nonfat dairy products. General instructions  If you are having cramps often, avoid intense exercise for several days.  Take over-the-counter and prescription medicines only as told by your doctor.  Watch for any changes in your symptoms.  Keep all follow-up visits as told by your doctor. This is important. Contact a doctor if:  Your cramps or spasms get worse or happen more often.  Your cramps or spasms do not get better with time. Summary  Muscle cramps and spasms are when muscles tighten by themselves. They usually get better within minutes.  Cramps and spasms occur most often in the calf muscles of the leg.  Massage, stretch, and relax the muscle. This may help the cramp or spasm go away.  Drink enough fluid to keep your  pee (urine) pale yellow. This information is not intended to replace advice given to you by your health care provider. Make sure you discuss any questions you have with your health care provider. Document Revised: 09/21/2017 Document Reviewed: 09/21/2017 Elsevier Patient Education  [place].

## 2020-08-31 NOTE — Progress Notes (Signed)
Patient: Dawn Hayes MRN: [id number] DOB: 1971-01-19 PCP: Midge Minium, MD     Subjective:  Chief Complaint  Patient presents with  . Neck Pain    Pt notes has had shoulder and neck pain for 2 days, painful motions, lifting of weight is limited notes seems to be worsening     HPI: The patient is a 50 y.o. female who presents today for left arm and neck pain that started 2 days ago.   She is right handed. She states she can't really turn her neck and then she has some left arm pain. She can turn her head to the right and feels it in the left side of her neck. If she turns her head to the left, she has limited motion and can feel it down her left arm. She does have a hx of psoriatic arthritis, but has never has had a flair like and doesn't affect her muscles. Also has hx of fibromyalgia which could be contributing.   Left arm hurts from neck down to below her left elbow. can't really tell me if pain is radiating from neck.  Pain described as constant pain. No burning or tingling. No weakness in her left arm, but not using much because it hurts to use. Pain rated as a 4/10, but before the medication it was an 8-9/10. She denies any trauma, work out, heavy lifting. Nothing has been done out of the ordinary. She can't recall if she slept funny.   She took 1000mg  of tylenol and her belbuca patch she uses BID from pain management and feels a little bit better. She also has oxycodone from pain management. She is also on gabapentin at night 300-600mg . She has not tried this during the day.   Review of Systems  Constitutional: Negative for chills, fatigue and fever.  Gastrointestinal: Negative for constipation, nausea and vomiting.  Musculoskeletal: Positive for arthralgias, myalgias, neck pain and neck stiffness.    Allergies Patient is allergic to chloraprep one step [chlorhexidine gluconate], aspirin, and adhesive [tape].  Past Medical History Patient  has a past medical history of  Anemia, Arthritis, Costochondritis, Diverticulitis, Dysmenorrhea, Fibromyalgia, GERD (gastroesophageal reflux disease), Headache, History of hiatal hernia, Hypertension, Menorrhagia, Psoriatic arthritis ([place]), and Smoker.  Surgical History Patient  has a past surgical history that includes Esophagogastroduodenoscopy (09/11/99); Colonoscopy (1998); Cholecystectomy, laparoscopic (2006); Carpal tunnel release (Left, 01/19/2015); Cholecystectomy; Colonoscopy (N/A, 05/30/2015); Esophagogastroduodenoscopy (N/A, 05/30/2015); Dilatation & curettage/hysteroscopy with myosure (N/A, 01/04/2016); Laparoscopic hysterectomy (N/A, 04/08/2016); Laparoscopic bilateral salpingectomy (N/A, 04/08/2016); Cystoscopy (N/A, 04/08/2016); Hysterectomy abdominal with salpingectomy (Bilateral, 04/08/2016); Laparoscopic lysis of adhesions (04/08/2016); Abdominal hysterectomy; Colonoscopy with propofol (N/A, 03/09/2017); Givens capsule study (N/A, 08/14/2017); Gastric Roux-En-Y (N/A, 01/19/2018); and Laparoscopic lysis of adhesions (N/A, 01/19/2018).  Family History Pateint's family history includes Aneurysm in her maternal grandfather; Brain cancer in her father; Cancer in an other family member; Colon cancer (age of onset: 43) in her maternal grandmother; Hyperlipidemia in her mother; Hypertension in an other family member; Other in her paternal uncle; Prostate cancer in her father; Stroke in her paternal grandfather.  Social History Patient  reports that she has been smoking cigarettes. She has a 2.00 pack-year smoking history. She has never used smokeless tobacco. She reports current alcohol use of about 16.0 standard drinks of alcohol per week. She reports that she does not use drugs.    Objective: Vitals:   08/31/20 1120  BP: 124/68  Pulse: 79  Resp: 16  Temp: 97.7 F (36.5 C)  TempSrc:  Temporal  SpO2: 98%  Weight: 145 lb 3.2 oz (65.9 kg)  Height: 5\' 2"  (1.575 m)    Body mass index is 26.56 kg/m.  Physical Exam Vitals  reviewed.  Constitutional:      Appearance: Normal appearance. She is normal weight.  HENT:     Head: Normocephalic and atraumatic.  Neck:     Comments: Pain with neck movement, but ROM intact except limited to the left.  Pulmonary:     Effort: Pulmonary effort is normal.  Abdominal:     General: Abdomen is flat.     Palpations: Abdomen is soft.  Musculoskeletal:        General: Tenderness (TTP over left trapezius. she has palpable muscle spasm in this area. ) present.     Cervical back: Neck supple. Tenderness present.     Comments: Left shoulder ROM wnl. Negative passive arc, gerbers, empty can test. Bone spur on anterior aspect of her glenohumeral joint  Skin:    Findings: No rash.  Neurological:     General: No focal deficit present.     Mental Status: She is alert and oriented to person, place, and time.     Cranial Nerves: No cranial nerve deficit.     Sensory: No sensory deficit.     Motor: No weakness.     Coordination: Coordination normal.     Deep Tendon Reflexes: Reflexes normal.     Comments: Left arm strength intact. Sensation intact to dull and pin prick.         Assessment/plan: 1. Trapezius muscle spasm -already on multiple pain medication with pain management.  -sending in robaxin prn for muscle relaxer and handout given on muscle spams with conservative therapy with heat/ice/stretching.  -steroid shot . Side effects discussed. No steroids in past 3 months.  -precautions given, especially if any weakness, tingling, numbness or worsening pain as discussed will need imaging and to let her pcp know.   - methylPREDNISolone acetate (DEPO-MEDROL) injection 80 mg     Return if symptoms worsen or fail to improve.     Orma Flaming, MD [place]  08/31/2020

## 2020-09-05 ENCOUNTER — Other Ambulatory Visit: Payer: Self-pay

## 2020-09-05 ENCOUNTER — Encounter: Payer: Self-pay | Admitting: Family Medicine

## 2020-09-05 DIAGNOSIS — R1013 Epigastric pain: Secondary | ICD-10-CM

## 2020-09-06 ENCOUNTER — Encounter: Payer: Self-pay | Admitting: Family Medicine

## 2020-09-13 ENCOUNTER — Encounter: Payer: Self-pay | Admitting: Family Medicine

## 2020-09-17 ENCOUNTER — Encounter: Payer: Self-pay | Admitting: Family Medicine

## 2020-09-18 MED ORDER — BELBUCA 300 MCG BU FILM: 1.0000 | ORAL_FILM | Freq: Two times a day (BID) | BUCCAL | 0 refills | Status: DC

## 2020-09-19 ENCOUNTER — Encounter: Payer: Self-pay | Admitting: Family Medicine

## 2020-09-20 ENCOUNTER — Other Ambulatory Visit: Payer: Self-pay

## 2020-09-21 ENCOUNTER — Encounter: Payer: Self-pay | Admitting: Family Medicine

## 2020-09-21 ENCOUNTER — Telehealth: Payer: Self-pay | Admitting: Family Medicine

## 2020-09-21 ENCOUNTER — Ambulatory Visit: Payer: 59 | Admitting: Family Medicine

## 2020-09-21 VITALS — BP 116/80 | HR 78 | Temp 97.9°F | Ht 62.0 in | Wt 141.6 lb

## 2020-09-21 DIAGNOSIS — K649 Unspecified hemorrhoids: Secondary | ICD-10-CM

## 2020-09-21 MED ORDER — METHYLPHENIDATE HCL 20 MG PO TABS: 30.0000 mg | ORAL_TABLET | Freq: Two times a day (BID) | ORAL | 0 refills | Status: DC

## 2020-09-21 MED ORDER — HYDROCORTISONE ACETATE 25 MG RE SUPP: 25.0000 mg | Freq: Two times a day (BID) | RECTAL | 0 refills | Status: DC

## 2020-09-21 NOTE — Progress Notes (Signed)
Subjective:    Patient ID: Dawn Hayes, female    DOB: [birth year], 50 y.o.   MRN: [id number]  Chief Complaint  Patient presents with  . Hemorrhoids    Patient complains of hemorrhoids, x2 days,    HPI Patient was seen today for acute concern.  Pt endorses hemorrhoids causing burning and itching x 2 days.  Tried OTC proctozone cream x 1 days without relief.  Pt notes having hemorrhoids banded in the past.  Denies hematochezia, BRBPR, constipation, straining when having a BM, n/v, fever, or chills.  Planning to drive to the beach this wknd. Pt mentions within a 40 pound bag of chicken feed the day of symptoms.  Past Medical History:  Diagnosis Date  . Anemia   . Arthritis    psoriatic arthritis  . Costochondritis   . Diverticulitis   . Dysmenorrhea   . Fibromyalgia   . GERD (gastroesophageal reflux disease)   . Headache    Migraines  . History of hiatal hernia   . Hypertension   . Menorrhagia   . Psoriatic arthritis ([place])   . Smoker     Allergies  Allergen Reactions  . Chloraprep One Step [Chlorhexidine Gluconate] Itching and Rash  . Aspirin Other (See Comments)    Convulsions   . Adhesive [Tape] Itching and Rash    ROS General: Denies fever, chills, night sweats, changes in weight, changes in appetite HEENT: Denies headaches, ear pain, changes in vision, rhinorrhea, sore throat CV: Denies CP, palpitations, SOB, orthopnea Pulm: Denies SOB, cough, wheezing GI: Denies abdominal pain, nausea, vomiting, diarrhea, constipation +hemorrhoids GU: Denies dysuria, hematuria, frequency, vaginal discharge  Msk: Denies muscle cramps, joint pains Neuro: Denies weakness, numbness, tingling Skin: Denies rashes, bruising Psych: Denies depression, anxiety, hallucinations     Objective:    Blood pressure 116/80, pulse 78, temperature 97.9 F (36.6 C), temperature source Oral, height 5\' 2"  (1.575 m), weight 141 lb 9.6 oz (64.2 kg), last menstrual period 02/28/2016, SpO2 97  %.  Gen. Pleasant, well-nourished, in no distress, normal affect   HEENT: [place]/AT, face symmetric, conjunctiva clear, no scleral icterus, PERRLA, EOMI, nares patent without drainage Lungs: no accessory muscle use Cardiovascular: RRR, no peripheral edema GU: Normal external female genitalia.  A 1 cm hemorrhoid, noninflamed, nonthrombosed noted at anus externally.  Normal rectal tone internal hemorrhoid appreciated.  No stool in rectal vault.  Guaiac negative. Musculoskeletal: No deformities, no cyanosis or clubbing, normal tone Neuro:  A&Ox3, CN II-XII intact, normal gait Skin:  Warm, no lesions/ rash   Wt Readings from Last 3 Encounters:  09/21/20 141 lb 9.6 oz (64.2 kg)  08/31/20 145 lb 3.2 oz (65.9 kg)  07/09/20 146 lb 12.8 oz (66.6 kg)    Lab Results  Component Value Date   WBC 6.6 03/08/2020   HGB 14.3 03/08/2020   HCT 41.7 03/08/2020   PLT 203.0 03/08/2020   GLUCOSE 135 (H) 12/06/2019   CHOL 258 (H) 09/10/2017   TRIG 170.0 (H) 09/10/2017   HDL 77.40 09/10/2017   LDLCALC 146 (H) 09/10/2017   ALT 70 (H) 07/09/2020   AST 40 (H) 07/09/2020   NA 136 12/06/2019   K 4.0 12/06/2019   CL 106 12/06/2019   CREATININE 0.50 12/06/2019   BUN 13 12/06/2019   CO2 18 (L) 12/06/2019   TSH 1.51 02/21/2019   HGBA1C 6.2 09/11/2017    Assessment/Plan:  Hemorrhoids, unspecified hemorrhoid type  -Likely caused by heavy lifting. -Guaiac negative on DRE -Discussed dietary modifications -Consider  bowel regimen such as Colace and senna or MiraLAX to avoid straining with BMs. -Advised to take breaks during road trip to avoid prolonged sitting -Start Anusol suppository -For continued or worsening symptoms follow-up with GI given history of banding. - Plan: hydrocortisone (ANUSOL-HC) 25 MG suppository  F/u as needed  Grier Mitts, MD

## 2020-09-21 NOTE — Telephone Encounter (Signed)
The patient is needing a different medication called in because the insurance doesn't cover hydrocortisone (ANUSOL-HC) 25 MG suppository

## 2020-09-21 NOTE — Patient Instructions (Addendum)

## 2020-09-27 ENCOUNTER — Other Ambulatory Visit: Payer: Self-pay

## 2020-09-27 ENCOUNTER — Ambulatory Visit: Payer: 59 | Admitting: Family Medicine

## 2020-09-27 ENCOUNTER — Encounter: Payer: Self-pay | Admitting: Family Medicine

## 2020-09-27 VITALS — BP 120/80 | HR 86 | Temp 98.0°F | Resp 17 | Ht 62.0 in | Wt 138.8 lb

## 2020-09-27 DIAGNOSIS — F17210 Nicotine dependence, cigarettes, uncomplicated: Secondary | ICD-10-CM

## 2020-09-27 DIAGNOSIS — L4050 Arthropathic psoriasis, unspecified: Secondary | ICD-10-CM | POA: Diagnosis not present

## 2020-09-27 DIAGNOSIS — F900 Attention-deficit hyperactivity disorder, predominantly inattentive type: Secondary | ICD-10-CM | POA: Diagnosis not present

## 2020-09-27 DIAGNOSIS — M940 Chondrocostal junction syndrome [Tietze]: Secondary | ICD-10-CM

## 2020-09-27 DIAGNOSIS — Z79891 Long term (current) use of opiate analgesic: Secondary | ICD-10-CM

## 2020-09-27 MED ORDER — CHANTIX STARTING MONTH PAK 0.5 MG X 11 & 1 MG X 42 PO TABS: ORAL_TABLET | ORAL | 0 refills | Status: DC

## 2020-09-27 NOTE — Patient Instructions (Addendum)
Schedule your complete physical in 3 months We'll notify you of your lab results and make any changes if needed START the Chantix as directed to quit smoking!  You Can Do It! Please let me know when you need refills Call with any questions or concerns Have a great summer!!!

## 2020-09-27 NOTE — Assessment & Plan Note (Signed)
Pt had quit for bariatric surgery but unfortunately has resumed smoking.  She previously did well on Chantix and is asking to restart.  Prescription sent.  Will follow.

## 2020-09-27 NOTE — Progress Notes (Signed)
   Subjective:    Patient ID: Dawn Hayes, female    DOB: 1970-10-01, 50 y.o.   MRN: [id number]  HPI Pain management- pt has been followed by [facility] for both the psoriatic arthritis and costochondritis.  Pt is maintained on Belbuca 300mg  BID.  States this provides better relief and for longer periods of time than the oxycodone did.  Pt prefers to have oxycodone available to use as needed for costochondritis flairs rather than go to the ER.  Also on Gabapentin 300mg  nightly and Cymbalta 60mg  daily  ADHD- pt was officially dx'd by Dr Rachel Moulds  She has been on Methylphenidate 300mg  (1.5 tabs) BID.  Feels this controls her sxs.  Pt feels that her focus and productivity are much better with this medication.  No CP, palpitations, SOB.  Pt reports she is able to sleep  Tobacco use- pt is smoking 1-2 cigs/night.  She has successfully quit w/ Chantix in the past.  Wants to resume Chantix   Review of Systems For ROS see HPI   This visit occurred during the SARS-CoV-2 public health emergency.  Safety protocols were in place, including screening questions prior to the visit, additional usage of staff PPE, and extensive cleaning of exam room while observing appropriate contact time as indicated for disinfecting solutions.       Objective:   Physical Exam Vitals reviewed.  Constitutional:      General: She is not in acute distress.    Appearance: Normal appearance. She is well-developed.  HENT:     Head: Normocephalic and atraumatic.  Eyes:     Conjunctiva/sclera: Conjunctivae normal.     Pupils: Pupils are equal, round, and reactive to light.  Neck:     Thyroid: No thyromegaly.  Cardiovascular:     Rate and Rhythm: Normal rate and regular rhythm.     Pulses: Normal pulses.     Heart sounds: Normal heart sounds. No murmur heard.   Pulmonary:     Effort: Pulmonary effort is normal. No respiratory distress.     Breath sounds: Normal breath sounds.  Abdominal:     General: There is  no distension.     Palpations: Abdomen is soft.     Tenderness: There is no abdominal tenderness.  Musculoskeletal:     Cervical back: Normal range of motion and neck supple.     Right lower leg: No edema.     Left lower leg: No edema.  Lymphadenopathy:     Cervical: No cervical adenopathy.  Skin:    General: Skin is warm and dry.  Neurological:     Mental Status: She is alert and oriented to person, place, and time.  Psychiatric:        Behavior: Behavior normal.           Assessment & Plan:

## 2020-09-27 NOTE — Assessment & Plan Note (Signed)
See notes under costochondritis for full plan.

## 2020-09-27 NOTE — Assessment & Plan Note (Signed)
Pt has been following w/ pain management for recurrent costochondritis flares- many of which have resulted in ER visits.  She reports pain is pretty well managed by Belbuca BID and she only uses oxycodone sparingly for breakthrough pain.  She is interested in centralizing her care and no longer wants to see pain management if possible.  Will get UDS today and she will sign a controlled substance contract and as long as things remain stable, I will fill her maintenance meds.  If things need to be adjusted or doses escalated, she will need to go back to pain management.

## 2020-09-27 NOTE — Assessment & Plan Note (Signed)
Pt was dx'd by Dr Rachel Moulds at [facility] and is treated w/ Methylphenidate 30mg  BID.  Again, things have been stable and at this time we are able to assume care for her prescriptions.  However if meds need to be adjusted, she will need to go back to her attention specialist.

## 2020-09-28 NOTE — Telephone Encounter (Signed)
Did her insurance company say what they are willing to pay for?

## 2020-09-29 LAB — URINE DRUGS OF ABUSE SCREEN W ALC, ROUTINE (REF LAB)
Amphetamines, Urine: NEGATIVE ng/mL
Barbiturate Quant, Ur: NEGATIVE ng/mL
Benzodiazepine Quant, Ur: NEGATIVE ng/mL
Cannabinoid Quant, Ur: NEGATIVE ng/mL
Cocaine (Metab.): NEGATIVE ng/mL
Ethanol, Urine: NEGATIVE %
Methadone Screen, Urine: NEGATIVE ng/mL
Opiate Quant, Ur: NEGATIVE ng/mL
PCP Quant, Ur: NEGATIVE ng/mL
Propoxyphene: NEGATIVE ng/mL

## 2020-10-16 ENCOUNTER — Telehealth: Payer: Self-pay

## 2020-10-16 ENCOUNTER — Telehealth: Payer: Self-pay | Admitting: Family Medicine

## 2020-10-16 ENCOUNTER — Encounter: Payer: Self-pay | Admitting: Family Medicine

## 2020-10-16 NOTE — Telephone Encounter (Signed)
Requesting:Bellbuca 355mcg Contract: UDS: Last Visit:09/27/20 Next Visit:n/a Last Refill:09/18/20  Ritalin 20mg  LR 09/21/20  Please Advise

## 2020-10-16 NOTE — Telephone Encounter (Signed)
Patient said that the pharmacy told her that Dr. Birdie Riddle would need to send in a new prescription for methylphenidate.  [facility] - [place]

## 2020-10-17 MED ORDER — BELBUCA 300 MCG BU FILM: 1.0000 | ORAL_FILM | Freq: Two times a day (BID) | BUCCAL | 0 refills | Status: DC

## 2020-10-17 MED ORDER — METHYLPHENIDATE HCL 20 MG PO TABS: 30.0000 mg | ORAL_TABLET | Freq: Two times a day (BID) | ORAL | 0 refills | Status: DC

## 2020-10-17 NOTE — Telephone Encounter (Signed)
Prescriptions filled at pt's request 

## 2020-10-17 NOTE — Telephone Encounter (Signed)
Prescriptions filled in previous phone note

## 2020-10-17 NOTE — Addendum Note (Signed)
Addended by: Midge Minium on: 10/17/2020 07:23 AM   Modules accepted: Orders

## 2020-10-18 ENCOUNTER — Telehealth: Payer: Self-pay | Admitting: Family Medicine

## 2020-10-18 NOTE — Telephone Encounter (Signed)
Pt called in asking if the referral to Dawn Hayes is still needed. She states that she wanted to know if Dr. Birdie Riddle is still going to continue prescribing the [place] or should she get that moving forward from pain Hayes .  Please advise

## 2020-10-18 NOTE — Telephone Encounter (Signed)
I would prefer she keep her appt with pain management as I don't typically manage chronic pain.  They may have better options/alternatives for her

## 2020-10-23 ENCOUNTER — Encounter: Payer: Self-pay | Admitting: Family Medicine

## 2020-10-24 MED ORDER — OXYCODONE HCL 10 MG PO TABS: 10.0000 mg | ORAL_TABLET | Freq: Three times a day (TID) | ORAL | 0 refills | Status: DC | PRN

## 2020-10-26 ENCOUNTER — Ambulatory Visit (INDEPENDENT_AMBULATORY_CARE_PROVIDER_SITE_OTHER): Payer: 59

## 2020-10-26 ENCOUNTER — Other Ambulatory Visit: Payer: Self-pay

## 2020-10-26 DIAGNOSIS — R1011 Right upper quadrant pain: Secondary | ICD-10-CM

## 2020-10-26 MED ORDER — METHYLPREDNISOLONE ACETATE 80 MG/ML IJ SUSP
80.0000 mg | Freq: Once | INTRAMUSCULAR | Status: AC
  Administered 2020-10-26: 80 mg via INTRAMUSCULAR

## 2020-10-26 NOTE — Progress Notes (Signed)
Patient presents to the office to receive a methylprednisolone acetate injectable suspension 80mg  for pain. Patient tolerated well and will update [place] on how she feels.

## 2020-10-30 ENCOUNTER — Encounter: Payer: Self-pay | Admitting: Family Medicine

## 2020-10-31 ENCOUNTER — Other Ambulatory Visit: Payer: Self-pay

## 2020-10-31 ENCOUNTER — Encounter: Payer: Self-pay | Admitting: Family Medicine

## 2020-10-31 ENCOUNTER — Ambulatory Visit: Payer: 59 | Admitting: Family Medicine

## 2020-10-31 VITALS — BP 120/88 | HR 91 | Temp 98.0°F | Resp 17 | Ht 62.0 in | Wt 142.6 lb

## 2020-10-31 DIAGNOSIS — M940 Chondrocostal junction syndrome [Tietze]: Secondary | ICD-10-CM | POA: Diagnosis not present

## 2020-10-31 MED ORDER — PREDNISONE 10 MG PO TABS: ORAL_TABLET | ORAL | 0 refills | Status: DC

## 2020-10-31 MED ORDER — METHYLPREDNISOLONE ACETATE 80 MG/ML IJ SUSP
80.0000 mg | Freq: Once | INTRAMUSCULAR | Status: AC
Start: 2020-10-31 — End: 2020-10-31
  Administered 2020-10-31: 80 mg via INTRAMUSCULAR

## 2020-10-31 NOTE — Patient Instructions (Signed)
Please schedule your complete physical at your convenience START the Prednisone taper tomorrow morning w/ food Continue your regular pain medications and use the Oxycodone as needed for severe pain Call with any questions or concerns Hang in there!

## 2020-10-31 NOTE — Progress Notes (Signed)
Patient presents to the office for a visit with provider for Costochondritis. Per provider 80 mg/ml was injected into patient left ventrogluteal. Patient tolerated well and is to follow up as needed per provider.

## 2020-10-31 NOTE — Progress Notes (Signed)
   Subjective:    Patient ID: Dawn Hayes, female    DOB: Dec 14, 1970, 50 y.o.   MRN: [id number]  HPI Costochondritis- pt has hx of multiple severe flares.  Has been to the ER multiple times for similar and has had full work ups.  She is on Belbuca, Cymbalta, Gabapentin, Methocarbamol for chronic pain.  She was given Oxycodone 10mg  to use PRN when this flare started.  She had a depo-medrol shot in the office last week but she says she is still in pain.  Pt is wearing her Tens unit today.  Pt is unable to lie down, painful to move or breathe,- particularly bending over.  TTP over chest.  Pt reports this feels similar to previous episodes.  Pt has not had a flare in over 6 months so she has been more physically active.  She developed her pain after moving heavy furniture.   Review of Systems For ROS see HPI   This visit occurred during the SARS-CoV-2 public health emergency.  Safety protocols were in place, including screening questions prior to the visit, additional usage of staff PPE, and extensive cleaning of exam room while observing appropriate contact time as indicated for disinfecting solutions.      Objective:   Physical Exam Vitals reviewed.  Constitutional:      General: She is not in acute distress.    Appearance: Normal appearance. She is not ill-appearing.  HENT:     Head: Normocephalic and atraumatic.  Eyes:     Extraocular Movements: Extraocular movements intact.     Conjunctiva/sclera: Conjunctivae normal.     Pupils: Pupils are equal, round, and reactive to light.  Cardiovascular:     Rate and Rhythm: Normal rate and regular rhythm.     Pulses: Normal pulses.     Heart sounds: Normal heart sounds.  Pulmonary:     Effort: Pulmonary effort is normal. No respiratory distress.     Breath sounds: No wheezing or rales.  Chest:     Chest wall: Tenderness (TTP over anterior chest) present.  Neurological:     General: No focal deficit present.     Mental Status: She is  alert and oriented to person, place, and time.  Psychiatric:        Mood and Affect: Mood normal.        Behavior: Behavior normal.        Thought Content: Thought content normal.          Assessment & Plan:   Costochondritis- deteriorated.  Pt has hx of this and has had multiple cardiac workups b/c of it.  States this feels similar to her previous episodes and is not concerned for cardiac etiology of pain.  Since she is not having relief w/ her previous depomedrol shot or the oxycodone, will do repeat DepoMedrol shot and add Prednisone taper.  Reviewed supportive care and red flags that should prompt return. Pt expressed understanding and is in agreement w/ plan.

## 2020-11-01 ENCOUNTER — Encounter: Payer: Self-pay | Admitting: Family Medicine

## 2020-11-02 ENCOUNTER — Other Ambulatory Visit: Payer: Self-pay

## 2020-11-02 MED ORDER — OXYCODONE HCL 10 MG PO TABS: 10.0000 mg | ORAL_TABLET | Freq: Three times a day (TID) | ORAL | 0 refills | Status: DC | PRN

## 2020-11-02 NOTE — Progress Notes (Signed)
Depo medrol order was mine- dx costochondritis

## 2020-11-02 NOTE — Telephone Encounter (Signed)
Good evening.  I started the oral taper today but I am out of the pain meds. Can you refill those to help me finish getting over this flare? I hopefully will not need them again before I see pain mngmnt. I don't want to be without anything over the weekend.  If I have to, I can go to ER but last resort.  Txs CK LFD 10/24/20 #20 with no refills LOV 10/31/20 NOV 11/26/20

## 2020-11-06 ENCOUNTER — Encounter: Payer: Self-pay | Admitting: Orthopaedic Surgery

## 2020-11-06 ENCOUNTER — Ambulatory Visit (INDEPENDENT_AMBULATORY_CARE_PROVIDER_SITE_OTHER): Payer: 59 | Admitting: Orthopaedic Surgery

## 2020-11-06 ENCOUNTER — Ambulatory Visit: Payer: Self-pay

## 2020-11-06 DIAGNOSIS — G8929 Other chronic pain: Secondary | ICD-10-CM | POA: Diagnosis not present

## 2020-11-06 DIAGNOSIS — M25512 Pain in left shoulder: Secondary | ICD-10-CM | POA: Diagnosis not present

## 2020-11-06 MED ORDER — METHYLPREDNISOLONE ACETATE 40 MG/ML IJ SUSP
40.0000 mg | INTRAMUSCULAR | Status: AC | PRN
  Administered 2020-11-06: 40 mg via INTRA_ARTICULAR

## 2020-11-06 MED ORDER — BUPIVACAINE HCL 0.25 % IJ SOLN
2.0000 mL | INTRAMUSCULAR | Status: AC | PRN
  Administered 2020-11-06: 2 mL via INTRA_ARTICULAR

## 2020-11-06 MED ORDER — LIDOCAINE HCL 2 % IJ SOLN
2.0000 mL | INTRAMUSCULAR | Status: AC | PRN
  Administered 2020-11-06: 2 mL

## 2020-11-06 NOTE — Progress Notes (Signed)
Office Visit Note   Patient: Dawn Hayes           Date of Birth: 06-05-1970           MRN: [id number] Visit Date: 11/06/2020              Requested by: Midge Minium, MD [address] [place],  [place] [address] PCP: Midge Minium, MD   Assessment & Plan: Visit Diagnoses:  1. Chronic left shoulder pain     Plan: Impression is recurrent left shoulder subacromial bursitis with underlying A/C joint degenerative changes.  I believe the patient's symptoms are more consistent with bursitis.  She notes this is very similar to the previous pain she was experiencing almost 2 years ago which significantly helped following subacromial cortisone injection.  We have discussed repeat subacromial cortisone injection today for this reason.  She agrees and would like to proceed.  Should the pain in the [facility] joint persist following the injection, we will refer her to Dr. Junius Roads for ultrasound-guided cortisone.  She will otherwise follow-up with [place] as needed.  Follow-Up Instructions: Return if symptoms worsen or fail to improve.   Orders:  Orders Placed This Encounter  Procedures   Large Joint Inj: L subacromial bursa   XR Shoulder Left   No orders of the defined types were placed in this encounter.     Procedures: Large Joint Inj: L subacromial bursa on 11/06/2020 3:55 PM Indications: pain Details: 22 G needle Medications: 2 mL lidocaine 2 %; 2 mL bupivacaine 0.25 %; 40 mg methylPREDNISolone acetate 40 MG/ML Outcome: tolerated well, no immediate complications Patient was prepped and draped in the usual sterile fashion.      Clinical Data: No additional findings.   Subjective: Chief Complaint  Patient presents with   Left Shoulder - Pain    HPI patient is a pleasant 50 year old female who comes in today with recurrent left shoulder pain.  She was seen by [place] in October 2020 for the same issue.  Subacromial injection performed which significantly helped until a few  weeks ago.  The pain has returned and is throughout the entire shoulder and radiates down into the deltoid.  She describes this as a constant ache.  Pain is worse with any lifting of the arm as well as when she types.  She takes Belbuca for chronic pain.  She denies any paresthesias to the left upper extremity.  Review of Systems as detailed in HPI.  All others reviewed and are negative.   Objective: Vital Signs: LMP 02/28/2016   Physical Exam well-developed well-nourished female in no acute distress.  Alert and oriented x3.  Ortho Exam left shoulder exam shows near full range of motion but this is with pain at the extremes.  She can internally rotate to T12.  Positive empty can.  Positive cross body adduction.  She does have moderate tenderness to the [facility] joint.  No focal weakness.  She is neurovascular intact distally.  Specialty Comments:  No specialty comments available.  Imaging: No results found.   PMFS History: Patient Active Problem List   Diagnosis Date Noted   ADHD (attention deficit hyperactivity disorder), inattentive type 09/27/2020   Cigarette smoker 09/27/2020   Carpal tunnel syndrome, right 06/10/2019   Bariatric surgery status 01/26/2018   RLS (restless legs syndrome) 01/26/2018   Vitamin D deficiency 11/10/2017   Fibromyalgia 11/10/2017   Anxiety and depression 05/27/2016   Status post total abdominal hysterectomy 04/08/2016  Costochondritis 12/04/2015   Anemia, iron deficiency 10/10/2015   Tick bites 10/10/2015   Allergic rhinitis 09/07/2015   Hemorrhoid    Diverticulosis of colon without hemorrhage    Hiatal hernia    Rectal bleeding 05/08/2015   Abnormal finding on EKG 09/16/2013   Psoriatic arthritis ([place]) 09/16/2013   GERD 12/11/2009   DIARRHEA, CHRONIC 12/11/2009   Past Medical History:  Diagnosis Date   Anemia    Arthritis    psoriatic arthritis   Costochondritis    Diverticulitis    Dysmenorrhea    Fibromyalgia    GERD (gastroesophageal  reflux disease)    Headache    Migraines   History of hiatal hernia    Hypertension    Menorrhagia    Psoriatic arthritis ([place])    Smoker     Family History  Problem Relation Age of Onset   Brain cancer Father    Prostate cancer Father    Hyperlipidemia Mother    Colon cancer Maternal Grandmother 69   Stroke Paternal Grandfather        COD   Aneurysm Maternal Grandfather    Cancer Other    Hypertension Other    Other Paternal Uncle        gastric polyps    Past Surgical History:  Procedure Laterality Date   ABDOMINAL HYSTERECTOMY     CARPAL TUNNEL RELEASE Left 01/19/2015   Procedure: LEFT CARPAL TUNNEL RELEASE;  Surgeon: Roseanne Kaufman, MD;  Location: [facility];  Service: Orthopedics;  Laterality: Left;   CHOLECYSTECTOMY     CHOLECYSTECTOMY, LAPAROSCOPIC  2006   COLONOSCOPY  1998   normal, [place]   COLONOSCOPY N/A 05/30/2015   RMR: Minimal anal canal hemorrhoids. colonic diverticulosis. I suspect relatively trivial recent gI Bleed likely related to hemorrhoids. This finding alone would not likely adequatly explain her degree of anemia. she likely has a significent component from menstrual losses. , etc.    COLONOSCOPY WITH PROPOFOL N/A 03/09/2017   Dr. Gala Romney: scattered diverticula, distal 5cm of TI normal. Grade II non-bleeding internal hemorrhoids. next TCS 10 years.    CYSTOSCOPY N/A 04/08/2016   Procedure: CYSTOSCOPY;  Surgeon: Nunzio Cobbs, MD;  Location: [facility];  Service: Gynecology;  Laterality: N/A;   DILATATION & CURETTAGE/HYSTEROSCOPY WITH MYOSURE N/A 01/04/2016   Procedure: DILATATION & CURETTAGE/HYSTEROSCOPY;  Surgeon: Nunzio Cobbs, MD;  Location: [facility];  Service: Gynecology;  Laterality: N/A;   ESOPHAGOGASTRODUODENOSCOPY  09/11/99   tiny esophageal erosions with mild erosive reflux/no barrett's/normal stomach   ESOPHAGOGASTRODUODENOSCOPY N/A 05/30/2015   RMR: Hiatal hernia otherwise negative EGD   GASTRIC ROUX-EN-Y N/A  01/19/2018   Procedure: LAPAROSCOPIC ROUX-EN-Y GASTRIC BYPASS WITH HIATAL HERNIA REPAIR, WITH UPPER ENDOSCOPY, ERAS Pathway;  Surgeon: Greer Pickerel, MD;  Location: [facility];  Service: General;  Laterality: N/A;   GIVENS CAPSULE STUDY N/A 08/14/2017   Procedure: GIVENS CAPSULE STUDY;  Surgeon: Daneil Dolin, MD;  [facility];  Service: Endoscopy;  Laterality: N/A;  7:30am   HYSTERECTOMY ABDOMINAL WITH SALPINGECTOMY Bilateral 04/08/2016   Procedure: HYSTERECTOMY ABDOMINAL WITH SALPINGECTOMY;  Surgeon: Nunzio Cobbs, MD;  Location: [facility];  Service: Gynecology;  Laterality: Bilateral;   LAPAROSCOPIC BILATERAL SALPINGECTOMY N/A 04/08/2016   Procedure: LAPAROSCOPIC BILATERAL SALPINGECTOMY possible BSO;  Surgeon: Nunzio Cobbs, MD;  Location: [facility];  Service: Gynecology;  Laterality: N/A;   LAPAROSCOPIC HYSTERECTOMY N/A 04/08/2016   Procedure: ATTEMPTED HYSTERECTOMY TOTAL LAPAROSCOPIC;  Surgeon:  Brook Oletta Lamas, MD;  Location: [facility];  Service: Gynecology;  Laterality: N/A;   LAPAROSCOPIC LYSIS OF ADHESIONS  04/08/2016   Procedure: LAPAROSCOPIC LYSIS OF ADHESIONS;  Surgeon: Nunzio Cobbs, MD;  Location: [facility];  Service: Gynecology;;   LAPAROSCOPIC LYSIS OF ADHESIONS N/A 01/19/2018   Procedure: LAPAROSCOPIC LYSIS OF ADHESIONS;  Surgeon: Greer Pickerel, MD;  Location: [facility];  Service: General;  Laterality: N/A;   Social History   Occupational History   Occupation: [employer], Sport and exercise psychologist  Tobacco Use   Smoking status: Light Smoker    Packs/day: 0.10    Years: 20.00    Pack years: 2.00    Types: Cigarettes   Smokeless tobacco: Never   Tobacco comments:    pt still smoking 1-2 cigs/day  Vaping Use   Vaping Use: Never used  Substance and Sexual Activity   Alcohol use: Yes    Alcohol/week: 16.0 standard drinks    Types: 6 Glasses of wine, 10 Standard drinks or equivalent per week    Comment: socially, weekends 4-5   Drug use: No   Sexual  activity: Yes    Partners: Male    Birth control/protection: None

## 2020-11-07 ENCOUNTER — Encounter: Payer: Self-pay | Admitting: *Deleted

## 2020-11-07 ENCOUNTER — Encounter: Payer: Self-pay | Admitting: Family Medicine

## 2020-11-08 ENCOUNTER — Other Ambulatory Visit: Payer: Self-pay | Admitting: Family Medicine

## 2020-11-08 NOTE — Telephone Encounter (Signed)
LFD 10/17/20 #60 with no refills LOV 10/31/20 NOV 11/26/20

## 2020-11-09 ENCOUNTER — Other Ambulatory Visit: Payer: Self-pay | Admitting: Family Medicine

## 2020-11-09 ENCOUNTER — Other Ambulatory Visit: Payer: Self-pay

## 2020-11-09 MED ORDER — BELBUCA 300 MCG BU FILM: 1.0000 | ORAL_FILM | Freq: Two times a day (BID) | BUCCAL | 0 refills | Status: DC

## 2020-11-15 ENCOUNTER — Encounter: Payer: Self-pay | Admitting: Family Medicine

## 2020-11-16 ENCOUNTER — Other Ambulatory Visit: Payer: Self-pay

## 2020-11-16 MED ORDER — METHYLPHENIDATE HCL 20 MG PO TABS: 30.0000 mg | ORAL_TABLET | Freq: Two times a day (BID) | ORAL | 0 refills | Status: DC

## 2020-11-16 NOTE — Telephone Encounter (Signed)
   Good afternoon!  Can you call in or send [facility] the ok to refill my Methylphenidate for next week?  It is due around the 13. This is the one they said you had to send each month. I cannot request a refill through them.  Last filled 10/17/20 #90 with no refills Last office visit 10/31/20 Next office visit 11/26/20

## 2020-11-26 ENCOUNTER — Encounter: Payer: Self-pay | Admitting: Family Medicine

## 2020-11-26 ENCOUNTER — Other Ambulatory Visit: Payer: Self-pay

## 2020-11-26 ENCOUNTER — Ambulatory Visit (INDEPENDENT_AMBULATORY_CARE_PROVIDER_SITE_OTHER): Payer: 59 | Admitting: Family Medicine

## 2020-11-26 VITALS — BP 120/78 | HR 67 | Temp 97.7°F | Resp 17 | Ht 61.0 in | Wt 140.0 lb

## 2020-11-26 DIAGNOSIS — Z114 Encounter for screening for human immunodeficiency virus [HIV]: Secondary | ICD-10-CM

## 2020-11-26 DIAGNOSIS — Z1159 Encounter for screening for other viral diseases: Secondary | ICD-10-CM | POA: Diagnosis not present

## 2020-11-26 DIAGNOSIS — E559 Vitamin D deficiency, unspecified: Secondary | ICD-10-CM | POA: Diagnosis not present

## 2020-11-26 DIAGNOSIS — Z0000 Encounter for general adult medical examination without abnormal findings: Secondary | ICD-10-CM | POA: Diagnosis not present

## 2020-11-26 DIAGNOSIS — E663 Overweight: Secondary | ICD-10-CM

## 2020-11-26 DIAGNOSIS — Z23 Encounter for immunization: Secondary | ICD-10-CM

## 2020-11-26 LAB — CBC WITH DIFFERENTIAL/PLATELET
Basophils Absolute: 0 10*3/uL (ref 0.0–0.1)
Basophils Relative: 0.8 % (ref 0.0–3.0)
Eosinophils Absolute: 0.2 10*3/uL (ref 0.0–0.7)
Eosinophils Relative: 3 % (ref 0.0–5.0)
HCT: 41.8 % (ref 36.0–46.0)
Hemoglobin: 14.3 g/dL (ref 12.0–15.0)
Lymphocytes Relative: 46.4 % — ABNORMAL HIGH (ref 12.0–46.0)
Lymphs Abs: 2.6 10*3/uL (ref 0.7–4.0)
MCHC: 34.3 g/dL (ref 30.0–36.0)
MCV: 99.9 fl (ref 78.0–100.0)
Monocytes Absolute: 0.4 10*3/uL (ref 0.1–1.0)
Monocytes Relative: 6.7 % (ref 3.0–12.0)
Neutro Abs: 2.4 10*3/uL (ref 1.4–7.7)
Neutrophils Relative %: 43.1 % (ref 43.0–77.0)
Platelets: 238 10*3/uL (ref 150.0–400.0)
RBC: 4.18 Mil/uL (ref 3.87–5.11)
RDW: 12.7 % (ref 11.5–15.5)
WBC: 5.6 10*3/uL (ref 4.0–10.5)

## 2020-11-26 LAB — BASIC METABOLIC PANEL
BUN: 19 mg/dL (ref 6–23)
CO2: 27 mEq/L (ref 19–32)
Calcium: 9.8 mg/dL (ref 8.4–10.5)
Chloride: 101 mEq/L (ref 96–112)
Creatinine, Ser: 0.57 mg/dL (ref 0.40–1.20)
GFR: 106.19 mL/min (ref >60.00)
Glucose, Bld: 75 mg/dL (ref 70–99)
Potassium: 4.1 mEq/L (ref 3.5–5.1)
Sodium: 138 mEq/L (ref 135–145)

## 2020-11-26 LAB — LIPID PANEL
Cholesterol: 234 mg/dL — ABNORMAL HIGH (ref 0–200)
HDL: 105.3 mg/dL (ref >39.00)
LDL Cholesterol: 92 mg/dL (ref 0–99)
NonHDL: 129.1
Total CHOL/HDL Ratio: 2
Triglycerides: 184 mg/dL — ABNORMAL HIGH (ref 0.0–149.0)
VLDL: 36.8 mg/dL (ref 0.0–40.0)

## 2020-11-26 LAB — TSH: TSH: 1.66 u[IU]/mL (ref 0.35–5.50)

## 2020-11-26 LAB — HEPATIC FUNCTION PANEL
ALT: 56 U/L — ABNORMAL HIGH (ref 0–35)
AST: 42 U/L — ABNORMAL HIGH (ref 0–37)
Albumin: 4.6 g/dL (ref 3.5–5.2)
Alkaline Phosphatase: 110 U/L (ref 39–117)
Bilirubin, Direct: 0.1 mg/dL (ref 0.0–0.3)
Total Bilirubin: 0.4 mg/dL (ref 0.2–1.2)
Total Protein: 7.3 g/dL (ref 6.0–8.3)

## 2020-11-26 LAB — VITAMIN D 25 HYDROXY (VIT D DEFICIENCY, FRACTURES): VITD: 49.54 ng/mL (ref 30.00–100.00)

## 2020-11-26 NOTE — Progress Notes (Signed)
Subjective:    Patient ID: Dawn Hayes, female    DOB: [birth year], 50 y.o.   MRN: [id number]  HPI CPE- UTD on colonoscopy, due for repeat mammo (pt to schedule).  No need for pap due to hysterectomy.  UTD on Tdap, flu, COVID.  Due for prevnar and shingles.  Wants to hold on shingles due to work demands this week.  Reviewed past medical, surgical, family and social histories.   Patient Care Team    Relationship Specialty Notifications Start End  Midge Minium, MD PCP - General Family Medicine  10/09/15   Daneil Dolin, MD  Gastroenterology  01/26/12   Kem Boroughs, [place] Nurse Practitioner Family Medicine  12/04/15     Health Maintenance  Topic Date Due   Hepatitis C Screening  01/25/2021 (Originally 10/14/1988)   HIV Screening  01/25/2021 (Originally 10/14/1985)   Pneumococcal Vaccine 69-61 Years old (1 - PCV) 05/11/2021 (Originally 10/14/1976)   MAMMOGRAM  05/11/2021 (Originally 10/10/2020)   COVID-19 Vaccine (4 - Booster for Moderna series) 05/11/2021 (Originally 06/26/2020)   Zoster Vaccines- Shingrix (1 of 2) 05/11/2021 (Originally 10/14/1989)   INFLUENZA VACCINE  12/10/2020   TETANUS/TDAP  06/21/2021   COLONOSCOPY (Pts 45-1yrs Insurance coverage will need to be confirmed)  03/10/2027   HPV VACCINES  Aged Out      Review of Systems Patient reports no vision/ hearing changes, adenopathy,fever, weight change,  persistant/recurrent hoarseness , swallowing issues, chest pain, palpitations, edema, persistant/recurrent cough, hemoptysis, dyspnea (rest/exertional/paroxysmal nocturnal), gastrointestinal bleeding (melena, rectal bleeding), abdominal pain, significant heartburn, bowel changes, GU symptoms (dysuria, hematuria, incontinence), Gyn symptoms (abnormal  bleeding, pain),  syncope, focal weakness, memory loss, numbness & tingling, skin/hair/nail changes, abnormal bruising or bleeding, anxiety, or depression.   This visit occurred during the SARS-CoV-2 public health emergency.   Safety protocols were in place, including screening questions prior to the visit, additional usage of staff PPE, and extensive cleaning of exam room while observing appropriate contact time as indicated for disinfecting solutions.      Objective:   Physical Exam General Appearance:    Alert, cooperative, no distress, appears stated age  Head:    Normocephalic, without obvious abnormality, atraumatic  Eyes:    PERRL, conjunctiva/corneas clear, EOM's intact, fundi    benign, both eyes  Ears:    Normal TM's and external ear canals, both ears  Nose:   Deferred due to COVID  Throat:   Neck:   Supple, symmetrical, trachea midline, no adenopathy;    Thyroid: no enlargement/tenderness/nodules  Back:     Symmetric, no curvature, ROM normal, no CVA tenderness  Lungs:     Clear to auscultation bilaterally, respirations unlabored  Chest Wall:    No tenderness or deformity   Heart:    Regular rate and rhythm, S1 and S2 normal, no murmur, rub   or gallop  Breast Exam:    Deferred to GYN  Abdomen:     Soft, non-tender, bowel sounds active all four quadrants,    no masses, no organomegaly  Genitalia:    Deferred to GYN  Rectal:    Extremities:   Extremities normal, atraumatic, no cyanosis or edema  Pulses:   2+ and symmetric all extremities  Skin:   Skin color, texture, turgor normal, no rashes or lesions  Lymph nodes:   Cervical, supraclavicular, and axillary nodes normal  Neurologic:   CNII-XII intact, normal strength, sensation and reflexes    throughout  Assessment & Plan:

## 2020-11-26 NOTE — Assessment & Plan Note (Signed)
Pt's PE WNL.  UTD on colonoscopy.  Due for mammo- pt to schedule.  Since she is a long time smoker and has psoriatic arthritis will give Prevnar 20 today.  She will hold on the shingles until work is not as busy.  Check labs.  Anticipatory guidance provided.

## 2020-11-26 NOTE — Assessment & Plan Note (Signed)
Pt has hx of this.  Check labs and replete prn. 

## 2020-11-26 NOTE — Addendum Note (Signed)
Addended by: Genevie Cheshire L on: 11/26/2020 01:11 PM   Modules accepted: Orders

## 2020-11-26 NOTE — Assessment & Plan Note (Signed)
She has done a remarkable job of weight loss and kept it off.  BMI is now 26.45  Check labs to risk stratify and will continue to follow.

## 2020-11-26 NOTE — Patient Instructions (Signed)
Follow up in 6 months for a pain med check in [facility] notify you of your lab results and make any changes if needed Continue to work on healthy diet and regular exercise- you're doing great! Call and schedule your mammogram Call with any questions or concerns Stay Safe!  Stay Healthy! Have a great summer!!!

## 2020-11-27 LAB — HEPATITIS C ANTIBODY
Hepatitis C Ab: NONREACTIVE
SIGNAL TO CUT-OFF: 0.01 (ref <1.00)

## 2020-11-27 LAB — HIV ANTIBODY (ROUTINE TESTING W REFLEX): HIV 1&2 Ab, 4th Generation: NONREACTIVE

## 2020-12-05 ENCOUNTER — Telehealth: Payer: Self-pay

## 2020-12-05 ENCOUNTER — Encounter: Payer: Self-pay | Admitting: Family Medicine

## 2020-12-05 ENCOUNTER — Other Ambulatory Visit: Payer: Self-pay

## 2020-12-05 MED ORDER — OXYCODONE HCL 10 MG PO TABS: 10.0000 mg | ORAL_TABLET | Freq: Three times a day (TID) | ORAL | 0 refills | Status: AC | PRN

## 2020-12-05 NOTE — Telephone Encounter (Signed)
Good Morning. I am having another flare & I am afraid I caused this one myself. I did a lot of gardening & canning over the weekend & I think I pushed myself too far. I was hoping I could keep this under control with my ice & heat rotation. I have been taking 500 mg of tylenol as often as I can but it is doing nothing.  I either need a refill of the pain meds or I need to go the ER.  I am getting ready to head home because I cannot get comfortable @ work. I am in too much pain. Can I take an extra pain patch today? I am afraid to try it on my own w/o asking. I am just doing the two (300 mg) a day.  Please let me know.  Thank you Dawn Hayes [phone] ~ Cell   Oxycodone last filled 11/02/20 #20 with no refills LOV 11/26/20 NOV none Please advise.

## 2020-12-05 NOTE — Telephone Encounter (Signed)
Duplicate medication request. Has been sent to [place] for approval.

## 2020-12-05 NOTE — Telephone Encounter (Signed)
Pt is having a flair up and needs refill on Oxycodone HCl 10 MG TABS [id number] - [place], [place] - [address] [place] [address]   Pt call back [phone]

## 2020-12-06 ENCOUNTER — Other Ambulatory Visit: Payer: Self-pay

## 2020-12-06 ENCOUNTER — Ambulatory Visit (INDEPENDENT_AMBULATORY_CARE_PROVIDER_SITE_OTHER): Payer: 59

## 2020-12-06 DIAGNOSIS — M940 Chondrocostal junction syndrome [Tietze]: Secondary | ICD-10-CM

## 2020-12-06 MED ORDER — METHYLPREDNISOLONE ACETATE 80 MG/ML IJ SUSP
80.0000 mg | Freq: Once | INTRAMUSCULAR | Status: AC
  Administered 2020-12-06: 80 mg via INTRAMUSCULAR

## 2020-12-06 NOTE — Progress Notes (Signed)
Patient presents to the office to receive a methylprednisolone Acetate Injection of '80mg'$  for a costochondritis flare up per Dutch Quint. Patient received injection in her right glute and tolerated well. Patient will follow up with provider for further concerns.

## 2020-12-10 ENCOUNTER — Other Ambulatory Visit: Payer: Self-pay

## 2020-12-10 ENCOUNTER — Encounter: Payer: Self-pay | Admitting: Family Medicine

## 2020-12-10 MED ORDER — METHYLPHENIDATE HCL 20 MG PO TABS: 30.0000 mg | ORAL_TABLET | Freq: Two times a day (BID) | ORAL | 0 refills | Status: DC

## 2020-12-10 MED ORDER — BELBUCA 300 MCG BU FILM: 1.0000 | ORAL_FILM | Freq: Two times a day (BID) | BUCCAL | 0 refills | Status: DC

## 2020-12-10 NOTE — Telephone Encounter (Signed)
Dawn Hayes, [facility] Good morning. I am still having pain associated with this flare.  Can we do another prednisone shot?  Maybe increase the dosage?   I also need a refill this week for the Belbuca 300 mg. [facility] always has to order this for me so I have to call it in to them 5 days ahead. They have no refills on file for me so I will need that sent please. I usually ask every month for the refill.   I will also need the methylphenidate refilled this week.   LFD 11/09/20 #60 with no refills(Belbuca) LFD 11/16/20 #90 with no refills (ritalin) LOV 11/26/20 NOV none

## 2020-12-26 ENCOUNTER — Encounter: Payer: Self-pay | Admitting: Family Medicine

## 2020-12-26 ENCOUNTER — Telehealth: Payer: 59 | Admitting: Physician Assistant

## 2020-12-26 DIAGNOSIS — B9789 Other viral agents as the cause of diseases classified elsewhere: Secondary | ICD-10-CM | POA: Diagnosis not present

## 2020-12-26 DIAGNOSIS — J019 Acute sinusitis, unspecified: Secondary | ICD-10-CM | POA: Diagnosis not present

## 2020-12-26 MED ORDER — IPRATROPIUM BROMIDE 0.03 % NA SOLN: 2.0000 | Freq: Two times a day (BID) | NASAL | 0 refills | Status: DC

## 2020-12-26 NOTE — Progress Notes (Signed)
I have spent 5 minutes in review of e-visit questionnaire, review and updating patient chart, medical decision making and response to patient.   Trudi Morgenthaler Cody Kathryn Linarez, PA-C    

## 2020-12-26 NOTE — Progress Notes (Signed)
E-Visit for Sinus Problems  We are sorry that you are not feeling well.  Here is how we plan to help!  Based on what you have shared with me it looks like you have sinusitis.  Sinusitis is inflammation and infection in the sinus cavities of the head.  Based on your presentation I believe you most likely have Acute Viral Sinusitis.This is an infection most likely caused by a virus. There is not specific treatment for viral sinusitis other than to help you with the symptoms until the infection runs its course.  You may use an oral decongestant such as Mucinex D or if you have glaucoma or high blood pressure use plain Mucinex. Saline nasal spray help and can safely be used as often as needed for congestion, I have prescribed: Ipratropium Bromide nasal spray 0.03% 2 sprays in eah nostril 2-3 times a day  Some authorities believe that zinc sprays or the use of Echinacea may shorten the course of your symptoms.  Sinus infections are not as easily transmitted as other respiratory infection, however we still recommend that you avoid close contact with loved ones, especially the very young and elderly.  Remember to wash your hands thoroughly throughout the day as this is the number one way to prevent the spread of infection!  Regarding ADHD symptoms, you will need to reach out to Dr. Virgil Benedict office to see if she is still handling any medication refills in her absence from clinic or if the other providers in her office (Dr. Carlota Raspberry or Maximiano Coss, FNP) will be the ones filling for you in her absence.   Home Care: Only take medications as instructed by your medical team. Do not take these medications with alcohol. A steam or ultrasonic humidifier can help congestion.  You can place a towel over your head and breathe in the steam from hot water coming from a faucet. Avoid close contacts especially the very young and the elderly. Cover your mouth when you cough or sneeze. Always remember to wash your  hands.  Get Help Right Away If: You develop worsening fever or sinus pain. You develop a severe head ache or visual changes. Your symptoms persist after you have completed your treatment plan.  Make sure you Understand these instructions. Will watch your condition. Will get help right away if you are not doing well or get worse.   Thank you for choosing an e-visit.  Your e-visit answers were reviewed by a board certified advanced clinical practitioner to complete your personal care plan. Depending upon the condition, your plan could have included both over the counter or prescription medications.  Please review your pharmacy choice. Make sure the pharmacy is open so you can pick up prescription now. If there is a problem, you may contact your provider through [employer] and have the prescription routed to another pharmacy.  Your safety is important to [place]. If you have drug allergies check your prescription carefully.   For the next 24 hours you can use MyChart to ask questions about today's visit, request a non-urgent call back, or ask for a work or school excuse. You will get an email in the next two days asking about your experience. I hope that your e-visit has been valuable and will speed your recovery.

## 2020-12-27 NOTE — Telephone Encounter (Signed)
Please advise 

## 2021-01-07 ENCOUNTER — Encounter: Payer: Self-pay | Admitting: Family Medicine

## 2021-01-07 ENCOUNTER — Other Ambulatory Visit: Payer: Self-pay

## 2021-01-07 MED ORDER — METHYLPHENIDATE HCL 20 MG PO TABS: 30.0000 mg | ORAL_TABLET | Freq: Two times a day (BID) | ORAL | 0 refills | Status: DC

## 2021-01-07 NOTE — Telephone Encounter (Signed)
My refill is due this week for Methylphenidate. [facility] will need this refill sent in. Thank you! Dawn Hayes   LFD 12/10/20 #90 with no refills LOV 11/26/20 NOV none

## 2021-01-30 ENCOUNTER — Encounter: Payer: Self-pay | Admitting: Family Medicine

## 2021-01-31 ENCOUNTER — Other Ambulatory Visit: Payer: Self-pay

## 2021-01-31 MED ORDER — METHYLPHENIDATE HCL 20 MG PO TABS: 30.0000 mg | ORAL_TABLET | Freq: Two times a day (BID) | ORAL | 0 refills | Status: DC

## 2021-01-31 NOTE — Telephone Encounter (Signed)
Requesting:Ritalin 20mg  Contract: UDS: Last Visit:11/26/20 Next Visit:n/a Last Refill:01/07/21 90 tabs 0 refills  Please Advise

## 2021-02-13 ENCOUNTER — Other Ambulatory Visit: Payer: Self-pay | Admitting: Family Medicine

## 2021-02-15 ENCOUNTER — Ambulatory Visit: Payer: 59 | Admitting: Registered Nurse

## 2021-02-18 ENCOUNTER — Other Ambulatory Visit: Payer: Self-pay

## 2021-02-18 ENCOUNTER — Encounter: Payer: Self-pay | Admitting: Registered Nurse

## 2021-02-18 ENCOUNTER — Ambulatory Visit: Payer: 59 | Admitting: Registered Nurse

## 2021-02-18 VITALS — BP 109/83 | HR 69 | Temp 98.2°F | Resp 18 | Ht 61.0 in | Wt 144.0 lb

## 2021-02-18 DIAGNOSIS — Z23 Encounter for immunization: Secondary | ICD-10-CM | POA: Diagnosis not present

## 2021-02-18 DIAGNOSIS — F900 Attention-deficit hyperactivity disorder, predominantly inattentive type: Secondary | ICD-10-CM | POA: Diagnosis not present

## 2021-02-18 MED ORDER — METHYLPHENIDATE 30 MG/9HR TD PTCH: 1.0000 | MEDICATED_PATCH | Freq: Every day | TRANSDERMAL | 0 refills | Status: DC

## 2021-02-18 NOTE — Patient Instructions (Addendum)
[name] to meet you  Can change to daytrana patch for next fill  In interim, can change to 20mg  three times daily. See if that helps.  We can consider higher dosage or different medication in future  Check in with Dr. Birdie Riddle in about 6 weeks to check on effect.  Thanks,  Rich     If you have lab work done today you will be contacted with your lab results within the next 2 weeks.  If you have not heard from [place] then please contact us. The fastest way to get your results is to register for My Chart.   IF you received an x-ray today, you will receive an invoice from [facility]. Please contact [facility] at [phone] with questions or concerns regarding your invoice.   IF you received labwork today, you will receive an invoice from [facility]. Please contact [facility] at [phone] with questions or concerns regarding your invoice.   Our billing staff will not be able to assist you with questions regarding bills from these companies.  You will be contacted with the lab results as soon as they are available. The fastest way to get your results is to activate your My Chart account. Instructions are located on the last page of this paperwork. If you have not heard from [place] regarding the results in 2 weeks, please contact this office.

## 2021-02-18 NOTE — Progress Notes (Signed)
Established Patient Office Visit  Subjective:  Patient ID: Dawn Hayes, female    DOB: 02-Apr-1971  Age: 50 y.o. MRN: [id number]  CC:  Chief Complaint  Patient presents with   Medication Management    Patient states she is here to discuss her medication change and to get a flu shot.    HPI [name] presents for med management and flu shot  ADHD Has been on methylphenidate 30mg  po bid. No AE, but incomplete effect. Wears off too soon Cannot be on ER formulations due to her bariatric surgery. Interested in [place] patch.  Flu shot No AE in past Wants to get today.  Past Medical History:  Diagnosis Date   Anemia    Arthritis    psoriatic arthritis   Costochondritis    Diverticulitis    Dysmenorrhea    Fibromyalgia    GERD (gastroesophageal reflux disease)    Headache    Migraines   History of hiatal hernia    Hypertension    Menorrhagia    Psoriatic arthritis ([place])    Smoker     Past Surgical History:  Procedure Laterality Date   ABDOMINAL HYSTERECTOMY     CARPAL TUNNEL RELEASE Left 01/19/2015   Procedure: LEFT CARPAL TUNNEL RELEASE;  Surgeon: Roseanne Kaufman, MD;  Location: [place];  Service: Orthopedics;  Laterality: Left;   CHOLECYSTECTOMY     CHOLECYSTECTOMY, LAPAROSCOPIC  2006   COLONOSCOPY  1998   normal, [place]   COLONOSCOPY N/A 05/30/2015   RMR: Minimal anal canal hemorrhoids. colonic diverticulosis. I suspect relatively trivial recent gI Bleed likely related to hemorrhoids. This finding alone would not likely adequatly explain her degree of anemia. she likely has a significent component from menstrual losses. , etc.    COLONOSCOPY WITH PROPOFOL N/A 03/09/2017   Dr. Gala Romney: scattered diverticula, distal 5cm of TI normal. Grade II non-bleeding internal hemorrhoids. next TCS 10 years.    CYSTOSCOPY N/A 04/08/2016   Procedure: CYSTOSCOPY;  Surgeon: Nunzio Cobbs, MD;  Location: [facility];  Service: Gynecology;   Laterality: N/A;   DILATATION & CURETTAGE/HYSTEROSCOPY WITH MYOSURE N/A 01/04/2016   Procedure: DILATATION & CURETTAGE/HYSTEROSCOPY;  Surgeon: Nunzio Cobbs, MD;  Location: [facility];  Service: Gynecology;  Laterality: N/A;   ESOPHAGOGASTRODUODENOSCOPY  09/11/99   tiny esophageal erosions with mild erosive reflux/no barrett's/normal stomach   ESOPHAGOGASTRODUODENOSCOPY N/A 05/30/2015   RMR: Hiatal hernia otherwise negative EGD   GASTRIC ROUX-EN-Y N/A 01/19/2018   Procedure: LAPAROSCOPIC ROUX-EN-Y GASTRIC BYPASS WITH HIATAL HERNIA REPAIR, WITH UPPER ENDOSCOPY, ERAS Pathway;  Surgeon: Greer Pickerel, MD;  Location: [facility];  Service: General;  Laterality: N/A;   GIVENS CAPSULE STUDY N/A 08/14/2017   Procedure: GIVENS CAPSULE STUDY;  Surgeon: Daneil Dolin, MD;  [facility];  Service: Endoscopy;  Laterality: N/A;  7:30am   HYSTERECTOMY ABDOMINAL WITH SALPINGECTOMY Bilateral 04/08/2016   Procedure: HYSTERECTOMY ABDOMINAL WITH SALPINGECTOMY;  Surgeon: Nunzio Cobbs, MD;  Location: [facility];  Service: Gynecology;  Laterality: Bilateral;   LAPAROSCOPIC BILATERAL SALPINGECTOMY N/A 04/08/2016   Procedure: LAPAROSCOPIC BILATERAL SALPINGECTOMY possible BSO;  Surgeon: Nunzio Cobbs, MD;  Location: [facility];  Service: Gynecology;  Laterality: N/A;   LAPAROSCOPIC HYSTERECTOMY N/A 04/08/2016   Procedure: ATTEMPTED HYSTERECTOMY TOTAL LAPAROSCOPIC;  Surgeon: Nunzio Cobbs, MD;  Location: [facility] [facility];  Service: Gynecology;  Laterality: N/A;   LAPAROSCOPIC LYSIS OF ADHESIONS  04/08/2016   Procedure: LAPAROSCOPIC  LYSIS OF ADHESIONS;  Surgeon: Nunzio Cobbs, MD;  Location: [facility];  Service: Gynecology;;   LAPAROSCOPIC LYSIS OF ADHESIONS N/A 01/19/2018   Procedure: LAPAROSCOPIC LYSIS OF ADHESIONS;  Surgeon: Greer Pickerel, MD;  Location: [facility];  Service: General;  Laterality: N/A;    Family History  Problem Relation Age of Onset   Brain cancer Father    Prostate  cancer Father    Hyperlipidemia Mother    Colon cancer Maternal Grandmother 16   Stroke Paternal Grandfather        COD   Aneurysm Maternal Grandfather    Cancer Other    Hypertension Other    Other Paternal Uncle        gastric polyps    Social History   Socioeconomic History   Marital status: Married    Spouse name: Not on file   Number of children: 0   Years of education: Not on file   Highest education level: Not on file  Occupational History   Occupation: [employer], Sport and exercise psychologist  Tobacco Use   Smoking status: Light Smoker    Packs/day: 0.10    Years: 20.00    Pack years: 2.00    Types: Cigarettes   Smokeless tobacco: Never   Tobacco comments:    pt still smoking 1-2 cigs/day  Vaping Use   Vaping Use: Never used  Substance and Sexual Activity   Alcohol use: Yes    Alcohol/week: 16.0 standard drinks    Types: 6 Glasses of wine, 10 Standard drinks or equivalent per week    Comment: socially, weekends 4-5   Drug use: No   Sexual activity: Yes    Partners: Male    Birth control/protection: None  Other Topics Concern   Not on file  Social History Narrative   Not on file   Social Determinants of Health   Financial Resource Strain: Not on file  Food Insecurity: Not on file  Transportation Needs: Not on file  Physical Activity: Not on file  Stress: Not on file  Social Connections: Not on file  Intimate Partner Violence: Not on file    Outpatient Medications Prior to Visit  Medication Sig Dispense Refill   BELBUCA 300 MCG FILM Take 1 Film by mouth 2 (two) times daily. 60 each 0   calcipotriene (DOVONOX) 0.005 % ointment Apply topically at bedtime as needed.     DULoxetine (CYMBALTA) 60 MG capsule Take 1 capsule (60 mg total) by mouth daily. 90 capsule 1   folic acid (FOLVITE) 1 MG tablet Take 1 tablet by mouth daily.     gabapentin (NEURONTIN) 600 MG tablet TAKE 1 TO 3 TABLETS BY MOUTH EVERY NIGHT AT BEDTIME FOR RLS 90 tablet 3   ipratropium (ATROVENT)  0.03 % nasal spray Place 2 sprays into both nostrils every 12 (twelve) hours. 30 mL 0   methylphenidate (RITALIN) 20 MG tablet Take 1.5 tablets (30 mg total) by mouth 2 (two) times daily. 90 tablet 0   nystatin (MYCOSTATIN) 100000 UNIT/ML suspension TAKE 5MLS BY MOUTH 4 TIMES DAILY. SWISH AND SPIT. 473 mL 0   ondansetron (ZOFRAN) 4 MG tablet TAKE 1 TABLET(4 MG) BY MOUTH EVERY 8 HOURS AS NEEDED FOR NAUSEA OR VOMITING 10 tablet 2   OVER THE COUNTER MEDICATION Take 1 tablet by mouth daily. Bariatric Vitamin     Oxycodone HCl 10 MG TABS Take 1 tablet (10 mg total) by mouth 3 (three) times daily as needed. 20 tablet 0  varenicline (CHANTIX STARTING MONTH PAK) 0.5 MG X 11 & 1 MG X 42 tablet Take one 0.5 mg tablet by mouth once daily for 3 days, then increase to one 0.5 mg tablet twice daily for 4 days, then increase to one 1 mg tablet twice daily. 53 tablet 0   No facility-administered medications prior to visit.    Allergies  Allergen Reactions   Chloraprep One Step [Chlorhexidine Gluconate] Itching and Rash   Aspirin Other (See Comments)    Convulsions    Adhesive [Tape] Itching and Rash    ROS Review of Systems  Constitutional: Negative.   HENT: Negative.    Eyes: Negative.   Respiratory: Negative.    Cardiovascular: Negative.   Gastrointestinal: Negative.   Genitourinary: Negative.   Musculoskeletal: Negative.   Skin: Negative.   Neurological: Negative.   Psychiatric/Behavioral: Negative.    All other systems reviewed and are negative.    Objective:    Physical Exam Vitals and nursing note reviewed.  Constitutional:      General: She is not in acute distress.    Appearance: Normal appearance. She is normal weight. She is not ill-appearing, toxic-appearing or diaphoretic.  Cardiovascular:     Rate and Rhythm: Normal rate and regular rhythm.     Heart sounds: Normal heart sounds. No murmur heard.   No friction rub. No gallop.  Pulmonary:     Effort: Pulmonary effort is  normal. No respiratory distress.     Breath sounds: Normal breath sounds. No stridor. No wheezing, rhonchi or rales.  Chest:     Chest wall: No tenderness.  Skin:    General: Skin is warm and dry.  Neurological:     General: No focal deficit present.     Mental Status: She is alert and oriented to person, place, and time. Mental status is at baseline.  Psychiatric:        Mood and Affect: Mood normal.        Behavior: Behavior normal.        Thought Content: Thought content normal.        Judgment: Judgment normal.    BP 109/83   Pulse 69   Temp 98.2 F (36.8 C) (Temporal)   Resp 18   Ht 5\' 1"  (1.549 m)   Wt 144 lb (65.3 kg)   LMP 02/28/2016   SpO2 99%   BMI 27.21 kg/m  Wt Readings from Last 3 Encounters:  02/18/21 144 lb (65.3 kg)  11/26/20 140 lb (63.5 kg)  10/31/20 142 lb 9.6 oz (64.7 kg)     Health Maintenance Due  Topic Date Due   INFLUENZA VACCINE  12/10/2020    There are no preventive care reminders to display for this patient.  Lab Results  Component Value Date   TSH 1.66 11/26/2020   Lab Results  Component Value Date   WBC 5.6 11/26/2020   HGB 14.3 11/26/2020   HCT 41.8 11/26/2020   MCV 99.9 11/26/2020   PLT 238.0 11/26/2020   Lab Results  Component Value Date   NA 138 11/26/2020   K 4.1 11/26/2020   CO2 27 11/26/2020   GLUCOSE 75 11/26/2020   BUN 19 11/26/2020   CREATININE 0.57 11/26/2020   BILITOT 0.4 11/26/2020   ALKPHOS 110 11/26/2020   AST 42 (H) 11/26/2020   ALT 56 (H) 11/26/2020   PROT 7.3 11/26/2020   ALBUMIN 4.6 11/26/2020   CALCIUM 9.8 11/26/2020   ANIONGAP 12 12/06/2019   GFR 106.19 11/26/2020  Lab Results  Component Value Date   CHOL 234 (H) 11/26/2020   Lab Results  Component Value Date   HDL 105.30 11/26/2020   Lab Results  Component Value Date   LDLCALC 92 11/26/2020   Lab Results  Component Value Date   TRIG 184.0 (H) 11/26/2020   Lab Results  Component Value Date   CHOLHDL 2 11/26/2020   Lab Results   Component Value Date   HGBA1C 6.2 09/11/2017      Assessment & Plan:   Problem List Items Addressed This Visit       Other   ADHD (attention deficit hyperactivity disorder), inattentive type - Primary   Relevant Medications   methylphenidate (DAYTRANA) 30 MG/9HR (Start on 02/27/2021)   Other Visit Diagnoses     Flu vaccine need       Relevant Orders   Flu Vaccine QUAD 6+ mos PF IM (Fluarix Quad PF)       Meds ordered this encounter  Medications   methylphenidate (DAYTRANA) 30 MG/9HR    Sig: Place 1 patch onto the skin daily. wear patch for 9 hours only each day    Dispense:  30 patch    Refill:  0    Patient requesting to know if this is covered prior to her current rx running out    Order Specific Question:   Supervising Provider    Answer:   Carlota Raspberry, JEFFREY R [[id number]]    Follow-up: Return in about 6 weeks (around 04/01/2021) for Med check - daytrana.   PLAN Switch current rx to 20mg  po tid. Monitor effect Sending daytrana patches for next fill. Will plan on 30mg  dose. Can adjust if needed. Reviewed risk, benefit, AE of daytrana. Return in 6 weeks for med check with pcp Patient encouraged to call clinic with any questions, comments, or concerns.  Maximiano Coss, NP

## 2021-02-20 ENCOUNTER — Encounter: Payer: Self-pay | Admitting: Registered Nurse

## 2021-02-21 ENCOUNTER — Other Ambulatory Visit: Payer: Self-pay | Admitting: Family Medicine

## 2021-02-21 DIAGNOSIS — Z1231 Encounter for screening mammogram for malignant neoplasm of breast: Secondary | ICD-10-CM

## 2021-02-26 ENCOUNTER — Encounter: Payer: Self-pay | Admitting: Registered Nurse

## 2021-02-26 ENCOUNTER — Encounter: Payer: Self-pay | Admitting: Family Medicine

## 2021-02-28 ENCOUNTER — Other Ambulatory Visit: Payer: Self-pay | Admitting: Registered Nurse

## 2021-02-28 DIAGNOSIS — F900 Attention-deficit hyperactivity disorder, predominantly inattentive type: Secondary | ICD-10-CM

## 2021-02-28 MED ORDER — METHYLPHENIDATE 30 MG/9HR TD PTCH: 1.0000 | MEDICATED_PATCH | Freq: Every day | TRANSDERMAL | 0 refills | Status: DC

## 2021-02-28 NOTE — Telephone Encounter (Signed)
Unfortunately given that this is a controlled substance and she is already on the same medication in a different formulation, we don't have a lot of wiggle room for early fills.  Thanks,  Denice Paradise

## 2021-03-04 ENCOUNTER — Ambulatory Visit: Payer: 59 | Admitting: Family Medicine

## 2021-03-04 NOTE — Telephone Encounter (Signed)
This concern has been previously addressed by myself and/or another provider.  If they patient has ongoing concerns, they can contact me at their convenience.  Thank you,  Rich Marsia Cino, NP 

## 2021-03-07 ENCOUNTER — Ambulatory Visit: Payer: 59 | Admitting: Family Medicine

## 2021-03-11 ENCOUNTER — Encounter: Payer: Self-pay | Admitting: Family Medicine

## 2021-03-12 MED ORDER — ONDANSETRON 4 MG PO TBDP: 4.0000 mg | ORAL_TABLET | Freq: Three times a day (TID) | ORAL | 0 refills | Status: DC | PRN

## 2021-03-12 NOTE — Telephone Encounter (Signed)
Can you send in the sublingual instead?

## 2021-03-14 ENCOUNTER — Ambulatory Visit: Payer: 59 | Admitting: Family Medicine

## 2021-03-14 ENCOUNTER — Encounter: Payer: Self-pay | Admitting: Family Medicine

## 2021-03-14 ENCOUNTER — Other Ambulatory Visit: Payer: Self-pay

## 2021-03-14 VITALS — BP 118/80 | HR 69 | Temp 97.3°F | Resp 16 | Wt 144.4 lb

## 2021-03-14 DIAGNOSIS — J309 Allergic rhinitis, unspecified: Secondary | ICD-10-CM | POA: Diagnosis not present

## 2021-03-14 DIAGNOSIS — R519 Headache, unspecified: Secondary | ICD-10-CM

## 2021-03-14 DIAGNOSIS — R5383 Other fatigue: Secondary | ICD-10-CM | POA: Diagnosis not present

## 2021-03-14 LAB — CBC WITH DIFFERENTIAL/PLATELET
Basophils Absolute: 0.1 10*3/uL (ref 0.0–0.1)
Basophils Relative: 1.1 % (ref 0.0–3.0)
Eosinophils Absolute: 0.1 10*3/uL (ref 0.0–0.7)
Eosinophils Relative: 1.7 % (ref 0.0–5.0)
HCT: 43 % (ref 36.0–46.0)
Hemoglobin: 14.3 g/dL (ref 12.0–15.0)
Lymphocytes Relative: 36.1 % (ref 12.0–46.0)
Lymphs Abs: 2.1 10*3/uL (ref 0.7–4.0)
MCHC: 33.2 g/dL (ref 30.0–36.0)
MCV: 102.5 fl — ABNORMAL HIGH (ref 78.0–100.0)
Monocytes Absolute: 0.4 10*3/uL (ref 0.1–1.0)
Monocytes Relative: 6.6 % (ref 3.0–12.0)
Neutro Abs: 3.2 10*3/uL (ref 1.4–7.7)
Neutrophils Relative %: 54.5 % (ref 43.0–77.0)
Platelets: 185 10*3/uL (ref 150.0–400.0)
RBC: 4.19 Mil/uL (ref 3.87–5.11)
RDW: 12.2 % (ref 11.5–15.5)
WBC: 5.9 10*3/uL (ref 4.0–10.5)

## 2021-03-14 LAB — BASIC METABOLIC PANEL
BUN: 9 mg/dL (ref 6–23)
CO2: 30 mEq/L (ref 19–32)
Calcium: 9.5 mg/dL (ref 8.4–10.5)
Chloride: 101 mEq/L (ref 96–112)
Creatinine, Ser: 0.54 mg/dL (ref 0.40–1.20)
GFR: 107.36 mL/min (ref >60.00)
Glucose, Bld: 90 mg/dL (ref 70–99)
Potassium: 4.1 mEq/L (ref 3.5–5.1)
Sodium: 137 mEq/L (ref 135–145)

## 2021-03-14 LAB — B12 AND FOLATE PANEL
Folate: 17.9 ng/mL (ref >5.9)
Vitamin B-12: 316 pg/mL (ref 211–911)

## 2021-03-14 LAB — HEPATIC FUNCTION PANEL
ALT: 82 U/L — ABNORMAL HIGH (ref 0–35)
AST: 69 U/L — ABNORMAL HIGH (ref 0–37)
Albumin: 3.8 g/dL (ref 3.5–5.2)
Alkaline Phosphatase: 113 U/L (ref 39–117)
Bilirubin, Direct: 0.1 mg/dL (ref 0.0–0.3)
Total Bilirubin: 0.6 mg/dL (ref 0.2–1.2)
Total Protein: 6.9 g/dL (ref 6.0–8.3)

## 2021-03-14 LAB — VITAMIN D 25 HYDROXY (VIT D DEFICIENCY, FRACTURES): VITD: 37.61 ng/mL (ref 30.00–100.00)

## 2021-03-14 LAB — TSH: TSH: 1.49 u[IU]/mL (ref 0.35–5.50)

## 2021-03-14 NOTE — Patient Instructions (Addendum)
Follow up as needed or as scheduled We'll notify you of your lab results and make any changes if needed Restart daily allergy medication like Claritin or Zyrtec Add nasal spray like Flonase or Nasonex to help w/ frontal headaches Call with any questions or concerns Hang in there!!!

## 2021-03-14 NOTE — Progress Notes (Signed)
Subjective:    Patient ID: Dawn Hayes, female    DOB: Sep 21, 1970, 50 y.o.   MRN: [id number]  HPI Fatigue- ongoing issue for pt.  Pt reports sxs are worse this year than last year.  Has psoriatic arthritis, fibromyalgia.  Pt reports fatigue impacts her job.  'some days are really bad'.  Currently on Belbuca, Gabapentin, Oxycodone.  Pt does have difficulty sleeping due to RLS.  Rarely-if ever- does she sleep through the night.  No fevers/chills.  No change in appetite.  A lot of stress at work.  HAs- pt reports increased frequency.  HAs are frontal, 'all the way across my forehead'.  No TTP.  Denies photophobia or phonophobia.  No nausea.  Sxs became more noticeable 2-3 months ago.  Pt has hx of seasonal allergies but not taking antihistamine or nasal spray   Review of Systems For ROS see HPI   This visit occurred during the SARS-CoV-2 public health emergency.  Safety protocols were in place, including screening questions prior to the visit, additional usage of staff PPE, and extensive cleaning of exam room while observing appropriate contact time as indicated for disinfecting solutions.      Objective:   Physical Exam Vitals reviewed.  Constitutional:      General: She is not in acute distress.    Appearance: She is well-developed. She is not ill-appearing.  HENT:     Head: Normocephalic and atraumatic.  Eyes:     Conjunctiva/sclera: Conjunctivae normal.     Pupils: Pupils are equal, round, and reactive to light.  Neck:     Thyroid: No thyromegaly.  Cardiovascular:     Rate and Rhythm: Normal rate and regular rhythm.     Heart sounds: Normal heart sounds. No murmur heard. Pulmonary:     Effort: Pulmonary effort is normal. No respiratory distress.     Breath sounds: Normal breath sounds.  Abdominal:     General: There is no distension.     Palpations: Abdomen is soft.     Tenderness: There is no abdominal tenderness.  Musculoskeletal:     Cervical back: Normal range of  motion and neck supple.     Right lower leg: No edema.     Left lower leg: No edema.  Lymphadenopathy:     Cervical: No cervical adenopathy.  Skin:    General: Skin is warm and dry.  Neurological:     General: No focal deficit present.     Mental Status: She is alert and oriented to person, place, and time.  Psychiatric:        Mood and Affect: Mood normal.        Behavior: Behavior normal.          Assessment & Plan:   Fatigue- recurrent issue for pt.  Discussed that some of this may be medication related as she is on multiple sedating meds.  She reports meds haven't changed recently and she doesn't want to make adjustments if she doesn't have to.  Her other providers write those prescriptions so it would be best for her to discuss future med adjustments w/ them.  Will check labs to determine if there is an underlying metabolic cause.  Also encouraged her to discuss her increased fatigue w/ Rheum.  Will follow.  Frontal HA- deteriorated.  Are now occurring more frequently.  Sxs worsened 2-3 months ago which would correspond w/ seasonal allergy trigger.  She is not taking anything for her allergies and encouraged her to  restart nasal steroid and daily antihistamine.  Pt expressed understanding and is in agreement w/ plan.   Seasonal allergic rhinitis- deteriorated.  See above.  Will restart daily medication.

## 2021-03-15 ENCOUNTER — Other Ambulatory Visit: Payer: Self-pay

## 2021-03-15 DIAGNOSIS — R748 Abnormal levels of other serum enzymes: Secondary | ICD-10-CM

## 2021-03-18 ENCOUNTER — Encounter: Payer: Self-pay | Admitting: Family Medicine

## 2021-03-18 ENCOUNTER — Ambulatory Visit: Payer: 59 | Admitting: Family Medicine

## 2021-03-18 ENCOUNTER — Other Ambulatory Visit: Payer: Self-pay

## 2021-03-18 DIAGNOSIS — R748 Abnormal levels of other serum enzymes: Secondary | ICD-10-CM

## 2021-03-18 NOTE — Progress Notes (Signed)
Labs ordered previously

## 2021-03-21 ENCOUNTER — Ambulatory Visit: Payer: 59

## 2021-03-25 ENCOUNTER — Encounter: Payer: Self-pay | Admitting: Family Medicine

## 2021-03-25 DIAGNOSIS — F900 Attention-deficit hyperactivity disorder, predominantly inattentive type: Secondary | ICD-10-CM

## 2021-03-25 MED ORDER — METHYLPHENIDATE 30 MG/9HR TD PTCH: 1.0000 | MEDICATED_PATCH | Freq: Every day | TRANSDERMAL | 0 refills | Status: DC

## 2021-03-28 ENCOUNTER — Ambulatory Visit
Admission: RE | Admit: 2021-03-28 | Discharge: 2021-03-28 | Disposition: A | Discharge status: Home / Self Care | Payer: 59 | Source: Ambulatory Visit | Attending: Family Medicine | Admitting: Family Medicine

## 2021-03-28 DIAGNOSIS — Z1231 Encounter for screening mammogram for malignant neoplasm of breast: Secondary | ICD-10-CM

## 2021-04-01 ENCOUNTER — Encounter: Payer: Self-pay | Admitting: Family Medicine

## 2021-04-01 ENCOUNTER — Ambulatory Visit: Payer: 59 | Admitting: Family Medicine

## 2021-04-01 ENCOUNTER — Other Ambulatory Visit: Payer: 59

## 2021-04-16 ENCOUNTER — Other Ambulatory Visit: Payer: Self-pay | Admitting: Family Medicine

## 2021-04-16 DIAGNOSIS — F900 Attention-deficit hyperactivity disorder, predominantly inattentive type: Secondary | ICD-10-CM

## 2021-04-17 MED ORDER — METHYLPHENIDATE 30 MG/9HR TD PTCH
1.0000 | MEDICATED_PATCH | Freq: Every day | TRANSDERMAL | 0 refills | Status: DC
Start: 2021-04-17 — End: 2021-05-20

## 2021-04-29 ENCOUNTER — Encounter: Payer: Self-pay | Admitting: Nurse Practitioner

## 2021-04-29 ENCOUNTER — Telehealth (INDEPENDENT_AMBULATORY_CARE_PROVIDER_SITE_OTHER): Payer: 59 | Admitting: Nurse Practitioner

## 2021-04-29 VITALS — Temp 101.0°F | Ht 61.5 in | Wt 140.0 lb

## 2021-04-29 DIAGNOSIS — U071 COVID-19: Secondary | ICD-10-CM

## 2021-04-29 NOTE — Progress Notes (Signed)
Virtual Visit via Video Note  I connected withNAME@ on 04/30/21 at 11:30 AM EST by a video enabled telemedicine application and verified that I am speaking with the correct person using two identifiers.  Location: Patient:Home Provider: Office Participants: patient and provider  I discussed the limitations of evaluation and management by telemedicine and the availability of in person appointments. I also discussed with the patient that there may be a patient responsible charge related to this service. The patient expressed understanding and agreed to proceed.  CC:Pt c/o sore throat, fever, and chills x 2 days. Pt states she has appointments to be tested for COVID and the flu today. Pt has took otc medications with no relief.   History of Present Illness: [name] presents with fever, malaise, and hoarseness x 2-3days. She denies any cough, chest pain, SOB, night sweats, GI symptoms or rash. She has several several co workers with unspecified upper respiratory illness. Last contact on Friday. She has an appt today 3pm at local [facility] for rapid COVID and flu test. Took tylenol 1000mg  this morning with minimal improvement. She needs to limit tylenol use due to abnormal liver function. Influenza vaccine 02/2021 3 doses of [place] vaccine, last dose 1 week ago   Observations/Objective: Physical Exam Vitals reviewed.  Constitutional:      General: She is not in acute distress. Pulmonary:     Effort: Pulmonary effort is normal.  Neurological:     Mental Status: She is alert and oriented to person, place, and time.  Psychiatric:        Mood and Affect: Mood normal.        Behavior: Behavior normal.        Thought Content: Thought content normal.     Assessment and Plan: Dawn Hayes was seen today for acute visit.  Diagnoses and all orders for this visit:  COVID-19 -     nirmatrelvir/ritonavir EUA (PAXLOVID) 20 x 150 MG & 10 x 100MG  TABS; Take 3 tablets by mouth 2 (two) times daily  for 5 days. (Take nirmatrelvir 150 mg two tablets twice daily for 5 days and ritonavir 100 mg one tablet twice daily for 5 days) Patient GFR is 107   Follow Up Instructions: Symptom management recommended while waiting for test results Positive COVID test results per mychart message Take ibuprofen 400mg  every 6hrs as needed for fever and malaise Maintain adequate oral hydration.   I discussed the assessment and treatment plan with the patient. The patient was provided an opportunity to ask questions and all were answered. The patient agreed with the plan and demonstrated an understanding of the instructions.   The patient was advised to call back or seek an in-person evaluation if the symptoms worsen or if the condition fails to improve as anticipated.  Wilfred Lacy, NP

## 2021-04-29 NOTE — Patient Instructions (Addendum)
Call office with COVID and flu test results. If both are negative, repeat rapid COVID test in 2days. Take ibuprofen 400mg  every 6hrs as needed for fever and malaise Maintain adequate oral hydration.  Upper Respiratory Infection, Adult An upper respiratory infection (URI) affects the nose, throat, and upper airways that lead to the lungs. The most common type of URI is often called the common cold. URIs usually get better on their own, without medical treatment. What are the causes? A URI is caused by a germ (virus). You may catch these germs by: Breathing in droplets from an infected person's cough or sneeze. Touching something that has the germ on it (is contaminated) and then touching your mouth, nose, or eyes. What increases the risk? You are more likely to get a URI if: You are very young or very old. You have close contact with others, such as at work, school, or a health care facility. You smoke. You have long-term (chronic) heart or lung disease. You have a weakened disease-fighting system (immune system). You have nasal allergies or asthma. You have a lot of stress. You have poor nutrition. What are the signs or symptoms? Runny or stuffy (congested) nose. Cough. Sneezing. Sore throat. Headache. Feeling tired (fatigue). Fever. Not wanting to eat as much as usual. Pain in your forehead, behind your eyes, and over your cheekbones (sinus pain). Muscle aches. Redness or irritation of the eyes. Pressure in the ears or face. How is this treated? URIs usually get better on their own within 7-10 days. Medicines cannot cure URIs, but your doctor may recommend certain medicines to help relieve symptoms, such as: Over-the-counter cold medicines. Medicines to reduce coughing (cough suppressants). Coughing is a type of defense against infection that helps to clear the nose, throat, windpipe, and lungs (respiratory system). Take these medicines only as told by your doctor. Medicines to  lower your fever. Follow these instructions at home: Activity Rest as needed. If you have a fever, stay home from work or school until your fever is gone, or until your doctor says you may return to work or school. You should stay home until you cannot spread the infection anymore (you are not contagious). Your doctor may have you wear a face mask so you have less risk of spreading the infection. Relieving symptoms Rinse your mouth often with salt water. To make salt water, dissolve -1 tsp (3-6 g) of salt in 1 cup (237 mL) of warm water. Use a cool-mist humidifier to add moisture to the air. This can help you breathe more easily. Eating and drinking  Drink enough fluid to keep your pee (urine) pale yellow. Eat soups and other clear broths. General instructions  Take over-the-counter and prescription medicines only as told by your doctor. Do not smoke or use any products that contain nicotine or tobacco. If you need help quitting, ask your doctor. Avoid being where people are smoking (avoid secondhand smoke). Stay up to date on all your shots (immunizations), and get the flu shot every year. Keep all follow-up visits. How to prevent the spread of infection to others  Wash your hands with soap and water for at least 20 seconds. If you cannot use soap and water, use hand sanitizer. Avoid touching your mouth, face, eyes, or nose. Cough or sneeze into a tissue or your sleeve or elbow. Do not cough or sneeze into your hand or into the air. Contact a doctor if: You are getting worse, not better. You have any of these: A  fever or chills. Brown or red mucus in your nose. Yellow or brown fluid (discharge)coming from your nose. Pain in your face, especially when you bend forward. Swollen neck glands. Pain when you swallow. White areas in the back of your throat. Get help right away if: You have shortness of breath that gets worse. You have very bad or constant: Headache. Ear pain. Pain  in your forehead, behind your eyes, and over your cheekbones (sinus pain). Chest pain. You have long-lasting (chronic) lung disease along with any of these: Making high-pitched whistling sounds when you breathe, most often when you breathe out (wheezing). Long-lasting cough (more than 14 days). Coughing up blood. A change in your usual mucus. You have a stiff neck. You have changes in your: Vision. Hearing. Thinking. Mood. These symptoms may be an emergency. Get help right away. Call 911. Do not wait to see if the symptoms will go away. Do not drive yourself to the hospital. Summary An upper respiratory infection (URI) is caused by a germ (virus). The most common type of URI is often called the common cold. URIs usually get better within 7-10 days. Take over-the-counter and prescription medicines only as told by your doctor. This information is not intended to replace advice given to you by your health care provider. Make sure you discuss any questions you have with your health care provider. Document Revised: 11/28/2020 Document Reviewed: 11/28/2020 Elsevier Patient Education  .

## 2021-04-30 ENCOUNTER — Encounter: Payer: Self-pay | Admitting: Family Medicine

## 2021-04-30 MED ORDER — NIRMATRELVIR/RITONAVIR (PAXLOVID)TABLET: 3.0000 | ORAL_TABLET | Freq: Two times a day (BID) | ORAL | 0 refills | Status: AC

## 2021-04-30 NOTE — Telephone Encounter (Signed)
Pt requesting the anti viral covid Rx asap.

## 2021-04-30 NOTE — Telephone Encounter (Signed)
Pt called in asking for an update on this, please advise she wants to start the medication asap. She did a Video visit yesterday with [employer]   Pt uses [facility] in [place].

## 2021-05-07 ENCOUNTER — Encounter: Payer: Self-pay | Admitting: Family Medicine

## 2021-05-08 ENCOUNTER — Other Ambulatory Visit: Payer: Self-pay

## 2021-05-08 ENCOUNTER — Ambulatory Visit: Payer: 59 | Admitting: Registered Nurse

## 2021-05-08 ENCOUNTER — Emergency Department (HOSPITAL_BASED_OUTPATIENT_CLINIC_OR_DEPARTMENT_OTHER): Payer: 59

## 2021-05-08 ENCOUNTER — Encounter (HOSPITAL_BASED_OUTPATIENT_CLINIC_OR_DEPARTMENT_OTHER): Payer: Self-pay | Admitting: Emergency Medicine

## 2021-05-08 ENCOUNTER — Ambulatory Visit: Payer: 59 | Admitting: Family Medicine

## 2021-05-08 ENCOUNTER — Emergency Department (HOSPITAL_BASED_OUTPATIENT_CLINIC_OR_DEPARTMENT_OTHER)
Admission: EM | Admit: 2021-05-08 | Discharge: 2021-05-08 | Disposition: A | Discharge status: Home / Self Care | Payer: 59 | Attending: Emergency Medicine | Admitting: Emergency Medicine

## 2021-05-08 DIAGNOSIS — F17210 Nicotine dependence, cigarettes, uncomplicated: Secondary | ICD-10-CM | POA: Diagnosis not present

## 2021-05-08 DIAGNOSIS — I10 Essential (primary) hypertension: Secondary | ICD-10-CM | POA: Diagnosis not present

## 2021-05-08 DIAGNOSIS — R0602 Shortness of breath: Secondary | ICD-10-CM

## 2021-05-08 DIAGNOSIS — U071 COVID-19: Secondary | ICD-10-CM | POA: Insufficient documentation

## 2021-05-08 LAB — CBC WITH DIFFERENTIAL/PLATELET
Abs Immature Granulocytes: 0 10*3/uL (ref 0.00–0.07)
Basophils Absolute: 0 10*3/uL (ref 0.0–0.1)
Basophils Relative: 0 %
Eosinophils Absolute: 0.1 10*3/uL (ref 0.0–0.5)
Eosinophils Relative: 2 %
HCT: 45.3 % (ref 36.0–46.0)
Hemoglobin: 15.6 g/dL — ABNORMAL HIGH (ref 12.0–15.0)
Immature Granulocytes: 0 %
Lymphocytes Relative: 48 %
Lymphs Abs: 2.1 10*3/uL (ref 0.7–4.0)
MCH: 33 pg (ref 26.0–34.0)
MCHC: 34.4 g/dL (ref 30.0–36.0)
MCV: 95.8 fL (ref 80.0–100.0)
Monocytes Absolute: 0.4 10*3/uL (ref 0.1–1.0)
Monocytes Relative: 9 %
Neutro Abs: 1.8 10*3/uL (ref 1.7–7.7)
Neutrophils Relative %: 41 %
Platelets: 208 10*3/uL (ref 150–400)
RBC: 4.73 MIL/uL (ref 3.87–5.11)
RDW: 11.7 % (ref 11.5–15.5)
WBC: 4.5 10*3/uL (ref 4.0–10.5)
nRBC: 0 % (ref 0.0–0.2)

## 2021-05-08 LAB — BASIC METABOLIC PANEL
Anion gap: 8 (ref 5–15)
BUN: 9 mg/dL (ref 6–20)
CO2: 25 mmol/L (ref 22–32)
Calcium: 9.7 mg/dL (ref 8.9–10.3)
Chloride: 104 mmol/L (ref 98–111)
Creatinine, Ser: 0.6 mg/dL (ref 0.44–1.00)
GFR, Estimated: >60 mL/min (ref >60)
Glucose, Bld: 91 mg/dL (ref 70–99)
Potassium: 4 mmol/L (ref 3.5–5.1)
Sodium: 137 mmol/L (ref 135–145)

## 2021-05-08 LAB — TROPONIN I (HIGH SENSITIVITY): Troponin I (High Sensitivity): <2 ng/L (ref <18)

## 2021-05-08 LAB — D-DIMER, QUANTITATIVE: D-Dimer, Quant: 0.29 ug/mL-FEU (ref 0.00–0.50)

## 2021-05-08 MED ORDER — IPRATROPIUM-ALBUTEROL 0.5-2.5 (3) MG/3ML IN SOLN
3.0000 mL | Freq: Once | RESPIRATORY_TRACT | Status: AC
  Administered 2021-05-08: 16:00:00 3 mL via RESPIRATORY_TRACT
  Filled 2021-05-08: qty 3

## 2021-05-08 MED ORDER — PREDNISONE 50 MG PO TABS: 50.0000 mg | ORAL_TABLET | Freq: Every day | ORAL | 0 refills | Status: DC

## 2021-05-08 MED ORDER — BENZONATATE 100 MG PO CAPS: 100.0000 mg | ORAL_CAPSULE | Freq: Three times a day (TID) | ORAL | 0 refills | Status: DC

## 2021-05-08 MED ORDER — ALBUTEROL SULFATE HFA 108 (90 BASE) MCG/ACT IN AERS: 1.0000 | INHALATION_SPRAY | Freq: Four times a day (QID) | RESPIRATORY_TRACT | 0 refills | Status: DC | PRN

## 2021-05-08 MED ORDER — MORPHINE SULFATE (PF) 4 MG/ML IV SOLN
4.0000 mg | Freq: Once | INTRAVENOUS | Status: AC
  Administered 2021-05-08: 16:00:00 4 mg via INTRAVENOUS
  Filled 2021-05-08: qty 1

## 2021-05-08 MED ORDER — METHYLPREDNISOLONE SODIUM SUCC 125 MG IJ SOLR
125.0000 mg | Freq: Once | INTRAMUSCULAR | Status: AC
  Administered 2021-05-08: 14:00:00 125 mg via INTRAVENOUS
  Filled 2021-05-08: qty 2

## 2021-05-08 MED ORDER — IPRATROPIUM-ALBUTEROL 0.5-2.5 (3) MG/3ML IN SOLN
3.0000 mL | Freq: Once | RESPIRATORY_TRACT | Status: AC
  Administered 2021-05-08: 14:00:00 3 mL via RESPIRATORY_TRACT
  Filled 2021-05-08: qty 3

## 2021-05-08 MED ORDER — AZITHROMYCIN 250 MG PO TABS: 250.0000 mg | ORAL_TABLET | Freq: Every day | ORAL | 0 refills | Status: DC

## 2021-05-08 NOTE — ED Provider Notes (Signed)
[facility] EMERGENCY DEPT Provider Note   CSN: [id number] Arrival date & time: 05/08/21  1237     History Chief Complaint  Patient presents with   Shortness of Breath    Dawn Hayes is a 50 y.o. female with past medical history significant for tobacco use, fibromyalgia, anemia, hypertension who presents for evaluation of shortness of breath.  Diagnosed with COVID on 12/19.  Has associated cough and wheeze.  Has some chest tightness, states she has history of chronic costochondritis however this does not feel similar.  No back pain.  No lower extremity pain, swelling.  No history of PE or DVT.  No prior history of asthma, COPD however does use tobacco.  Had some persistent nausea since diagnosis.  No headache, fever, abdominal pain, diarrhea or dysuria.  States she is coughing up green sputum.  Denies additional aggravating or alleviating factors.  History obtained from patient and past medical records.  No interpreter used.  HPI     Past Medical History:  Diagnosis Date   Anemia    Arthritis    psoriatic arthritis   Costochondritis    Diverticulitis    Dysmenorrhea    Fibromyalgia    GERD (gastroesophageal reflux disease)    Headache    Migraines   History of hiatal hernia    Hypertension    Menorrhagia    Psoriatic arthritis ([place])    Smoker     Patient Active Problem List   Diagnosis Date Noted   Physical exam 11/26/2020   Overweight (BMI 25.0-29.9) 11/26/2020   ADHD (attention deficit hyperactivity disorder), inattentive type 09/27/2020   Cigarette smoker 09/27/2020   Carpal tunnel syndrome, right 06/10/2019   Bariatric surgery status 01/26/2018   RLS (restless legs syndrome) 01/26/2018   Vitamin D deficiency 11/10/2017   Fibromyalgia 11/10/2017   Anxiety and depression 05/27/2016   Status post total abdominal hysterectomy 04/08/2016   Costochondritis 12/04/2015   Anemia, iron deficiency 10/10/2015   Tick bites 10/10/2015   Allergic  rhinitis 09/07/2015   Hemorrhoid    Diverticulosis of colon without hemorrhage    Hiatal hernia    Rectal bleeding 05/08/2015   Abnormal finding on EKG 09/16/2013   Psoriatic arthritis ([place]) 09/16/2013   GERD 12/11/2009   DIARRHEA, CHRONIC 12/11/2009    Past Surgical History:  Procedure Laterality Date   ABDOMINAL HYSTERECTOMY     CARPAL TUNNEL RELEASE Left 01/19/2015   Procedure: LEFT CARPAL TUNNEL RELEASE;  Surgeon: Roseanne Kaufman, MD;  Location: [facility];  Service: Orthopedics;  Laterality: Left;   CHOLECYSTECTOMY     CHOLECYSTECTOMY, LAPAROSCOPIC  2006   COLONOSCOPY  1998   normal,    COLONOSCOPY N/A 05/30/2015   RMR: Minimal anal canal hemorrhoids. colonic diverticulosis. I suspect relatively trivial recent gI Bleed likely related to hemorrhoids. This finding alone would not likely adequatly explain her degree of anemia. she likely has a significent component from menstrual losses. , etc.    COLONOSCOPY WITH PROPOFOL N/A 03/09/2017   Dr. Gala Romney: scattered diverticula, distal 5cm of TI normal. Grade II non-bleeding internal hemorrhoids. next TCS 10 years.    CYSTOSCOPY N/A 04/08/2016   Procedure: CYSTOSCOPY;  Surgeon: Nunzio Cobbs, MD;  Location: [facility];  Service: Gynecology;  Laterality: N/A;   DILATATION & CURETTAGE/HYSTEROSCOPY WITH MYOSURE N/A 01/04/2016   Procedure: DILATATION & CURETTAGE/HYSTEROSCOPY;  Surgeon: Nunzio Cobbs, MD;  Location: [facility];  Service: Gynecology;  Laterality: N/A;   ESOPHAGOGASTRODUODENOSCOPY  09/11/99  tiny esophageal erosions with mild erosive reflux/no barrett's/normal stomach   ESOPHAGOGASTRODUODENOSCOPY N/A 05/30/2015   RMR: Hiatal hernia otherwise negative EGD   GASTRIC ROUX-EN-Y N/A 01/19/2018   Procedure: LAPAROSCOPIC ROUX-EN-Y GASTRIC BYPASS WITH HIATAL HERNIA REPAIR, WITH UPPER ENDOSCOPY, ERAS Pathway;  Surgeon: Greer Pickerel, MD;  Location: [facility];  Service: General;  Laterality: N/A;   GIVENS  CAPSULE STUDY N/A 08/14/2017   Procedure: GIVENS CAPSULE STUDY;  Surgeon: Daneil Dolin, MD;  [facility];  Service: Endoscopy;  Laterality: N/A;  7:30am   HYSTERECTOMY ABDOMINAL WITH SALPINGECTOMY Bilateral 04/08/2016   Procedure: HYSTERECTOMY ABDOMINAL WITH SALPINGECTOMY;  Surgeon: Nunzio Cobbs, MD;  Location: [facility];  Service: Gynecology;  Laterality: Bilateral;   LAPAROSCOPIC BILATERAL SALPINGECTOMY N/A 04/08/2016   Procedure: LAPAROSCOPIC BILATERAL SALPINGECTOMY possible BSO;  Surgeon: Nunzio Cobbs, MD;  Location: [facility];  Service: Gynecology;  Laterality: N/A;   LAPAROSCOPIC HYSTERECTOMY N/A 04/08/2016   Procedure: ATTEMPTED HYSTERECTOMY TOTAL LAPAROSCOPIC;  Surgeon: Nunzio Cobbs, MD;  Location: [facility];  Service: Gynecology;  Laterality: N/A;   LAPAROSCOPIC LYSIS OF ADHESIONS  04/08/2016   Procedure: LAPAROSCOPIC LYSIS OF ADHESIONS;  Surgeon: Nunzio Cobbs, MD;  Location: [facility];  Service: Gynecology;;   LAPAROSCOPIC LYSIS OF ADHESIONS N/A 01/19/2018   Procedure: LAPAROSCOPIC LYSIS OF ADHESIONS;  Surgeon: Greer Pickerel, MD;  Location: [facility];  Service: General;  Laterality: N/A;     OB History     Gravida  0   Para      Term      Preterm      AB      Living         SAB      IAB      Ectopic      Multiple      Live Births              Family History  Problem Relation Age of Onset   Brain cancer Father    Prostate cancer Father    Hyperlipidemia Mother    Colon cancer Maternal Grandmother 72   Stroke Paternal Grandfather        COD   Aneurysm Maternal Grandfather    Cancer Other    Hypertension Other    Other Paternal Uncle        gastric polyps    Social History   Tobacco Use   Smoking status: Light Smoker    Packs/day: 0.10    Years: 20.00    Pack years: 2.00    Types: Cigarettes   Smokeless tobacco: Never   Tobacco comments:    pt still smoking 1-2 cigs/day  Vaping Use   Vaping Use:  Never used  Substance Use Topics   Alcohol use: Yes    Alcohol/week: 16.0 standard drinks    Types: 6 Glasses of wine, 10 Standard drinks or equivalent per week    Comment: socially, weekends 4-5   Drug use: No    Home Medications Prior to Admission medications   Medication Sig Start Date End Date Taking? Authorizing Provider  albuterol (VENTOLIN HFA) 108 (90 Base) MCG/ACT inhaler Inhale 1-2 puffs into the lungs every 6 (six) hours as needed for wheezing or shortness of breath. 05/08/21  Yes Adarryl Goldammer A, PA-C  azithromycin (ZITHROMAX) 250 MG tablet Take 1 tablet (250 mg total) by mouth daily. Take first 2 tablets together, then 1 every day until finished. 05/08/21  Yes  Louvina Cleary A, PA-C  benzonatate (TESSALON) 100 MG capsule Take 1 capsule (100 mg total) by mouth every 8 (eight) hours. 05/08/21  Yes Levie Owensby A, PA-C  predniSONE (DELTASONE) 50 MG tablet Take 1 tablet (50 mg total) by mouth daily. 05/08/21  Yes Nolah Krenzer A, PA-C  BELBUCA 450 MCG FILM Take by mouth 2 (two) times daily. 02/15/21   [provider]  cycloSPORINE (RESTASIS) 0.05 % ophthalmic emulsion Apply to eye. 06/22/15   [provider]  DULoxetine HCl 40 MG CPEP Take 1 capsule by mouth daily. 02/20/21   [provider]  folic acid (FOLVITE) 1 MG tablet Take 1 tablet by mouth daily. 07/07/18   [provider]  gabapentin (NEURONTIN) 300 MG capsule Take 300 mg by mouth 3 (three) times daily.    [provider]  inFLIXimab (REMICADE IV) Inject into the vein every 8 (eight) weeks.    [provider]  methylphenidate (DAYTRANA) 30 MG/9HR Place 1 patch onto the skin daily. wear patch for 9 hours only each day 04/17/21   Midge Minium, MD  nystatin (MYCOSTATIN) 100000 UNIT/ML suspension TAKE 5MLS BY MOUTH 4 TIMES DAILY. SWISH AND SPIT. 02/13/21   Maximiano Coss, NP  ondansetron (ZOFRAN ODT) 4 MG disintegrating tablet Take 1 tablet (4 mg total) by  mouth every 8 (eight) hours as needed for nausea or vomiting. 03/12/21   Midge Minium, MD  OVER THE COUNTER MEDICATION Take 1 tablet by mouth daily. Bariatric Vitamin    [provider]  Oxycodone HCl 10 MG TABS Take 1 tablet (10 mg total) by mouth 3 (three) times daily as needed. 12/05/20   Kennyth Arnold, FNP  varenicline (CHANTIX STARTING MONTH PAK) 0.5 MG X 11 & 1 MG X 42 tablet Take one 0.5 mg tablet by mouth once daily for 3 days, then increase to one 0.5 mg tablet twice daily for 4 days, then increase to one 1 mg tablet twice daily. 09/27/20   Midge Minium, MD    Allergies    Chloraprep one step [chlorhexidine gluconate], Aspirin, Diclofenac sodium, Golimumab, and Adhesive [tape]  Review of Systems   Review of Systems  Constitutional:  Positive for activity change, appetite change and fatigue.  HENT:  Positive for congestion and rhinorrhea. Negative for sore throat.   Respiratory:  Positive for cough, shortness of breath and wheezing. Negative for apnea, choking, chest tightness and stridor.   Cardiovascular:  Positive for chest pain. Negative for palpitations and leg swelling.  Gastrointestinal:  Positive for nausea. Negative for abdominal distention, abdominal pain, anal bleeding, blood in stool, constipation, diarrhea, rectal pain and vomiting.  Genitourinary: Negative.   Musculoskeletal:  Positive for myalgias.  Skin: Negative.   Neurological:  Positive for weakness (generalized). Negative for dizziness, seizures, facial asymmetry, speech difficulty, light-headedness, numbness and headaches.  All other systems reviewed and are negative.  Physical Exam Updated Vital Signs BP (!) 126/92    Pulse 78    Temp 98 F (36.7 C) (Oral)    Resp 20    LMP 02/28/2016    SpO2 98%   Physical Exam Vitals and nursing note reviewed.  Constitutional:      General: She is not in acute distress.    Appearance: She is well-developed. She is not ill-appearing, toxic-appearing  or diaphoretic.  HENT:     Head: Normocephalic and atraumatic.     Mouth/Throat:     Mouth: Mucous membranes are moist.  Eyes:  Pupils: Pupils are equal, round, and reactive to light.  Cardiovascular:     Rate and Rhythm: Normal rate.     Pulses: Normal pulses.          Radial pulses are 2+ on the right side and 2+ on the left side.       Dorsalis pedis pulses are 2+ on the right side and 2+ on the left side.     Heart sounds: Normal heart sounds.  Pulmonary:     Effort: No respiratory distress.     Breath sounds: Wheezing present.     Comments: Coarse lung sounds throughout, expiratory wheeze Chest:     Comments: Mild tenderness diffuse anterior chest wall Abdominal:     General: Bowel sounds are normal. There is no distension.     Palpations: Abdomen is soft. There is no hepatomegaly or mass.     Tenderness: There is no abdominal tenderness. There is no guarding or rebound.     Comments: Soft, nontender  Musculoskeletal:        General: Normal range of motion.     Cervical back: Normal range of motion.     Right lower leg: No tenderness. No edema.     Left lower leg: No tenderness. No edema.     Comments: No bony tenderness, full range of motion, compartment soft.  Nontender posterior calves bilaterally  Feet:     Comments: No LE edema bl Skin:    General: Skin is warm and dry.     Capillary Refill: Capillary refill takes less than 2 seconds.     Comments: No rash or lesions.  Neurological:     General: No focal deficit present.     Mental Status: She is alert and oriented to person, place, and time.  Psychiatric:        Mood and Affect: Mood normal.    ED Results / Procedures / Treatments   Labs (all labs ordered are listed, but only abnormal results are displayed) Labs Reviewed  CBC WITH DIFFERENTIAL/PLATELET - Abnormal; Notable for the following components:      Result Value   Hemoglobin 15.6 (*)    All other components within normal limits  BASIC METABOLIC  PANEL  D-DIMER, QUANTITATIVE  TROPONIN I (HIGH SENSITIVITY)    EKG EKG Interpretation  Date/Time:  Wednesday May 08 2021 13:26:45 EST Ventricular Rate:  67 PR Interval:  156 QRS Duration: 92 QT Interval:  411 QTC Calculation: 434 R Axis:   -30 Text Interpretation: Sinus rhythm Left axis deviation Low voltage, precordial leads Poor R wave progression No significant change since last tracing except decrease in rate Confirmed by Pattricia Boss [phone]) on 05/08/2021 2:32:45 PM  Radiology DG Chest Portable 1 View  Result Date: 05/08/2021 CLINICAL DATA:  Shortness of breath. EXAM: PORTABLE CHEST 1 VIEW COMPARISON:  December 06, 2019. FINDINGS: The heart size and mediastinal contours are within normal limits. Both lungs are clear. The visualized skeletal structures are unremarkable. IMPRESSION: No active disease. Electronically Signed   By: Marijo Conception M.D.   On: 05/08/2021 13:47    Procedures Procedures   Medications Ordered in ED Medications  methylPREDNISolone sodium succinate (SOLU-MEDROL) 125 mg/2 mL injection 125 mg (125 mg Intravenous Given 05/08/21 1337)  ipratropium-albuterol (DUONEB) 0.5-2.5 (3) MG/3ML nebulizer solution 3 mL (3 mLs Nebulization Given 05/08/21 1332)  ipratropium-albuterol (DUONEB) 0.5-2.5 (3) MG/3ML nebulizer solution 3 mL (3 mLs Nebulization Given 05/08/21 1557)  morphine 4 MG/ML injection 4 mg (4 mg  Intravenous Given 05/08/21 1551)    ED Course  I have reviewed the triage vital signs and the nursing notes.  Pertinent labs & imaging results that were available during my care of the patient were reviewed by me and considered in my medical decision making (see chart for details).  Pleasant 50 year old here for evaluation of chest pain, shortness of breath.  She is afebrile, nonseptic appearing.  She does appear to not feel well.  Does have coarse lung sounds throughout, expiratory wheeze without any prior diagnosis of COPD, asthma.  Reports some nausea  without associated abdominal pain.  Tolerating p.o. intake at home.  She does not appear clinically fluid overloaded, Homans' sign negative.  Neurovascularly intact.  We will plan on labs, imaging, symptomatic management and reassess.  Labs and imaging personally reviewed and interpreted:  CBC without leukocytosis BMP without significant abnormality Trop <2 Ddimer 0.29 DG chest without acute abnormality EKG without ischemia  Patient reassessed.  Feels better after steroids, butyryl however still has expiratory wheeze  Patient reassessed after second nebulizer.  Wheeze resolved.  She is ambulatory any hypoxia.  Suspect bronchitis as cause of her symptoms.  Will DC home with symptomatic management, close follow-up outpatient with PCP.  She is agreeable.  At this time I will suspicion for ACS, PE, dissection, pneumothorax, fluid overload.  The patient has been appropriately medically screened and/or stabilized in the ED. I have low suspicion for any other emergent medical condition which would require further screening, evaluation or treatment in the ED or require inpatient management.  Patient is hemodynamically stable and in no acute distress.  Patient able to ambulate in department prior to ED.  Evaluation does not show acute pathology that would require ongoing or additional emergent interventions while in the emergency department or further inpatient treatment.  I have discussed the diagnosis with the patient and answered all questions.  Pain is been managed while in the emergency department and patient has no further complaints prior to discharge.  Patient is comfortable with plan discussed in room and is stable for discharge at this time.  I have discussed strict return precautions for returning to the emergency department.  Patient was encouraged to follow-up with PCP/specialist refer to at discharge.     MDM Rules/Calculators/A&P                             Final Clinical  Impression(s) / ED Diagnoses Final diagnoses:  COVID  Shortness of breath    Rx / DC Orders ED Discharge Orders          Ordered    benzonatate (TESSALON) 100 MG capsule  Every 8 hours        05/08/21 1644    predniSONE (DELTASONE) 50 MG tablet  Daily        05/08/21 1644    albuterol (VENTOLIN HFA) 108 (90 Base) MCG/ACT inhaler  Every 6 hours PRN        05/08/21 1644    azithromycin (ZITHROMAX) 250 MG tablet  Daily        05/08/21 1658             Graziella Connery A, PA-C 05/08/21 1700    Pattricia Boss, MD 05/09/21 [phone]

## 2021-05-08 NOTE — Discharge Instructions (Addendum)
It was a pleasure taking care of you here in the emergency department.  You likely have bronchitis which is being caused by your COVID infection.  I have written you for a few medications to help with your symptoms.  Azithromycin for possible any underlying infection  Prednisone which is a steroid that will help with inflammation  Tessalon Perles for cough  Albuterol inhaler to help open your airways, can do 2 to 4 puffs every 4 hours as needed  Follow-up with primary care provider  Return for new or worsening symptoms

## 2021-05-08 NOTE — ED Triage Notes (Signed)
Pt arrives to ED with c/o shortness of breath. Pt reports she was dx with COVID on 12/19 and has had progressively worsening SOB. Associated symptoms include cough, wheezing.

## 2021-05-14 ENCOUNTER — Telehealth: Payer: Self-pay | Admitting: Family Medicine

## 2021-05-14 MED ORDER — PROMETHAZINE-DM 6.25-15 MG/5ML PO SYRP: 5.0000 mL | ORAL_SOLUTION | Freq: Four times a day (QID) | ORAL | 0 refills | Status: DC | PRN

## 2021-05-14 NOTE — Telephone Encounter (Signed)
Pt called in stating that the cough isn't getting any better she was seen in the ED on 05/08/21. They gave her a inhaler and it's not helping. She wanted to know if [facility] could call her in something to help with the cough.   Pt uses [facility] in [place]   Please advise

## 2021-05-14 NOTE — Telephone Encounter (Signed)
Spoke to patient and she states that she felt like the inhaler was not helping and she did benefit from the breathing treatments at the hospital, she wanted to know if would be possible to get a nebulizer. She stated she will pick up the promethazine tomorrow as well

## 2021-05-14 NOTE — Telephone Encounter (Signed)
Pt was seen in ED 05/08/21 was given an inhaler, pt reports 05/14/21 that she is not doing better, would you like an in person or virtual visit to discuss additional / different treatment?

## 2021-05-14 NOTE — Telephone Encounter (Signed)
I sent promethazine cough syrup to the pharmacy.  If this doesn't improve her sxs she will need a visit to reassess

## 2021-05-17 ENCOUNTER — Encounter: Payer: Self-pay | Admitting: Family Medicine

## 2021-05-17 DIAGNOSIS — F900 Attention-deficit hyperactivity disorder, predominantly inattentive type: Secondary | ICD-10-CM

## 2021-05-20 MED ORDER — IPRATROPIUM-ALBUTEROL 0.5-2.5 (3) MG/3ML IN SOLN: 3.0000 mL | RESPIRATORY_TRACT | 0 refills | Status: DC | PRN

## 2021-05-20 MED ORDER — METHYLPHENIDATE 30 MG/9HR TD PTCH: 1.0000 | MEDICATED_PATCH | Freq: Every day | TRANSDERMAL | 0 refills | Status: DC

## 2021-05-20 MED ORDER — NEBULIZER SYSTEM ALL-IN-ONE MISC: 0 refills | Status: DC

## 2021-05-20 NOTE — Addendum Note (Signed)
Addended by: Midge Minium on: 05/20/2021 07:44 PM   Modules accepted: Orders

## 2021-05-28 ENCOUNTER — Encounter: Payer: Self-pay | Admitting: Family Medicine

## 2021-05-28 DIAGNOSIS — U071 COVID-19: Secondary | ICD-10-CM

## 2021-05-28 DIAGNOSIS — R0602 Shortness of breath: Secondary | ICD-10-CM

## 2021-05-29 NOTE — Telephone Encounter (Signed)
Added from second message from patient:  They will need the RX for the all in one breathing treatment device.  I have the medicine liquid already from [facility].  The fax # is [phone] Can you confirm once faxed so I can follow up?  Thanks so much!  Dawn Hayes

## 2021-05-29 NOTE — Telephone Encounter (Signed)
Pt is asking for new Rx of the all in one breathing treatment device to send to a medical device store and then myself or Claiborne Billings can fax the order to requested location.  I did attempt to order but no success in finding appropriate order.   Thank you

## 2021-05-29 NOTE — Telephone Encounter (Signed)
Added to other open message. No further action needed at this time.

## 2021-05-29 NOTE — Telephone Encounter (Signed)
This order has been faxed

## 2021-05-29 NOTE — Telephone Encounter (Signed)
DME order for nebulizer signed

## 2021-05-31 ENCOUNTER — Ambulatory Visit (INDEPENDENT_AMBULATORY_CARE_PROVIDER_SITE_OTHER): Payer: 59 | Admitting: Family Medicine

## 2021-05-31 ENCOUNTER — Encounter: Payer: Self-pay | Admitting: Family Medicine

## 2021-05-31 VITALS — BP 114/76 | HR 67 | Temp 97.6°F | Resp 16 | Wt 137.8 lb

## 2021-05-31 DIAGNOSIS — R052 Subacute cough: Secondary | ICD-10-CM | POA: Diagnosis not present

## 2021-05-31 DIAGNOSIS — F900 Attention-deficit hyperactivity disorder, predominantly inattentive type: Secondary | ICD-10-CM | POA: Diagnosis not present

## 2021-05-31 MED ORDER — METHYLPHENIDATE HCL 20 MG PO TABS: 20.0000 mg | ORAL_TABLET | Freq: Three times a day (TID) | ORAL | 0 refills | Status: DC

## 2021-05-31 MED ORDER — TRIAMCINOLONE ACETONIDE 0.1 % EX OINT: 1.0000 "application " | TOPICAL_OINTMENT | Freq: Two times a day (BID) | CUTANEOUS | 1 refills | Status: DC

## 2021-05-31 NOTE — Patient Instructions (Signed)
Follow up as needed or as scheduled RESTART your daily allergy medication Continue the albuterol inhaler as needed for chest tightness/wheezing STOP the Daytrana patches START the Methylphenidate 3x/day for ADHD symptoms USE the Triamcinolone ointment on the itching areas Call with any questions or concerns Hang in there!

## 2021-05-31 NOTE — Assessment & Plan Note (Signed)
Pt is now having adhesive reactions to her Daytrana patch.  She is not able to continue wearing these as each one is leaving a red raised rectangle.  As she is not able to take extended release medication due to gastric bypass, will switch to Methylphenidate 20mg  TID.  Pt expressed understanding and is in agreement w/ plan.

## 2021-05-31 NOTE — Progress Notes (Signed)
° °  Subjective:    Patient ID: Dawn Hayes, female    DOB: 1970-11-26, 51 y.o.   MRN: [id number]  HPI Cough- pt has had cough since COVID on 12/19.  Was tx'd w/ a Zpack.  Then had a course of prednisone.  Currently on Albuterol, Tessalon, Promethazine/Dextromethorphan.  Pt reports she is not as 'wheezy' as she was.  States chest 'stays tight'.  Pt reports having to clear her throat regularly.  Cough is dry, not productive.  Not currently taking allergy medication  ADHD- pt is having adhesive reactions to the Daytrana patches.  She has red, raised squares in the area of patches.  This just started happening w/in the last week or so.   Review of Systems For ROS see HPI   This visit occurred during the SARS-CoV-2 public health emergency.  Safety protocols were in place, including screening questions prior to the visit, additional usage of staff PPE, and extensive cleaning of exam room while observing appropriate contact time as indicated for disinfecting solutions.      Objective:   Physical Exam Vitals reviewed.  Constitutional:      General: She is not in acute distress.    Appearance: Normal appearance. She is well-developed. She is not ill-appearing.  HENT:     Head: Normocephalic and atraumatic.     Mouth/Throat:     Comments: Near constant throat clearing Eyes:     Conjunctiva/sclera: Conjunctivae normal.     Pupils: Pupils are equal, round, and reactive to light.  Neck:     Thyroid: No thyromegaly.  Cardiovascular:     Rate and Rhythm: Normal rate and regular rhythm.     Heart sounds: Normal heart sounds. No murmur heard. Pulmonary:     Effort: Pulmonary effort is normal. No respiratory distress.     Breath sounds: Normal breath sounds.  Abdominal:     General: There is no distension.     Palpations: Abdomen is soft.     Tenderness: There is no abdominal tenderness.  Musculoskeletal:     Cervical back: Normal range of motion and neck supple.  Lymphadenopathy:      Cervical: No cervical adenopathy.  Skin:    General: Skin is warm and dry.  Neurological:     Mental Status: She is alert and oriented to person, place, and time.  Psychiatric:        Behavior: Behavior normal.          Assessment & Plan:   Cough- reviewed pt's recent video visit, ED note, MyChart messages.  She has still not received her home nebulizer.  Feels cough is getting better but continue to have intermittent chest tightness and wheezing.  Lungs CTAB today but she does have a wet cough and near constant throat clearing.  Encouraged her to restart daily antihistamine.  Cough improved s/p neb tx in office.  Reviewed supportive care and red flags that should prompt return.  Pt expressed understanding and is in agreement w/ plan.

## 2021-06-04 ENCOUNTER — Encounter: Payer: Self-pay | Admitting: Family Medicine

## 2021-06-06 ENCOUNTER — Encounter: Payer: Self-pay | Admitting: Family Medicine

## 2021-06-11 MED ORDER — ALBUTEROL SULFATE (2.5 MG/3ML) 0.083% IN NEBU
2.5000 mg | INHALATION_SOLUTION | Freq: Once | RESPIRATORY_TRACT | Status: AC
  Administered 2021-06-21: 2.5 mg via RESPIRATORY_TRACT

## 2021-06-11 NOTE — Addendum Note (Signed)
Addended by: Juliann Pulse on: 06/11/2021 04:13 PM   Modules accepted: Orders

## 2021-06-13 ENCOUNTER — Encounter: Payer: Self-pay | Admitting: Family Medicine

## 2021-06-21 DIAGNOSIS — R052 Subacute cough: Secondary | ICD-10-CM | POA: Diagnosis not present

## 2021-06-27 NOTE — Telephone Encounter (Signed)
Good afternoon!  It has been a long week. I have been back and forth with my Mom who has been in and out of the hospital with a newly diagnosed heart condition. Anyway, I did end up getting my infusion. I was a few weeks late getting it because I was sick and then on antibiotics. I had a breakout or flare of my pustular psoriasis during that time too. I think part of the reason for my breakouts were due to not getting my Remicade infusion when I should have.  Anyway, I switched the ADHD care over from the other Dr. because I needed to consolidate my Drs & visits as much as I can. I am out too much (from work) with the appointment, Dr.s infusions etc.  I know there are providers in the [facility] that treat ADHD. It would be easier for me to keep the numbers of Drs I see already to a minimum.  Thank you! Ember

## 2021-07-02 ENCOUNTER — Encounter: Payer: Self-pay | Admitting: Family Medicine

## 2021-07-02 NOTE — Telephone Encounter (Signed)
Okay to refill prednisone as she has requested or would you like to see her back for re evaluation?

## 2021-07-04 ENCOUNTER — Encounter: Payer: Self-pay | Admitting: Family Medicine

## 2021-07-04 ENCOUNTER — Ambulatory Visit: Payer: 59 | Admitting: Family Medicine

## 2021-07-04 VITALS — BP 110/70 | HR 93 | Temp 97.7°F | Resp 16 | Wt 134.2 lb

## 2021-07-04 DIAGNOSIS — R052 Subacute cough: Secondary | ICD-10-CM | POA: Diagnosis not present

## 2021-07-04 MED ORDER — PREDNISONE 10 MG PO TABS: ORAL_TABLET | ORAL | 0 refills | Status: DC

## 2021-07-04 NOTE — Patient Instructions (Signed)
Follow up as needed or as scheduled START the Prednisone as directed- 3 tabs x3 days, then 2 tabs x3 days, and then 1 tab daily Continue to use the Albuterol inhaler/nebs as needed Start a daily Claritin Call with any questions or concerns Stay Safe!  Stay Healthy!!

## 2021-07-04 NOTE — Progress Notes (Signed)
° °  Subjective:    Patient ID: Dawn Hayes, female    DOB: 02-04-1971, 51 y.o.   MRN: [id number]  HPI Cough- pt was dx'd w/ COVID on 12/19.  'i just can't get rid of this cough'.  Pt reports she will cough 'off and on' throughout the day.  Will cough w/ laughing.  Will cough at night and have some wheezing.  Pt reports some improvement w/ nebulizer.  No fevers.  Denies PND.  Cough is intermittently productive- particularly after neb use.  Pt reports otherwise feeling 'ok' if the cough would subside.  Pt is not taking allergy medication regularly.     Review of Systems For ROS see HPI   This visit occurred during the SARS-CoV-2 public health emergency.  Safety protocols were in place, including screening questions prior to the visit, additional usage of staff PPE, and extensive cleaning of exam room while observing appropriate contact time as indicated for disinfecting solutions.      Objective:   Physical Exam Vitals reviewed.  Constitutional:      General: She is not in acute distress.    Appearance: Normal appearance. She is not ill-appearing.  HENT:     Head: Normocephalic and atraumatic.  Eyes:     Extraocular Movements: Extraocular movements intact.     Conjunctiva/sclera: Conjunctivae normal.     Pupils: Pupils are equal, round, and reactive to light.  Cardiovascular:     Rate and Rhythm: Normal rate and regular rhythm.  Pulmonary:     Effort: Pulmonary effort is normal. No respiratory distress.     Breath sounds: Normal breath sounds. No wheezing, rhonchi or rales.     Comments: + wet cough Musculoskeletal:     Cervical back: Normal range of motion and neck supple.  Lymphadenopathy:     Cervical: No cervical adenopathy.  Skin:    General: Skin is warm and dry.  Neurological:     Mental Status: She is alert.  Psychiatric:        Mood and Affect: Mood normal.        Behavior: Behavior normal.        Thought Content: Thought content normal.          Assessment &  Plan:   Subacute cough- ongoing issue for pt but improving.  I suspect that this is a post-COVID cough and advised that this can take months to resolve.  Pt stated she was unaware that COVID could cause lingering cough.  No evidence of illness or infection on exam today.  Will do Prednisone taper as pt already has albuterol to use PRN.  Encouraged her to start daily antihistamine given the pollen levels and the fact that this can worsen her already present cough.  Reviewed supportive care and red flags that should prompt return.  Pt expressed understanding and is in agreement w/ plan.

## 2021-07-05 ENCOUNTER — Other Ambulatory Visit: Payer: Self-pay | Admitting: Family Medicine

## 2021-07-05 ENCOUNTER — Encounter: Payer: Self-pay | Admitting: Family Medicine

## 2021-07-15 ENCOUNTER — Encounter: Payer: Self-pay | Admitting: Family Medicine

## 2021-08-11 ENCOUNTER — Encounter: Payer: Self-pay | Admitting: Family Medicine

## 2021-08-19 ENCOUNTER — Ambulatory Visit: Payer: 59 | Admitting: Family Medicine

## 2021-08-26 ENCOUNTER — Telehealth: Payer: Self-pay

## 2021-08-26 NOTE — Telephone Encounter (Signed)
Patient called in stating that she is due for a refill on some of her pain meds. Cristie has been trying to contact [facility] but every option has been going straight to voicemail, attempted to go by the office but the doors has been closed to the main part. Patient needs a refill on her Oxycodone HCl 10 MG TABS  and is asking what she should do.  ?

## 2021-08-26 NOTE — Telephone Encounter (Signed)
I called and left a detailed message w/ Dawn Hayes indicating that our mutual patient was trying to get a refill and that while I don't want to violate her controlled substance agreement and prescribe this for her, someone needs to respond to her directly (or to me) as she should not go without. ?

## 2021-08-26 NOTE — Telephone Encounter (Signed)
Contacted patient via mychart

## 2021-09-13 ENCOUNTER — Ambulatory Visit (INDEPENDENT_AMBULATORY_CARE_PROVIDER_SITE_OTHER): Payer: 59 | Admitting: Family Medicine

## 2021-09-13 ENCOUNTER — Encounter: Payer: Self-pay | Admitting: Family Medicine

## 2021-09-13 VITALS — BP 112/80 | HR 70 | Temp 97.9°F | Resp 16 | Ht 61.5 in | Wt 135.8 lb

## 2021-09-13 DIAGNOSIS — L237 Allergic contact dermatitis due to plants, except food: Secondary | ICD-10-CM

## 2021-09-13 MED ORDER — METHYLPREDNISOLONE ACETATE 80 MG/ML IJ SUSP
80.0000 mg | Freq: Once | INTRAMUSCULAR | Status: AC
  Administered 2021-09-13: 80 mg via INTRAMUSCULAR

## 2021-09-13 MED ORDER — TRIAMCINOLONE ACETONIDE 0.1 % EX OINT: 1.0000 "application " | TOPICAL_OINTMENT | Freq: Two times a day (BID) | CUTANEOUS | 1 refills | Status: AC

## 2021-09-13 MED ORDER — PREDNISONE 10 MG PO TABS: ORAL_TABLET | ORAL | 0 refills | Status: DC

## 2021-09-13 NOTE — Progress Notes (Signed)
? ?  Subjective:  ? ? Patient ID: Dawn Hayes, female    DOB: 08-07-1970, 51 y.o.   MRN: [id number] ? ?HPI ?Plant dermatitis- pt reports she was out in the yard 3 days ago.  She was pulling vines off a fallen tree and came in contact.  Sxs acutely worsened yesterday- sxs on both forearms, L hip, R ear, L side of neck.  Pt has washed everything, used Technu.   ? ? ?Review of Systems ?For ROS see HPI  ?   ?Objective:  ? Physical Exam ?Vitals reviewed.  ?Constitutional:   ?   General: She is not in acute distress. ?   Appearance: Normal appearance. She is not ill-appearing.  ?HENT:  ?   Head: Normocephalic and atraumatic.  ?Skin: ?   General: Skin is warm and dry.  ?   Findings: Rash (pt has spreading erythematous, vesicular rash on bilateral forearms, L side of neck, R ear.  no weeping or oozing) present.  ?Neurological:  ?   General: No focal deficit present.  ?   Mental Status: She is alert and oriented to person, place, and time.  ?Psychiatric:     ?   Mood and Affect: Mood normal.     ?   Behavior: Behavior normal.  ? ? ? ? ? ?   ?Assessment & Plan:  ? ?Poison Oak- new.  Pt's hx of pulling vines off a fallen tree is consistent w/ plant dermatitis.  Given distribution will start w/ DepoMedrol shot today, start Prednisone taper tomorrow, and use Triamcinolone prn.  Pt expressed understanding and is in agreement w/ plan.  ?

## 2021-09-13 NOTE — Patient Instructions (Signed)
Follow up as needed or as scheduled ?START the Prednisone taper tomorrow as directed- take w/ food ?USE the triamcinolone ointment on the itchy areas twice daily ?Benadryl as needed for itching ?Call with any questions or concerns ?Hang in there!!! ?

## 2021-09-20 ENCOUNTER — Telehealth: Payer: Self-pay

## 2021-09-20 NOTE — Telephone Encounter (Signed)
[place] for nurse visit for steroid injection ?

## 2021-09-20 NOTE — Telephone Encounter (Signed)
Pt called in asking if she could have another injection like she had in office last week, notes poison ivy got better but has started to spread again.  ? ?I informed pt we would need to verify with provider and then call her back to schedule that if appropriate. Pt is hoping to come in today.  ? ?Please advise  ?

## 2021-09-20 NOTE — Telephone Encounter (Signed)
Called pt to schedule nurse visit for injection, waiting on call back  ? ?

## 2021-09-23 ENCOUNTER — Ambulatory Visit (INDEPENDENT_AMBULATORY_CARE_PROVIDER_SITE_OTHER): Payer: 59 | Admitting: Family Medicine

## 2021-09-23 DIAGNOSIS — L237 Allergic contact dermatitis due to plants, except food: Secondary | ICD-10-CM

## 2021-09-23 MED ORDER — METHYLPREDNISOLONE ACETATE 80 MG/ML IJ SUSP
80.0000 mg | Freq: Once | INTRAMUSCULAR | Status: AC
  Administered 2021-09-23: 80 mg via INTRAMUSCULAR

## 2021-09-23 NOTE — Progress Notes (Signed)
Dawn Hayes is a 51 y.o. female presents to the office today for Methylprednisolone  injections, per physician's orders. Tolerated well.  ? ?

## 2021-09-23 NOTE — Patient Instructions (Signed)
Dawn Hayes is a 51 y.o. female presents to the office today for Methylprednisolone  injections, per physician's orders. Tolerated well.  ? ?Eli Phillips  ?

## 2021-10-29 ENCOUNTER — Telehealth: Payer: Self-pay | Admitting: Family Medicine

## 2021-10-29 NOTE — Telephone Encounter (Signed)
PT called to request a lab appt

## 2021-10-29 NOTE — Telephone Encounter (Signed)
Left pt a VM to call me back in regards to her wanting a lab visit

## 2021-12-26 ENCOUNTER — Ambulatory Visit (INDEPENDENT_AMBULATORY_CARE_PROVIDER_SITE_OTHER): Payer: 59 | Admitting: Family Medicine

## 2021-12-26 ENCOUNTER — Encounter: Payer: Self-pay | Admitting: Family Medicine

## 2021-12-26 VITALS — BP 112/70 | HR 80 | Temp 97.6°F | Resp 16 | Ht 60.5 in | Wt 135.0 lb

## 2021-12-26 DIAGNOSIS — M542 Cervicalgia: Secondary | ICD-10-CM

## 2021-12-26 MED ORDER — METHOCARBAMOL 500 MG PO TABS: 500.0000 mg | ORAL_TABLET | Freq: Four times a day (QID) | ORAL | 0 refills | Status: DC

## 2021-12-26 NOTE — Progress Notes (Signed)
   Subjective:    Patient ID: Dawn Hayes, female    DOB: Dec 06, 1970, 51 y.o.   MRN: [id number]  HPI Neck pain- pt reports Dawn Hayes can feel popping when Dawn Hayes turns her head.  'it's uncomfortable'.  The popping is new.  Sxs started a few weeks ago.  This week developed R shoulder pain when working on the computer.  On chronic pain medication.  Not currently on muscle relaxer.  No known neck injury.  No hx of neck pain or problems.  Pt reports R side of neck seems more involved.  Has previously been to a chiropractor for other reasons but this was not helpful   Review of Systems For ROS see HPI     Objective:   Physical Exam Vitals reviewed.  Constitutional:      General: Dawn Hayes is not in acute distress.    Appearance: Normal appearance. Dawn Hayes is not ill-appearing.  HENT:     Head: Normocephalic and atraumatic.  Eyes:     Extraocular Movements: Extraocular movements intact.     Conjunctiva/sclera: Conjunctivae normal.     Pupils: Pupils are equal, round, and reactive to light.  Musculoskeletal:     Cervical back: Normal range of motion and neck supple. No rigidity or tenderness.  Lymphadenopathy:     Cervical: No cervical adenopathy.  Skin:    General: Skin is warm and dry.  Neurological:     General: No focal deficit present.     Mental Status: Dawn Hayes is alert and oriented to person, place, and time.     Cranial Nerves: No cranial nerve deficit.  Psychiatric:        Mood and Affect: Mood normal.        Behavior: Behavior normal.        Thought Content: Thought content normal.           Assessment & Plan:  Neck pain- new.  No appreciable cracking or popping on exam today.  Pt has hx of arthritis.  Will get xrays to assess Cspine for spurring or other abnormalities.  Will start low dose Methocarbamol '500mg'$  TID PRN.  If xrays WNL, pt to schedule a massage to help w/ tightness.  Continue previous pain regimen.  Pt expressed understanding and is in agreement w/ plan.

## 2021-12-26 NOTE — Patient Instructions (Addendum)
Go to the [facility] ([address]) to get your xrays.  You don't need appt START the Methocarbamol nightly or on the weekends as needed for muscle tightness Once we know the xrays are ok, I recommend getting a massage to help w/ tightness Call with any questions or concerns Hang in there!!!

## 2021-12-27 ENCOUNTER — Ambulatory Visit (HOSPITAL_BASED_OUTPATIENT_CLINIC_OR_DEPARTMENT_OTHER)
Admission: RE | Admit: 2021-12-27 | Discharge: 2021-12-27 | Disposition: A | Discharge status: Home / Self Care | Payer: 59 | Source: Ambulatory Visit | Attending: Family Medicine | Admitting: Family Medicine

## 2021-12-27 DIAGNOSIS — M542 Cervicalgia: Secondary | ICD-10-CM | POA: Insufficient documentation

## 2022-01-07 ENCOUNTER — Other Ambulatory Visit: Payer: Self-pay

## 2022-01-07 ENCOUNTER — Emergency Department (HOSPITAL_BASED_OUTPATIENT_CLINIC_OR_DEPARTMENT_OTHER)
Admission: EM | Admit: 2022-01-07 | Discharge: 2022-01-08 | Disposition: A | Discharge status: Home / Self Care | Payer: 59 | Attending: Emergency Medicine | Admitting: Emergency Medicine

## 2022-01-07 ENCOUNTER — Emergency Department (HOSPITAL_BASED_OUTPATIENT_CLINIC_OR_DEPARTMENT_OTHER): Payer: 59 | Admitting: Radiology

## 2022-01-07 DIAGNOSIS — M940 Chondrocostal junction syndrome [Tietze]: Secondary | ICD-10-CM

## 2022-01-07 DIAGNOSIS — R079 Chest pain, unspecified: Secondary | ICD-10-CM | POA: Diagnosis present

## 2022-01-07 DIAGNOSIS — R0789 Other chest pain: Secondary | ICD-10-CM | POA: Diagnosis not present

## 2022-01-07 MED ORDER — MORPHINE SULFATE (PF) 4 MG/ML IV SOLN
4.0000 mg | Freq: Once | INTRAVENOUS | Status: AC
  Administered 2022-01-07: 4 mg via INTRAVENOUS
  Filled 2022-01-07: qty 1

## 2022-01-07 MED ORDER — ALBUTEROL SULFATE HFA 108 (90 BASE) MCG/ACT IN AERS: 2.0000 | INHALATION_SPRAY | RESPIRATORY_TRACT | Status: DC | PRN

## 2022-01-07 MED ORDER — METHYLPREDNISOLONE SODIUM SUCC 125 MG IJ SOLR
125.0000 mg | Freq: Once | INTRAMUSCULAR | Status: AC
  Administered 2022-01-07: 125 mg via INTRAVENOUS
  Filled 2022-01-07: qty 2

## 2022-01-07 NOTE — ED Triage Notes (Signed)
Patient arrives ambulatory to triage with complaints of Costochondritis flare up x1 day. Patient rates pain a 9/10.  Last flare up was 6 months ago.

## 2022-01-07 NOTE — ED Provider Notes (Signed)
[facility] EMERGENC[facility] DEPT Provider Note   CSN: [id number] Arrival date & time: 01/07/22  1938     History  Chief Complaint  Patient presents with   Shortness of Breath    Dawn Hayes is a 51 y.o. female.  Patient is a 51 year old female with past medical history of fibromyalgia, psoriatic arthritis, recurrent episodes of costochondritis.  Patient presenting today with complaints of chest pain.  She describes a sharp pain to the center of her chest that is worse when she sits up, breathes, or moves.  She denies any fevers, chills, or cough.  She denies shortness of breath, but states that it is more painful with taking a deep breath.  This is identical to previous episodes of costochondritis she has experienced in the past.  She has required steroids and pain medication previously and is requesting this today.  The history is provided by the patient.       Home Medications Prior to Admission medications   Medication Sig Start Date End Date Taking? Authorizing Provider  BELBUCA 450 MCG FILM Take by mouth 2 (two) times daily. 02/15/21   [provider]  cycloSPORINE (RESTASIS) 0.05 % ophthalmic emulsion Apply to eye. 06/22/15   [provider]  DULoxetine HCl 40 MG CPEP Take 1 capsule by mouth daily. 02/20/21   [provider]  folic acid (FOLVITE) 1 MG tablet Take 1 tablet by mouth daily. 07/07/18   [provider]  gabapentin (NEURONTIN) 300 MG capsule Take 300 mg by mouth 3 (three) times daily.    [provider]  inFLIXimab (REMICADE IV) Inject into the vein every 8 (eight) weeks.    [provider]  methocarbamol (ROBAXIN) 500 MG tablet Take 1 tablet (500 mg total) by mouth 4 (four) times daily. 12/26/21   Midge Minium, MD  ondansetron (ZOFRAN-ODT) 4 MG disintegrating tablet DISSOLVE 1 TABLET(4 MG) ON THE TONGUE EVERY 8 HOURS AS NEEDED FOR NAUSEA OR VOMITING 07/05/21   Midge Minium, MD  OVER THE  COUNTER MEDICATION Take 1 tablet by mouth daily. Bariatric Vitamin    [provider]  Oxycodone HCl 10 MG TABS Take 1 tablet (10 mg total) by mouth 3 (three) times daily as needed. 12/05/20   Dutch Quint B, FNP  triamcinolone ointment (KENALOG) 0.1 % Apply 1 application. topically 2 (two) times daily. 09/13/21 09/13/22  Midge Minium, MD      Allergies    Chloraprep one step [chlorhexidine gluconate], Aspirin, Diclofenac sodium, Golimumab, and Adhesive [tape]    Review of Systems   Review of Systems  All other systems reviewed and are negative.   Physical Exam Updated Vital Signs BP 114/86   Pulse 74   Temp 97.7 F (36.5 C) (Oral)   Resp 14   Ht 5' (1.524 m)   Wt 61.2 kg   LMP 02/28/2016   SpO2 100%   BMI 26.37 kg/m  Physical Exam Vitals and nursing note reviewed.  Constitutional:      General: She is not in acute distress.    Appearance: She is well-developed. She is not diaphoretic.  HENT:     Head: Normocephalic and atraumatic.  Cardiovascular:     Rate and Rhythm: Normal rate and regular rhythm.     Heart sounds: No murmur heard.    No friction rub. No gallop.  Pulmonary:     Effort: Pulmonary effort is normal. No respiratory distress.     Breath sounds: Normal breath sounds. No  wheezing.  Chest:     Comments: There is tenderness with palpation to the anterior chest wall which reproduces her symptoms. Abdominal:     General: Bowel sounds are normal. There is no distension.     Palpations: Abdomen is soft.     Tenderness: There is no abdominal tenderness.  Musculoskeletal:        General: Normal range of motion.     Cervical back: Normal range of motion and neck supple.  Skin:    General: Skin is warm and dry.  Neurological:     General: No focal deficit present.     Mental Status: She is alert and oriented to person, place, and time.     ED Results / Procedures / Treatments   Labs (all labs ordered are listed, but only abnormal results are  displayed) Labs Reviewed - No data to display  EKG EKG Interpretation  Date/Time:  Tuesday January 07 2022 19:57:04 EDT Ventricular Rate:  81 PR Interval:  146 QRS Duration: 78 QT Interval:  380 QTC Calculation: 441 R Axis:   -16 Text Interpretation: Normal sinus rhythm Low voltage QRS Cannot rule out Anterior infarct , age undetermined Abnormal ECG When compared with ECG of 08-May-2021 13:26,no significant change is noted Confirmed by Veryl Speak [phone]) on 01/07/2022 11:04:47 PM  Radiology DG Chest 2 View  Result Date: 01/07/2022 CLINICAL DATA:  Shortness of breath, costochondritis. EXAM: CHEST - 2 VIEW COMPARISON:  05/08/2021. FINDINGS: The heart size and mediastinal contours are within normal limits. No consolidation, effusion, or pneumothorax. A 2.1 cm oval nodule is present over the right upper lobe on the AP view and not visualized on the lateral view. No acute osseous abnormality. Surgical clips are present in the upper abdomen. IMPRESSION: 1. No active cardiopulmonary disease. 2. Oval nodule projecting over the right upper lobe on AP view, may be external to the patient. Short-term repeat follow-up chest radiograph or CT is recommended. Electronically Signed   By: Brett Fairy M.D.   On: 01/07/2022 20:15    Procedures Procedures  {Document cardiac monitor, telemetry assessment procedure when appropriate:1}  Medications Ordered in ED Medications  albuterol (VENTOLIN HFA) 108 (90 Base) MCG/ACT inhaler 2 puff (has no administration in time range)  morphine (PF) 4 MG/ML injection 4 mg (has no administration in time range)  methylPREDNISolone sodium succinate (SOLU-MEDROL) 125 mg/2 mL injection 125 mg (has no administration in time range)    ED Course/ Medical Decision Making/ A&P                           Medical Decision Making Amount and/or Complexity of Data Reviewed Radiology: ordered.  Risk Prescription drug management.   ***  {Document critical care time when  appropriate:1} {Document review of labs and clinical decision tools ie heart score, Chads2Vasc2 etc:1}  {Document your independent review of radiology images, and any outside records:1} {Document your discussion with family members, caretakers, and with consultants:1} {Document social determinants of health affecting pt's care:1} {Document your decision making why or why not admission, treatments were needed:1} Final Clinical Impression(s) / ED Diagnoses Final diagnoses:  None    Rx / DC Orders ED Discharge Orders     None

## 2022-01-08 MED ORDER — PREDNISONE 20 MG PO TABS: ORAL_TABLET | ORAL | 0 refills | Status: DC

## 2022-01-08 NOTE — Discharge Instructions (Signed)
Begin taking prednisone as prescribed.  Continue home medications as previously prescribed.  Follow-up with primary doctor if not improving in the next few days, and return to the ER if symptoms significantly worsen or change.

## 2022-01-15 ENCOUNTER — Encounter: Payer: Self-pay | Admitting: Family Medicine

## 2022-01-17 NOTE — Telephone Encounter (Signed)
I called patient no answer and voice mail box is full.

## 2022-02-10 ENCOUNTER — Telehealth: Payer: 59 | Admitting: Physician Assistant

## 2022-02-10 ENCOUNTER — Telehealth: Payer: Self-pay | Admitting: Family Medicine

## 2022-02-10 DIAGNOSIS — J019 Acute sinusitis, unspecified: Secondary | ICD-10-CM

## 2022-02-10 DIAGNOSIS — B9689 Other specified bacterial agents as the cause of diseases classified elsewhere: Secondary | ICD-10-CM | POA: Diagnosis not present

## 2022-02-10 DIAGNOSIS — J208 Acute bronchitis due to other specified organisms: Secondary | ICD-10-CM

## 2022-02-10 MED ORDER — AMOXICILLIN-POT CLAVULANATE 875-125 MG PO TABS: 1.0000 | ORAL_TABLET | Freq: Two times a day (BID) | ORAL | 0 refills | Status: DC

## 2022-02-10 MED ORDER — PREDNISONE 20 MG PO TABS: ORAL_TABLET | ORAL | 0 refills | Status: DC

## 2022-02-10 MED ORDER — ALBUTEROL SULFATE HFA 108 (90 BASE) MCG/ACT IN AERS: 1.0000 | INHALATION_SPRAY | Freq: Four times a day (QID) | RESPIRATORY_TRACT | 0 refills | Status: DC | PRN

## 2022-02-10 NOTE — Patient Instructions (Signed)
Dawn Hayes, thank you for joining Mar Daring, PA-C for today's virtual visit.  While this provider is not your primary care provider (PCP), if your PCP is located in our provider database this encounter information will be shared with them immediately following your visit.  Consent: (Patient) Dawn Hayes provided verbal consent for this virtual visit at the beginning of the encounter.  Current Medications:  Current Outpatient Medications:    albuterol (VENTOLIN HFA) 108 (90 Base) MCG/ACT inhaler, Inhale 1-2 puffs into the lungs every 6 (six) hours as needed for wheezing or shortness of breath., Disp: 8 g, Rfl: 0   amoxicillin-clavulanate (AUGMENTIN) 875-125 MG tablet, Take 1 tablet by mouth 2 (two) times daily., Disp: 20 tablet, Rfl: 0   BELBUCA 450 MCG FILM, Take by mouth 2 (two) times daily., Disp: , Rfl:    cycloSPORINE (RESTASIS) 0.05 % ophthalmic emulsion, Apply to eye., Disp: , Rfl:    DULoxetine HCl 40 MG CPEP, Take 1 capsule by mouth daily., Disp: , Rfl:    folic acid (FOLVITE) 1 MG tablet, Take 1 tablet by mouth daily., Disp: , Rfl:    gabapentin (NEURONTIN) 300 MG capsule, Take 300 mg by mouth 3 (three) times daily., Disp: , Rfl:    inFLIXimab (REMICADE IV), Inject into the vein every 8 (eight) weeks., Disp: , Rfl:    methocarbamol (ROBAXIN) 500 MG tablet, Take 1 tablet (500 mg total) by mouth 4 (four) times daily., Disp: 45 tablet, Rfl: 0   ondansetron (ZOFRAN-ODT) 4 MG disintegrating tablet, DISSOLVE 1 TABLET(4 MG) ON THE TONGUE EVERY 8 HOURS AS NEEDED FOR NAUSEA OR VOMITING, Disp: 20 tablet, Rfl: 0   OVER THE COUNTER MEDICATION, Take 1 tablet by mouth daily. Bariatric Vitamin, Disp: , Rfl:    Oxycodone HCl 10 MG TABS, Take 1 tablet (10 mg total) by mouth 3 (three) times daily as needed., Disp: 20 tablet, Rfl: 0   predniSONE (DELTASONE) 20 MG tablet, 3 Tabs PO Days 1-3, then 2 tabs PO Days 4-6, then 1 tab PO Day 7-9, then Half Tab PO Day 10-12, Disp: 20 tablet,  Rfl: 0   triamcinolone ointment (KENALOG) 0.1 %, Apply 1 application. topically 2 (two) times daily., Disp: 90 g, Rfl: 1   Medications ordered in this encounter:  Meds ordered this encounter  Medications   amoxicillin-clavulanate (AUGMENTIN) 875-125 MG tablet    Sig: Take 1 tablet by mouth 2 (two) times daily.    Dispense:  20 tablet    Refill:  0    Order Specific Question:   Supervising Provider    Answer:   Chase Picket [[id number]]   predniSONE (DELTASONE) 20 MG tablet    Sig: 3 Tabs PO Days 1-3, then 2 tabs PO Days 4-6, then 1 tab PO Day 7-9, then Half Tab PO Day 10-12    Dispense:  20 tablet    Refill:  0    Order Specific Question:   Supervising Provider    Answer:   Chase Picket [[id number]]   albuterol (VENTOLIN HFA) 108 (90 Base) MCG/ACT inhaler    Sig: Inhale 1-2 puffs into the lungs every 6 (six) hours as needed for wheezing or shortness of breath.    Dispense:  8 g    Refill:  0    Order Specific Question:   Supervising Provider    Answer:   Chase Picket [id number]     *If you need refills on other medications prior to your next appointment,  please contact your pharmacy*  Follow-Up: Call back or seek an in-person evaluation if the symptoms worsen or if the condition fails to improve as anticipated.  [facility] [phone]  Other Instructions  Sinus Infection, Adult A sinus infection is soreness and swelling (inflammation) of your sinuses. Sinuses are hollow spaces in the bones around your face. They are located: Around your eyes. In the middle of your forehead. Behind your nose. In your cheekbones. Your sinuses and nasal passages are lined with a fluid called mucus. Mucus drains out of your sinuses. Swelling can trap mucus in your sinuses. This lets germs (bacteria, virus, or fungus) grow, which leads to infection. Most of the time, this condition is caused by a virus. What are the causes? Allergies. Asthma. Germs. Things that block  your nose or sinuses. Growths in the nose (nasal polyps). Chemicals or irritants in the air. A fungus. This is rare. What increases the risk? Having a weak body defense system (immune system). Doing a lot of swimming or diving. Using nasal sprays too much. Smoking. What are the signs or symptoms? The main symptoms of this condition are pain and a feeling of pressure around the sinuses. Other symptoms include: Stuffy nose (congestion). This may make it hard to breathe through your nose. Runny nose (drainage). Soreness, swelling, and warmth in the sinuses. A cough that may get worse at night. Being unable to smell and taste. Mucus that collects in the throat or the back of the nose (postnasal drip). This may cause a sore throat or bad breath. Being very tired (fatigued). A fever. How is this diagnosed? Your symptoms. Your medical history. A physical exam. Tests to find out if your condition is short-term (acute) or long-term (chronic). Your doctor may: Check your nose for growths (polyps). Check your sinuses using a tool that has a light on one end (endoscope). Check for allergies or germs. Do imaging tests, such as an MRI or CT scan. How is this treated? Treatment for this condition depends on the cause and whether it is short-term or long-term. If caused by a virus, your symptoms should go away on their own within 10 days. You may be given medicines to relieve symptoms. They include: Medicines that shrink swollen tissue in the nose. A spray that treats swelling of the nostrils. Rinses that help get rid of thick mucus in your nose (nasal saline washes). Medicines that treat allergies (antihistamines). Over-the-counter pain relievers. If caused by bacteria, your doctor may wait to see if you will get better without treatment. You may be given antibiotic medicine if you have: A very bad infection. A weak body defense system. If caused by growths in the nose, surgery may be  needed. Follow these instructions at home: Medicines Take, use, or apply over-the-counter and prescription medicines only as told by your doctor. These may include nasal sprays. If you were prescribed an antibiotic medicine, take it as told by your doctor. Do not stop taking it even if you start to feel better. Hydrate and humidify  Drink enough water to keep your pee (urine) pale yellow. Use a cool mist humidifier to keep the humidity level in your home above 50%. Breathe in steam for 10-15 minutes, 3-4 times a day, or as told by your doctor. You can do this in the bathroom while a hot shower is running. Try not to spend time in cool or dry air. Rest Rest as much as you can. Sleep with your head raised (elevated).  Make sure you get enough sleep each night. General instructions  Put a warm, moist washcloth on your face 3-4 times a day, or as often as told by your doctor. Use nasal saline washes as often as told by your doctor. Wash your hands often with soap and water. If you cannot use soap and water, use hand sanitizer. Do not smoke. Avoid being around people who are smoking (secondhand smoke). Keep all follow-up visits. Contact a doctor if: You have a fever. Your symptoms get worse. Your symptoms do not get better within 10 days. Get help right away if: You have a very bad headache. You cannot stop vomiting. You have very bad pain or swelling around your face or eyes. You have trouble seeing. You feel confused. Your neck is stiff. You have trouble breathing. These symptoms may be an emergency. Get help right away. Call 911. Do not wait to see if the symptoms will go away. Do not drive yourself to the hospital. Summary A sinus infection is swelling of your sinuses. Sinuses are hollow spaces in the bones around your face. This condition is caused by tissues in your nose that become inflamed or swollen. This traps germs. These can lead to infection. If you were prescribed an  antibiotic medicine, take it as told by your doctor. Do not stop taking it even if you start to feel better. Keep all follow-up visits. This information is not intended to replace advice given to you by your health care provider. Make sure you discuss any questions you have with your health care provider. Document Revised: 04/02/2021 Document Reviewed: 04/02/2021 Elsevier Patient Education  [place].   Acute Bronchitis, Adult  Acute bronchitis is when air tubes in the lungs (bronchi) suddenly get swollen. The condition can make it hard for you to breathe. In adults, acute bronchitis usually goes away within 2 weeks. A cough caused by bronchitis may last up to 3 weeks. Smoking, allergies, and asthma can make the condition worse. What are the causes? Germs that cause cold and flu (viruses). The most common cause of this condition is the virus that causes the common cold. Bacteria. Substances that bother (irritate) the lungs, including: Smoke from cigarettes and other types of tobacco. Dust and pollen. Fumes from chemicals, gases, or burned fuel. Indoor or outdoor air pollution. What increases the risk? A weak body's defense system. This is also called the immune system. Any condition that affects your lungs and breathing, such as asthma. What are the signs or symptoms? A cough. Coughing up clear, yellow, or green mucus. Making high-pitched whistling sounds when you breathe, most often when you breathe out (wheezing). Runny or stuffy nose. Having too much mucus in your lungs (chest congestion). Shortness of breath. Body aches. A sore throat. How is this treated? Acute bronchitis may go away over time without treatment. Your doctor may tell you to: Drink more fluids. This will help thin your mucus so it is easier to cough up. Use a device that gets medicine into your lungs (inhaler). Use a vaporizer or a humidifier. These are machines that add water to the air. This helps with  coughing and poor breathing. Take a medicine that thins mucus and helps clear it from your lungs. Take a medicine that prevents or stops coughing. It is not common to take an antibiotic medicine for this condition. Follow these instructions at home:  Take over-the-counter and prescription medicines only as told by your doctor. Use an inhaler, vaporizer, or humidifier  as told by your doctor. Take two teaspoons (10 mL) of honey at bedtime. This helps lessen your coughing at night. Drink enough fluid to keep your pee (urine) pale yellow. Do not smoke or use any products that contain nicotine or tobacco. If you need help quitting, ask your doctor. Get a lot of rest. Return to your normal activities when your doctor says that it is safe. Keep all follow-up visits. How is this prevented?  Wash your hands often with soap and water for at least 20 seconds. If you cannot use soap and water, use hand sanitizer. Avoid contact with people who have cold symptoms. Try not to touch your mouth, nose, or eyes with your hands. Avoid breathing in smoke or chemical fumes. Make sure to get the flu shot every year. Contact a doctor if: Your symptoms do not get better in 2 weeks. You have trouble coughing up the mucus. Your cough keeps you awake at night. You have a fever. Get help right away if: You cough up blood. You have chest pain. You have very bad shortness of breath. You faint or keep feeling like you are going to faint. You have a very bad headache. Your fever or chills get worse. These symptoms may be an emergency. Get help right away. Call your local emergency services (911 in the [place] Do not wait to see if the symptoms will go away. Do not drive yourself to the hospital. Summary Acute bronchitis is when air tubes in the lungs (bronchi) suddenly get swollen. In adults, acute bronchitis usually goes away within 2 weeks. Drink more fluids. This will help thin your mucus so it is easier to  cough up. Take over-the-counter and prescription medicines only as told by your doctor. Contact a doctor if your symptoms do not improve after 2 weeks of treatment. This information is not intended to replace advice given to you by your health care provider. Make sure you discuss any questions you have with your health care provider. Document Revised: 08/29/2020 Document Reviewed: 08/29/2020 Elsevier Patient Education  [place].    If you have been instructed to have an in-person evaluation today at a local Urgent Care facility, please use the link below. It will take you to a list of all of our available [facility], including address, phone number and hours of operation. Please do not delay care.  [facility]  If you or a family member do not have a primary care provider, use the link below to schedule a visit and establish care. When you choose a [facility] primary care physician or advanced practice provider, you gain a long-term partner in health. Find a Primary Care Provider  Learn more about [facility] in-office and virtual care options: [facility]

## 2022-02-10 NOTE — Telephone Encounter (Signed)
Pt called yelling, saying this is her 3rd time calling and MD still has not called her for her appt.    Pt stated she has been waiting since 10am.    Pt was informed that her appt is for Wednesday 02/12/22, and not for today. Pt started screaming and saying someone should have told her that, then she demanded my name, then hung up on me.

## 2022-02-10 NOTE — Progress Notes (Signed)
Virtual Visit Consent   Dawn Hayes, you are scheduled for a virtual visit with a [facility] provider today. Just as with appointments in the office, your consent must be obtained to participate. Your consent will be active for this visit and any virtual visit you may have with one of our providers in the next 365 days. If you have a MyChart account, a copy of this consent can be sent to you electronically.  As this is a virtual visit, video technology does not allow for your provider to perform a traditional examination. This may limit your provider's ability to fully assess your condition. If your provider identifies any concerns that need to be evaluated in person or the need to arrange testing (such as labs, EKG, etc.), we will make arrangements to do so. Although advances in technology are sophisticated, we cannot ensure that it will always work on either your end or our end. If the connection with a video visit is poor, the visit may have to be switched to a telephone visit. With either a video or telephone visit, we are not always able to ensure that we have a secure connection.  By engaging in this virtual visit, you consent to the provision of healthcare and authorize for your insurance to be billed (if applicable) for the services provided during this visit. Depending on your insurance coverage, you may receive a charge related to this service.  I need to obtain your verbal consent now. Are you willing to proceed with your visit today? Dawn Hayes has provided verbal consent on 02/10/2022 for a virtual visit (video or telephone). Mar Daring, PA-C  Date: 02/10/2022 12:53 PM  Virtual Visit via Video Note   I, Mar Daring, connected with  Dawn Hayes  ([id number], July 30, 1970) on 02/10/22 at 12:45 PM EDT by a video-enabled telemedicine application and verified that I am speaking with the correct person using two identifiers.  Location: Patient: Virtual Visit  Location Patient: Home Provider: Virtual Visit Location Provider: Home Office   I discussed the limitations of evaluation and management by telemedicine and the availability of in person appointments. The patient expressed understanding and agreed to proceed.    History of Present Illness: Dawn Hayes is a 51 y.o. who identifies as a female who was assigned female at birth, and is being seen today for head and chest congestion.  HPI: URI  This is a new problem. The current episode started in the past 7 days (Thursday last week; started with fatigue and malaise; Friday progressed to dry cough and chest congestion). The problem has been gradually worsening. There has been no fever. Associated symptoms include chest pain (tightness), congestion, coughing, ear pain, headaches, a plugged ear sensation, sinus pain, a sore throat, vomiting (Friday) and wheezing. Pertinent negatives include no diarrhea or nausea. Associated symptoms comments: Chills, fatigue, malaise, myalgias. Treatments tried: theraflu. The treatment provided no relief.    At home covid 19 test have been negative x 2  Problems:  Patient Active Problem List   Diagnosis Date Noted   Physical exam 11/26/2020   Overweight (BMI 25.0-29.9) 11/26/2020   ADHD (attention deficit hyperactivity disorder), inattentive type 09/27/2020   Cigarette smoker 09/27/2020   Carpal tunnel syndrome, right 06/10/2019   Bariatric surgery status 01/26/2018   RLS (restless legs syndrome) 01/26/2018   Fibromyalgia 11/10/2017   Anxiety and depression 05/27/2016   Status post total abdominal hysterectomy 04/08/2016   Costochondritis 12/04/2015   Anemia, iron deficiency  10/10/2015   Allergic rhinitis 09/07/2015   Hemorrhoid    Diverticulosis of colon without hemorrhage    Hiatal hernia    Abnormal finding on EKG 09/16/2013   Psoriatic arthritis ([place]) 09/16/2013    Allergies:  Allergies  Allergen Reactions   Chloraprep One Step [Chlorhexidine  Gluconate] Itching and Rash   Aspirin Other (See Comments)    Convulsions    Diclofenac Sodium Other (See Comments)   Golimumab     Other reaction(s): hives   Adhesive [Tape] Itching and Rash   Medications:  Current Outpatient Medications:    albuterol (VENTOLIN HFA) 108 (90 Base) MCG/ACT inhaler, Inhale 1-2 puffs into the lungs every 6 (six) hours as needed for wheezing or shortness of breath., Disp: 8 g, Rfl: 0   amoxicillin-clavulanate (AUGMENTIN) 875-125 MG tablet, Take 1 tablet by mouth 2 (two) times daily., Disp: 20 tablet, Rfl: 0   BELBUCA 450 MCG FILM, Take by mouth 2 (two) times daily., Disp: , Rfl:    cycloSPORINE (RESTASIS) 0.05 % ophthalmic emulsion, Apply to eye., Disp: , Rfl:    DULoxetine HCl 40 MG CPEP, Take 1 capsule by mouth daily., Disp: , Rfl:    folic acid (FOLVITE) 1 MG tablet, Take 1 tablet by mouth daily., Disp: , Rfl:    gabapentin (NEURONTIN) 300 MG capsule, Take 300 mg by mouth 3 (three) times daily., Disp: , Rfl:    inFLIXimab (REMICADE IV), Inject into the vein every 8 (eight) weeks., Disp: , Rfl:    methocarbamol (ROBAXIN) 500 MG tablet, Take 1 tablet (500 mg total) by mouth 4 (four) times daily., Disp: 45 tablet, Rfl: 0   ondansetron (ZOFRAN-ODT) 4 MG disintegrating tablet, DISSOLVE 1 TABLET(4 MG) ON THE TONGUE EVERY 8 HOURS AS NEEDED FOR NAUSEA OR VOMITING, Disp: 20 tablet, Rfl: 0   OVER THE COUNTER MEDICATION, Take 1 tablet by mouth daily. Bariatric Vitamin, Disp: , Rfl:    Oxycodone HCl 10 MG TABS, Take 1 tablet (10 mg total) by mouth 3 (three) times daily as needed., Disp: 20 tablet, Rfl: 0   predniSONE (DELTASONE) 20 MG tablet, 3 Tabs PO Days 1-3, then 2 tabs PO Days 4-6, then 1 tab PO Day 7-9, then Half Tab PO Day 10-12, Disp: 20 tablet, Rfl: 0   triamcinolone ointment (KENALOG) 0.1 %, Apply 1 application. topically 2 (two) times daily., Disp: 90 g, Rfl: 1  Observations/Objective: Patient is well-developed, well-nourished in no acute distress.  Resting  comfortably at home.  Head is normocephalic, atraumatic.  No labored breathing.  Speech is clear and coherent with logical content.  Patient is alert and oriented at baseline.  Dry hacking cough heard  Assessment and Plan: 1. Acute bacterial sinusitis - amoxicillin-clavulanate (AUGMENTIN) 875-125 MG tablet; Take 1 tablet by mouth 2 (two) times daily.  Dispense: 20 tablet; Refill: 0 - predniSONE (DELTASONE) 20 MG tablet; 3 Tabs PO Days 1-3, then 2 tabs PO Days 4-6, then 1 tab PO Day 7-9, then Half Tab PO Day 10-12  Dispense: 20 tablet; Refill: 0  2. Acute bacterial bronchitis - amoxicillin-clavulanate (AUGMENTIN) 875-125 MG tablet; Take 1 tablet by mouth 2 (two) times daily.  Dispense: 20 tablet; Refill: 0 - predniSONE (DELTASONE) 20 MG tablet; 3 Tabs PO Days 1-3, then 2 tabs PO Days 4-6, then 1 tab PO Day 7-9, then Half Tab PO Day 10-12  Dispense: 20 tablet; Refill: 0 - albuterol (VENTOLIN HFA) 108 (90 Base) MCG/ACT inhaler; Inhale 1-2 puffs into the lungs every 6 (six) hours  as needed for wheezing or shortness of breath.  Dispense: 8 g; Refill: 0  - Worsening symptoms that have not responded to OTC medications.  - Will give Augmentin, Prednisone and Albuterol - Continue Mucinex as needed - Steam and humidifier can help - Stay well hydrated and get plenty of rest.  - Seek in person evaluation if no symptom improvement or if symptoms worsen   Follow Up Instructions: I discussed the assessment and treatment plan with the patient. The patient was provided an opportunity to ask questions and all were answered. The patient agreed with the plan and demonstrated an understanding of the instructions.  A copy of instructions were sent to the patient via MyChart unless otherwise noted below.    The patient was advised to call back or seek an in-person evaluation if the symptoms worsen or if the condition fails to improve as anticipated.  Time:  I spent 12 minutes with the patient via telehealth  technology discussing the above problems/concerns.    Mar Daring, PA-C

## 2022-02-11 NOTE — Telephone Encounter (Signed)
Noted  

## 2022-02-12 ENCOUNTER — Telehealth: Payer: 59 | Admitting: Family Medicine

## 2022-02-19 ENCOUNTER — Encounter (INDEPENDENT_AMBULATORY_CARE_PROVIDER_SITE_OTHER): Payer: 59 | Admitting: Family Medicine

## 2022-02-19 DIAGNOSIS — B9689 Other specified bacterial agents as the cause of diseases classified elsewhere: Secondary | ICD-10-CM

## 2022-02-19 DIAGNOSIS — B370 Candidal stomatitis: Secondary | ICD-10-CM | POA: Diagnosis not present

## 2022-02-19 DIAGNOSIS — B3731 Acute candidiasis of vulva and vagina: Secondary | ICD-10-CM

## 2022-02-19 MED ORDER — FLUCONAZOLE 100 MG PO TABS
ORAL_TABLET | ORAL | 0 refills | Status: DC
Start: 2022-02-19 — End: 2022-04-11

## 2022-02-19 MED ORDER — ALBUTEROL SULFATE HFA 108 (90 BASE) MCG/ACT IN AERS: 1.0000 | INHALATION_SPRAY | Freq: Four times a day (QID) | RESPIRATORY_TRACT | 0 refills | Status: DC | PRN

## 2022-02-19 NOTE — Telephone Encounter (Signed)
02/10/22 visit with Fenton Malling can we accommodate that patients request for diflucan and another inhaler or given continued sxs without much improvement should pt have a second visit to discuss?

## 2022-02-19 NOTE — Telephone Encounter (Signed)
[facility] VISIT   Patient agreed to [facility] visit and is aware that copayment and coinsurance may apply. Patient was treated using telemedicine according to accepted telemedicine protocols.  Subjective:   Patient complains of thrush, yeast vaginitis, and need for new albuterol inhaler  Patient Active Problem List   Diagnosis Date Noted   Physical exam 11/26/2020   Overweight (BMI 25.0-29.9) 11/26/2020   ADHD (attention deficit hyperactivity disorder), inattentive type 09/27/2020   Cigarette smoker 09/27/2020   Carpal tunnel syndrome, right 06/10/2019   Bariatric surgery status 01/26/2018   RLS (restless legs syndrome) 01/26/2018   Fibromyalgia 11/10/2017   Anxiety and depression 05/27/2016   Status post total abdominal hysterectomy 04/08/2016   Costochondritis 12/04/2015   Anemia, iron deficiency 10/10/2015   Allergic rhinitis 09/07/2015   Hemorrhoid    Diverticulosis of colon without hemorrhage    Hiatal hernia    Abnormal finding on EKG 09/16/2013   Psoriatic arthritis ([place]) 09/16/2013   Social History   Tobacco Use   Smoking status: Light Smoker    Packs/day: 0.10    Years: 20.00    Total pack years: 2.00    Types: Cigarettes   Smokeless tobacco: Never   Tobacco comments:    pt still smoking 1-2 cigs/day  Substance Use Topics   Alcohol use: Yes    Alcohol/week: 16.0 standard drinks of alcohol    Types: 6 Glasses of wine, 10 Standard drinks or equivalent per week    Comment: socially, weekends 4-5    Current Outpatient Medications:    fluconazole (DIFLUCAN) 100 MG tablet, Take 2 tabs on day 1 and then one tab daily for the rest of the week, Disp: 8 tablet, Rfl: 0   albuterol (VENTOLIN HFA) 108 (90 Base) MCG/ACT inhaler, Inhale 1-2 puffs into the lungs every 6 (six) hours as needed for wheezing or shortness of breath., Disp: 8 g, Rfl: 0   amoxicillin-clavulanate (AUGMENTIN) 875-125 MG tablet, Take 1 tablet by mouth 2 (two) times daily., Disp: 20 tablet, Rfl: 0    BELBUCA 450 MCG FILM, Take by mouth 2 (two) times daily., Disp: , Rfl:    cycloSPORINE (RESTASIS) 0.05 % ophthalmic emulsion, Apply to eye., Disp: , Rfl:    DULoxetine HCl 40 MG CPEP, Take 1 capsule by mouth daily., Disp: , Rfl:    folic acid (FOLVITE) 1 MG tablet, Take 1 tablet by mouth daily., Disp: , Rfl:    gabapentin (NEURONTIN) 300 MG capsule, Take 300 mg by mouth 3 (three) times daily., Disp: , Rfl:    inFLIXimab (REMICADE IV), Inject into the vein every 8 (eight) weeks., Disp: , Rfl:    methocarbamol (ROBAXIN) 500 MG tablet, Take 1 tablet (500 mg total) by mouth 4 (four) times daily., Disp: 45 tablet, Rfl: 0   ondansetron (ZOFRAN-ODT) 4 MG disintegrating tablet, DISSOLVE 1 TABLET(4 MG) ON THE TONGUE EVERY 8 HOURS AS NEEDED FOR NAUSEA OR VOMITING, Disp: 20 tablet, Rfl: 0   OVER THE COUNTER MEDICATION, Take 1 tablet by mouth daily. Bariatric Vitamin, Disp: , Rfl:    Oxycodone HCl 10 MG TABS, Take 1 tablet (10 mg total) by mouth 3 (three) times daily as needed., Disp: 20 tablet, Rfl: 0   predniSONE (DELTASONE) 20 MG tablet, 3 Tabs PO Days 1-3, then 2 tabs PO Days 4-6, then 1 tab PO Day 7-9, then Half Tab PO Day 10-12, Disp: 20 tablet, Rfl: 0   triamcinolone ointment (KENALOG) 0.1 %, Apply 1 application. topically 2 (two) times daily., Disp: 90  g, Rfl: 1  Allergies  Allergen Reactions   Chloraprep One Step [Chlorhexidine Gluconate] Itching and Rash   Aspirin Other (See Comments)    Convulsions    Diclofenac Sodium Other (See Comments)   Golimumab     Other reaction(s): hives   Adhesive [Tape] Itching and Rash    Assessment and Plan:   Diagnosis: Thrush, yeast vaginitis, bronchitis. Please see myChart communication and orders below.   No orders of the defined types were placed in this encounter.  Meds ordered this encounter  Medications   fluconazole (DIFLUCAN) 100 MG tablet    Sig: Take 2 tabs on day 1 and then one tab daily for the rest of the week    Dispense:  8 tablet     Refill:  0   albuterol (VENTOLIN HFA) 108 (90 Base) MCG/ACT inhaler    Sig: Inhale 1-2 puffs into the lungs every 6 (six) hours as needed for wheezing or shortness of breath.    Dispense:  8 g    Refill:  0    [name], MD 02/19/2022  A total of 6 minutes were spent by me to personally review the patient-generated inquiry, review patient records and data pertinent to assessment of the patient's problem, develop a management plan including generation of prescriptions and/or orders, and on subsequent communication with the patient through secure the MyChart portal service.   There is no separately reported E/M service related to this service in the past 7 days nor does the patient have an upcoming soonest available appointment for this issue. This work was completed in less than 7 days.   The patient consented to this service today (see patient agreement prior to ongoing communication). Patient counseled regarding the need for in-person exam for certain conditions and was advised to call the office if any changing or worsening symptoms occur.   The codes to be used for the E/M service are: '[x]'$   99421 for 5-10 minutes of time spent on the inquiry. '[]'$   [id number] for 11-20 minutes. '[]'$   [id number] for 21+ minutes.

## 2022-03-27 ENCOUNTER — Encounter: Payer: Self-pay | Admitting: Family Medicine

## 2022-03-27 NOTE — Telephone Encounter (Signed)
Patient was seen 02/10/22 given legnth since visit she should have another visit maybe in person to have an exam?   Please advise

## 2022-04-07 ENCOUNTER — Other Ambulatory Visit: Payer: Self-pay

## 2022-04-07 DIAGNOSIS — B9689 Other specified bacterial agents as the cause of diseases classified elsewhere: Secondary | ICD-10-CM

## 2022-04-07 MED ORDER — ALBUTEROL SULFATE HFA 108 (90 BASE) MCG/ACT IN AERS: 1.0000 | INHALATION_SPRAY | Freq: Four times a day (QID) | RESPIRATORY_TRACT | 0 refills | Status: AC | PRN

## 2022-04-11 ENCOUNTER — Encounter: Payer: Self-pay | Admitting: Family Medicine

## 2022-04-11 ENCOUNTER — Ambulatory Visit (INDEPENDENT_AMBULATORY_CARE_PROVIDER_SITE_OTHER): Payer: 59 | Admitting: Family Medicine

## 2022-04-11 ENCOUNTER — Ambulatory Visit (HOSPITAL_BASED_OUTPATIENT_CLINIC_OR_DEPARTMENT_OTHER)
Admission: RE | Admit: 2022-04-11 | Discharge: 2022-04-11 | Disposition: A | Discharge status: Home / Self Care | Payer: 59 | Source: Ambulatory Visit | Attending: Family Medicine | Admitting: Family Medicine

## 2022-04-11 VITALS — BP 122/78 | HR 81 | Temp 97.3°F | Ht 60.0 in | Wt 138.8 lb

## 2022-04-11 DIAGNOSIS — R9389 Abnormal findings on diagnostic imaging of other specified body structures: Secondary | ICD-10-CM | POA: Insufficient documentation

## 2022-04-11 DIAGNOSIS — J40 Bronchitis, not specified as acute or chronic: Secondary | ICD-10-CM

## 2022-04-11 MED ORDER — PREDNISONE 10 MG PO TABS: ORAL_TABLET | ORAL | 0 refills | Status: AC

## 2022-04-11 MED ORDER — AZITHROMYCIN 250 MG PO TABS: ORAL_TABLET | ORAL | 0 refills | Status: AC

## 2022-04-11 MED ORDER — BUDESONIDE-FORMOTEROL FUMARATE 80-4.5 MCG/ACT IN AERO
2.0000 | INHALATION_SPRAY | Freq: Two times a day (BID) | RESPIRATORY_TRACT | 1 refills | Status: DC
Start: 2022-04-11 — End: 2022-05-19

## 2022-04-11 MED ORDER — FLUCONAZOLE 100 MG PO TABS: ORAL_TABLET | ORAL | 0 refills | Status: AC

## 2022-04-11 MED ORDER — METHYLPREDNISOLONE SODIUM SUCC 40 MG IJ SOLR
40.0000 mg | Freq: Once | INTRAMUSCULAR | Status: AC
  Administered 2022-04-11: 40 mg via INTRAMUSCULAR

## 2022-04-11 NOTE — Progress Notes (Signed)
   Subjective:    Patient ID: Dawn Hayes, female    DOB: 02-21-1971, 51 y.o.   MRN: [id number]  HPI Cough- pt had a video visit on 10/2 and was dx'd w/ sinusitis and bronchitis.  Was treated w/ 10 days of Augmentin, Prednisone taper, albuterol (both inhaler and nebulizer).  Cough continues.  Pt reports other sxs have improved but continues to have a wet, hacking cough.  Unable to talk or laugh w/o coughing.  Cough is mildly productive.  + SOB w/ coughing or exertion.  No known sick contacts.  Last prednisone was mid-October.  + thrush from albuterol use   Review of Systems For ROS see HPI     Objective:   Physical Exam Vitals reviewed.  Constitutional:      General: She is not in acute distress.    Appearance: Normal appearance. She is well-developed. She is not ill-appearing.  HENT:     Head: Normocephalic and atraumatic.  Eyes:     Conjunctiva/sclera: Conjunctivae normal.     Pupils: Pupils are equal, round, and reactive to light.  Cardiovascular:     Rate and Rhythm: Normal rate and regular rhythm.     Heart sounds: Normal heart sounds. No murmur heard. Pulmonary:     Effort: Pulmonary effort is normal. No respiratory distress.     Breath sounds: Normal breath sounds. No wheezing.     Comments: Near continuous cough Musculoskeletal:     Cervical back: Normal range of motion and neck supple.  Lymphadenopathy:     Cervical: No cervical adenopathy.  Skin:    General: Skin is warm and dry.  Neurological:     General: No focal deficit present.     Mental Status: She is alert and oriented to person, place, and time.  Psychiatric:        Mood and Affect: Mood normal.        Behavior: Behavior normal.        Thought Content: Thought content normal.           Assessment & Plan:  Bronchitis- new.  Pt had a video visit on 10/2 and was dx'd w/ bronchitis and sinusitis.  Treated w/ Augmentin, Prednisone, Albuterol.  She reports other sxs have resolved but wet, hacking  cough continues.  When coughing, she develops SOB.  She also has developed thrush from Albuterol use.  Will tx w/ Zpack for anti-inflammatory properties and its coverage for atypical infxns.  Repeat prednisone taper.  Add Symbicort until feeling better.  Prescription for Diflucan given to treat oral thrush.  Reviewed supportive care and red flags that should prompt return.  Pt expressed understanding and is in agreement w/ plan.   Abnormal finding on CXR- pt had imaging done on 8/29 and was found to have 'an oval nodule projecting over the RUL on AP view, may be external to the patient'.  Will repeat CXR today to determine if that was external or something persistent on imaging that needs to be worked up.

## 2022-04-11 NOTE — Patient Instructions (Signed)
Follow up as needed or as scheduled START the Zpack as directed START the Prednisone tomorrow- take w/ food ADD the Symbicort- 2 puffs twice daily- until feeling better USE the Albuterol as needed for shortness of breath TAKE the Diflucan to help w/ the thrush Call with any questions or concerns Hang in there!!!

## 2022-04-14 ENCOUNTER — Telehealth: Payer: Self-pay

## 2022-04-14 NOTE — Telephone Encounter (Signed)
Called and LM

## 2022-04-14 NOTE — Telephone Encounter (Signed)
-----   Message from Midge Minium, MD sent at 04/14/2022  7:44 AM EST ----- Normal chest xray.  That area that was seen this summer is no longer present.  Great news!

## 2022-04-15 ENCOUNTER — Encounter: Payer: Self-pay | Admitting: Family Medicine

## 2022-04-15 NOTE — Telephone Encounter (Signed)
Called and LM asking her to return the call for result or message [place] on MyChart letting us know she has received that

## 2022-04-22 ENCOUNTER — Encounter: Payer: Self-pay | Admitting: Family Medicine

## 2022-04-28 ENCOUNTER — Encounter: Payer: Self-pay | Admitting: Family Medicine

## 2022-04-29 ENCOUNTER — Encounter: Payer: Self-pay | Admitting: Family Medicine

## 2022-04-29 NOTE — Telephone Encounter (Signed)
Please advise 

## 2022-05-17 ENCOUNTER — Other Ambulatory Visit: Payer: Self-pay | Admitting: Family Medicine

## 2022-05-22 ENCOUNTER — Telehealth: Payer: Self-pay

## 2022-05-22 NOTE — Telephone Encounter (Signed)
Pharmacy states pt Rx for symbicort 88 mcg needs a PA

## 2022-05-23 ENCOUNTER — Other Ambulatory Visit (HOSPITAL_COMMUNITY): Payer: Self-pay

## 2022-05-23 ENCOUNTER — Encounter: Payer: Self-pay | Admitting: Oncology

## 2022-05-27 ENCOUNTER — Other Ambulatory Visit (HOSPITAL_COMMUNITY): Payer: Self-pay

## 2022-05-29 ENCOUNTER — Telehealth: Payer: Self-pay

## 2022-05-29 NOTE — Telephone Encounter (Signed)
Pharmacy states the Rx Budesnide /Symbicort 80 mcg is not covered by pt insurance . Alternative medications include Wixela,Breo can we change ?

## 2022-05-29 NOTE — Telephone Encounter (Signed)
She has switched to [facility].  They will need to decide how to proceed

## 2022-05-29 NOTE — Telephone Encounter (Signed)
Noted  

## 2022-07-10 ENCOUNTER — Ambulatory Visit (HOSPITAL_BASED_OUTPATIENT_CLINIC_OR_DEPARTMENT_OTHER)
Admission: RE | Admit: 2022-07-10 | Discharge: 2022-07-10 | Disposition: A | Discharge status: Home / Self Care | Payer: 59 | Source: Ambulatory Visit | Attending: Anesthesiology | Admitting: Anesthesiology

## 2022-07-10 ENCOUNTER — Other Ambulatory Visit (HOSPITAL_BASED_OUTPATIENT_CLINIC_OR_DEPARTMENT_OTHER): Payer: Self-pay | Admitting: Anesthesiology

## 2022-07-10 DIAGNOSIS — M545 Low back pain, unspecified: Secondary | ICD-10-CM

## 2022-08-14 ENCOUNTER — Encounter (HOSPITAL_COMMUNITY): Payer: Self-pay | Admitting: *Deleted

## 2023-01-06 ENCOUNTER — Encounter (INDEPENDENT_AMBULATORY_CARE_PROVIDER_SITE_OTHER): Payer: Self-pay

## 2023-08-17 ENCOUNTER — Other Ambulatory Visit: Payer: Self-pay | Admitting: Adult Health Nurse Practitioner

## 2023-08-17 DIAGNOSIS — K769 Liver disease, unspecified: Secondary | ICD-10-CM

## 2023-08-21 ENCOUNTER — Encounter (HOSPITAL_COMMUNITY): Payer: Self-pay | Admitting: *Deleted

## 2023-08-21 ENCOUNTER — Other Ambulatory Visit: Payer: Self-pay | Admitting: Adult Health Nurse Practitioner

## 2023-08-21 DIAGNOSIS — K769 Liver disease, unspecified: Secondary | ICD-10-CM

## 2023-09-10 ENCOUNTER — Inpatient Hospital Stay: Admission: RE | Admit: 2023-09-10 | Source: Ambulatory Visit

## 2023-09-10 ENCOUNTER — Other Ambulatory Visit

## 2023-09-15 ENCOUNTER — Ambulatory Visit
Admission: RE | Admit: 2023-09-15 | Discharge: 2023-09-15 | Disposition: A | Discharge status: Home / Self Care | Source: Ambulatory Visit | Attending: Adult Health Nurse Practitioner | Admitting: Adult Health Nurse Practitioner

## 2023-09-15 ENCOUNTER — Encounter: Payer: Self-pay | Admitting: Oncology

## 2023-09-15 DIAGNOSIS — K769 Liver disease, unspecified: Secondary | ICD-10-CM

## 2023-09-15 MED ORDER — GADOPICLENOL 0.5 MMOL/ML IV SOLN
6.0000 mL | Freq: Once | INTRAVENOUS | Status: AC | PRN
  Administered 2023-09-15: 6 mL via INTRAVENOUS
